# Patient Record
Sex: Male | Born: 1940 | Hispanic: No | Marital: Married | Smoking: Never smoker
Health system: Southern US, Community
[De-identification: ages and names within clinical notes are randomized; demographics above are authoritative.]

## PROBLEM LIST (undated history)

## (undated) DIAGNOSIS — I219 Acute myocardial infarction, unspecified: Secondary | ICD-10-CM

## (undated) DIAGNOSIS — Z79899 Other long term (current) drug therapy: Secondary | ICD-10-CM

## (undated) DIAGNOSIS — N400 Enlarged prostate without lower urinary tract symptoms: Secondary | ICD-10-CM

## (undated) DIAGNOSIS — I2510 Atherosclerotic heart disease of native coronary artery without angina pectoris: Secondary | ICD-10-CM

## (undated) DIAGNOSIS — I429 Cardiomyopathy, unspecified: Secondary | ICD-10-CM

## (undated) DIAGNOSIS — I447 Left bundle-branch block, unspecified: Secondary | ICD-10-CM

## (undated) DIAGNOSIS — I480 Paroxysmal atrial fibrillation: Secondary | ICD-10-CM

## (undated) DIAGNOSIS — I5020 Unspecified systolic (congestive) heart failure: Secondary | ICD-10-CM

## (undated) DIAGNOSIS — M1990 Unspecified osteoarthritis, unspecified site: Secondary | ICD-10-CM

## (undated) DIAGNOSIS — Z8616 Personal history of COVID-19: Secondary | ICD-10-CM

## (undated) DIAGNOSIS — C679 Malignant neoplasm of bladder, unspecified: Secondary | ICD-10-CM

## (undated) DIAGNOSIS — H269 Unspecified cataract: Secondary | ICD-10-CM

## (undated) DIAGNOSIS — M1712 Unilateral primary osteoarthritis, left knee: Secondary | ICD-10-CM

## (undated) DIAGNOSIS — E039 Hypothyroidism, unspecified: Secondary | ICD-10-CM

## (undated) DIAGNOSIS — I509 Heart failure, unspecified: Secondary | ICD-10-CM

## (undated) DIAGNOSIS — I10 Essential (primary) hypertension: Secondary | ICD-10-CM

## (undated) DIAGNOSIS — R600 Localized edema: Secondary | ICD-10-CM

## (undated) DIAGNOSIS — F32A Depression, unspecified: Secondary | ICD-10-CM

## (undated) DIAGNOSIS — R001 Bradycardia, unspecified: Secondary | ICD-10-CM

## (undated) DIAGNOSIS — E785 Hyperlipidemia, unspecified: Secondary | ICD-10-CM

## (undated) DIAGNOSIS — R609 Edema, unspecified: Secondary | ICD-10-CM

## (undated) DIAGNOSIS — I700 Atherosclerosis of aorta: Secondary | ICD-10-CM

## (undated) DIAGNOSIS — K219 Gastro-esophageal reflux disease without esophagitis: Secondary | ICD-10-CM

## (undated) DIAGNOSIS — G479 Sleep disorder, unspecified: Secondary | ICD-10-CM

## (undated) DIAGNOSIS — E079 Disorder of thyroid, unspecified: Secondary | ICD-10-CM

## (undated) HISTORY — PX: TONSILLECTOMY: SUR1361

## (undated) HISTORY — DX: Unilateral primary osteoarthritis, left knee: M17.12

## (undated) HISTORY — DX: Heart failure, unspecified: I50.9

---

## 2005-08-23 ENCOUNTER — Ambulatory Visit: Payer: Self-pay | Admitting: Internal Medicine

## 2006-09-04 ENCOUNTER — Encounter (INDEPENDENT_AMBULATORY_CARE_PROVIDER_SITE_OTHER): Payer: Self-pay | Admitting: *Deleted

## 2006-09-04 ENCOUNTER — Ambulatory Visit (HOSPITAL_BASED_OUTPATIENT_CLINIC_OR_DEPARTMENT_OTHER): Admission: RE | Admit: 2006-09-04 | Discharge: 2006-09-04 | Payer: Self-pay | Admitting: Urology

## 2006-09-07 ENCOUNTER — Emergency Department (HOSPITAL_COMMUNITY): Admission: EM | Admit: 2006-09-07 | Discharge: 2006-09-07 | Payer: Self-pay | Admitting: Emergency Medicine

## 2006-12-19 DIAGNOSIS — C679 Malignant neoplasm of bladder, unspecified: Secondary | ICD-10-CM

## 2006-12-19 HISTORY — DX: Malignant neoplasm of bladder, unspecified: C67.9

## 2007-07-14 ENCOUNTER — Emergency Department: Payer: Self-pay | Admitting: Emergency Medicine

## 2007-07-14 ENCOUNTER — Other Ambulatory Visit: Payer: Self-pay

## 2007-09-07 ENCOUNTER — Ambulatory Visit (HOSPITAL_BASED_OUTPATIENT_CLINIC_OR_DEPARTMENT_OTHER): Admission: RE | Admit: 2007-09-07 | Discharge: 2007-09-07 | Payer: Self-pay | Admitting: Urology

## 2007-09-07 ENCOUNTER — Encounter (INDEPENDENT_AMBULATORY_CARE_PROVIDER_SITE_OTHER): Payer: Self-pay | Admitting: Urology

## 2008-01-03 ENCOUNTER — Ambulatory Visit: Payer: Self-pay | Admitting: Gastroenterology

## 2008-04-14 ENCOUNTER — Ambulatory Visit: Payer: Self-pay

## 2011-05-03 NOTE — Op Note (Signed)
Nathaniel Ibarra, Nathaniel Ibarra             ACCOUNT NO.:  [id number]   MEDICAL RECORD NO.:  [id number]          PATIENT TYPE:  AMB   LOCATION:  [facility]                         FACILITY:  [facility]   PHYSICIAN:  Sigmund I. Patsi Sears, M.D.DATE OF BIRTH:  [birth year]   DATE OF PROCEDURE:  09/07/2007  DATE OF DISCHARGE:                               OPERATIVE REPORT   PREOPERATIVE DIAGNOSIS:  Elevated PSA.   POSTOPERATIVE DIAGNOSIS:  Elevated PSA.   PROCEDURE PERFORMED:  Saturation biopsy with transrectal ultrasound.   SURGEON:  Sigmund I. Patsi Sears, M.D.   ASSISTANT:  Tarri Glenn, M.D.   ANESTHESIA:  General.   INDICATIONS FOR PROCEDURE:  Nathaniel Ibarra is a 70 year old male with an  elevated PSA.  His most recent measurement is 5.5.  He has had negative  biopsies.  He presents for saturation biopsy.   DESCRIPTION OF PROCEDURE:  The patient was brought to the operating  room.  He was identified by his arm band, consent was verified, and  preoperative time out was performed.  After the induction of anesthesia,  he was placed in the dorsal lithotomy position.  Perioperative  antibiotics were administered.  The scrotum was tacked to the inguinal  region exposing the perineum which was prepped.  A Foley catheter was  inserted and the bladder was drained and then it was clamped.  The  perineum was prepped and draped in usual fashion.  The brachytherapy  grid was then mounted to the table and placed flush against the  patient's perineum.  The transrectal ultrasound probe was inserted into  the patient's rectum.  I then used a Tru-Cut biopsy to take systematic  saturation biopsy.  This was done in an anterior and a posterior plane  and from both right and left sides, we took a lateral base, base,  lateral mid, mid, lateral apex, apex.  This resulted in 24 total  biopsies.  These were all sent separately for permanent pathology.  At  this time, the procedure was terminated.  The patient tolerated  the  procedure well and there were no complication.  Dr. Jethro Bolus  was the attending primary and responsible physician and was present and  participated in all aspects.     ______________________________  Tarri Glenn, M.D.      Sigmund I. Patsi Sears, M.D.  Electronically Signed   JR/MEDQ  D:  09/07/2007  T:  09/08/2007  Job:  [id number]

## 2011-05-06 NOTE — Op Note (Signed)
Nathaniel Ibarra, Nathaniel Ibarra             ACCOUNT NO.:  [id number]   MEDICAL RECORD NO.:  [id number]          PATIENT TYPE:  AMB   LOCATION:  [facility]                         FACILITY:  [facility]   PHYSICIAN:  Sigmund I. Patsi Sears, M.D.DATE OF BIRTH:  1941-05-15   DATE OF PROCEDURE:  09/04/2006  DATE OF DISCHARGE:                                 OPERATIVE REPORT   PREOPERATIVE DIAGNOSES:  1. Minimal bladder cancer with microhematuria.  2. PSA elevation.   OPERATIONS:  Cystourethroscopy, transurethral resection of bladder tumor,  and transurethral needle biopsy of the prostate.   SURGEON:  Dr. Patsi Sears.   ANESTHESIA:  General LMA.   PREPARATION:  Appropriate preanesthesia, the patient was brought to the  operating room, placed on the operating room in dorsal supine position where  general LMA anesthesia was introduced.  The patient was placed in left  lateral decubitus position, where the prostate was ultrasounded and  biopsied.  The ultrasound showed the patient's prostate size to be 90 mL.  Rectal wall and seminal vesicles were negative for tumor involvement.   Twelve pieces of tissue were taken and sent to laboratory for evaluation.  No bleeding was noted.   It is noted the patient's PSA has elevated from 4.5 in 2005, to 5.2 in 2006,  to 5.5 in 2007.  He has had negative prostate biopsies in the past.   The patient was then repositioned in the dorsal lithotomy position where the  pubis was prepped with Betadine solution and draped in usual fashion.   REVIEW OF HISTORY:  Shows that this patient was originally evaluated for  elevated PSA, but during this evaluation, cystourethroscopy was  accomplished, and was found to have right lateral bladder wall bladder  cancer.  He is now for TURBT.   PROCEDURE:  Cystourethroscopy was accomplished, the right lateral bladder  wall tumor was identified normal.  Transurethral resection of bladder wall  bladder tumor was accomplished, by pressing  down on the bladder off from  above, and adjusting the inflow and suction on the continuous flow scope to  bring the upper right lateral wall into  position for resection.  No bleeding was noted.  Because of a rather large  area of resection, a #22 Foley with 5 mL balloon was placed for 24 hours.  The catheter should be removed tomorrow morning.  The patient will hold his  aspirin.      Sigmund I. Patsi Sears, M.D.  Electronically Signed     SIT/MEDQ  D:  09/04/2006  T:  09/05/2006  Job:  [id number]

## 2011-09-29 LAB — I-STAT 8, (EC8 V) (CONVERTED LAB)
Acid-Base Excess: 1
Bicarbonate: 25.3 — ABNORMAL HIGH
HCT: 46
Operator id: 114531
Sodium: 139
pCO2, Ven: 38.7 — ABNORMAL LOW

## 2014-12-19 DIAGNOSIS — I214 Non-ST elevation (NSTEMI) myocardial infarction: Secondary | ICD-10-CM

## 2014-12-19 HISTORY — DX: Non-ST elevation (NSTEMI) myocardial infarction: I21.4

## 2015-08-16 ENCOUNTER — Observation Stay
Admission: EM | Admit: 2015-08-16 | Discharge: 2015-08-17 | Disposition: A | Discharge status: Home / Self Care | Payer: PPO | Attending: Internal Medicine | Admitting: Internal Medicine

## 2015-08-16 ENCOUNTER — Encounter: Payer: Self-pay | Admitting: Emergency Medicine

## 2015-08-16 ENCOUNTER — Emergency Department: Payer: PPO

## 2015-08-16 DIAGNOSIS — Z79899 Other long term (current) drug therapy: Secondary | ICD-10-CM | POA: Diagnosis not present

## 2015-08-16 DIAGNOSIS — R61 Generalized hyperhidrosis: Secondary | ICD-10-CM | POA: Insufficient documentation

## 2015-08-16 DIAGNOSIS — I10 Essential (primary) hypertension: Secondary | ICD-10-CM | POA: Insufficient documentation

## 2015-08-16 DIAGNOSIS — E039 Hypothyroidism, unspecified: Secondary | ICD-10-CM | POA: Insufficient documentation

## 2015-08-16 DIAGNOSIS — I071 Rheumatic tricuspid insufficiency: Secondary | ICD-10-CM | POA: Insufficient documentation

## 2015-08-16 DIAGNOSIS — R079 Chest pain, unspecified: Secondary | ICD-10-CM | POA: Diagnosis present

## 2015-08-16 DIAGNOSIS — I371 Nonrheumatic pulmonary valve insufficiency: Secondary | ICD-10-CM | POA: Diagnosis not present

## 2015-08-16 DIAGNOSIS — Z8551 Personal history of malignant neoplasm of bladder: Secondary | ICD-10-CM | POA: Diagnosis not present

## 2015-08-16 DIAGNOSIS — N400 Enlarged prostate without lower urinary tract symptoms: Secondary | ICD-10-CM | POA: Insufficient documentation

## 2015-08-16 DIAGNOSIS — Z7982 Long term (current) use of aspirin: Secondary | ICD-10-CM | POA: Diagnosis not present

## 2015-08-16 DIAGNOSIS — I447 Left bundle-branch block, unspecified: Secondary | ICD-10-CM | POA: Diagnosis present

## 2015-08-16 DIAGNOSIS — Z825 Family history of asthma and other chronic lower respiratory diseases: Secondary | ICD-10-CM | POA: Diagnosis not present

## 2015-08-16 HISTORY — DX: Essential (primary) hypertension: I10

## 2015-08-16 HISTORY — DX: Disorder of thyroid, unspecified: E07.9

## 2015-08-16 HISTORY — DX: Malignant neoplasm of bladder, unspecified: C67.9

## 2015-08-16 HISTORY — DX: Benign prostatic hyperplasia without lower urinary tract symptoms: N40.0

## 2015-08-16 LAB — CBC
HEMATOCRIT: 42.5 % (ref 40.0–52.0)
HEMOGLOBIN: 14.4 g/dL (ref 13.0–18.0)
MCH: 31.6 pg (ref 26.0–34.0)
MCHC: 33.9 g/dL (ref 32.0–36.0)
MCV: 93.3 fL (ref 80.0–100.0)
Platelets: 320 10*3/uL (ref 150–440)
RBC: 4.56 MIL/uL (ref 4.40–5.90)
RDW: 12.7 % (ref 11.5–14.5)
WBC: 7.8 10*3/uL (ref 3.8–10.6)

## 2015-08-16 LAB — BASIC METABOLIC PANEL
ANION GAP: 7 (ref 5–15)
BUN: 20 mg/dL (ref 6–20)
CHLORIDE: 108 mmol/L (ref 101–111)
CO2: 26 mmol/L (ref 22–32)
Calcium: 8.7 mg/dL — ABNORMAL LOW (ref 8.9–10.3)
Creatinine, Ser: 0.83 mg/dL (ref 0.61–1.24)
GFR calc non Af Amer: >60 mL/min (ref >60)
Glucose, Bld: 127 mg/dL — ABNORMAL HIGH (ref 65–99)
POTASSIUM: 3.6 mmol/L (ref 3.5–5.1)
SODIUM: 141 mmol/L (ref 135–145)

## 2015-08-16 LAB — TROPONIN I: Troponin I: <0.03 ng/mL (ref <0.031)

## 2015-08-16 MED ORDER — ONDANSETRON HCL 4 MG PO TABS: 4.0000 mg | ORAL_TABLET | Freq: Four times a day (QID) | ORAL | Status: DC | PRN

## 2015-08-16 MED ORDER — NITROGLYCERIN 0.4 MG SL SUBL: 0.4000 mg | SUBLINGUAL_TABLET | SUBLINGUAL | Status: DC | PRN

## 2015-08-16 MED ORDER — MORPHINE SULFATE (PF) 2 MG/ML IV SOLN: 2.0000 mg | INTRAVENOUS | Status: DC | PRN

## 2015-08-16 MED ORDER — ASPIRIN EC 81 MG PO TBEC
81.0000 mg | DELAYED_RELEASE_TABLET | Freq: Every day | ORAL | Status: DC
  Administered 2015-08-17: 81 mg via ORAL
  Filled 2015-08-16: qty 1

## 2015-08-16 MED ORDER — FINASTERIDE 5 MG PO TABS
5.0000 mg | ORAL_TABLET | Freq: Every day | ORAL | Status: DC
  Administered 2015-08-17: 5 mg via ORAL
  Filled 2015-08-16: qty 1

## 2015-08-16 MED ORDER — ASPIRIN 81 MG PO CHEW
324.0000 mg | CHEWABLE_TABLET | Freq: Once | ORAL | Status: AC
  Administered 2015-08-16: 324 mg via ORAL
  Filled 2015-08-16: qty 4

## 2015-08-16 MED ORDER — ACETAMINOPHEN 325 MG PO TABS
650.0000 mg | ORAL_TABLET | Freq: Four times a day (QID) | ORAL | Status: DC | PRN
  Administered 2015-08-17: 650 mg via ORAL
  Filled 2015-08-16: qty 2

## 2015-08-16 MED ORDER — LEVOTHYROXINE SODIUM 50 MCG PO TABS
50.0000 ug | ORAL_TABLET | ORAL | Status: DC
  Administered 2015-08-17: 50 ug via ORAL
  Filled 2015-08-16: qty 1

## 2015-08-16 MED ORDER — LOSARTAN POTASSIUM 25 MG PO TABS
25.0000 mg | ORAL_TABLET | Freq: Every day | ORAL | Status: DC
  Administered 2015-08-17: 25 mg via ORAL
  Filled 2015-08-16: qty 1

## 2015-08-16 MED ORDER — TAMSULOSIN HCL 0.4 MG PO CAPS
0.4000 mg | ORAL_CAPSULE | Freq: Every day | ORAL | Status: DC
  Administered 2015-08-17: 0.4 mg via ORAL
  Filled 2015-08-16: qty 1

## 2015-08-16 MED ORDER — INFLUENZA VAC SPLIT QUAD 0.5 ML IM SUSY: 0.5000 mL | PREFILLED_SYRINGE | INTRAMUSCULAR | Status: DC

## 2015-08-16 MED ORDER — ENOXAPARIN SODIUM 40 MG/0.4ML ~~LOC~~ SOLN
40.0000 mg | SUBCUTANEOUS | Status: DC
Start: 2015-08-16 — End: 2015-08-17
  Administered 2015-08-16: 40 mg via SUBCUTANEOUS
  Filled 2015-08-16: qty 0.4

## 2015-08-16 MED ORDER — NITROGLYCERIN 2 % TD OINT
1.0000 [in_us] | TOPICAL_OINTMENT | Freq: Once | TRANSDERMAL | Status: AC
  Administered 2015-08-16: 1 [in_us] via TOPICAL
  Filled 2015-08-16: qty 1

## 2015-08-16 MED ORDER — AMLODIPINE BESYLATE 5 MG PO TABS
5.0000 mg | ORAL_TABLET | Freq: Every day | ORAL | Status: DC
  Administered 2015-08-17: 5 mg via ORAL
  Filled 2015-08-16: qty 1

## 2015-08-16 MED ORDER — ACETAMINOPHEN 650 MG RE SUPP: 650.0000 mg | Freq: Four times a day (QID) | RECTAL | Status: DC | PRN

## 2015-08-16 MED ORDER — ONDANSETRON HCL 4 MG/2ML IJ SOLN: 4.0000 mg | Freq: Four times a day (QID) | INTRAMUSCULAR | Status: DC | PRN

## 2015-08-16 MED ORDER — PNEUMOCOCCAL VAC POLYVALENT 25 MCG/0.5ML IJ INJ: 0.5000 mL | INJECTION | INTRAMUSCULAR | Status: DC

## 2015-08-16 MED ORDER — SERTRALINE HCL 50 MG PO TABS
25.0000 mg | ORAL_TABLET | Freq: Every day | ORAL | Status: DC
  Administered 2015-08-17: 25 mg via ORAL
  Filled 2015-08-16: qty 1

## 2015-08-16 MED ORDER — SODIUM CHLORIDE 0.9 % IJ SOLN
3.0000 mL | Freq: Two times a day (BID) | INTRAMUSCULAR | Status: DC
  Administered 2015-08-16 – 2015-08-17 (×2): 3 mL via INTRAVENOUS

## 2015-08-16 NOTE — ED Notes (Signed)
States he developed pain to mid chest about 1 weeks ago   Pain increased with inspiration and palpation

## 2015-08-16 NOTE — ED Provider Notes (Signed)
[facility] Emergency Department Provider Note     Time seen: ----------------------------------------- 1:51 PM on 08/16/2015 -----------------------------------------    I have reviewed the triage vital signs and the nursing notes.   HISTORY  Chief Complaint Chest Pain    HPI Nathaniel Ibarra is a 74 y.o. male who presents ER with mild left-sided chest pain is started about a week ago. Patient thinks it was maybe from lifting a calf. Patient describes pain with inspiration and palpation of the chest. Sometimes the pain is aggravated by pulling things with his left arm. At times he felt like he couldn't breathe very well and that he was flushed. He has not had this happen to him before. He has never had a heart attack   Past Medical History  Diagnosis Date  . Hypertension   . Thyroid disease   . Cancer     There are no active problems to display for this patient.   History reviewed. No pertinent past surgical history.  Allergies Review of patient's allergies indicates no known allergies.  Social History Social History  Substance Use Topics  . Smoking status: Never Smoker   . Smokeless tobacco: None  . Alcohol Use: No    Review of Systems Constitutional: Negative for fever. Eyes: Negative for visual changes. ENT: Negative for sore throat. Cardiovascular: Positive for chest pain Respiratory: Positive for pain when he breathes Gastrointestinal: Negative for abdominal pain, vomiting and diarrhea. Genitourinary: Negative for dysuria. Musculoskeletal: Negative for back pain. Skin: Negative for rash. Neurological: Negative for headaches, focal weakness or numbness.  10-point ROS otherwise negative.  ____________________________________________   PHYSICAL EXAM:  VITAL SIGNS: ED Triage Vitals  Enc Vitals Group     BP 08/16/15 1249 134/64 mmHg     Pulse Rate 08/16/15 1249 65     Resp 08/16/15 1249 20     Temp 08/16/15 1249 98.1 F  (36.7 C)     Temp Source 08/16/15 1249 Oral     SpO2 08/16/15 1249 96 %     Weight 08/16/15 1249 200 lb (90.719 kg)     Height 08/16/15 1249 6' (1.829 m)     Head Cir --      Peak Flow --      Pain Score 08/16/15 1250 3     Pain Loc --      Pain Edu? --      Excl. in [place]? --     Constitutional: Alert and oriented. Well appearing and in no distress. Eyes: Conjunctivae are normal. PERRL. Normal extraocular movements. ENT   Head: Normocephalic and atraumatic.   Nose: No congestion/rhinnorhea.   Mouth/Throat: Mucous membranes are moist.   Neck: No stridor. Cardiovascular: Normal rate, regular rhythm. Normal and symmetric distal pulses are present in all extremities. No murmurs, rubs, or gallops. Respiratory: Normal respiratory effort without tachypnea nor retractions. Breath sounds are clear and equal bilaterally. No wheezes/rales/rhonchi. Gastrointestinal: Soft and nontender. No distention. No abdominal bruits.  Musculoskeletal: Nontender with normal range of motion in all extremities. No joint effusions.  No lower extremity tenderness nor edema. Neurologic:  Normal speech and language. No gross focal neurologic deficits are appreciated. Speech is normal. No gait instability. Skin:  Skin is warm, dry and intact. No rash noted. Psychiatric: Mood and affect are normal. Speech and behavior are normal. Patient exhibits appropriate insight and judgment. ____________________________________________  EKG: Interpreted by me. Normal sinus rhythm, left axis deviation, left bundle branch block. Rate is 65 bpm  ____________________________________________  ED COURSE:  Pertinent labs & imaging results that were available during my care of the patient were reviewed by me and considered in my medical decision making (see chart for details). Patient is in no acute distress, will check cardiac labs and chest x-ray. ____________________________________________    LABS (pertinent  positives/negatives)  Labs Reviewed  BASIC METABOLIC PANEL - Abnormal; Notable for the following:    Glucose, Bld 127 (*)    Calcium 8.7 (*)    All other components within normal limits  TROPONIN I  CBC    RADIOLOGY  Chest x-ray FINDINGS: The heart size and mediastinal contours are within normal limits. Both lungs are clear. The visualized skeletal structures are unremarkable.  IMPRESSION: No active cardiopulmonary disease. ____________________________________________  FINAL ASSESSMENT AND PLAN  Chest pain, new left bundle branch block  Plan: Patient with labs and imaging as dictated above. Patient does describe exertional symptoms. He states "here lately" he has chest pain and difficulty breathing whenever he exerts himself heavily. The symptoms in conjunction with his new left bundle branch block will be worrisome for acute coronary syndrome or unstable angina. He'll need to be admitted and monitored with likely stress testing tomorrow.   Earleen Newport, MD   Earleen Newport, MD 08/16/15 [phone]

## 2015-08-16 NOTE — H&P (Signed)
[place] [facility] [place] NAME: Nathaniel Ibarra    MR#:  [id number]  DATE OF BIRTH:  1940-12-25  DATE OF ADMISSION:  08/16/2015  PRIMARY CARE PHYSICIAN: Albina Billet, MD   REQUESTING/REFERRING PHYSICIAN: Dr. Lenise Arena  CHIEF COMPLAINT:   Chief Complaint  Patient presents with  . Chest Pain    HISTORY OF PRESENT ILLNESS:  Nathaniel Ibarra  is a 74 y.o. male with a known history of hypertension, bladder cancer, hypothyroidism, BPH who presents to the hospital with chest pain ongoing for the past week. He describes his chest pain as being worse with deep inspiration and also with moving his left arm. It is nonradiating and not associated with shortness of breath. Patient did so that a few times she became diaphoretic with the chest pain. He denies any dizziness, palpitations, syncope. He presented to the ER and was noted to have an EKG showing a left bundle branch block. His previous EKG in 2008 did not show that. Patient cannot recall ever being told that he had abnormal EKG. Given his chest pain is new EKG findings hospitalist services were contacted further treatment and evaluation.  PAST MEDICAL HISTORY:   Past Medical History  Diagnosis Date  . Hypertension   . Thyroid disease   . BPH (benign prostatic hyperplasia)   . Bladder cancer     PAST SURGICAL HISTORY:  History reviewed. No pertinent past surgical history.  SOCIAL HISTORY:   Social History  Substance Use Topics  . Smoking status: Never Smoker   . Smokeless tobacco: Not on file  . Alcohol Use: No    FAMILY HISTORY:   Family History  Problem Relation Age of Onset  . Emphysema Mother   . Emphysema Father     DRUG ALLERGIES:  Not on File  REVIEW OF SYSTEMS:   Review of Systems  Constitutional: Negative for fever and weight loss.  HENT: Negative for congestion, nosebleeds and tinnitus.   Eyes: Negative for blurred vision, double vision and redness.   Respiratory: Negative for cough, hemoptysis and shortness of breath.   Cardiovascular: Positive for chest pain. Negative for orthopnea, leg swelling and PND.  Gastrointestinal: Negative for nausea, vomiting, abdominal pain, diarrhea and melena.  Genitourinary: Negative for dysuria, urgency and hematuria.  Musculoskeletal: Negative for joint pain and falls.  Neurological: Negative for dizziness, tingling, sensory change, focal weakness, seizures, weakness and headaches.  Endo/Heme/Allergies: Negative for polydipsia. Does not bruise/bleed easily.  Psychiatric/Behavioral: Negative for depression and memory loss. The patient is not nervous/anxious.     MEDICATIONS AT HOME:   Prior to Admission medications   Medication Sig Start Date End Date Taking? Authorizing Provider  amLODipine (NORVASC) 5 MG tablet Take 5 mg by mouth daily. 05/09/15  Yes Historical Provider, MD  aspirin 81 MG chewable tablet Chew 81 mg by mouth daily.   Yes Historical Provider, MD  finasteride (PROSCAR) 5 MG tablet Take 5 mg by mouth daily. 08/03/15  Yes Historical Provider, MD  levothyroxine (SYNTHROID, LEVOTHROID) 50 MCG tablet Take 50 mcg by mouth every morning. 07/12/15  Yes Historical Provider, MD  losartan (COZAAR) 50 MG tablet Take 25 mg by mouth daily.   Yes Historical Provider, MD  sertraline (ZOLOFT) 25 MG tablet Take 25 mg by mouth daily. 08/01/15  Yes Historical Provider, MD  tamsulosin (FLOMAX) 0.4 MG CAPS capsule Take 0.4 mg by mouth daily. 08/01/15  Yes Historical Provider, MD      VITAL SIGNS:  Blood  pressure 149/69, pulse 58, temperature 98.1 F (36.7 C), temperature source Oral, resp. rate 16, height 6' (1.829 m), weight 90.719 kg (200 lb), SpO2 94 %.  PHYSICAL EXAMINATION:  Physical Exam  GENERAL:  74 y.o.-year-old patient lying in the bed with no acute distress.  EYES: Pupils equal, round, reactive to light and accommodation. No scleral icterus. Extraocular muscles intact.  HEENT: Head atraumatic,  normocephalic. Oropharynx and nasopharynx clear. No oropharyngeal erythema, moist oral mucosa.    NECK:  Supple, no jugular venous distention. No thyroid enlargement, no tenderness.  LUNGS: Normal breath sounds bilaterally, no wheezing, rales, rhonchi. No use of accessory muscles of respiration.  CARDIOVASCULAR: S1, S2 RRR. No murmurs, rubs, gallops, clicks.  ABDOMEN: Soft, nontender, nondistended. Bowel sounds present. No organomegaly or mass.  EXTREMITIES: No pedal edema, cyanosis, or clubbing. + 2 pedal & radial pulses b/l.   NEUROLOGIC: Cranial nerves II through XII are intact. No focal Motor or sensory deficits appreciated b/l PSYCHIATRIC: The patient is alert and oriented x 3. Good affect.  SKIN: No obvious rash, lesion, or ulcer.   LABORATORY PANEL:   CBC  Recent Labs Lab 08/16/15 1304  WBC 7.8  HGB 14.4  HCT 42.5  PLT 320   ------------------------------------------------------------------------------------------------------------------  Chemistries   Recent Labs Lab 08/16/15 1304  NA 141  K 3.6  CL 108  CO2 26  GLUCOSE 127*  BUN 20  CREATININE 0.83  CALCIUM 8.7*   ------------------------------------------------------------------------------------------------------------------  Cardiac Enzymes  Recent Labs Lab 08/16/15 1304  TROPONINI <0.03   ------------------------------------------------------------------------------------------------------------------  RADIOLOGY:  Dg Chest 2 View  08/16/2015   CLINICAL DATA:  Chest pain for 1 week.  EXAM: CHEST  2 VIEW  COMPARISON:  07/14/2007  FINDINGS: The heart size and mediastinal contours are within normal limits. Both lungs are clear. The visualized skeletal structures are unremarkable.  IMPRESSION: No active cardiopulmonary disease.   Electronically Signed   By: Rolm Baptise M.D.   On: 08/16/2015 13:28     IMPRESSION AND PLAN:   74 year old male with past medical history of hypertension, BPH, bladder  cancer, follows him who presents to the hospital due to chest pain and noted to have a abnormal EKG with left bundle branch block.  #1 chest pain with abnormal EKG-patient does have risk factors given his history of hypertension and now that he is an abnormal EKG with a left bundle branch block. Patient denies ever being told that he had abnormal EKG. -Patient's chest pain although has not for atypical for angina as it's reproducible and worse with inspiration and movement. -For now I'll observe the patient on telemetry, follow also cardiac markers, get a two-dimensional echocardiogram, cardiology consult. -Continue aspirin, nitroglycerin as needed, check lipid profile.  #2 hypertension-continue Norvasc, losartan.  #3 BPH-continue Flomax, finasteride  #4 depression-continue Zoloft    All the records are reviewed and case discussed with ED provider. Management plans discussed with the patient, family and they are in agreement.  CODE STATUS: Full  TOTAL TIME TAKING CARE OF THIS PATIENT: 45 minutes.    Henreitta Leber M.D on 08/16/2015 at 3:18 PM  Between 7am to 6pm - Pager - [phone]  After 6pm go to [email or web address] - password [email or web address] [facility]  Office  [phone]  CC: Primary care physician; Albina Billet, MD

## 2015-08-16 NOTE — Progress Notes (Signed)
Patient arrived to 2A Room 237. Patient denies pain and all questions answered. Patient oriented to unit and Fall Safety Plan signed. Skin assessment completed with Elna Breslow RN. Nursing staff will continue to monitor. Earleen Reaper, RN

## 2015-08-17 ENCOUNTER — Observation Stay
Admit: 2015-08-17 | Discharge: 2015-08-17 | Disposition: A | Discharge status: Home / Self Care | Payer: PPO | Attending: Specialist | Admitting: Specialist

## 2015-08-17 LAB — BASIC METABOLIC PANEL
ANION GAP: 6 (ref 5–15)
BUN: 22 mg/dL — ABNORMAL HIGH (ref 6–20)
CO2: 26 mmol/L (ref 22–32)
Calcium: 8.5 mg/dL — ABNORMAL LOW (ref 8.9–10.3)
Chloride: 108 mmol/L (ref 101–111)
Creatinine, Ser: 0.95 mg/dL (ref 0.61–1.24)
GFR calc Af Amer: >60 mL/min (ref >60)
GLUCOSE: 149 mg/dL — AB (ref 65–99)
POTASSIUM: 3.6 mmol/L (ref 3.5–5.1)
Sodium: 140 mmol/L (ref 135–145)

## 2015-08-17 LAB — CBC
HCT: 38.8 % — ABNORMAL LOW (ref 40.0–52.0)
Hemoglobin: 13.1 g/dL (ref 13.0–18.0)
MCH: 31.5 pg (ref 26.0–34.0)
MCHC: 33.8 g/dL (ref 32.0–36.0)
MCV: 93.2 fL (ref 80.0–100.0)
PLATELETS: 281 10*3/uL (ref 150–440)
RBC: 4.16 MIL/uL — AB (ref 4.40–5.90)
RDW: 12.7 % (ref 11.5–14.5)
WBC: 7.9 10*3/uL (ref 3.8–10.6)

## 2015-08-17 LAB — LIPID PANEL
CHOLESTEROL: 158 mg/dL (ref 0–200)
HDL: 28 mg/dL — AB (ref >40)
LDL Cholesterol: 92 mg/dL (ref 0–99)
Total CHOL/HDL Ratio: 5.6 RATIO
Triglycerides: 188 mg/dL — ABNORMAL HIGH (ref <150)
VLDL: 38 mg/dL (ref 0–40)

## 2015-08-17 LAB — TROPONIN I: Troponin I: <0.03 ng/mL (ref <0.031)

## 2015-08-17 MED ORDER — IBUPROFEN 200 MG PO TABS: 200.0000 mg | ORAL_TABLET | Freq: Four times a day (QID) | ORAL | Status: DC | PRN

## 2015-08-17 NOTE — Care Management (Signed)
Patient admitted with chest pain.  Troponins are negative cardiology consult.   Patient presents from home and independent in all adls.   Has health insurance.  Denies issues accessing medical care, obtaining medications, maintaining housing, utilities and food.   No discharge needs identified at present time.

## 2015-08-17 NOTE — Progress Notes (Signed)
*  PRELIMINARY RESULTS* Echocardiogram 2D Echocardiogram has been performed.  Laqueta Jean Hege 08/17/2015, 8:36 AM

## 2015-08-17 NOTE — Consult Note (Signed)
History and Physical  Patient ID: Nathaniel Ibarra MRN: [id number] DOB/AGE: [birth year] 74 y.o. Admit date: 08/16/2015  Primary Care Physician: Albina Billet, MD Primary Cardiologist: none  HPI: This is a 74 YOm with PMHx HTN presented to ER with atypical CP, He states he was helping birth a calf last week and had to lift the animal. He felt sore hours afterward. He c/o pleuritic CP and chest wall is TTP.   Review of systems complete and found to be negative unless listed above  Past Medical History  Diagnosis Date  . Hypertension   . Thyroid disease   . BPH (benign prostatic hyperplasia)   . Bladder cancer     Family History  Problem Relation Age of Onset  . Emphysema Mother   . Emphysema Father     Social History   Social History  . Marital Status: Married    Spouse Name: N/A  . Number of Children: N/A  . Years of Education: N/A   Occupational History  . Not on file.   Social History Main Topics  . Smoking status: Never Smoker   . Smokeless tobacco: Not on file  . Alcohol Use: No  . Drug Use: No  . Sexual Activity: Not Currently   Other Topics Concern  . Not on file   Social History Narrative  . No narrative on file    History reviewed. No pertinent past surgical history.   Prescriptions prior to admission  Medication Sig Dispense Refill Last Dose  . amLODipine (NORVASC) 5 MG tablet Take 5 mg by mouth daily.  5 08/16/2015 at Unknown time  . aspirin 81 MG chewable tablet Chew 81 mg by mouth daily.   08/16/2015 at Unknown time  . finasteride (PROSCAR) 5 MG tablet Take 5 mg by mouth daily.  0 08/16/2015 at Unknown time  . levothyroxine (SYNTHROID, LEVOTHROID) 50 MCG tablet Take 50 mcg by mouth every morning.  3 08/16/2015 at Unknown time  . losartan (COZAAR) 50 MG tablet Take 25 mg by mouth daily.   08/16/2015 at Unknown time  . sertraline (ZOLOFT) 25 MG tablet Take 25 mg by mouth daily.  4 08/16/2015 at Unknown time  . tamsulosin (FLOMAX) 0.4 MG CAPS capsule Take  0.4 mg by mouth daily.  3 08/16/2015 at Unknown time    Physical Exam: Blood pressure 125/53, pulse 53, temperature 98.5 F (36.9 C), temperature source Oral, resp. rate 18, height 6' (1.829 m), weight 91.491 kg (201 lb 11.2 oz), SpO2 93 %.   General appearance: alert Labs:   Lab Results  Component Value Date   WBC 7.9 08/17/2015   HGB 13.1 08/17/2015   HCT 38.8* 08/17/2015   MCV 93.2 08/17/2015   PLT 281 08/17/2015    Recent Labs Lab 08/17/15 0105  NA 140  K 3.6  CL 108  CO2 26  BUN 22*  CREATININE 0.95  CALCIUM 8.5*  GLUCOSE 149*   Lab Results  Component Value Date   TROPONINI <0.03 08/17/2015    Lab Results  Component Value Date   CHOL 158 08/17/2015   Lab Results  Component Value Date   HDL 28* 08/17/2015   Lab Results  Component Value Date   LDLCALC 92 08/17/2015   Lab Results  Component Value Date   TRIG 188* 08/17/2015   Lab Results  Component Value Date   CHOLHDL 5.6 08/17/2015   No results found for: LDLDIRECT    Radiology: Dg Chest 2 View  08/16/2015   CLINICAL  DATA:  Chest pain for 1 week.  EXAM: CHEST  2 VIEW  COMPARISON:  07/14/2007  FINDINGS: The heart size and mediastinal contours are within normal limits. Both lungs are clear. The visualized skeletal structures are unremarkable.  IMPRESSION: No active cardiopulmonary disease.   Electronically Signed   By: Rolm Baptise M.D.   On: 08/16/2015 13:28    EKG: NSR 65 BPM LBBB  ASSESSMENT AND PLAN: pt with atypical CP, LBBB. EKG in 2008 LBBB was not present, concern is whether pt is having MI. However NSTEMI has r/o, LBBB is probably not related to acute MI, probably is due to conduction problem. Echo shows normal LVEF, but has septal hypokinesis which is probably due to LBBB. Thus advise outpatient NST which has been scheduled for 8/30 at 9am. Pt and pts wife are reliable to keep appt and can be discharged from cardiac stand point.   Signed: Dionisio David MD, [facility] 08/17/2015, 11:06 AM

## 2015-08-17 NOTE — Progress Notes (Signed)
Patient given discharge teaching and paperwork regarding medications, diet, follow-up appointments and activity. Patient understanding verbalized. No complaints at this time. IV and telemetry discontinued prior to leaving. Skin assessment as previously charted and vitals are stable; on room air. Patient being discharged to home. Wife present during discharge teaching. No further needs by Care Management. Ibuprofen e-prescribed.

## 2015-08-17 NOTE — Discharge Summary (Addendum)
Nathaniel Ibarra, is a 74 y.o. male  DOB 12-04-1941  MRN [id number].  Admission date:  08/16/2015  Admitting Physician  Henreitta Leber, MD  Discharge Date:  08/17/2015   Primary MD  Albina Billet, MD  Recommendations for primary care physician for things to follow:   Follow-up with Dr. Benita Stabile in 1 week.   Admission Diagnosis  Left bundle branch block [I44.7] Chest pain, unspecified chest pain type [R07.9]   Discharge Diagnosis  Left bundle branch block [I44.7] Chest pain, unspecified chest pain type [R07.9]  Active Problems:   Chest pain      Past Medical History  Diagnosis Date  . Hypertension   . Thyroid disease   . BPH (benign prostatic hyperplasia)   . Bladder cancer     History reviewed. No pertinent past surgical history.     History of present illness and  Hospital Course:     Kindly see H&P for history of present illness and admission details, please review complete Labs, Consult reports and Test reports for all details in brief  HPI  from the history and physical done on the day of admission  74 year old male patient with history of hypertension, bladder cancer, hypothyroidism came in because of for chest pain. Patient has no shortness of breath, but was diaphoretic. Patient EKG showed a left bundle branch block but the previous EKG was in 2008 after that he did not have any follow-ups. So we are not sure if these changes are old or new. Patient troponins are negative. Admitted to hospitalist service on telemetry for evaluation of chest pain.  Hospital Course    #1 chest pain; likely secondary to musculoskeletal pain patient says that he was lifting a calf and noticed pain after that. Troponins are negative, echocardiogram results are pending. Cardiology consult placed because of EKG showing  the left bundle branch block. Patient complains of chest pain only with deep breath, movement and it appears like muscular skeletal pain because of lifting the cow. Cardiology is planning to do a cardiac workup he'll be kept in hospital otherwise if this a patient can go home will discharge the patient home #2 hypertension controlled continue home medication #3 BPH continue Flomax History of hypothyroidism continue Synthyroid. Discharge Condition: Stable   Follow UP  Follow-up Information    Follow up with Albina Billet, MD.   Specialty:  Internal Medicine   Contact information:   [address]   [place] [address] [phone]         Discharge Instructions  and  Discharge Medications        Medication List    TAKE these medications        amLODipine 5 MG tablet  Commonly known as:  NORVASC  Take 5 mg by mouth daily.     aspirin 81 MG chewable tablet  Chew 81 mg by mouth daily.     finasteride 5 MG tablet  Commonly known as:  PROSCAR  Take 5 mg by mouth daily.     ibuprofen 200 MG tablet  Commonly known as:  MOTRIN IB  Take 1 tablet (200 mg total) by mouth every 6 (six) hours as needed.     levothyroxine 50 MCG tablet  Commonly known as:  SYNTHROID, LEVOTHROID  Take 50 mcg by mouth every morning.     losartan 50 MG tablet  Commonly known as:  COZAAR  Take 25 mg by mouth daily.     sertraline 25  MG tablet  Commonly known as:  ZOLOFT  Take 25 mg by mouth daily.     tamsulosin 0.4 MG Caps capsule  Commonly known as:  FLOMAX  Take 0.4 mg by mouth daily.          Diet and Activity recommendation: See Discharge Instructions above   Consults obtained - cardio   Major procedures and Radiology Reports - PLEASE review detailed and final reports for all details, in brief -      Dg Chest 2 View  08/16/2015   CLINICAL DATA:  Chest pain for 1 week.  EXAM: CHEST  2 VIEW  COMPARISON:  07/14/2007  FINDINGS: The heart size and mediastinal contours  are within normal limits. Both lungs are clear. The visualized skeletal structures are unremarkable.  IMPRESSION: No active cardiopulmonary disease.   Electronically Signed   By: Rolm Baptise M.D.   On: 08/16/2015 13:28    Micro Results     No results found for this or any previous visit (from the past 240 hour(s)).     Today   Subjective:   Nathaniel Ibarra today has no headache,no chest abdominal pain,no new weakness tingling or numbness, feels much better wants to go home today.   Objective:   Blood pressure 125/53, pulse 53, temperature 98.5 F (36.9 C), temperature source Oral, resp. rate 18, height 6' (1.829 m), weight 91.491 kg (201 lb 11.2 oz), SpO2 93 %.   Intake/Output Summary (Last 24 hours) at 08/17/15 1102 Last data filed at 08/17/15 0900  Gross per 24 hour  Intake    600 ml  Output     50 ml  Net    550 ml    Exam Awake Alert, Oriented x 3, No new F.N deficits, Normal affect .AT,PERRAL Supple Neck,No JVD, No cervical lymphadenopathy appriciated.  Symmetrical Chest wall movement, Good air movement bilaterally, CTAB RRR,No Gallops,Rubs or new Murmurs, No Parasternal Heave +ve B.Sounds, Abd Soft, Non tender, No organomegaly appriciated, No rebound -guarding or rigidity. No Cyanosis, Clubbing or edema, No new Rash or bruise  Data Review   CBC w Diff: Lab Results  Component Value Date   WBC 7.9 08/17/2015   HGB 13.1 08/17/2015   HCT 38.8* 08/17/2015   PLT 281 08/17/2015    CMP: Lab Results  Component Value Date   NA 140 08/17/2015   K 3.6 08/17/2015   CL 108 08/17/2015   CO2 26 08/17/2015   BUN 22* 08/17/2015   CREATININE 0.95 08/17/2015  .   Total Time in preparing paper work, data evaluation and todays exam - 63 minutes  Nathaniel Ibarra M.D on 08/17/2015 at 11:02 AM    Cardiology Dr. Celesta Gentile he recommended outpatient stress test which is scheduled for tomorrow at 8:30 AM at his office. Echocardiogram showed EF of 55% with  hypokinesia anteroseptal myocardium.

## 2015-09-01 ENCOUNTER — Encounter
Admission: AD | Disposition: A | Discharge status: Home / Self Care | Payer: Self-pay | Source: Ambulatory Visit | Attending: Cardiovascular Disease

## 2015-09-01 ENCOUNTER — Ambulatory Visit
Admission: AD | Admit: 2015-09-01 | Discharge: 2015-09-02 | Disposition: A | Discharge status: Home / Self Care | Payer: PPO | Source: Ambulatory Visit | Attending: Cardiovascular Disease | Admitting: Cardiovascular Disease

## 2015-09-01 DIAGNOSIS — I2510 Atherosclerotic heart disease of native coronary artery without angina pectoris: Secondary | ICD-10-CM | POA: Diagnosis not present

## 2015-09-01 DIAGNOSIS — Z8551 Personal history of malignant neoplasm of bladder: Secondary | ICD-10-CM | POA: Insufficient documentation

## 2015-09-01 DIAGNOSIS — I447 Left bundle-branch block, unspecified: Secondary | ICD-10-CM | POA: Diagnosis not present

## 2015-09-01 DIAGNOSIS — Z79899 Other long term (current) drug therapy: Secondary | ICD-10-CM | POA: Diagnosis not present

## 2015-09-01 DIAGNOSIS — N400 Enlarged prostate without lower urinary tract symptoms: Secondary | ICD-10-CM | POA: Diagnosis not present

## 2015-09-01 DIAGNOSIS — E039 Hypothyroidism, unspecified: Secondary | ICD-10-CM | POA: Diagnosis present

## 2015-09-01 DIAGNOSIS — Z825 Family history of asthma and other chronic lower respiratory diseases: Secondary | ICD-10-CM | POA: Insufficient documentation

## 2015-09-01 DIAGNOSIS — I10 Essential (primary) hypertension: Secondary | ICD-10-CM | POA: Diagnosis present

## 2015-09-01 DIAGNOSIS — Z7982 Long term (current) use of aspirin: Secondary | ICD-10-CM | POA: Diagnosis not present

## 2015-09-01 DIAGNOSIS — I249 Acute ischemic heart disease, unspecified: Secondary | ICD-10-CM | POA: Diagnosis present

## 2015-09-01 DIAGNOSIS — I200 Unstable angina: Secondary | ICD-10-CM | POA: Diagnosis present

## 2015-09-01 HISTORY — PX: CARDIAC CATHETERIZATION: SHX172

## 2015-09-01 SURGERY — LEFT HEART CATH
Anesthesia: Moderate Sedation

## 2015-09-01 MED ORDER — ASPIRIN 81 MG PO CHEW
CHEWABLE_TABLET | ORAL | Status: DC | PRN
  Administered 2015-09-01: 243 mg via ORAL

## 2015-09-01 MED ORDER — ACETAMINOPHEN 325 MG PO TABS: 650.0000 mg | ORAL_TABLET | ORAL | Status: DC | PRN

## 2015-09-01 MED ORDER — SODIUM CHLORIDE 0.9 % IV SOLN: 250.0000 mL | INTRAVENOUS | Status: DC | PRN

## 2015-09-01 MED ORDER — MIDAZOLAM HCL 2 MG/2ML IJ SOLN
INTRAMUSCULAR | Status: AC
  Filled 2015-09-01: qty 2

## 2015-09-01 MED ORDER — ASPIRIN 81 MG PO CHEW: 81.0000 mg | CHEWABLE_TABLET | ORAL | Status: DC

## 2015-09-01 MED ORDER — ASPIRIN EC 325 MG PO TBEC
325.0000 mg | DELAYED_RELEASE_TABLET | Freq: Every day | ORAL | Status: DC
  Administered 2015-09-02: 325 mg via ORAL
  Filled 2015-09-01: qty 1

## 2015-09-01 MED ORDER — CLOPIDOGREL BISULFATE 75 MG PO TABS
ORAL_TABLET | ORAL | Status: DC | PRN
  Administered 2015-09-01: 600 mg via ORAL

## 2015-09-01 MED ORDER — BIVALIRUDIN BOLUS VIA INFUSION - CUPID
INTRAVENOUS | Status: DC | PRN
  Administered 2015-09-01: 68.4 mg via INTRAVENOUS

## 2015-09-01 MED ORDER — SODIUM CHLORIDE 0.9 % WEIGHT BASED INFUSION: 1.0000 mL/kg/h | INTRAVENOUS | Status: DC

## 2015-09-01 MED ORDER — BIVALIRUDIN 250 MG IV SOLR
INTRAVENOUS | Status: AC
  Filled 2015-09-01: qty 250

## 2015-09-01 MED ORDER — HEPARIN (PORCINE) IN NACL 2-0.9 UNIT/ML-% IJ SOLN
INTRAMUSCULAR | Status: AC
  Filled 2015-09-01: qty 1000

## 2015-09-01 MED ORDER — ONDANSETRON HCL 4 MG/2ML IJ SOLN: 4.0000 mg | Freq: Four times a day (QID) | INTRAMUSCULAR | Status: DC | PRN

## 2015-09-01 MED ORDER — NITROGLYCERIN 5 MG/ML IV SOLN
INTRAVENOUS | Status: AC
  Filled 2015-09-01: qty 10

## 2015-09-01 MED ORDER — CLOPIDOGREL BISULFATE 75 MG PO TABS
75.0000 mg | ORAL_TABLET | Freq: Every day | ORAL | Status: DC
  Administered 2015-09-02: 75 mg via ORAL
  Filled 2015-09-01: qty 1

## 2015-09-01 MED ORDER — SODIUM CHLORIDE 0.9 % IJ SOLN: 3.0000 mL | Freq: Two times a day (BID) | INTRAMUSCULAR | Status: DC

## 2015-09-01 MED ORDER — SODIUM CHLORIDE 0.9 % IJ SOLN: 3.0000 mL | INTRAMUSCULAR | Status: DC | PRN

## 2015-09-01 MED ORDER — FENTANYL CITRATE (PF) 100 MCG/2ML IJ SOLN
INTRAMUSCULAR | Status: DC | PRN
  Administered 2015-09-01: 50 ug via INTRAVENOUS
  Administered 2015-09-01: 25 ug via INTRAVENOUS

## 2015-09-01 MED ORDER — FENTANYL CITRATE (PF) 100 MCG/2ML IJ SOLN
INTRAMUSCULAR | Status: AC
  Filled 2015-09-01: qty 2

## 2015-09-01 MED ORDER — CLOPIDOGREL BISULFATE 75 MG PO TABS: 75.0000 mg | ORAL_TABLET | Freq: Every day | ORAL | Status: DC

## 2015-09-01 MED ORDER — ASPIRIN 81 MG PO CHEW
CHEWABLE_TABLET | ORAL | Status: AC
  Filled 2015-09-01: qty 3

## 2015-09-01 MED ORDER — SODIUM CHLORIDE 0.9 % WEIGHT BASED INFUSION: 3.0000 mL/kg/h | INTRAVENOUS | Status: DC

## 2015-09-01 MED ORDER — SODIUM CHLORIDE 0.9 % IV SOLN
250.0000 mg | INTRAVENOUS | Status: DC | PRN
  Administered 2015-09-01: 1.75 mg/kg/h via INTRAVENOUS

## 2015-09-01 MED ORDER — CLOPIDOGREL BISULFATE 75 MG PO TABS
ORAL_TABLET | ORAL | Status: AC
  Filled 2015-09-01: qty 8

## 2015-09-01 MED ORDER — MIDAZOLAM HCL 2 MG/2ML IJ SOLN
INTRAMUSCULAR | Status: DC | PRN
  Administered 2015-09-01: 1 mg via INTRAVENOUS
  Administered 2015-09-01: 0.5 mg via INTRAVENOUS

## 2015-09-01 MED ORDER — ASPIRIN EC 325 MG PO TBEC: 325.0000 mg | DELAYED_RELEASE_TABLET | Freq: Every day | ORAL | Status: DC

## 2015-09-01 MED ORDER — NITROGLYCERIN 1 MG/10 ML FOR IR/CATH LAB
INTRA_ARTERIAL | Status: DC | PRN
  Administered 2015-09-01 (×2): 200 ug via INTRACORONARY

## 2015-09-01 MED ORDER — SODIUM CHLORIDE 0.9 % IV SOLN
INTRAVENOUS | Status: DC
  Administered 2015-09-01: 12:00:00 via INTRAVENOUS

## 2015-09-01 SURGICAL SUPPLY — 17 items
BALLN TREK RX 2.5X15 (BALLOONS) ×3
BALLOON TREK RX 2.5X15 (BALLOONS) IMPLANT
CATH INFINITI 5FR ANG PIGTAIL (CATHETERS) ×3 IMPLANT
CATH INFINITI 5FR JL4 (CATHETERS) ×3 IMPLANT
CATH INFINITI JR4 5F (CATHETERS) ×3 IMPLANT
CATH VISTA GUIDE 6FR XB3.5 (CATHETERS) ×2 IMPLANT
DEVICE CLOSURE MYNXGRIP 6/7F (Vascular Products) ×2 IMPLANT
DEVICE INFLAT 30 PLUS (MISCELLANEOUS) ×2 IMPLANT
KIT MANI 3VAL PERCEP (MISCELLANEOUS) ×3 IMPLANT
NDL PERC 18GX7CM (NEEDLE) ×1 IMPLANT
NEEDLE PERC 18GX7CM (NEEDLE) ×3 IMPLANT
PACK CARDIAC CATH (CUSTOM PROCEDURE TRAY) ×3 IMPLANT
SHEATH AVANTI 6FR X 11CM (SHEATH) ×2 IMPLANT
SHEATH PINNACLE 5F 10CM (SHEATH) ×3 IMPLANT
STENT XIENCE ALPINE RX 2.75X15 (Permanent Stent) ×2 IMPLANT
WIRE EMERALD 3MM-J .035X150CM (WIRE) ×3 IMPLANT
WIRE G HI TQ BMW 190 (WIRE) ×2 IMPLANT

## 2015-09-01 NOTE — Discharge Instructions (Signed)
Coronary Angiogram °A coronary angiogram, also called coronary angiography, is an X-ray procedure used to look at the arteries in the heart. In this procedure, a dye (contrast dye) is injected through a long, hollow tube (catheter). The catheter is about the size of a piece of cooked spaghetti and is inserted through your groin, wrist, or arm. The dye is injected into each artery, and X-rays are then taken to show if there is a blockage in the arteries of your heart. °LET YOUR HEALTH CARE PROVIDER KNOW ABOUT: °· Any allergies you have, including allergies to shellfish or contrast dye.   °· All medicines you are taking, including vitamins, herbs, eye drops, creams, and over-the-counter medicines.   °· Previous problems you or members of your family have had with the use of anesthetics.   °· Any blood disorders you have.   °· Previous surgeries you have had. °· History of kidney problems or failure.   °· Other medical conditions you have. °RISKS AND COMPLICATIONS  °Generally, a coronary angiogram is a safe procedure. However, problems can occur and include: °· Allergic reaction to the dye. °· Bleeding from the access site or other locations. °· Kidney injury, especially in people with impaired kidney function.  °· Stroke (rare). °· Heart attack (rare). °BEFORE THE PROCEDURE  °· Do not eat or drink anything after midnight the night before the procedure or as directed by your health care provider.   °· Ask your health care provider about changing or stopping your regular medicines. This is especially important if you are taking diabetes medicines or blood thinners. °PROCEDURE °· You may be given a medicine to help you relax (sedative) before the procedure. This medicine is given through an intravenous (IV) access tube that is inserted into one of your veins.   °· The area where the catheter will be inserted will be washed and shaved. This is usually done in the groin but may be done in the fold of your arm (near your  elbow) or in the wrist.    °· A medicine will be given to numb the area where the catheter will be inserted (local anesthetic).   °· The health care provider will insert the catheter into an artery. The catheter will be guided by using a special type of X-ray (fluoroscopy) of the blood vessel being examined.   °· A special dye will then be injected into the catheter, and X-rays will be taken. The dye will help to show where any narrowing or blockages are located in the heart arteries.   °AFTER THE PROCEDURE  °· If the procedure is done through the leg, you will be kept in bed lying flat for several hours. You will be instructed to not bend or cross your legs. °· The insertion site will be checked frequently.   °· The pulse in your feet or wrist will be checked frequently.   °· Additional blood tests, X-rays, and an electrocardiogram may be done.   °Document Released: 06/11/2003 Document Revised: 04/21/2014 Document Reviewed: 04/29/2013 °Exit[employer]® Patient Information ©2015 [employer]. This information is not intended to replace advice given to you by your health care provider. Make sure you discuss any questions you have with your health care provider. ° °

## 2015-09-01 NOTE — Progress Notes (Signed)
Pt walked 7 laps around nursing station without complications or complaints/weakness.  Will continue to monitor. Nathaniel Ibarra

## 2015-09-01 NOTE — Progress Notes (Signed)
Patient alert and oriented x4. Oriented to room, unit, and call bell. Admission completed. No complaints at this time. Will cont to assess. Skin assessment verified by Merrilee Jansky, RN. Telemetry box verified. R groin cath site stable, no signs of bleeding or complications. Nathaniel Ibarra

## 2015-09-02 ENCOUNTER — Encounter: Payer: Self-pay | Admitting: Cardiovascular Disease

## 2015-09-02 DIAGNOSIS — I2510 Atherosclerotic heart disease of native coronary artery without angina pectoris: Secondary | ICD-10-CM | POA: Diagnosis not present

## 2015-09-02 MED ORDER — ASPIRIN 325 MG PO TBEC: 325.0000 mg | DELAYED_RELEASE_TABLET | Freq: Every day | ORAL | Status: DC

## 2015-09-02 MED ORDER — CLOPIDOGREL BISULFATE 75 MG PO TABS: 75.0000 mg | ORAL_TABLET | Freq: Once | ORAL | Status: DC

## 2015-09-02 NOTE — Progress Notes (Signed)
Pt discharged, vasc site clean dry, no pain, no hematoma, checked pt's BP and HR right before he left, IV sites DCd, bleeding controlled, tele monitor turned in, spouse verbalized understanding re follow up appt, scrips, and DC instructions, pt left hospital in car with his wife

## 2015-09-02 NOTE — Care Management (Signed)
Patient for discharge.  Will discharge on Plavix which will not be cost prohibitive

## 2015-09-02 NOTE — Discharge Summary (Signed)
  Patient ID: Nathaniel Ibarra MRN: [id number] DOB/AGE: 07/11/1941 74 y.o.  Admit date: 09/01/2015 Discharge date: 09/02/2015   Primary Discharge Diagnosis:  Coronary artery disease   Procedures:  Cardiac catheterization with subsequent PCI to LAD   Discharge Exam: Blood pressure 129/50, pulse 59, temperature 98.6 F (37 C), temperature source Oral, resp. rate 18, height 6' (1.829 m), weight 91.627 kg (202 lb), SpO2 94 %. Weight: 91.627 kg (202 lb)   Labs: CBC:   Lab Results  Component Value Date   WBC 7.9 08/17/2015   HGB 13.1 08/17/2015   HCT 38.8* 08/17/2015   MCV 93.2 08/17/2015   PLT 281 08/17/2015    CMP: No results for input(s): NA, K, CL, CO2, BUN, CREATININE, CALCIUM, PROT, BILITOT, ALKPHOS, ALT, AST, GLUCOSE in the last 168 hours.  Invalid input(s): LABALBU  Lipid Panel     Component Value Date/Time   CHOL 158 08/17/2015 0105   TRIG 188* 08/17/2015 0105   HDL 28* 08/17/2015 0105   CHOLHDL 5.6 08/17/2015 0105   VLDL 38 08/17/2015 0105   LDLCALC 92 08/17/2015 0105    Cardiac Enzymes: No results for input(s): CKTOTAL, CKMB, CKMBINDEX, TROPONINI in the last 72 hours.  BNP (last 3 results) No results for input(s): PROBNP in the last 8760 hours.  Protime: Invalid input(s): PT  Thyroid: No results found for: TSH, T3TOTAL, T4TOTAL, THYROIDAB  Hemoglobin A1C: No results found for: HGBA1C   Discharge Medications:   Medication List    STOP taking these medications        aspirin 81 MG chewable tablet  Replaced by:  aspirin 325 MG EC tablet      TAKE these medications        amLODipine 5 MG tablet  Commonly known as:  NORVASC  Take 5 mg by mouth daily.     aspirin 325 MG EC tablet  Take 1 tablet (325 mg total) by mouth daily.     clopidogrel 75 MG tablet  Commonly known as:  PLAVIX  Take 1 tablet (75 mg total) by mouth once.     finasteride 5 MG tablet  Commonly known as:  PROSCAR  Take 5 mg by mouth daily.     ibuprofen 200 MG  tablet  Commonly known as:  MOTRIN IB  Take 1 tablet (200 mg total) by mouth every 6 (six) hours as needed.     levothyroxine 50 MCG tablet  Commonly known as:  SYNTHROID, LEVOTHROID  Take 50 mcg by mouth every morning.     losartan 50 MG tablet  Commonly known as:  COZAAR  Take 25 mg by mouth daily.     sertraline 25 MG tablet  Commonly known as:  ZOLOFT  Take 25 mg by mouth daily.     tamsulosin 0.4 MG Caps capsule  Commonly known as:  FLOMAX  Take 0.4 mg by mouth daily.         Followup plans and appointments: follow up at [facility], Friday 9/16 at 10:30am.    Time spent with patient to include physician time:  30 minutes   Signed: Jasmine Pang, PA-C Jasmine Pang, PA-C

## 2016-01-29 DIAGNOSIS — J069 Acute upper respiratory infection, unspecified: Secondary | ICD-10-CM | POA: Diagnosis not present

## 2016-02-19 DIAGNOSIS — I10 Essential (primary) hypertension: Secondary | ICD-10-CM | POA: Diagnosis not present

## 2016-02-19 DIAGNOSIS — E785 Hyperlipidemia, unspecified: Secondary | ICD-10-CM | POA: Diagnosis not present

## 2016-02-22 DIAGNOSIS — I259 Chronic ischemic heart disease, unspecified: Secondary | ICD-10-CM | POA: Diagnosis not present

## 2016-02-22 DIAGNOSIS — E039 Hypothyroidism, unspecified: Secondary | ICD-10-CM | POA: Diagnosis not present

## 2016-02-22 DIAGNOSIS — I10 Essential (primary) hypertension: Secondary | ICD-10-CM | POA: Diagnosis not present

## 2016-04-11 DIAGNOSIS — F330 Major depressive disorder, recurrent, mild: Secondary | ICD-10-CM | POA: Diagnosis not present

## 2016-04-11 DIAGNOSIS — R531 Weakness: Secondary | ICD-10-CM | POA: Diagnosis not present

## 2016-04-11 DIAGNOSIS — R5383 Other fatigue: Secondary | ICD-10-CM | POA: Diagnosis not present

## 2016-04-14 DIAGNOSIS — I2510 Atherosclerotic heart disease of native coronary artery without angina pectoris: Secondary | ICD-10-CM | POA: Diagnosis not present

## 2016-04-14 DIAGNOSIS — E782 Mixed hyperlipidemia: Secondary | ICD-10-CM | POA: Diagnosis not present

## 2016-04-14 DIAGNOSIS — Z955 Presence of coronary angioplasty implant and graft: Secondary | ICD-10-CM | POA: Diagnosis not present

## 2016-04-14 DIAGNOSIS — I10 Essential (primary) hypertension: Secondary | ICD-10-CM | POA: Diagnosis not present

## 2016-04-15 DIAGNOSIS — E782 Mixed hyperlipidemia: Secondary | ICD-10-CM | POA: Diagnosis not present

## 2016-04-29 DIAGNOSIS — F418 Other specified anxiety disorders: Secondary | ICD-10-CM | POA: Diagnosis not present

## 2016-05-24 DIAGNOSIS — I2510 Atherosclerotic heart disease of native coronary artery without angina pectoris: Secondary | ICD-10-CM | POA: Diagnosis not present

## 2016-05-24 DIAGNOSIS — R110 Nausea: Secondary | ICD-10-CM | POA: Diagnosis not present

## 2016-06-14 DIAGNOSIS — R634 Abnormal weight loss: Secondary | ICD-10-CM | POA: Diagnosis not present

## 2016-06-14 DIAGNOSIS — I10 Essential (primary) hypertension: Secondary | ICD-10-CM | POA: Diagnosis not present

## 2016-06-14 DIAGNOSIS — I2510 Atherosclerotic heart disease of native coronary artery without angina pectoris: Secondary | ICD-10-CM | POA: Diagnosis not present

## 2016-06-14 DIAGNOSIS — R110 Nausea: Secondary | ICD-10-CM | POA: Diagnosis not present

## 2016-06-14 DIAGNOSIS — Z955 Presence of coronary angioplasty implant and graft: Secondary | ICD-10-CM | POA: Diagnosis not present

## 2016-06-15 DIAGNOSIS — L720 Epidermal cyst: Secondary | ICD-10-CM | POA: Diagnosis not present

## 2016-06-15 DIAGNOSIS — L820 Inflamed seborrheic keratosis: Secondary | ICD-10-CM | POA: Diagnosis not present

## 2016-06-15 DIAGNOSIS — L821 Other seborrheic keratosis: Secondary | ICD-10-CM | POA: Diagnosis not present

## 2016-06-15 DIAGNOSIS — D229 Melanocytic nevi, unspecified: Secondary | ICD-10-CM | POA: Diagnosis not present

## 2016-06-15 DIAGNOSIS — D1800 Hemangioma unspecified site: Secondary | ICD-10-CM | POA: Diagnosis not present

## 2016-06-15 DIAGNOSIS — L578 Other skin changes due to chronic exposure to nonionizing radiation: Secondary | ICD-10-CM | POA: Diagnosis not present

## 2016-06-15 DIAGNOSIS — D692 Other nonthrombocytopenic purpura: Secondary | ICD-10-CM | POA: Diagnosis not present

## 2016-06-15 DIAGNOSIS — L570 Actinic keratosis: Secondary | ICD-10-CM | POA: Diagnosis not present

## 2016-06-15 DIAGNOSIS — L918 Other hypertrophic disorders of the skin: Secondary | ICD-10-CM | POA: Diagnosis not present

## 2016-06-15 DIAGNOSIS — Z1283 Encounter for screening for malignant neoplasm of skin: Secondary | ICD-10-CM | POA: Diagnosis not present

## 2016-07-01 DIAGNOSIS — H43393 Other vitreous opacities, bilateral: Secondary | ICD-10-CM | POA: Diagnosis not present

## 2016-07-04 DIAGNOSIS — R079 Chest pain, unspecified: Secondary | ICD-10-CM | POA: Diagnosis not present

## 2016-07-14 DIAGNOSIS — I2510 Atherosclerotic heart disease of native coronary artery without angina pectoris: Secondary | ICD-10-CM | POA: Diagnosis not present

## 2016-07-14 DIAGNOSIS — R079 Chest pain, unspecified: Secondary | ICD-10-CM | POA: Diagnosis not present

## 2016-07-14 DIAGNOSIS — E782 Mixed hyperlipidemia: Secondary | ICD-10-CM | POA: Diagnosis not present

## 2016-07-15 DIAGNOSIS — R9430 Abnormal result of cardiovascular function study, unspecified: Secondary | ICD-10-CM | POA: Diagnosis not present

## 2016-07-15 DIAGNOSIS — I2510 Atherosclerotic heart disease of native coronary artery without angina pectoris: Secondary | ICD-10-CM | POA: Diagnosis not present

## 2016-07-21 DIAGNOSIS — E782 Mixed hyperlipidemia: Secondary | ICD-10-CM | POA: Diagnosis not present

## 2016-07-21 DIAGNOSIS — K219 Gastro-esophageal reflux disease without esophagitis: Secondary | ICD-10-CM | POA: Diagnosis not present

## 2016-07-21 DIAGNOSIS — I2510 Atherosclerotic heart disease of native coronary artery without angina pectoris: Secondary | ICD-10-CM | POA: Diagnosis not present

## 2016-07-27 DIAGNOSIS — I739 Peripheral vascular disease, unspecified: Secondary | ICD-10-CM | POA: Diagnosis not present

## 2016-07-27 DIAGNOSIS — I2510 Atherosclerotic heart disease of native coronary artery without angina pectoris: Secondary | ICD-10-CM | POA: Diagnosis not present

## 2016-07-29 DIAGNOSIS — I739 Peripheral vascular disease, unspecified: Secondary | ICD-10-CM | POA: Diagnosis not present

## 2016-08-04 DIAGNOSIS — I2510 Atherosclerotic heart disease of native coronary artery without angina pectoris: Secondary | ICD-10-CM | POA: Diagnosis not present

## 2016-08-04 DIAGNOSIS — I739 Peripheral vascular disease, unspecified: Secondary | ICD-10-CM | POA: Diagnosis not present

## 2016-08-04 DIAGNOSIS — R55 Syncope and collapse: Secondary | ICD-10-CM | POA: Diagnosis not present

## 2016-08-19 DIAGNOSIS — E039 Hypothyroidism, unspecified: Secondary | ICD-10-CM | POA: Diagnosis not present

## 2016-08-19 DIAGNOSIS — I10 Essential (primary) hypertension: Secondary | ICD-10-CM | POA: Diagnosis not present

## 2016-08-24 DIAGNOSIS — I259 Chronic ischemic heart disease, unspecified: Secondary | ICD-10-CM | POA: Diagnosis not present

## 2016-08-24 DIAGNOSIS — N400 Enlarged prostate without lower urinary tract symptoms: Secondary | ICD-10-CM | POA: Diagnosis not present

## 2016-08-24 DIAGNOSIS — I10 Essential (primary) hypertension: Secondary | ICD-10-CM | POA: Diagnosis not present

## 2016-08-24 DIAGNOSIS — E038 Other specified hypothyroidism: Secondary | ICD-10-CM | POA: Diagnosis not present

## 2016-10-12 DIAGNOSIS — N3941 Urge incontinence: Secondary | ICD-10-CM | POA: Diagnosis not present

## 2016-10-12 DIAGNOSIS — N4231 Prostatic intraepithelial neoplasia: Secondary | ICD-10-CM | POA: Diagnosis not present

## 2016-10-12 DIAGNOSIS — N401 Enlarged prostate with lower urinary tract symptoms: Secondary | ICD-10-CM | POA: Diagnosis not present

## 2016-10-12 DIAGNOSIS — C672 Malignant neoplasm of lateral wall of bladder: Secondary | ICD-10-CM | POA: Diagnosis not present

## 2016-10-21 DIAGNOSIS — H2513 Age-related nuclear cataract, bilateral: Secondary | ICD-10-CM | POA: Diagnosis not present

## 2016-11-02 DIAGNOSIS — Z23 Encounter for immunization: Secondary | ICD-10-CM | POA: Diagnosis not present

## 2016-11-04 DIAGNOSIS — H2513 Age-related nuclear cataract, bilateral: Secondary | ICD-10-CM | POA: Diagnosis not present

## 2016-11-15 DIAGNOSIS — N3941 Urge incontinence: Secondary | ICD-10-CM | POA: Diagnosis not present

## 2016-11-15 DIAGNOSIS — C672 Malignant neoplasm of lateral wall of bladder: Secondary | ICD-10-CM | POA: Diagnosis not present

## 2016-11-17 NOTE — Discharge Instructions (Signed)
Cataract Surgery, Care After °Refer to this sheet in the next few weeks. These instructions provide you with information about caring for yourself after your procedure. Your health care provider may also give you more specific instructions. Your treatment has been planned according to current medical practices, but problems sometimes occur. Call your health care provider if you have any problems or questions after your procedure. °What can I expect after the procedure? °After the procedure, it is common to have: °· Itching. °· Discomfort. °· Fluid discharge. °· Sensitivity to light and to touch. °· Bruising. °Follow these instructions at home: °Eye Care  °· Check your eye every day for signs of infection. Watch for: °¨ Redness, swelling, or pain. °¨ Fluid, blood, or pus. °¨ Warmth. °¨ Bad smell. °Activity  °· Avoid strenuous activities, such as playing contact sports, for as long as told by your health care provider. °· Do not drive or operate heavy machinery until your health care provider approves. °· Do not bend or lift heavy objects . Bending increases pressure in the eye. You can walk, climb stairs, and do light household chores. °· Ask your health care provider when you can return to work. If you work in a dusty environment, you may be advised to wear protective eyewear for a period of time. °General instructions  °· Take or apply over-the-counter and prescription medicines only as told by your health care provider. This includes eye drops. °· Do not touch or rub your eyes. °· If you were given a protective shield, wear it as told by your health care provider. If you were not given a protective shield, wear sunglasses as told by your health care provider to protect your eyes. °· Keep the area around your eye clean and dry. Avoid swimming or allowing water to hit you directly in the face while showering until told by your health care provider. Keep soap and shampoo out of your eyes. °· Do not put a contact lens  into the affected eye or eyes until your health care provider approves. °· Keep all follow-up visits as told by your health care provider. This is important. °Contact a health care provider if: ° °· You have increased bruising around your eye. °· You have pain that is not helped with medicine. °· You have a fever. °· You have redness, swelling, or pain in your eye. °· You have fluid, blood, or pus coming from your incision. °· Your vision gets worse. °Get help right away if: °· You have sudden vision loss. °This information is not intended to replace advice given to you by your health care provider. Make sure you discuss any questions you have with your health care provider. °Document Released: 06/24/2005 Document Revised: 04/14/2016 Document Reviewed: 10/15/2015 °Elsevier Interactive Patient Education © 2017 [employer] ° ° ° ° °General Anesthesia, Adult, Care After °These instructions provide you with information about caring for yourself after your procedure. Your health care provider may also give you more specific instructions. Your treatment has been planned according to current medical practices, but problems sometimes occur. Call your health care provider if you have any problems or questions after your procedure. °What can I expect after the procedure? °After the procedure, it is common to have: °· Vomiting. °· A sore throat. °· Mental slowness. °It is common to feel: °· Nauseous. °· Cold or shivery. °· Sleepy. °· Tired. °· Sore or achy, even in parts of your body where you did not have surgery. °Follow these instructions at   home: °For at least 24 hours after the procedure:  °· Do not: °¨ Participate in activities where you could fall or become injured. °¨ Drive. °¨ Use heavy machinery. °¨ Drink alcohol. °¨ Take sleeping pills or medicines that cause drowsiness. °¨ Make important decisions or sign legal documents. °¨ Take care of children on your own. °· Rest. °Eating and drinking  °· If you vomit, drink  water, juice, or soup when you can drink without vomiting. °· Drink enough fluid to keep your urine clear or pale yellow. °· Make sure you have little or no nausea before eating solid foods. °· Follow the diet recommended by your health care provider. °General instructions  °· Have a responsible adult stay with you until you are awake and alert. °· Return to your normal activities as told by your health care provider. Ask your health care provider what activities are safe for you. °· Take over-the-counter and prescription medicines only as told by your health care provider. °· If you smoke, do not smoke without supervision. °· Keep all follow-up visits as told by your health care provider. This is important. °Contact a health care provider if: °· You continue to have nausea or vomiting at home, and medicines are not helpful. °· You cannot drink fluids or start eating again. °· You cannot urinate after 8-12 hours. °· You develop a skin rash. °· You have fever. °· You have increasing redness at the site of your procedure. °Get help right away if: °· You have difficulty breathing. °· You have chest pain. °· You have unexpected bleeding. °· You feel that you are having a life-threatening or urgent problem. °This information is not intended to replace advice given to you by your health care provider. Make sure you discuss any questions you have with your health care provider. °Document Released: 03/13/2001 Document Revised: 05/09/2016 Document Reviewed: 11/19/2015 °Elsevier Interactive Patient Education © 2017 [employer] ° °

## 2016-11-23 ENCOUNTER — Ambulatory Visit: Payer: PPO | Admitting: Anesthesiology

## 2016-11-23 ENCOUNTER — Ambulatory Visit
Admission: RE | Admit: 2016-11-23 | Discharge: 2016-11-23 | Disposition: A | Discharge status: Home / Self Care | Payer: PPO | Source: Ambulatory Visit | Attending: Ophthalmology | Admitting: Ophthalmology

## 2016-11-23 ENCOUNTER — Encounter
Admission: RE | Disposition: A | Discharge status: Home / Self Care | Payer: Self-pay | Source: Ambulatory Visit | Attending: Ophthalmology

## 2016-11-23 DIAGNOSIS — I10 Essential (primary) hypertension: Secondary | ICD-10-CM | POA: Diagnosis not present

## 2016-11-23 DIAGNOSIS — H5703 Miosis: Secondary | ICD-10-CM | POA: Insufficient documentation

## 2016-11-23 DIAGNOSIS — H2513 Age-related nuclear cataract, bilateral: Secondary | ICD-10-CM | POA: Diagnosis not present

## 2016-11-23 DIAGNOSIS — I2510 Atherosclerotic heart disease of native coronary artery without angina pectoris: Secondary | ICD-10-CM | POA: Diagnosis not present

## 2016-11-23 DIAGNOSIS — H2512 Age-related nuclear cataract, left eye: Secondary | ICD-10-CM | POA: Diagnosis not present

## 2016-11-23 DIAGNOSIS — K219 Gastro-esophageal reflux disease without esophagitis: Secondary | ICD-10-CM | POA: Insufficient documentation

## 2016-11-23 HISTORY — DX: Gastro-esophageal reflux disease without esophagitis: K21.9

## 2016-11-23 HISTORY — PX: CATARACT EXTRACTION W/PHACO: SHX586

## 2016-11-23 HISTORY — DX: Acute myocardial infarction, unspecified: I21.9

## 2016-11-23 SURGERY — PHACOEMULSIFICATION, CATARACT, WITH IOL INSERTION
Anesthesia: Monitor Anesthesia Care | Laterality: Left | Wound class: Clean

## 2016-11-23 MED ORDER — TIMOLOL MALEATE 0.5 % OP SOLN
OPHTHALMIC | Status: DC | PRN
  Administered 2016-11-23: 1 [drp] via OPHTHALMIC

## 2016-11-23 MED ORDER — NA HYALUR & NA CHOND-NA HYALUR 0.4-0.35 ML IO KIT
PACK | INTRAOCULAR | Status: DC | PRN
  Administered 2016-11-23: 1 mL via INTRAOCULAR

## 2016-11-23 MED ORDER — ACETAMINOPHEN 160 MG/5ML PO SOLN: 325.0000 mg | ORAL | Status: DC | PRN

## 2016-11-23 MED ORDER — LACTATED RINGERS IV SOLN: INTRAVENOUS | Status: DC

## 2016-11-23 MED ORDER — MOXIFLOXACIN HCL 0.5 % OP SOLN
1.0000 [drp] | OPHTHALMIC | Status: DC | PRN
Start: 2016-11-23 — End: 2016-11-23
  Administered 2016-11-23 (×3): 1 [drp] via OPHTHALMIC

## 2016-11-23 MED ORDER — MIDAZOLAM HCL 2 MG/2ML IJ SOLN
INTRAMUSCULAR | Status: DC | PRN
  Administered 2016-11-23: 2 mg via INTRAVENOUS

## 2016-11-23 MED ORDER — FENTANYL CITRATE (PF) 100 MCG/2ML IJ SOLN
INTRAMUSCULAR | Status: DC | PRN
  Administered 2016-11-23: 50 ug via INTRAVENOUS

## 2016-11-23 MED ORDER — EPINEPHRINE PF 1 MG/ML IJ SOLN
INTRAMUSCULAR | Status: DC | PRN
  Administered 2016-11-23: 52 mL via OPHTHALMIC

## 2016-11-23 MED ORDER — ACETAMINOPHEN 325 MG PO TABS: 325.0000 mg | ORAL_TABLET | ORAL | Status: DC | PRN

## 2016-11-23 MED ORDER — CEFUROXIME OPHTHALMIC INJECTION 1 MG/0.1 ML
INJECTION | OPHTHALMIC | Status: DC | PRN
  Administered 2016-11-23: 0.1 mL via OPHTHALMIC

## 2016-11-23 MED ORDER — BRIMONIDINE TARTRATE 0.2 % OP SOLN
OPHTHALMIC | Status: DC | PRN
  Administered 2016-11-23: 1 [drp] via OPHTHALMIC

## 2016-11-23 MED ORDER — LIDOCAINE HCL (PF) 4 % IJ SOLN
INTRAOCULAR | Status: DC | PRN
  Administered 2016-11-23: 1 mL via OPHTHALMIC

## 2016-11-23 MED ORDER — ARMC OPHTHALMIC DILATING DROPS
1.0000 "application " | OPHTHALMIC | Status: DC | PRN
  Administered 2016-11-23 (×3): 1 via OPHTHALMIC

## 2016-11-23 SURGICAL SUPPLY — 27 items
CANNULA ANT/CHMB 27G (MISCELLANEOUS) ×1 IMPLANT
CANNULA ANT/CHMB 27GA (MISCELLANEOUS) ×3 IMPLANT
CARTRIDGE ABBOTT (MISCELLANEOUS) IMPLANT
GLOVE SURG LX 7.5 STRW (GLOVE) ×2
GLOVE SURG LX STRL 7.5 STRW (GLOVE) ×1 IMPLANT
GLOVE SURG TRIUMPH 8.0 PF LTX (GLOVE) ×3 IMPLANT
GOWN STRL REUS W/ TWL LRG LVL3 (GOWN DISPOSABLE) ×2 IMPLANT
GOWN STRL REUS W/TWL LRG LVL3 (GOWN DISPOSABLE) ×6
LENS IOL ACRYSOF IQ 21.0 (Intraocular Lens) ×2 IMPLANT
MARKER SKIN DUAL TIP RULER LAB (MISCELLANEOUS) ×3 IMPLANT
NDL FILTER BLUNT 18X1 1/2 (NEEDLE) ×1 IMPLANT
NDL RETROBULBAR .5 NSTRL (NEEDLE) IMPLANT
NEEDLE FILTER BLUNT 18X 1/2SAF (NEEDLE) ×2
NEEDLE FILTER BLUNT 18X1 1/2 (NEEDLE) ×1 IMPLANT
PACK CATARACT BRASINGTON (MISCELLANEOUS) ×3 IMPLANT
PACK EYE AFTER SURG (MISCELLANEOUS) ×3 IMPLANT
PACK OPTHALMIC (MISCELLANEOUS) ×3 IMPLANT
RING MALYGIN 7.0 (MISCELLANEOUS) ×2 IMPLANT
SUT ETHILON 10-0 CS-B-6CS-B-6 (SUTURE)
SUT VICRYL  9 0 (SUTURE)
SUT VICRYL 9 0 (SUTURE) IMPLANT
SUTURE EHLN 10-0 CS-B-6CS-B-6 (SUTURE) IMPLANT
SYR 3ML LL SCALE MARK (SYRINGE) ×3 IMPLANT
SYR 5ML LL (SYRINGE) ×3 IMPLANT
SYR TB 1ML LUER SLIP (SYRINGE) ×3 IMPLANT
WATER STERILE IRR 250ML POUR (IV SOLUTION) ×3 IMPLANT
WIPE NON LINTING 3.25X3.25 (MISCELLANEOUS) ×3 IMPLANT

## 2016-11-23 NOTE — Transfer of Care (Signed)
Immediate Anesthesia Transfer of Care Note  Patient: Nathaniel Ibarra  Procedure(s) Performed: Procedure(s): CATARACT EXTRACTION PHACO AND INTRAOCULAR LENS PLACEMENT (IOC) (Left)  Patient Location: PACU  Anesthesia Type: MAC  Level of Consciousness: awake, alert  and patient cooperative  Airway and Oxygen Therapy: Patient Spontanous Breathing and Patient connected to supplemental oxygen  Post-op Assessment: Post-op Vital signs reviewed, Patient's Cardiovascular Status Stable, Respiratory Function Stable, Patent Airway and No signs of Nausea or vomiting  Post-op Vital Signs: Reviewed and stable  Complications: No apparent anesthesia complications

## 2016-11-23 NOTE — Anesthesia Procedure Notes (Signed)
Procedure Name: MAC Performed by: Sanita Estrada Pre-anesthesia Checklist: Patient identified, Emergency Drugs available, Suction available, Timeout performed and Patient being monitored Patient Re-evaluated:Patient Re-evaluated prior to inductionOxygen Delivery Method: Nasal cannula Placement Confirmation: positive ETCO2     

## 2016-11-23 NOTE — Anesthesia Preprocedure Evaluation (Signed)
Anesthesia Evaluation  Patient identified by MRN, date of birth, ID band Patient awake    Reviewed: Allergy & Precautions, H&P , NPO status , Patient's Chart, lab work & pertinent test results  Airway Mallampati: II  TM Distance: >3 FB Neck ROM: full    Dental no notable dental hx.    Pulmonary    Pulmonary exam normal        Cardiovascular hypertension, + CAD  Normal cardiovascular exam     Neuro/Psych    GI/Hepatic GERD  ,  Endo/Other    Renal/GU      Musculoskeletal   Abdominal   Peds  Hematology   Anesthesia Other Findings   Reproductive/Obstetrics                             Anesthesia Physical Anesthesia Plan  ASA: II  Anesthesia Plan: MAC   Post-op Pain Management:    Induction:   Airway Management Planned:   Additional Equipment:   Intra-op Plan:   Post-operative Plan:   Informed Consent: I have reviewed the patients History and Physical, chart, labs and discussed the procedure including the risks, benefits and alternatives for the proposed anesthesia with the patient or authorized representative who has indicated his/her understanding and acceptance.     Plan Discussed with:   Anesthesia Plan Comments:         Anesthesia Quick Evaluation

## 2016-11-23 NOTE — Anesthesia Postprocedure Evaluation (Signed)
Anesthesia Post Note  Patient: Nathaniel Ibarra  Procedure(s) Performed: Procedure(s) (LRB): CATARACT EXTRACTION PHACO AND INTRAOCULAR LENS PLACEMENT (IOC) (Left)  Patient location during evaluation: PACU Anesthesia Type: MAC Level of consciousness: awake and alert and oriented Pain management: satisfactory to patient Vital Signs Assessment: post-procedure vital signs reviewed and stable Respiratory status: spontaneous breathing, nonlabored ventilation and respiratory function stable Cardiovascular status: blood pressure returned to baseline and stable Postop Assessment: Adequate PO intake and No signs of nausea or vomiting Anesthetic complications: no    Raliegh Ip

## 2016-11-23 NOTE — H&P (Signed)
The History and Physical notes are on paper, have been signed, and are to be scanned. The patient remains stable and unchanged from the H&P.   Previous H&P reviewed, patient examined, and there are no changes.  Nathaniel Ibarra 11/23/2016 10:10 AM

## 2016-11-23 NOTE — Op Note (Signed)
OPERATIVE NOTE  ZAHKAI MERSHON RN:[id number] 11/23/2016  PREOPERATIVE DIAGNOSIS:   Nuclear sclerotic cataract left eye with miotic pupil      H25.12   POSTOPERATIVE DIAGNOSIS:   Nuclear sclerotic cataract left eye with miotic pupil.     PROCEDURE:  Phacoemulsification with posterior chamber intraocular lens implantation of the left eye which required pupil stretching with the Malyugin pupil expansion device   LENS:   Implant Name Type Inv. Item Serial No. Manufacturer Lot No. LRB No. Used  LENS IOL ACRYSOF IQ 21.0 - [id number] Intraocular Lens LENS IOL ACRYSOF IQ 21.0 [id number] ALCON   Left 1        ULTRASOUND TIME: 18 % of 0 minutes, 46 seconds.  CDE 8.2   SURGEON:  Wyonia Hough, MD   ANESTHESIA: Topical with tetracaine drops and 2% Xylocaine jelly, augmented with 1% preservative-free intracameral lidocaine.   COMPLICATIONS:  None.   DESCRIPTION OF PROCEDURE:  The patient was identified in the holding room and transported to the operating room and placed in the supine position under the operating microscope.  The left eye was identified as the operative eye and it was prepped and draped in the usual sterile ophthalmic fashion.   A 1 millimeter clear-corneal paracentesis was made at the 1:30 position.  The anterior chamber was filled with Viscoat viscoelastic.  0.5 ml of preservative-free 1% lidocaine was injected into the anterior chamber.  A 2.4 millimeter keratome was used to make a near-clear corneal incision at the 10:30 position.  A Malyugin pupil expander was then placed through the main incision and into the anterior chamber of the eye.  The edge of the iris was secured on the lip of the pupil expander and it was released, thereby expanding the pupil to approximately 6 millimeters for completion of the cataract surgery.  Additional Viscoat was placed in the anterior chamber.  A cystotome and capsulorrhexis forceps were used to make a curvilinear capsulorrhexis.    Balanced salt solution was used to hydrodissect and hydrodelineate the lens nucleus.   Phacoemulsification was used in stop and chop fashion to remove the lens, nucleus and epinucleus.  The remaining cortex was aspirated using the irrigation aspiration handpiece.  Additional Provisc was placed into the eye to distend the capsular bag for lens placement.  A lens was then injected into the capsular bag.  The pupil expanding ring was removed using a Kuglen hook and insertion device. The remaining viscoelastic was aspirated from the capsular bag and the anterior chamber.  The anterior chamber was filled with balanced salt solution to inflate to a physiologic pressure.   Wounds were hydrated with balanced salt solution.  The anterior chamber was inflated to a physiologic pressure with balanced salt solution.  No wound leaks were noted. Cefuroxime 0.1 ml of a 10mg /ml solution was injected into the anterior chamber for a dose of 1 mg of intracameral antibiotic at the completion of the case.   Timolol and Brimonidine drops were applied to the eye.  The patient was taken to the recovery room in stable condition without complications of anesthesia or surgery.  Eris Breck 11/23/2016, 10:57 AM

## 2016-11-24 ENCOUNTER — Encounter: Payer: Self-pay | Admitting: Ophthalmology

## 2016-12-14 DIAGNOSIS — H2511 Age-related nuclear cataract, right eye: Secondary | ICD-10-CM | POA: Diagnosis not present

## 2017-01-04 ENCOUNTER — Ambulatory Visit: Admit: 2017-01-04 | Payer: PPO | Admitting: Ophthalmology

## 2017-01-04 SURGERY — PHACOEMULSIFICATION, CATARACT, WITH IOL INSERTION
Anesthesia: Topical | Laterality: Right

## 2017-01-27 ENCOUNTER — Encounter: Payer: Self-pay | Admitting: *Deleted

## 2017-01-27 NOTE — Discharge Instructions (Signed)
Cataract Surgery, Care After °Refer to this sheet in the next few weeks. These instructions provide you with information about caring for yourself after your procedure. Your health care provider may also give you more specific instructions. Your treatment has been planned according to current medical practices, but problems sometimes occur. Call your health care provider if you have any problems or questions after your procedure. °What can I expect after the procedure? °After the procedure, it is common to have: °· Itching. °· Discomfort. °· Fluid discharge. °· Sensitivity to light and to touch. °· Bruising. °Follow these instructions at home: °Eye Care  °· Check your eye every day for signs of infection. Watch for: °¨ Redness, swelling, or pain. °¨ Fluid, blood, or pus. °¨ Warmth. °¨ Bad smell. °Activity  °· Avoid strenuous activities, such as playing contact sports, for as long as told by your health care provider. °· Do not drive or operate heavy machinery until your health care provider approves. °· Do not bend or lift heavy objects . Bending increases pressure in the eye. You can walk, climb stairs, and do light household chores. °· Ask your health care provider when you can return to work. If you work in a dusty environment, you may be advised to wear protective eyewear for a period of time. °General instructions  °· Take or apply over-the-counter and prescription medicines only as told by your health care provider. This includes eye drops. °· Do not touch or rub your eyes. °· If you were given a protective shield, wear it as told by your health care provider. If you were not given a protective shield, wear sunglasses as told by your health care provider to protect your eyes. °· Keep the area around your eye clean and dry. Avoid swimming or allowing water to hit you directly in the face while showering until told by your health care provider. Keep soap and shampoo out of your eyes. °· Do not put a contact lens  into the affected eye or eyes until your health care provider approves. °· Keep all follow-up visits as told by your health care provider. This is important. °Contact a health care provider if: ° °· You have increased bruising around your eye. °· You have pain that is not helped with medicine. °· You have a fever. °· You have redness, swelling, or pain in your eye. °· You have fluid, blood, or pus coming from your incision. °· Your vision gets worse. °Get help right away if: °· You have sudden vision loss. °This information is not intended to replace advice given to you by your health care provider. Make sure you discuss any questions you have with your health care provider. °Document Released: 06/24/2005 Document Revised: 04/14/2016 Document Reviewed: 10/15/2015 °Elsevier Interactive Patient Education © 2017 [employer] ° ° ° ° °General Anesthesia, Adult, Care After °These instructions provide you with information about caring for yourself after your procedure. Your health care provider may also give you more specific instructions. Your treatment has been planned according to current medical practices, but problems sometimes occur. Call your health care provider if you have any problems or questions after your procedure. °What can I expect after the procedure? °After the procedure, it is common to have: °· Vomiting. °· A sore throat. °· Mental slowness. °It is common to feel: °· Nauseous. °· Cold or shivery. °· Sleepy. °· Tired. °· Sore or achy, even in parts of your body where you did not have surgery. °Follow these instructions at   home: °For at least 24 hours after the procedure:  °· Do not: °¨ Participate in activities where you could fall or become injured. °¨ Drive. °¨ Use heavy machinery. °¨ Drink alcohol. °¨ Take sleeping pills or medicines that cause drowsiness. °¨ Make important decisions or sign legal documents. °¨ Take care of children on your own. °· Rest. °Eating and drinking  °· If you vomit, drink  water, juice, or soup when you can drink without vomiting. °· Drink enough fluid to keep your urine clear or pale yellow. °· Make sure you have little or no nausea before eating solid foods. °· Follow the diet recommended by your health care provider. °General instructions  °· Have a responsible adult stay with you until you are awake and alert. °· Return to your normal activities as told by your health care provider. Ask your health care provider what activities are safe for you. °· Take over-the-counter and prescription medicines only as told by your health care provider. °· If you smoke, do not smoke without supervision. °· Keep all follow-up visits as told by your health care provider. This is important. °Contact a health care provider if: °· You continue to have nausea or vomiting at home, and medicines are not helpful. °· You cannot drink fluids or start eating again. °· You cannot urinate after 8-12 hours. °· You develop a skin rash. °· You have fever. °· You have increasing redness at the site of your procedure. °Get help right away if: °· You have difficulty breathing. °· You have chest pain. °· You have unexpected bleeding. °· You feel that you are having a life-threatening or urgent problem. °This information is not intended to replace advice given to you by your health care provider. Make sure you discuss any questions you have with your health care provider. °Document Released: 03/13/2001 Document Revised: 05/09/2016 Document Reviewed: 11/19/2015 °Elsevier Interactive Patient Education © 2017 [employer] ° °

## 2017-02-01 ENCOUNTER — Ambulatory Visit: Payer: PPO | Admitting: Anesthesiology

## 2017-02-01 ENCOUNTER — Ambulatory Visit
Admission: RE | Admit: 2017-02-01 | Discharge: 2017-02-01 | Disposition: A | Discharge status: Home / Self Care | Payer: PPO | Source: Ambulatory Visit | Attending: Ophthalmology | Admitting: Ophthalmology

## 2017-02-01 ENCOUNTER — Encounter
Admission: RE | Disposition: A | Discharge status: Home / Self Care | Payer: Self-pay | Source: Ambulatory Visit | Attending: Ophthalmology

## 2017-02-01 DIAGNOSIS — Z79899 Other long term (current) drug therapy: Secondary | ICD-10-CM | POA: Diagnosis not present

## 2017-02-01 DIAGNOSIS — H2511 Age-related nuclear cataract, right eye: Secondary | ICD-10-CM | POA: Diagnosis not present

## 2017-02-01 DIAGNOSIS — K219 Gastro-esophageal reflux disease without esophagitis: Secondary | ICD-10-CM | POA: Insufficient documentation

## 2017-02-01 DIAGNOSIS — H5703 Miosis: Secondary | ICD-10-CM | POA: Diagnosis not present

## 2017-02-01 DIAGNOSIS — I10 Essential (primary) hypertension: Secondary | ICD-10-CM | POA: Diagnosis not present

## 2017-02-01 HISTORY — PX: CATARACT EXTRACTION W/PHACO: SHX586

## 2017-02-01 SURGERY — PHACOEMULSIFICATION, CATARACT, WITH IOL INSERTION
Anesthesia: Monitor Anesthesia Care | Site: Eye | Laterality: Right

## 2017-02-01 MED ORDER — ACETAMINOPHEN 160 MG/5ML PO SOLN: 325.0000 mg | ORAL | Status: DC | PRN

## 2017-02-01 MED ORDER — BRIMONIDINE TARTRATE-TIMOLOL 0.2-0.5 % OP SOLN
OPHTHALMIC | Status: DC | PRN
  Administered 2017-02-01: 1 [drp] via OPHTHALMIC

## 2017-02-01 MED ORDER — LACTATED RINGERS IV SOLN
INTRAVENOUS | Status: DC
Start: 2017-02-01 — End: 2017-02-01

## 2017-02-01 MED ORDER — EPINEPHRINE PF 1 MG/ML IJ SOLN
INTRAOCULAR | Status: DC | PRN
  Administered 2017-02-01: 53 mL via OPHTHALMIC

## 2017-02-01 MED ORDER — NA HYALUR & NA CHOND-NA HYALUR 0.4-0.35 ML IO KIT
PACK | INTRAOCULAR | Status: DC | PRN
  Administered 2017-02-01: 1 mL via INTRAOCULAR

## 2017-02-01 MED ORDER — MIDAZOLAM HCL 2 MG/2ML IJ SOLN
INTRAMUSCULAR | Status: DC | PRN
  Administered 2017-02-01: 2 mg via INTRAVENOUS

## 2017-02-01 MED ORDER — FENTANYL CITRATE (PF) 100 MCG/2ML IJ SOLN
INTRAMUSCULAR | Status: DC | PRN
  Administered 2017-02-01: 50 ug via INTRAVENOUS

## 2017-02-01 MED ORDER — LACTATED RINGERS IV SOLN: 500.0000 mL | INTRAVENOUS | Status: DC

## 2017-02-01 MED ORDER — ARMC OPHTHALMIC DILATING DROPS
1.0000 "application " | OPHTHALMIC | Status: DC | PRN
  Administered 2017-02-01 (×3): 1 via OPHTHALMIC

## 2017-02-01 MED ORDER — CEFUROXIME OPHTHALMIC INJECTION 1 MG/0.1 ML
INJECTION | OPHTHALMIC | Status: DC | PRN
  Administered 2017-02-01: .3 mL via OPHTHALMIC

## 2017-02-01 MED ORDER — LIDOCAINE HCL (PF) 2 % IJ SOLN
INTRAOCULAR | Status: DC | PRN
  Administered 2017-02-01: 2 mL via INTRAOCULAR

## 2017-02-01 MED ORDER — ACETAMINOPHEN 325 MG PO TABS: 325.0000 mg | ORAL_TABLET | ORAL | Status: DC | PRN

## 2017-02-01 MED ORDER — MOXIFLOXACIN HCL 0.5 % OP SOLN
1.0000 [drp] | OPHTHALMIC | Status: DC | PRN
  Administered 2017-02-01 (×3): 1 [drp] via OPHTHALMIC

## 2017-02-01 SURGICAL SUPPLY — 27 items
Acrysof IQ Astigmatism IOL (Intraocular Lens) ×2 IMPLANT
CANNULA ANT/CHMB 27G (MISCELLANEOUS) ×1 IMPLANT
CANNULA ANT/CHMB 27GA (MISCELLANEOUS) ×3 IMPLANT
CARTRIDGE ABBOTT (MISCELLANEOUS) IMPLANT
GLOVE SURG LX 7.5 STRW (GLOVE) ×2
GLOVE SURG LX STRL 7.5 STRW (GLOVE) ×1 IMPLANT
GLOVE SURG TRIUMPH 8.0 PF LTX (GLOVE) ×3 IMPLANT
GOWN STRL REUS W/ TWL LRG LVL3 (GOWN DISPOSABLE) ×2 IMPLANT
GOWN STRL REUS W/TWL LRG LVL3 (GOWN DISPOSABLE) ×6
MARKER SKIN DUAL TIP RULER LAB (MISCELLANEOUS) ×3 IMPLANT
NDL FILTER BLUNT 18X1 1/2 (NEEDLE) ×1 IMPLANT
NDL RETROBULBAR .5 NSTRL (NEEDLE) IMPLANT
NEEDLE FILTER BLUNT 18X 1/2SAF (NEEDLE) ×2
NEEDLE FILTER BLUNT 18X1 1/2 (NEEDLE) ×1 IMPLANT
PACK CATARACT BRASINGTON (MISCELLANEOUS) ×3 IMPLANT
PACK EYE AFTER SURG (MISCELLANEOUS) ×3 IMPLANT
PACK OPTHALMIC (MISCELLANEOUS) ×3 IMPLANT
RING MALYGIN 7.0 (MISCELLANEOUS) IMPLANT
SUT ETHILON 10-0 CS-B-6CS-B-6 (SUTURE)
SUT VICRYL  9 0 (SUTURE)
SUT VICRYL 9 0 (SUTURE) IMPLANT
SUTURE EHLN 10-0 CS-B-6CS-B-6 (SUTURE) IMPLANT
SYR 3ML LL SCALE MARK (SYRINGE) ×3 IMPLANT
SYR 5ML LL (SYRINGE) ×3 IMPLANT
SYR TB 1ML LUER SLIP (SYRINGE) ×3 IMPLANT
WATER STERILE IRR 250ML POUR (IV SOLUTION) ×3 IMPLANT
WIPE NON LINTING 3.25X3.25 (MISCELLANEOUS) ×3 IMPLANT

## 2017-02-01 NOTE — Op Note (Signed)
LOCATION:  [place]   PREOPERATIVE DIAGNOSIS:  Nuclear sclerotic cataract of the right eye with miotic pupil  H25.11   POSTOPERATIVE DIAGNOSIS:  Nuclear sclerotic cataract of the right eye witht miotic pupil.   PROCEDURE:  Phacoemulsification with Toric posterior chamber intraocular lens placement of the right eye which required pupil expansion using the Malyugin device.   LENS:   Implant Name Type Inv. Item Serial No. Manufacturer Lot No. LRB No. Used  Acrysof IQ Astigmatism IOL Intraocular Lens   [id number] ALCON   Right 1     [id number] 22.0 D Toric intraocular lens with 1.50 diopters of cylindrical power with axis orientation at 1 degrees.   ULTRASOUND TIME: 18 % of 0 minutes, 54 seconds.  CDE 9.4   SURGEON:  Wyonia Hough, MD   ANESTHESIA: Topical with tetracaine drops and 2% Xylocaine jelly, augmented with 1% preservative-free intracameral lidocaine. .   COMPLICATIONS:  None.   DESCRIPTION OF PROCEDURE:  The patient was identified in the holding room and transported to the operating suite and placed in the supine position under the operating microscope.  The right eye was identified as the operative eye, and it was prepped and draped in the usual sterile ophthalmic fashion.    A clear-corneal paracentesis incision was made at the 12:00 position.  0.5 ml of preservative-free 1% lidocaine was injected into the anterior chamber. The anterior chamber was filled with Viscoat.  A Malyugin ring was placed to expand the pupil from 59mm to 7 mm.  A 2.4 millimeter near clear corneal incision was then made at the 9:00 position.  A cystotome and capsulorrhexis forceps were then used to make a curvilinear capsulorrhexis.  Hydrodissection and hydrodelineation were then performed using balanced salt solution.   Phacoemulsification was then used in stop and chop fashion to remove the lens, nucleus and epinucleus.  The remaining cortex was aspirated using the irrigation and  aspiration handpiece.  Provisc viscoelastic was then placed into the capsular bag to distend it for lens placement.  The Verion digital marker was used to align the implant at the intended axis.   A Toric lens was then injected into the capsular bag.  It was rotated clockwise until the axis marks on the lens were approximately 15 degrees in the counterclockwise direction to the intended alignment.  The Malyugin ring was removed.  The viscoelastic was aspirated from the eye using the irrigation aspiration handpiece.  Then, a Koch spatula through the sideport incision was used to rotate the lens in a clockwise direction until the axis markings of the intraocular lens were lined up with the Verion alignment.  Balanced salt solution was then used to hydrate the wounds. Cefuroxime 0.1 ml of a 10mg /ml solution was injected into the anterior chamber for a dose of 1 mg of intracameral antibiotic at the completion of the case.    The eye was noted to have a physiologic pressure and there was no wound leak noted.   Timolol and Brimonidine drops were applied to the eye.  The patient was taken to the recovery room in stable condition having had no complications of anesthesia or surgery.  Nathaniel Ibarra 02/01/2017, 9:48 AM

## 2017-02-01 NOTE — Anesthesia Procedure Notes (Signed)
Procedure Name: MAC Performed by: Mayme Genta Pre-anesthesia Checklist: Patient identified, Emergency Drugs available, Suction available, Timeout performed and Patient being monitored Patient Re-evaluated:Patient Re-evaluated prior to inductionOxygen Delivery Method: Nasal cannula Placement Confirmation: positive ETCO2

## 2017-02-01 NOTE — Anesthesia Postprocedure Evaluation (Signed)
Anesthesia Post Note  Patient: Nathaniel Ibarra  Procedure(s) Performed: Procedure(s) (LRB): CATARACT EXTRACTION PHACO AND INTRAOCULAR LENS PLACEMENT ([place]) Complicated  right toric lens (Right)  Patient location during evaluation: PACU Anesthesia Type: MAC Level of consciousness: awake and alert Pain management: pain level controlled Vital Signs Assessment: post-procedure vital signs reviewed and stable Respiratory status: spontaneous breathing, nonlabored ventilation and respiratory function stable Cardiovascular status: stable and blood pressure returned to baseline Anesthetic complications: no    Mamta Rimmer D Nazier Neyhart

## 2017-02-01 NOTE — Anesthesia Preprocedure Evaluation (Signed)
Anesthesia Evaluation  Patient identified by MRN, date of birth, ID band Patient awake    Reviewed: Allergy & Precautions, H&P , NPO status , Patient's Chart, lab work & pertinent test results, reviewed documented beta blocker date and time   Airway Mallampati: II  TM Distance: >3 FB Neck ROM: full    Dental no notable dental hx.    Pulmonary neg pulmonary ROS,    Pulmonary exam normal breath sounds clear to auscultation       Cardiovascular Exercise Tolerance: Good hypertension, negative cardio ROS   Rhythm:regular Rate:Normal     Neuro/Psych negative neurological ROS  negative psych ROS   GI/Hepatic Neg liver ROS, GERD  Medicated,  Endo/Other  negative endocrine ROS  Renal/GU negative Renal ROS  negative genitourinary   Musculoskeletal   Abdominal   Peds  Hematology negative hematology ROS (+)   Anesthesia Other Findings   Reproductive/Obstetrics negative OB ROS                             Anesthesia Physical Anesthesia Plan  ASA: II  Anesthesia Plan: MAC   Post-op Pain Management:    Induction:   Airway Management Planned:   Additional Equipment:   Intra-op Plan:   Post-operative Plan:   Informed Consent:   Plan Discussed with: CRNA  Anesthesia Plan Comments:         Anesthesia Quick Evaluation

## 2017-02-01 NOTE — Transfer of Care (Signed)
Immediate Anesthesia Transfer of Care Note  Patient: Nathaniel Ibarra  Procedure(s) Performed: Procedure(s) with comments: CATARACT EXTRACTION PHACO AND INTRAOCULAR LENS PLACEMENT ([place]) Complicated  right toric lens (Right) - Malyugin toric Lens  Patient Location: PACU  Anesthesia Type: MAC  Level of Consciousness: awake, alert  and patient cooperative  Airway and Oxygen Therapy: Patient Spontanous Breathing and Patient connected to supplemental oxygen  Post-op Assessment: Post-op Vital signs reviewed, Patient's Cardiovascular Status Stable, Respiratory Function Stable, Patent Airway and No signs of Nausea or vomiting  Post-op Vital Signs: Reviewed and stable  Complications: No apparent anesthesia complications

## 2017-02-01 NOTE — H&P (Signed)
The History and Physical notes are on paper, have been signed, and are to be scanned. The patient remains stable and unchanged from the H&P.   Previous H&P reviewed, patient examined, and there are no changes.  Nathaniel Ibarra 02/01/2017 9:12 AM

## 2017-02-02 ENCOUNTER — Encounter: Payer: Self-pay | Admitting: Ophthalmology

## 2017-02-07 ENCOUNTER — Encounter: Payer: Self-pay | Admitting: Ophthalmology

## 2017-02-16 DIAGNOSIS — F330 Major depressive disorder, recurrent, mild: Secondary | ICD-10-CM | POA: Diagnosis not present

## 2017-02-16 DIAGNOSIS — J069 Acute upper respiratory infection, unspecified: Secondary | ICD-10-CM | POA: Diagnosis not present

## 2017-02-22 DIAGNOSIS — E039 Hypothyroidism, unspecified: Secondary | ICD-10-CM | POA: Diagnosis not present

## 2017-02-22 DIAGNOSIS — E119 Type 2 diabetes mellitus without complications: Secondary | ICD-10-CM | POA: Diagnosis not present

## 2017-03-01 DIAGNOSIS — F330 Major depressive disorder, recurrent, mild: Secondary | ICD-10-CM | POA: Diagnosis not present

## 2017-03-01 DIAGNOSIS — E038 Other specified hypothyroidism: Secondary | ICD-10-CM | POA: Diagnosis not present

## 2017-03-01 DIAGNOSIS — I10 Essential (primary) hypertension: Secondary | ICD-10-CM | POA: Diagnosis not present

## 2017-04-03 DIAGNOSIS — F330 Major depressive disorder, recurrent, mild: Secondary | ICD-10-CM | POA: Diagnosis not present

## 2017-04-12 DIAGNOSIS — R351 Nocturia: Secondary | ICD-10-CM | POA: Diagnosis not present

## 2017-04-12 DIAGNOSIS — N401 Enlarged prostate with lower urinary tract symptoms: Secondary | ICD-10-CM | POA: Diagnosis not present

## 2017-04-18 DIAGNOSIS — F330 Major depressive disorder, recurrent, mild: Secondary | ICD-10-CM | POA: Diagnosis not present

## 2017-04-18 DIAGNOSIS — I10 Essential (primary) hypertension: Secondary | ICD-10-CM | POA: Diagnosis not present

## 2017-05-16 DIAGNOSIS — R110 Nausea: Secondary | ICD-10-CM | POA: Diagnosis not present

## 2017-05-16 DIAGNOSIS — F330 Major depressive disorder, recurrent, mild: Secondary | ICD-10-CM | POA: Diagnosis not present

## 2017-05-22 DIAGNOSIS — L578 Other skin changes due to chronic exposure to nonionizing radiation: Secondary | ICD-10-CM | POA: Diagnosis not present

## 2017-05-22 DIAGNOSIS — D1800 Hemangioma unspecified site: Secondary | ICD-10-CM | POA: Diagnosis not present

## 2017-05-22 DIAGNOSIS — L570 Actinic keratosis: Secondary | ICD-10-CM | POA: Diagnosis not present

## 2017-05-22 DIAGNOSIS — D692 Other nonthrombocytopenic purpura: Secondary | ICD-10-CM | POA: Diagnosis not present

## 2017-05-22 DIAGNOSIS — L821 Other seborrheic keratosis: Secondary | ICD-10-CM | POA: Diagnosis not present

## 2017-05-22 DIAGNOSIS — Z1283 Encounter for screening for malignant neoplasm of skin: Secondary | ICD-10-CM | POA: Diagnosis not present

## 2017-05-22 DIAGNOSIS — L812 Freckles: Secondary | ICD-10-CM | POA: Diagnosis not present

## 2017-06-07 ENCOUNTER — Emergency Department
Admission: EM | Admit: 2017-06-07 | Discharge: 2017-06-07 | Disposition: A | Discharge status: Home / Self Care | Payer: PPO | Attending: Emergency Medicine | Admitting: Emergency Medicine

## 2017-06-07 ENCOUNTER — Encounter: Payer: Self-pay | Admitting: Emergency Medicine

## 2017-06-07 DIAGNOSIS — Z79899 Other long term (current) drug therapy: Secondary | ICD-10-CM | POA: Insufficient documentation

## 2017-06-07 DIAGNOSIS — F329 Major depressive disorder, single episode, unspecified: Secondary | ICD-10-CM | POA: Diagnosis not present

## 2017-06-07 DIAGNOSIS — Z7982 Long term (current) use of aspirin: Secondary | ICD-10-CM | POA: Insufficient documentation

## 2017-06-07 DIAGNOSIS — I10 Essential (primary) hypertension: Secondary | ICD-10-CM | POA: Insufficient documentation

## 2017-06-07 DIAGNOSIS — Z8551 Personal history of malignant neoplasm of bladder: Secondary | ICD-10-CM | POA: Insufficient documentation

## 2017-06-07 DIAGNOSIS — F32A Depression, unspecified: Secondary | ICD-10-CM

## 2017-06-07 DIAGNOSIS — I25110 Atherosclerotic heart disease of native coronary artery with unstable angina pectoris: Secondary | ICD-10-CM | POA: Insufficient documentation

## 2017-06-07 DIAGNOSIS — Z7902 Long term (current) use of antithrombotics/antiplatelets: Secondary | ICD-10-CM | POA: Insufficient documentation

## 2017-06-07 DIAGNOSIS — R5383 Other fatigue: Secondary | ICD-10-CM | POA: Diagnosis not present

## 2017-06-07 LAB — URINALYSIS, COMPLETE (UACMP) WITH MICROSCOPIC
BACTERIA UA: NONE SEEN
BILIRUBIN URINE: NEGATIVE
Glucose, UA: NEGATIVE mg/dL
Hgb urine dipstick: NEGATIVE
LEUKOCYTES UA: NEGATIVE
Nitrite: NEGATIVE
PH: 5.5 (ref 5.0–8.0)
PROTEIN: NEGATIVE mg/dL
SQUAMOUS EPITHELIAL / LPF: NONE SEEN
Specific Gravity, Urine: 1.025 (ref 1.005–1.030)

## 2017-06-07 LAB — CBC
HCT: 44.7 % (ref 40.0–52.0)
HEMOGLOBIN: 15.5 g/dL (ref 13.0–18.0)
MCH: 32.1 pg (ref 26.0–34.0)
MCHC: 34.8 g/dL (ref 32.0–36.0)
MCV: 92.4 fL (ref 80.0–100.0)
Platelets: 283 10*3/uL (ref 150–440)
RBC: 4.83 MIL/uL (ref 4.40–5.90)
RDW: 13.1 % (ref 11.5–14.5)
WBC: 9.9 10*3/uL (ref 3.8–10.6)

## 2017-06-07 LAB — BASIC METABOLIC PANEL
Anion gap: 3 — ABNORMAL LOW (ref 5–15)
BUN: 17 mg/dL (ref 6–20)
CHLORIDE: 106 mmol/L (ref 101–111)
CO2: 27 mmol/L (ref 22–32)
Calcium: 8.8 mg/dL — ABNORMAL LOW (ref 8.9–10.3)
Creatinine, Ser: 0.74 mg/dL (ref 0.61–1.24)
GFR calc non Af Amer: >60 mL/min (ref >60)
Glucose, Bld: 109 mg/dL — ABNORMAL HIGH (ref 65–99)
POTASSIUM: 4.2 mmol/L (ref 3.5–5.1)
SODIUM: 136 mmol/L (ref 135–145)

## 2017-06-07 LAB — PROTIME-INR
INR: 0.95
PROTHROMBIN TIME: 12.7 s (ref 11.4–15.2)

## 2017-06-07 LAB — TSH: TSH: 3.329 u[IU]/mL (ref 0.350–4.500)

## 2017-06-07 LAB — T4, FREE: Free T4: 0.96 ng/dL (ref 0.61–1.12)

## 2017-06-07 MED ORDER — SODIUM CHLORIDE 0.9 % IV BOLUS (SEPSIS)
1000.0000 mL | Freq: Once | INTRAVENOUS | Status: AC
  Administered 2017-06-07: 1000 mL via INTRAVENOUS

## 2017-06-07 NOTE — ED Provider Notes (Signed)
[facility] Emergency Department Provider Note  ____________________________________________  Time seen: Approximately 1:09 PM  I have reviewed the triage vital signs and the nursing notes.   HISTORY  Chief Complaint Fatigue   HPI Nathaniel Ibarra is a 76 y.o. male who presents for evaluation of fatigue, lack of energy, sadness, lack of appetite, and trouble sleeping. Patient's symptoms have been going on for over a year now. He has been tried on different antidepressants by his primary care doctor with no success. Patient feels very down. He lost his son who committed suicide 8 years ago. 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 He also raises his granddaughter after his son committed suicide who is now 57 and according to the grandparents brings a lot of stress to the household. Patient reports that he has no appetite. That he only finds energy to do the essential things that he needs to do to run his house and family. He has lost 25 pounds over the course of the last year due to lack of appetite. He also has trouble sleeping. He denies suicidal or homicidal ideation. He denies prior history of depression before his son and staff but since then has had a lot of grief and depression. He was referred by his primary care doctor to see a psychiatrist as an outpatient but hasn't gotten around to do that yet. His wife brought him here today because the PCP does not know what else to do and they're looking for help somewhere else. No recent illness, no URI symptoms, no vomiting or diarrhea, no chest pain or shortness of breath, no abdominal pain.  Past Medical History:  Diagnosis Date  . Bladder cancer ([place])   . BPH (benign prostatic hyperplasia)   . GERD (gastroesophageal reflux disease)   . Hypertension   . Myocardial infarction [facility])    cath with stent in 2016  . Thyroid disease     Patient Active Problem List   Diagnosis Date Noted  .  Coronary artery disease 09/02/2015  . Acute coronary syndrome ([place]) 09/01/2015  . Unstable angina ([place]) 09/01/2015  . Chest pain 08/16/2015    Past Surgical History:  Procedure Laterality Date  . CARDIAC CATHETERIZATION N/A 09/01/2015   Procedure: Left Heart Cath and Coronary Angiography;  Surgeon: Dionisio David, MD;  Location: [place] CV LAB;  Service: Cardiovascular;  Laterality: N/A;  . CARDIAC CATHETERIZATION N/A 09/01/2015   Procedure: Coronary Stent Intervention;  Surgeon: Yolonda Kida, MD;  Location: [place] CV LAB;  Service: Cardiovascular;  Laterality: N/A;  . CATARACT EXTRACTION W/PHACO Left 11/23/2016   Procedure: CATARACT EXTRACTION PHACO AND INTRAOCULAR LENS PLACEMENT ([facility]);  Surgeon: Leandrew Koyanagi, MD;  Location: [place];  Service: Ophthalmology;  Laterality: Left;  . CATARACT EXTRACTION W/PHACO Right 02/01/2017   Procedure: CATARACT EXTRACTION PHACO AND INTRAOCULAR LENS PLACEMENT ([place]) Complicated  right toric lens;  Surgeon: Leandrew Koyanagi, MD;  Location: [place];  Service: Ophthalmology;  Laterality: Right;  Malyugin toric Lens    Prior to Admission medications   Medication Sig Start Date End Date Taking? Authorizing Provider  amLODipine (NORVASC) 5 MG tablet Take 5 mg by mouth daily. 05/09/15   [provider]  aspirin EC 325 MG EC tablet Take 1 tablet (325 mg total) by mouth daily. 09/02/15   Barnabas Harries, PA-C  clopidogrel (PLAVIX) 75 MG tablet Take 1 tablet (75 mg total) by mouth once. 09/02/15  Jasmine Pang A, PA-C  finasteride (PROSCAR) 5 MG tablet Take 5 mg by mouth daily. 08/03/15   [provider]  ibuprofen (MOTRIN IB) 200 MG tablet Take 1 tablet (200 mg total) by mouth every 6 (six) hours as needed. 08/17/15   Epifanio Lesches, MD  levothyroxine (SYNTHROID, LEVOTHROID) 50 MCG tablet Take 50 mcg by mouth every morning. 07/12/15   [provider]  pantoprazole (PROTONIX) 40 MG tablet  Take 40 mg by mouth daily.    [provider]  rosuvastatin (CRESTOR) 5 MG tablet Take 5 mg by mouth daily.    [provider]  sertraline (ZOLOFT) 25 MG tablet Take 25 mg by mouth daily.    [provider]  tamsulosin (FLOMAX) 0.4 MG CAPS capsule Take 0.4 mg by mouth daily. 08/01/15   [provider]    Allergies Patient has no known allergies.  Family History  Problem Relation Age of Onset  . Emphysema Mother   . Emphysema Father     Social History Social History  Substance Use Topics  . Smoking status: Never Smoker  . Smokeless tobacco: Never Used  . Alcohol use No    Review of Systems  Constitutional: Negative for fever. Eyes: Negative for visual changes. ENT: Negative for sore throat. Neck: No neck pain  Cardiovascular: Negative for chest pain. Respiratory: Negative for shortness of breath. Gastrointestinal: Negative for abdominal pain, vomiting or diarrhea. Genitourinary: Negative for dysuria. Musculoskeletal: Negative for back pain. Skin: Negative for rash. Neurological: Negative for headaches, weakness or numbness. Psych: No SI or HI. + depression, lack of appetite, trouble sleeping  ____________________________________________   PHYSICAL EXAM:  VITAL SIGNS: ED Triage Vitals  Enc Vitals Group     BP 06/07/17 1100 (!) 144/68     Pulse Rate 06/07/17 1100 65     Resp 06/07/17 1100 15     Temp 06/07/17 1100 98.8 F (37.1 C)     Temp Source 06/07/17 1100 Oral     SpO2 06/07/17 1100 96 %     Weight 06/07/17 1101 180 lb (81.6 kg)     Height 06/07/17 1101 6' (1.829 m)     Head Circumference --      Peak Flow --      Pain Score --      Pain Loc --      Pain Edu? --      Excl. in [place]? --     Constitutional: Alert and oriented. Well appearing and in no apparent distress. HEENT:      Head: Normocephalic and atraumatic.         Eyes: Conjunctivae are normal. Sclera is non-icteric.       Mouth/Throat: Mucous membranes are  moist.       Neck: Supple with no signs of meningismus. Cardiovascular: Regular rate and rhythm. No murmurs, gallops, or rubs. 2+ symmetrical distal pulses are present in all extremities. No JVD. Respiratory: Normal respiratory effort. Lungs are clear to auscultation bilaterally. No wheezes, crackles, or rhonchi.  Gastrointestinal: Soft, non tender, and non distended with positive bowel sounds. No rebound or guarding. Genitourinary: No CVA tenderness. Musculoskeletal: Nontender with normal range of motion in all extremities. No edema, cyanosis, or erythema of extremities. Neurologic: Normal speech and language. Face is symmetric. Moving all extremities. No gross focal neurologic deficits are appreciated. Skin: Skin is warm, dry and intact. No rash noted. Psychiatric: Mood and affect are depressed. Speech and behavior are normal.  ____________________________________________   LABS (all labs  ordered are listed, but only abnormal results are displayed)  Labs Reviewed  BASIC METABOLIC PANEL - Abnormal; Notable for the following:       Result Value   Glucose, Bld 109 (*)    Calcium 8.8 (*)    Anion gap 3 (*)    All other components within normal limits  URINALYSIS, COMPLETE (UACMP) WITH MICROSCOPIC - Abnormal; Notable for the following:    Ketones, ur TRACE (*)    All other components within normal limits  CBC  PROTIME-INR  TSH  T4, FREE  CBG MONITORING, ED   ____________________________________________  EKG  ED ECG REPORT I, Rudene Re, the attending physician, personally viewed and interpreted this ECG.  Normal sinus rhythm, rate of 61, normal intervals, left axis deviation, no ST elevations or depressions, LVH. No significant changes when compared to prior from 2016 ____________________________________________  RADIOLOGY  none ____________________________________________   PROCEDURES  Procedure(s) performed: None Procedures Critical Care performed:   None ____________________________________________   INITIAL IMPRESSION / ASSESSMENT AND PLAN / ED COURSE  76 y.o. male who presents for evaluation of fatigue, lack of energy, sadness, lack of appetite, and trouble sleeping. Patient with no emergent medical needs at this time. No suicidal or homicidal ideation. We'll provide resources for outpatient psychiatric help in the community. Blood work with no acute findings other than maybe some mild dehydration with trace ketones in his urine. Patient was given IV fluids for that. Recommended that patient or wife call the crisis lin, 911 or return to the ER if patient ever expresses suicidal/homicidal ideation.     Pertinent labs & imaging results that were available during my care of the patient were reviewed by me and considered in my medical decision making (see chart for details).    ____________________________________________   FINAL CLINICAL IMPRESSION(S) / ED DIAGNOSES  Final diagnoses:  Depression, unspecified depression type      NEW MEDICATIONS STARTED DURING THIS VISIT:  New Prescriptions   No medications on file     Note:  This document was prepared using Dragon voice recognition software and may include unintentional dictation errors.    Alfred Levins, [place], MD 06/07/17 1359

## 2017-06-07 NOTE — ED Triage Notes (Signed)
Pt to ed with c/o increasing fatigue on and off since October of last year.

## 2017-06-07 NOTE — ED Notes (Signed)
EDP at bedside  

## 2017-06-12 DIAGNOSIS — F330 Major depressive disorder, recurrent, mild: Secondary | ICD-10-CM | POA: Diagnosis not present

## 2017-07-24 DIAGNOSIS — F330 Major depressive disorder, recurrent, mild: Secondary | ICD-10-CM | POA: Diagnosis not present

## 2017-08-02 DIAGNOSIS — D485 Neoplasm of uncertain behavior of skin: Secondary | ICD-10-CM | POA: Diagnosis not present

## 2017-08-02 DIAGNOSIS — B079 Viral wart, unspecified: Secondary | ICD-10-CM | POA: Diagnosis not present

## 2017-08-02 DIAGNOSIS — L570 Actinic keratosis: Secondary | ICD-10-CM | POA: Diagnosis not present

## 2017-08-02 DIAGNOSIS — L578 Other skin changes due to chronic exposure to nonionizing radiation: Secondary | ICD-10-CM | POA: Diagnosis not present

## 2017-08-02 DIAGNOSIS — D692 Other nonthrombocytopenic purpura: Secondary | ICD-10-CM | POA: Diagnosis not present

## 2017-08-28 DIAGNOSIS — F330 Major depressive disorder, recurrent, mild: Secondary | ICD-10-CM | POA: Diagnosis not present

## 2017-09-12 DIAGNOSIS — Z961 Presence of intraocular lens: Secondary | ICD-10-CM | POA: Diagnosis not present

## 2017-09-21 DIAGNOSIS — Z23 Encounter for immunization: Secondary | ICD-10-CM | POA: Diagnosis not present

## 2017-09-21 DIAGNOSIS — S80812A Abrasion, left lower leg, initial encounter: Secondary | ICD-10-CM | POA: Diagnosis not present

## 2017-09-22 DIAGNOSIS — S80812A Abrasion, left lower leg, initial encounter: Secondary | ICD-10-CM | POA: Diagnosis not present

## 2017-10-23 DIAGNOSIS — F330 Major depressive disorder, recurrent, mild: Secondary | ICD-10-CM | POA: Diagnosis not present

## 2017-10-23 DIAGNOSIS — F329 Major depressive disorder, single episode, unspecified: Secondary | ICD-10-CM | POA: Diagnosis not present

## 2017-11-11 ENCOUNTER — Encounter: Payer: Self-pay | Admitting: Emergency Medicine

## 2017-11-11 ENCOUNTER — Emergency Department
Admission: EM | Admit: 2017-11-11 | Discharge: 2017-11-11 | Disposition: A | Discharge status: Home / Self Care | Payer: PPO | Source: Home / Self Care | Attending: Emergency Medicine | Admitting: Emergency Medicine

## 2017-11-11 ENCOUNTER — Emergency Department: Payer: PPO

## 2017-11-11 ENCOUNTER — Other Ambulatory Visit: Payer: Self-pay

## 2017-11-11 DIAGNOSIS — I249 Acute ischemic heart disease, unspecified: Secondary | ICD-10-CM | POA: Diagnosis not present

## 2017-11-11 DIAGNOSIS — Z7902 Long term (current) use of antithrombotics/antiplatelets: Secondary | ICD-10-CM | POA: Diagnosis not present

## 2017-11-11 DIAGNOSIS — I110 Hypertensive heart disease with heart failure: Secondary | ICD-10-CM | POA: Diagnosis present

## 2017-11-11 DIAGNOSIS — J811 Chronic pulmonary edema: Secondary | ICD-10-CM | POA: Diagnosis not present

## 2017-11-11 DIAGNOSIS — I509 Heart failure, unspecified: Secondary | ICD-10-CM | POA: Diagnosis not present

## 2017-11-11 DIAGNOSIS — I248 Other forms of acute ischemic heart disease: Secondary | ICD-10-CM | POA: Diagnosis present

## 2017-11-11 DIAGNOSIS — I10 Essential (primary) hypertension: Secondary | ICD-10-CM | POA: Insufficient documentation

## 2017-11-11 DIAGNOSIS — R0602 Shortness of breath: Secondary | ICD-10-CM

## 2017-11-11 DIAGNOSIS — E079 Disorder of thyroid, unspecified: Secondary | ICD-10-CM | POA: Diagnosis present

## 2017-11-11 DIAGNOSIS — Z955 Presence of coronary angioplasty implant and graft: Secondary | ICD-10-CM | POA: Diagnosis not present

## 2017-11-11 DIAGNOSIS — I447 Left bundle-branch block, unspecified: Secondary | ICD-10-CM | POA: Insufficient documentation

## 2017-11-11 DIAGNOSIS — I252 Old myocardial infarction: Secondary | ICD-10-CM | POA: Diagnosis not present

## 2017-11-11 DIAGNOSIS — I5032 Chronic diastolic (congestive) heart failure: Secondary | ICD-10-CM | POA: Diagnosis not present

## 2017-11-11 DIAGNOSIS — I5021 Acute systolic (congestive) heart failure: Secondary | ICD-10-CM | POA: Diagnosis not present

## 2017-11-11 DIAGNOSIS — R000 Tachycardia, unspecified: Secondary | ICD-10-CM | POA: Diagnosis present

## 2017-11-11 DIAGNOSIS — Z9841 Cataract extraction status, right eye: Secondary | ICD-10-CM | POA: Diagnosis not present

## 2017-11-11 DIAGNOSIS — Z79899 Other long term (current) drug therapy: Secondary | ICD-10-CM

## 2017-11-11 DIAGNOSIS — G4733 Obstructive sleep apnea (adult) (pediatric): Secondary | ICD-10-CM | POA: Diagnosis present

## 2017-11-11 DIAGNOSIS — I2510 Atherosclerotic heart disease of native coronary artery without angina pectoris: Secondary | ICD-10-CM

## 2017-11-11 DIAGNOSIS — Z8551 Personal history of malignant neoplasm of bladder: Secondary | ICD-10-CM | POA: Diagnosis not present

## 2017-11-11 DIAGNOSIS — Z961 Presence of intraocular lens: Secondary | ICD-10-CM | POA: Diagnosis present

## 2017-11-11 DIAGNOSIS — I471 Supraventricular tachycardia: Secondary | ICD-10-CM | POA: Diagnosis not present

## 2017-11-11 DIAGNOSIS — Z7982 Long term (current) use of aspirin: Secondary | ICD-10-CM

## 2017-11-11 DIAGNOSIS — I472 Ventricular tachycardia: Secondary | ICD-10-CM | POA: Diagnosis present

## 2017-11-11 DIAGNOSIS — K219 Gastro-esophageal reflux disease without esophagitis: Secondary | ICD-10-CM | POA: Diagnosis present

## 2017-11-11 DIAGNOSIS — I4892 Unspecified atrial flutter: Secondary | ICD-10-CM | POA: Diagnosis present

## 2017-11-11 DIAGNOSIS — Z79890 Hormone replacement therapy (postmenopausal): Secondary | ICD-10-CM | POA: Diagnosis not present

## 2017-11-11 DIAGNOSIS — R079 Chest pain, unspecified: Secondary | ICD-10-CM | POA: Diagnosis not present

## 2017-11-11 DIAGNOSIS — I5033 Acute on chronic diastolic (congestive) heart failure: Secondary | ICD-10-CM | POA: Diagnosis present

## 2017-11-11 DIAGNOSIS — J9621 Acute and chronic respiratory failure with hypoxia: Secondary | ICD-10-CM | POA: Diagnosis not present

## 2017-11-11 DIAGNOSIS — Z9842 Cataract extraction status, left eye: Secondary | ICD-10-CM | POA: Diagnosis not present

## 2017-11-11 DIAGNOSIS — E039 Hypothyroidism, unspecified: Secondary | ICD-10-CM | POA: Diagnosis present

## 2017-11-11 DIAGNOSIS — J9811 Atelectasis: Secondary | ICD-10-CM | POA: Diagnosis not present

## 2017-11-11 DIAGNOSIS — I4891 Unspecified atrial fibrillation: Secondary | ICD-10-CM | POA: Diagnosis present

## 2017-11-11 DIAGNOSIS — I501 Left ventricular failure: Secondary | ICD-10-CM | POA: Diagnosis not present

## 2017-11-11 DIAGNOSIS — R748 Abnormal levels of other serum enzymes: Secondary | ICD-10-CM | POA: Diagnosis not present

## 2017-11-11 LAB — HEPATIC FUNCTION PANEL
ALBUMIN: 3.6 g/dL (ref 3.5–5.0)
ALT: 18 U/L (ref 17–63)
AST: 17 U/L (ref 15–41)
Alkaline Phosphatase: 80 U/L (ref 38–126)
Bilirubin, Direct: 0.2 mg/dL (ref 0.1–0.5)
Indirect Bilirubin: 0.6 mg/dL (ref 0.3–0.9)
Total Bilirubin: 0.8 mg/dL (ref 0.3–1.2)
Total Protein: 6.4 g/dL — ABNORMAL LOW (ref 6.5–8.1)

## 2017-11-11 LAB — BASIC METABOLIC PANEL
Anion gap: 9 (ref 5–15)
BUN: 20 mg/dL (ref 6–20)
CHLORIDE: 107 mmol/L (ref 101–111)
CO2: 23 mmol/L (ref 22–32)
Calcium: 8.5 mg/dL — ABNORMAL LOW (ref 8.9–10.3)
Creatinine, Ser: 0.85 mg/dL (ref 0.61–1.24)
GFR calc non Af Amer: >60 mL/min (ref >60)
Glucose, Bld: 100 mg/dL — ABNORMAL HIGH (ref 65–99)
POTASSIUM: 3.7 mmol/L (ref 3.5–5.1)
SODIUM: 139 mmol/L (ref 135–145)

## 2017-11-11 LAB — CBC
HEMATOCRIT: 39.9 % — AB (ref 40.0–52.0)
HEMOGLOBIN: 13.9 g/dL (ref 13.0–18.0)
MCH: 32.5 pg (ref 26.0–34.0)
MCHC: 35 g/dL (ref 32.0–36.0)
MCV: 92.8 fL (ref 80.0–100.0)
Platelets: 301 10*3/uL (ref 150–440)
RBC: 4.3 MIL/uL — AB (ref 4.40–5.90)
RDW: 12.6 % (ref 11.5–14.5)
WBC: 11.3 10*3/uL — AB (ref 3.8–10.6)

## 2017-11-11 LAB — BRAIN NATRIURETIC PEPTIDE: B NATRIURETIC PEPTIDE 5: 442 pg/mL — AB (ref 0.0–100.0)

## 2017-11-11 LAB — TROPONIN I: Troponin I: 0.04 ng/mL (ref <0.03)

## 2017-11-11 MED ORDER — AMIODARONE HCL 200 MG PO TABS
200.0000 mg | ORAL_TABLET | Freq: Once | ORAL | Status: DC
  Filled 2017-11-11: qty 1

## 2017-11-11 MED ORDER — ADENOSINE 12 MG/4ML IV SOLN
INTRAVENOUS | Status: AC
  Filled 2017-11-11: qty 4

## 2017-11-11 MED ORDER — ADENOSINE 6 MG/2ML IV SOLN
6.0000 mg | Freq: Once | INTRAVENOUS | Status: AC
  Administered 2017-11-11: 6 mg via INTRAVENOUS
  Filled 2017-11-11: qty 2

## 2017-11-11 MED ORDER — ADENOSINE 12 MG/4ML IV SOLN
12.0000 mg | Freq: Once | INTRAVENOUS | Status: DC
  Filled 2017-11-11: qty 4

## 2017-11-11 MED ORDER — SODIUM CHLORIDE 0.9 % IV BOLUS (SEPSIS)
1000.0000 mL | Freq: Once | INTRAVENOUS | Status: AC
  Administered 2017-11-11: 1000 mL via INTRAVENOUS

## 2017-11-11 MED ORDER — AMIODARONE HCL 200 MG PO TABS
400.0000 mg | ORAL_TABLET | Freq: Once | ORAL | Status: AC
  Administered 2017-11-11: 400 mg via ORAL
  Filled 2017-11-11: qty 2

## 2017-11-11 MED ORDER — ADENOSINE 6 MG/2ML IV SOLN
INTRAVENOUS | Status: AC
  Filled 2017-11-11: qty 2

## 2017-11-11 MED ORDER — ETOMIDATE 2 MG/ML IV SOLN
10.0000 mg | Freq: Once | INTRAVENOUS | Status: DC
  Filled 2017-11-11: qty 10

## 2017-11-11 MED ORDER — SOTALOL HCL 80 MG PO TABS
80.0000 mg | ORAL_TABLET | Freq: Once | ORAL | Status: DC
  Filled 2017-11-11: qty 1

## 2017-11-11 MED ORDER — AMIODARONE HCL 200 MG PO TABS: 400.0000 mg | ORAL_TABLET | Freq: Every day | ORAL | 0 refills | Status: DC

## 2017-11-11 NOTE — Discharge Instructions (Signed)
Please begin taking her amiodarone 400 mg by mouth every day and Dr. Humphrey Rolls is expecting you at 9 AM this coming Monday for a recheck.  Please return to the emergency department sooner for any concerns whatsoever.  It was a pleasure to take care of you today, and thank you for coming to our emergency department.  If you have any questions or concerns before leaving please ask the nurse to grab me and I'm more than happy to go through your aftercare instructions again.  If you were prescribed any opioid pain medication today such as Norco, Vicodin, Percocet, morphine, hydrocodone, or oxycodone please make sure you do not drive when you are taking this medication as it can alter your ability to drive safely.  If you have any concerns once you are home that you are not improving or are in fact getting worse before you can make it to your follow-up appointment, please do not hesitate to call 911 and come back for further evaluation.  Darel Hong, MD  Results for orders placed or performed during the hospital encounter of 78/58/85  Basic metabolic panel  Result Value Ref Range   Sodium 139 135 - 145 mmol/L   Potassium 3.7 3.5 - 5.1 mmol/L   Chloride 107 101 - 111 mmol/L   CO2 23 22 - 32 mmol/L   Glucose, Bld 100 (H) 65 - 99 mg/dL   BUN 20 6 - 20 mg/dL   Creatinine, Ser 0.85 0.61 - 1.24 mg/dL   Calcium 8.5 (L) 8.9 - 10.3 mg/dL   GFR calc non Af Amer >60 >60 mL/min   GFR calc Af Amer >60 >60 mL/min   Anion gap 9 5 - 15  CBC  Result Value Ref Range   WBC 11.3 (H) 3.8 - 10.6 K/uL   RBC 4.30 (L) 4.40 - 5.90 MIL/uL   Hemoglobin 13.9 13.0 - 18.0 g/dL   HCT 39.9 (L) 40.0 - 52.0 %   MCV 92.8 80.0 - 100.0 fL   MCH 32.5 26.0 - 34.0 pg   MCHC 35.0 32.0 - 36.0 g/dL   RDW 12.6 11.5 - 14.5 %   Platelets 301 150 - 440 K/uL  Troponin I  Result Value Ref Range   Troponin I 0.04 (HH) <0.03 ng/mL  Brain natriuretic peptide  Result Value Ref Range   B Natriuretic Peptide 442.0 (H) 0.0 - 100.0 pg/mL    Hepatic function panel  Result Value Ref Range   Total Protein 6.4 (L) 6.5 - 8.1 g/dL   Albumin 3.6 3.5 - 5.0 g/dL   AST 17 15 - 41 U/L   ALT 18 17 - 63 U/L   Alkaline Phosphatase 80 38 - 126 U/L   Total Bilirubin 0.8 0.3 - 1.2 mg/dL   Bilirubin, Direct 0.2 0.1 - 0.5 mg/dL   Indirect Bilirubin 0.6 0.3 - 0.9 mg/dL   Dg Chest Port 1 View  Result Date: 11/11/2017 CLINICAL DATA:  Sinus and chest discomfort for scar. Central chest pain. EXAM: PORTABLE CHEST 1 VIEW COMPARISON:  08/16/2015 FINDINGS: The heart size and mediastinal contours are within normal limits. Minimal aortic atherosclerosis at the arch without aneurysm. Mild interstitial edema. No significant pleural effusion. The visualized skeletal structures are unremarkable. IMPRESSION: 1. Mild interstitial edema. 2. Aortic atherosclerosis. Electronically Signed   By: Ashley Royalty M.D.   On: 11/11/2017 17:54

## 2017-11-11 NOTE — ED Triage Notes (Signed)
Shortness of breath and chest discomfort for a few weeks pt states the pain is in the central chest and states it feels like something is stuck.  Pt states he has similar episodes.

## 2017-11-11 NOTE — ED Provider Notes (Signed)
[facility] Emergency Department Provider Note  ____________________________________________   First MD Initiated Contact with Patient 11/11/17 1705     (approximate)  I have reviewed the triage vital signs and the nursing notes.   HISTORY  Chief Complaint Shortness of Breath and Chest Pain    HPI Nathaniel Ibarra is a 76 y.o. male who comes to the emergency department with several weeks of generalized malaise although acutely worse for the past 12 hours or so.  He feels like his heart is racing and he is somewhat short of breath.  He has no pain.  His symptoms are worse with exertion and somewhat improved with rest.  His malaise is moderate severity.  It is uncomfortable.  He has a past medical history of previous myocardial infarction and is status post stenting.  His cardiologist is Dr. Humphrey Rolls.  He has no known history of CHF and has never had an arrhythmia.  Past Medical History:  Diagnosis Date  . Bladder cancer ([name])   . BPH (benign prostatic hyperplasia)   . GERD (gastroesophageal reflux disease)   . Hypertension   . Myocardial infarction [facility])    cath with stent in 2016  . Thyroid disease     Patient Active Problem List   Diagnosis Date Noted  . Coronary artery disease 09/02/2015  . Acute coronary syndrome ([name]) 09/01/2015  . Unstable angina ([name]) 09/01/2015  . Chest pain 08/16/2015    Past Surgical History:  Procedure Laterality Date  . CARDIAC CATHETERIZATION N/A 09/01/2015   Procedure: Left Heart Cath and Coronary Angiography;  Surgeon: Dionisio David, MD;  Location: [place] CV LAB;  Service: Cardiovascular;  Laterality: N/A;  . CARDIAC CATHETERIZATION N/A 09/01/2015   Procedure: Coronary Stent Intervention;  Surgeon: Yolonda Kida, MD;  Location: [place] CV [facility];  Service: Cardiovascular;  Laterality: N/A;  . CATARACT EXTRACTION W/PHACO Left 11/23/2016   Procedure: CATARACT EXTRACTION PHACO AND INTRAOCULAR LENS PLACEMENT  (IOC);  Surgeon: Leandrew Koyanagi, MD;  Location: [place];  Service: Ophthalmology;  Laterality: Left;  . CATARACT EXTRACTION W/PHACO Right 02/01/2017   Procedure: CATARACT EXTRACTION PHACO AND INTRAOCULAR LENS PLACEMENT ([place]) Complicated  right toric lens;  Surgeon: Leandrew Koyanagi, MD;  Location: [place];  Service: Ophthalmology;  Laterality: Right;  Malyugin toric Lens    Prior to Admission medications   Medication Sig Start Date End Date Taking? Authorizing Provider  amLODipine (NORVASC) 5 MG tablet Take 5 mg by mouth every Monday, Wednesday, and Friday.  05/09/15  Yes [provider]  aspirin EC 325 MG EC tablet Take 1 tablet (325 mg total) by mouth daily. Patient taking differently: Take 81 mg by mouth daily.  09/02/15  Yes Barnabas Harries, PA-C  buPROPion (WELLBUTRIN XL) 150 MG 24 hr tablet Take 150 mg by mouth daily.   Yes [provider]  finasteride (PROSCAR) 5 MG tablet Take 5 mg by mouth daily. 08/03/15  Yes [provider]  levothyroxine (SYNTHROID, LEVOTHROID) 50 MCG tablet Take 50 mcg by mouth every morning. 07/12/15  Yes [provider]  pantoprazole (PROTONIX) 40 MG tablet Take 40 mg by mouth daily as needed.    Yes [provider]  tamsulosin (FLOMAX) 0.4 MG CAPS capsule Take 0.4 mg by mouth 2 (two) times daily.  08/01/15  Yes [provider]  amiodarone (PACERONE) 200 MG tablet Take 2 tablets (400 mg total) by mouth daily. 11/11/17   Darel Hong, MD  clopidogrel (PLAVIX) 75  MG tablet Take 1 tablet (75 mg total) by mouth once. Patient not taking: Reported on 11/11/2017 09/02/15   Jasmine Pang A, PA-C  ibuprofen (MOTRIN IB) 200 MG tablet Take 1 tablet (200 mg total) by mouth every 6 (six) hours as needed. Patient not taking: Reported on 11/11/2017 08/17/15   Epifanio Lesches, MD    Allergies Patient has no known allergies.  Family History  Problem Relation Age of Onset  . Emphysema  Mother   . Emphysema Father     Social History Social History   Tobacco Use  . Smoking status: Never Smoker  . Smokeless tobacco: Never Used  Substance Use Topics  . Alcohol use: No  . Drug use: No    Review of Systems Constitutional: No fever/chills Eyes: No visual changes. ENT: No sore throat. Cardiovascular: Denies chest pain. Respiratory: Positive for shortness of breath. Gastrointestinal: No abdominal pain.  No nausea, no vomiting.  No diarrhea.  No constipation. Genitourinary: Negative for dysuria. Musculoskeletal: Negative for back pain. Skin: Negative for rash. Neurological: Negative for headaches, focal weakness or numbness.   ____________________________________________   PHYSICAL EXAM:  VITAL SIGNS: ED Triage Vitals  Enc Vitals Group     BP 11/11/17 1700 133/80     Pulse Rate 11/11/17 1700 (!) 155     Resp 11/11/17 1700 (!) 30     Temp 11/11/17 1700 99.5 F (37.5 C)     Temp Source 11/11/17 1700 Oral     SpO2 11/11/17 1700 91 %     Weight 11/11/17 1647 200 lb (90.7 kg)     Height 11/11/17 1647 6' (1.829 m)     Head Circumference --      Peak Flow --      Pain Score 11/11/17 1647 7     Pain Loc --      Pain Edu? --      Excl. in [place]? --     Constitutional: Alert and oriented x4 joking laughing pleasant cooperative speaks in full clear sentences Eyes: PERRL EOMI. Head: Atraumatic. Nose: No congestion/rhinnorhea. Mouth/Throat: No trismus Neck: No stridor.   Cardiovascular: Tachycardic but regularly regular grossly normal heart sounds.  Good peripheral circulation. Respiratory: Normal respiratory effort.  No retractions. Lungs CTAB and moving good air Gastrointestinal: Soft nontender Musculoskeletal: No lower extremity edema legs are equal in size Neurologic:  Normal speech and language. No gross focal neurologic deficits are appreciated. Skin:  Skin is warm, dry and intact. No rash noted. Psychiatric: Mood and affect are normal. Speech and  behavior are normal.    ____________________________________________   DIFFERENTIAL includes but not limited to  Ventricular tachycardia, SVT with aberrancy, metabolic derangement, acute coronary syndrome ____________________________________________   LABS (all labs ordered are listed, but only abnormal results are displayed)  Labs Reviewed  BASIC METABOLIC PANEL - Abnormal; Notable for the following components:      Result Value   Glucose, Bld 100 (*)    Calcium 8.5 (*)    All other components within normal limits  CBC - Abnormal; Notable for the following components:   WBC 11.3 (*)    RBC 4.30 (*)    HCT 39.9 (*)    All other components within normal limits  TROPONIN I - Abnormal; Notable for the following components:   Troponin I 0.04 (*)    All other components within normal limits  BRAIN NATRIURETIC PEPTIDE - Abnormal; Notable for the following components:   B Natriuretic Peptide 442.0 (*)    All  other components within normal limits  HEPATIC FUNCTION PANEL - Abnormal; Notable for the following components:   Total Protein 6.4 (*)    All other components within normal limits    Lab work reviewed by me shows slightly elevated troponin which is nonspecific but could be secondary to demand.  Elevated BNP is consistent with failure __________________________________________  EKG  ED ECG REPORT I, Darel Hong, the attending physician, personally viewed and interpreted this ECG.  Date: 11/11/2017 EKG Time:  Rate: 155 Rhythm: Regular wide-complex tachycardia QRS Axis: Leftward Intervals: normal ST/T Wave abnormalities: normal Narrative Interpretation: EKG could be consistent with SVT with aberrancy versus ventricular tachycardia  ____________________________________________  RADIOLOGY  Chest x-ray reviewed by me shows mild interstitial edema ____________________________________________   PROCEDURES  Procedure(s) performed:  yes  .Cardioversion Date/Time: 11/11/2017 5:20 PM Performed by: Darel Hong, MD Authorized by: Darel Hong, MD   Consent:    Consent obtained:  Verbal   Consent given by:  Patient   Risks discussed:  Induced arrhythmia and pain   Alternatives discussed:  Rate-control medication, delayed treatment and no treatment Pre-procedure details:    Cardioversion basis:  Emergent   Rhythm:  Ventricular tachycardia   Electrode placement:  Anterior-posterior Patient sedated: No Comments:     6mg  adenosine rapid IV push terminated the arrhythmia       Critical Care performed: yes  CRITICAL CARE Performed by: Darel Hong   Total critical care time: 45 minutes  Critical care time was exclusive of separately billable procedures and treating other patients.  Critical care was necessary to treat or prevent imminent or life-threatening deterioration.  Critical care was time spent personally by me on the following activities: development of treatment plan with patient and/or surrogate as well as nursing, discussions with consultants, evaluation of patient's response to treatment, examination of patient, obtaining history from patient or surrogate, ordering and performing treatments and interventions, ordering and review of laboratory studies, ordering and review of radiographic studies, pulse oximetry and re-evaluation of patient's condition.   Observation: no ____________________________________________   INITIAL IMPRESSION / ASSESSMENT AND PLAN / ED COURSE  Pertinent labs & imaging results that were available during my care of the patient were reviewed by me and considered in my medical decision making (see chart for details).  On arrival the patient has a wide-complex tachycardia to 155 and is exquisitely regular.  On chart review he does have a known left bundle branch block.  Unclear if this is SVT with aberrancy versus ventricular tachycardia.  As he has a normal blood  pressure of put pads on him and we will give a trial of adenosine first.     ----------------------------------------- 5:19 PM on 11/11/2017 -----------------------------------------  On arrival the patient had an exquisitely regular wide-complex tachycardia to 155.  Her quick chart review the patient has an old left bundle branch block with a history of myocardial infarction.  I discussed with the patient V. tach versus SVT with aberrancy and initially gave him 6 mg of adenosine IV push which converted him back to what appears to be atrial flutter with a 4-1 block.  The rhythm strip during adenosine seems like it is uncovered flutter waves.  His blood pressure was normal before and is normal after.  I have a call out to his cardiologist Dr. Humphrey Rolls to discuss. ____________________________________________   ----------------------------------------- 6:05 PM on 11/11/2017 -----------------------------------------  We have been unable to get in touch with Dr. Humphrey Rolls so I reached out to Dr. Ubaldo Glassing of  [facility] as they sometimes cover for him and Dr. Ubaldo Glassing indicated they are not taking call for Dr. Humphrey Rolls.   I discussed the case with the patient's cardiologist Dr. Humphrey Rolls who reviewed the EKG and believes this to be SVT with aberrancy and not ventricular tachycardia.  He recommends amiodarone 400 mg by mouth a day and to follow-up in clinic this coming Monday at 9 AM.  The patient is asymptomatic at this point and agrees with the plan.  Slightly elevated troponin is not consistent with primary myocardial ischemia and is more suggestive of prolonged tachycardia.  At this point the patient is medically stable for outpatient management.  FINAL CLINICAL IMPRESSION(S) / ED DIAGNOSES  Final diagnoses:  SVT (supraventricular tachycardia) (HCC)  Left bundle branch block      NEW MEDICATIONS STARTED DURING THIS VISIT:  This SmartLink is deprecated. Use AVSMEDLIST instead to display the medication list  for a patient.   Note:  This document was prepared using Dragon voice recognition software and may include unintentional dictation errors.     Darel Hong, MD 11/12/17 1521

## 2017-11-12 ENCOUNTER — Inpatient Hospital Stay
Admission: EM | Admit: 2017-11-12 | Discharge: 2017-11-17 | DRG: 286 | Disposition: A | Discharge status: Home / Self Care | Payer: PPO | Attending: Internal Medicine | Admitting: Internal Medicine

## 2017-11-12 DIAGNOSIS — I472 Ventricular tachycardia: Secondary | ICD-10-CM

## 2017-11-12 DIAGNOSIS — E039 Hypothyroidism, unspecified: Secondary | ICD-10-CM | POA: Diagnosis present

## 2017-11-12 DIAGNOSIS — I501 Left ventricular failure: Secondary | ICD-10-CM

## 2017-11-12 DIAGNOSIS — E079 Disorder of thyroid, unspecified: Secondary | ICD-10-CM | POA: Diagnosis present

## 2017-11-12 DIAGNOSIS — J9690 Respiratory failure, unspecified, unspecified whether with hypoxia or hypercapnia: Secondary | ICD-10-CM

## 2017-11-12 DIAGNOSIS — I252 Old myocardial infarction: Secondary | ICD-10-CM

## 2017-11-12 DIAGNOSIS — G4733 Obstructive sleep apnea (adult) (pediatric): Secondary | ICD-10-CM | POA: Diagnosis present

## 2017-11-12 DIAGNOSIS — I2510 Atherosclerotic heart disease of native coronary artery without angina pectoris: Secondary | ICD-10-CM | POA: Diagnosis present

## 2017-11-12 DIAGNOSIS — K219 Gastro-esophageal reflux disease without esophagitis: Secondary | ICD-10-CM | POA: Diagnosis present

## 2017-11-12 DIAGNOSIS — Z9841 Cataract extraction status, right eye: Secondary | ICD-10-CM

## 2017-11-12 DIAGNOSIS — R778 Other specified abnormalities of plasma proteins: Secondary | ICD-10-CM

## 2017-11-12 DIAGNOSIS — Z7982 Long term (current) use of aspirin: Secondary | ICD-10-CM

## 2017-11-12 DIAGNOSIS — R7989 Other specified abnormal findings of blood chemistry: Secondary | ICD-10-CM

## 2017-11-12 DIAGNOSIS — R000 Tachycardia, unspecified: Secondary | ICD-10-CM

## 2017-11-12 DIAGNOSIS — I248 Other forms of acute ischemic heart disease: Secondary | ICD-10-CM | POA: Diagnosis present

## 2017-11-12 DIAGNOSIS — Z955 Presence of coronary angioplasty implant and graft: Secondary | ICD-10-CM

## 2017-11-12 DIAGNOSIS — Z7902 Long term (current) use of antithrombotics/antiplatelets: Secondary | ICD-10-CM

## 2017-11-12 DIAGNOSIS — I5033 Acute on chronic diastolic (congestive) heart failure: Secondary | ICD-10-CM | POA: Diagnosis present

## 2017-11-12 DIAGNOSIS — I509 Heart failure, unspecified: Secondary | ICD-10-CM

## 2017-11-12 DIAGNOSIS — J80 Acute respiratory distress syndrome: Secondary | ICD-10-CM

## 2017-11-12 DIAGNOSIS — I4892 Unspecified atrial flutter: Secondary | ICD-10-CM | POA: Diagnosis present

## 2017-11-12 DIAGNOSIS — I4891 Unspecified atrial fibrillation: Principal | ICD-10-CM

## 2017-11-12 DIAGNOSIS — Z79890 Hormone replacement therapy (postmenopausal): Secondary | ICD-10-CM

## 2017-11-12 DIAGNOSIS — R079 Chest pain, unspecified: Secondary | ICD-10-CM | POA: Diagnosis not present

## 2017-11-12 DIAGNOSIS — Z9842 Cataract extraction status, left eye: Secondary | ICD-10-CM

## 2017-11-12 DIAGNOSIS — Z961 Presence of intraocular lens: Secondary | ICD-10-CM | POA: Diagnosis present

## 2017-11-12 DIAGNOSIS — J811 Chronic pulmonary edema: Secondary | ICD-10-CM | POA: Diagnosis not present

## 2017-11-12 DIAGNOSIS — Z79899 Other long term (current) drug therapy: Secondary | ICD-10-CM

## 2017-11-12 DIAGNOSIS — Z8551 Personal history of malignant neoplasm of bladder: Secondary | ICD-10-CM

## 2017-11-12 DIAGNOSIS — I110 Hypertensive heart disease with heart failure: Secondary | ICD-10-CM | POA: Diagnosis present

## 2017-11-12 DIAGNOSIS — J9621 Acute and chronic respiratory failure with hypoxia: Secondary | ICD-10-CM | POA: Diagnosis not present

## 2017-11-12 DIAGNOSIS — R748 Abnormal levels of other serum enzymes: Secondary | ICD-10-CM | POA: Diagnosis not present

## 2017-11-12 LAB — BASIC METABOLIC PANEL
ANION GAP: 8 (ref 5–15)
BUN: 22 mg/dL — AB (ref 6–20)
CO2: 24 mmol/L (ref 22–32)
Calcium: 8.2 mg/dL — ABNORMAL LOW (ref 8.9–10.3)
Chloride: 106 mmol/L (ref 101–111)
Creatinine, Ser: 0.89 mg/dL (ref 0.61–1.24)
GFR calc Af Amer: >60 mL/min (ref >60)
GFR calc non Af Amer: >60 mL/min (ref >60)
GLUCOSE: 130 mg/dL — AB (ref 65–99)
Potassium: 3.9 mmol/L (ref 3.5–5.1)
Sodium: 138 mmol/L (ref 135–145)

## 2017-11-12 LAB — CBC
HCT: 39.8 % — ABNORMAL LOW (ref 40.0–52.0)
HEMOGLOBIN: 13.7 g/dL (ref 13.0–18.0)
MCH: 32.1 pg (ref 26.0–34.0)
MCHC: 34.4 g/dL (ref 32.0–36.0)
MCV: 93.3 fL (ref 80.0–100.0)
Platelets: 293 10*3/uL (ref 150–440)
RBC: 4.26 MIL/uL — ABNORMAL LOW (ref 4.40–5.90)
RDW: 12.8 % (ref 11.5–14.5)
WBC: 10 10*3/uL (ref 3.8–10.6)

## 2017-11-12 LAB — TSH: TSH: 5.604 u[IU]/mL — ABNORMAL HIGH (ref 0.350–4.500)

## 2017-11-12 LAB — TROPONIN I
TROPONIN I: 0.04 ng/mL — AB (ref <0.03)
Troponin I: 0.03 ng/mL (ref <0.03)

## 2017-11-12 MED ORDER — AMIODARONE HCL IN DEXTROSE 360-4.14 MG/200ML-% IV SOLN
60.0000 mg/h | INTRAVENOUS | Status: AC
  Administered 2017-11-12: 60 mg/h via INTRAVENOUS
  Filled 2017-11-12: qty 200

## 2017-11-12 MED ORDER — AMIODARONE HCL IN DEXTROSE 360-4.14 MG/200ML-% IV SOLN
30.0000 mg/h | INTRAVENOUS | Status: DC
  Administered 2017-11-12 – 2017-11-14 (×5): 30 mg/h via INTRAVENOUS
  Filled 2017-11-12 (×5): qty 200

## 2017-11-12 MED ORDER — DOCUSATE SODIUM 100 MG PO CAPS
100.0000 mg | ORAL_CAPSULE | Freq: Two times a day (BID) | ORAL | Status: DC
  Administered 2017-11-12 – 2017-11-15 (×5): 100 mg via ORAL
  Filled 2017-11-12 (×5): qty 1

## 2017-11-12 MED ORDER — NITROGLYCERIN 0.4 MG SL SUBL: 0.4000 mg | SUBLINGUAL_TABLET | SUBLINGUAL | Status: DC | PRN

## 2017-11-12 MED ORDER — POTASSIUM CHLORIDE CRYS ER 20 MEQ PO TBCR
20.0000 meq | EXTENDED_RELEASE_TABLET | Freq: Two times a day (BID) | ORAL | Status: DC
  Administered 2017-11-12 – 2017-11-15 (×4): 20 meq via ORAL
  Filled 2017-11-12 (×4): qty 1

## 2017-11-12 MED ORDER — ASPIRIN EC 81 MG PO TBEC
81.0000 mg | DELAYED_RELEASE_TABLET | Freq: Every day | ORAL | Status: DC
  Administered 2017-11-13 – 2017-11-17 (×4): 81 mg via ORAL
  Filled 2017-11-12 (×4): qty 1

## 2017-11-12 MED ORDER — SODIUM CHLORIDE 0.9 % IV SOLN
INTRAVENOUS | Status: DC
  Administered 2017-11-12 – 2017-11-15 (×5): via INTRAVENOUS

## 2017-11-12 MED ORDER — AMIODARONE IV BOLUS ONLY 150 MG/100ML
150.0000 mg | Freq: Once | INTRAVENOUS | Status: AC
  Administered 2017-11-12: 150 mg via INTRAVENOUS
  Filled 2017-11-12: qty 100

## 2017-11-12 MED ORDER — BUPROPION HCL ER (XL) 150 MG PO TB24
150.0000 mg | ORAL_TABLET | Freq: Every day | ORAL | Status: DC
  Administered 2017-11-15 – 2017-11-16 (×2): 150 mg via ORAL
  Filled 2017-11-12 (×4): qty 1

## 2017-11-12 MED ORDER — TAMSULOSIN HCL 0.4 MG PO CAPS
0.4000 mg | ORAL_CAPSULE | Freq: Two times a day (BID) | ORAL | Status: DC
  Administered 2017-11-12 – 2017-11-17 (×8): 0.4 mg via ORAL
  Filled 2017-11-12 (×8): qty 1

## 2017-11-12 MED ORDER — ONDANSETRON HCL 4 MG/2ML IJ SOLN
4.0000 mg | Freq: Four times a day (QID) | INTRAMUSCULAR | Status: DC | PRN
  Administered 2017-11-14: 4 mg via INTRAVENOUS
  Filled 2017-11-12: qty 2

## 2017-11-12 MED ORDER — LEVOTHYROXINE SODIUM 50 MCG PO TABS: 50.0000 ug | ORAL_TABLET | ORAL | Status: DC

## 2017-11-12 MED ORDER — DEXTROSE 5 % IV SOLN: 60.0000 mg/h | Freq: Once | INTRAVENOUS | Status: DC

## 2017-11-12 MED ORDER — NITROGLYCERIN 2 % TD OINT
0.5000 [in_us] | TOPICAL_OINTMENT | Freq: Four times a day (QID) | TRANSDERMAL | Status: DC
  Administered 2017-11-12 – 2017-11-17 (×11): 0.5 [in_us] via TOPICAL
  Filled 2017-11-12 (×12): qty 1

## 2017-11-12 MED ORDER — ONDANSETRON HCL 4 MG PO TABS: 4.0000 mg | ORAL_TABLET | Freq: Four times a day (QID) | ORAL | Status: DC | PRN

## 2017-11-12 MED ORDER — ENOXAPARIN SODIUM 40 MG/0.4ML ~~LOC~~ SOLN
40.0000 mg | SUBCUTANEOUS | Status: DC
  Administered 2017-11-12 – 2017-11-13 (×2): 40 mg via SUBCUTANEOUS
  Filled 2017-11-12 (×2): qty 0.4

## 2017-11-12 MED ORDER — HEPARIN (PORCINE) IN NACL 100-0.45 UNIT/ML-% IJ SOLN: 12.0000 [IU]/kg/h | Freq: Once | INTRAMUSCULAR | Status: DC

## 2017-11-12 MED ORDER — ACETAMINOPHEN 650 MG RE SUPP: 650.0000 mg | Freq: Four times a day (QID) | RECTAL | Status: DC | PRN

## 2017-11-12 MED ORDER — MORPHINE SULFATE (PF) 2 MG/ML IV SOLN
2.0000 mg | INTRAVENOUS | Status: DC | PRN
  Administered 2017-11-13: 2 mg via INTRAVENOUS
  Filled 2017-11-12: qty 1

## 2017-11-12 MED ORDER — BISACODYL 10 MG RE SUPP: 10.0000 mg | Freq: Every day | RECTAL | Status: DC | PRN

## 2017-11-12 MED ORDER — FINASTERIDE 5 MG PO TABS
5.0000 mg | ORAL_TABLET | Freq: Every day | ORAL | Status: DC
  Administered 2017-11-13 – 2017-11-17 (×4): 5 mg via ORAL
  Filled 2017-11-12 (×4): qty 1

## 2017-11-12 MED ORDER — FUROSEMIDE 10 MG/ML IJ SOLN
40.0000 mg | Freq: Two times a day (BID) | INTRAMUSCULAR | Status: DC
  Administered 2017-11-12 – 2017-11-17 (×10): 40 mg via INTRAVENOUS
  Filled 2017-11-12 (×9): qty 4

## 2017-11-12 MED ORDER — ACETAMINOPHEN 325 MG PO TABS: 650.0000 mg | ORAL_TABLET | Freq: Four times a day (QID) | ORAL | Status: DC | PRN

## 2017-11-12 MED ORDER — PANTOPRAZOLE SODIUM 40 MG PO TBEC
40.0000 mg | DELAYED_RELEASE_TABLET | Freq: Two times a day (BID) | ORAL | Status: DC
  Administered 2017-11-12 – 2017-11-15 (×4): 40 mg via ORAL
  Filled 2017-11-12 (×4): qty 1

## 2017-11-12 NOTE — ED Provider Notes (Signed)
[facility] Emergency Department Provider Note       Time seen: ----------------------------------------- 3:35 PM on 11/12/2017 -----------------------------------------   I have reviewed the triage vital signs and the nursing notes.  HISTORY   Chief Complaint Irregular Heart Beat    HPI Nathaniel Ibarra is a 76 y.o. male with a history of coronary artery disease who presents to the ED for irregular heartbeat.  Patient was seen here yesterday for same.  He cannot tell that his heart is racing and just complains of generalized malaise.  He was given adenosine and then subsequently amiodarone.  He denies any pain right now, he reports his heart rates been up since about 8 AM he was hoping it would convert back.  Currently the heartbeat is 148.  He was supposed to follow-up with cardiology tomorrow.  Past Medical History:  Diagnosis Date  . Bladder cancer ([place])   . BPH (benign prostatic hyperplasia)   . GERD (gastroesophageal reflux disease)   . Hypertension   . Myocardial infarction [facility])    cath with stent in 2016  . Thyroid disease     Patient Active Problem List   Diagnosis Date Noted  . Coronary artery disease 09/02/2015  . Acute coronary syndrome ([place]) 09/01/2015  . Unstable angina ([place]) 09/01/2015  . Chest pain 08/16/2015    Past Surgical History:  Procedure Laterality Date  . CARDIAC CATHETERIZATION N/A 09/01/2015   Procedure: Left Heart Cath and Coronary Angiography;  Surgeon: Dionisio David, MD;  Location: [place] CV LAB;  Service: Cardiovascular;  Laterality: N/A;  . CARDIAC CATHETERIZATION N/A 09/01/2015   Procedure: Coronary Stent Intervention;  Surgeon: Yolonda Kida, MD;  Location: [place] CV LAB;  Service: Cardiovascular;  Laterality: N/A;  . CATARACT EXTRACTION W/PHACO Left 11/23/2016   Procedure: CATARACT EXTRACTION PHACO AND INTRAOCULAR LENS PLACEMENT (IOC);  Surgeon: Leandrew Koyanagi, MD;  Location: [place];  Service: Ophthalmology;  Laterality: Left;  . CATARACT EXTRACTION W/PHACO Right 02/01/2017   Procedure: CATARACT EXTRACTION PHACO AND INTRAOCULAR LENS PLACEMENT ([place]) Complicated  right toric lens;  Surgeon: Leandrew Koyanagi, MD;  Location: [place];  Service: Ophthalmology;  Laterality: Right;  Malyugin toric Lens    Allergies Patient has no known allergies.  Social History Social History   Tobacco Use  . Smoking status: Never Smoker  . Smokeless tobacco: Never Used  Substance Use Topics  . Alcohol use: No  . Drug use: No    Review of Systems Constitutional: Negative for fever. Eyes: Negative for vision changes ENT:  Negative for congestion, sore throat Cardiovascular: Negative for chest pain.  Positive for tachycardia Respiratory: Negative for shortness of breath. Gastrointestinal: Negative for abdominal pain, vomiting and diarrhea. Musculoskeletal: Negative for back pain. Skin: Negative for rash. Neurological: Negative for headaches, positive for generalized weakness  All systems negative/normal/unremarkable except as stated in the HPI  ____________________________________________   PHYSICAL EXAM:  VITAL SIGNS: ED Triage Vitals  Enc Vitals Group     BP 11/12/17 1520 118/81     Pulse Rate 11/12/17 1520 (!) 147     Resp --      Temp 11/12/17 1520 99.5 F (37.5 C)     Temp src --      SpO2 11/12/17 1520 94 %     Weight 11/12/17 1526 200 lb (90.7 kg)     Height 11/12/17 1526 6' (1.829 m)     Head Circumference --      Peak Flow --  Pain Score --      Pain Loc --      Pain Edu? --      Excl. in [place]? --     Constitutional: Alert and oriented. Well appearing and in no distress. Eyes: Conjunctivae are normal. Normal extraocular movements. ENT   Head: Normocephalic and atraumatic.   Nose: No congestion/rhinnorhea.   Mouth/Throat: Mucous membranes are moist.   Neck: No stridor. Cardiovascular: Rapid rate, regular  rhythm. No murmurs, rubs, or gallops. Respiratory: Normal respiratory effort without tachypnea nor retractions. Breath sounds are clear and equal bilaterally. No wheezes/rales/rhonchi. Gastrointestinal: Soft and nontender. Normal bowel sounds Musculoskeletal: Nontender with normal range of motion in extremities. No lower extremity tenderness nor edema. Neurologic:  Normal speech and language. No gross focal neurologic deficits are appreciated.  Skin:  Skin is warm, dry and intact. No rash noted. Psychiatric: Mood and affect are normal. Speech and behavior are normal.  ____________________________________________  EKG: Interpreted by me.  Wide-complex tachycardia with a rate of 147 bpm, normal axis, normal QT.  ____________________________________________  ED COURSE:  Pertinent labs & imaging results that were available during my care of the patient were reviewed by me and considered in my medical decision making (see chart for details). Patient presents for weakness and fast heartbeat, we will assess with labs and imaging as indicated.   Procedures ____________________________________________   LABS (pertinent positives/negatives)  Labs Reviewed  BASIC METABOLIC PANEL - Abnormal; Notable for the following components:      Result Value   Glucose, Bld 130 (*)    BUN 22 (*)    Calcium 8.2 (*)    All other components within normal limits  CBC - Abnormal; Notable for the following components:   RBC 4.26 (*)    HCT 39.8 (*)    All other components within normal limits  TROPONIN I - Abnormal; Notable for the following components:   Troponin I 0.04 (*)    All other components within normal limits   CRITICAL CARE Performed by: Earleen Newport   Total critical care time: 30 minutes  Critical care time was exclusive of separately billable procedures and treating other patients.  Critical care was necessary to treat or prevent imminent or life-threatening  deterioration.  Critical care was time spent personally by me on the following activities: development of treatment plan with patient and/or surrogate as well as nursing, discussions with consultants, evaluation of patient's response to treatment, examination of patient, obtaining history from patient or surrogate, ordering and performing treatments and interventions, ordering and review of laboratory studies, ordering and review of radiographic studies, pulse oximetry and re-evaluation of patient's condition.  ___________________________________________  DIFFERENTIAL DIAGNOSIS   V. tach, SVT with aberrancy, rapid A. Fib/flutter, electrolyte abnormality, dehydration  FINAL ASSESSMENT AND PLAN  Wide-complex tachycardia, atrial fibrillation/flutter   Plan: Patient had presented for tachydysrhythmia. Patient's labs were reassuring.  We gave an initial 150 mg bolus of amiodarone and subsequently put him on a drip of amiodarone.  He had transient rate control but then subsequently his heart rate returned to the 140s.  We gave an additional drip and thus far he has remained in a stable fib/flutter rhythm.  I will place him on a heparin drip and we will plan on admission.   Earleen Newport, MD   Note: This note was generated in part or whole with voice recognition software. Voice recognition is usually quite accurate but there are transcription errors that can and very often do occur.  I apologize for any typographical errors that were not detected and corrected.     Earleen Newport, MD 11/12/17 [phone]

## 2017-11-12 NOTE — ED Notes (Signed)
Pt NAD, Afib with controlled rate on Amnio drip. VSS. resp easy and equal. RN transporting to floor

## 2017-11-12 NOTE — ED Notes (Signed)
Called pharmacy and spoke with Willette Cluster and requested that drip sent to ED ASAP

## 2017-11-12 NOTE — ED Triage Notes (Addendum)
Pt came to ED via pov for irregular heart rate, seen here yesterday for same, was given adenosine, converted back and sent home with amiodarone prescription. Pt denies pain right now. Reports heart rate has been up since 8am, was hoping he would convert. HR 148 in triage.

## 2017-11-12 NOTE — H&P (Signed)
History and Physical    Nathaniel Ibarra OFB:[id number] DOB: [birth year] DOA: 11/12/2017  Referring physician: Dr. Jimmye Norman PCP: Albina Billet, MD  Specialists: Dr. Humphrey Rolls  Chief Complaint: CP and SOB with weakness  HPI: Nathaniel Ibarra is a 75 y.o. male has a past medical history significant for CAD, HTN, thyroid disease now with weakness and SOB with some chest discomfort. Found to be in rapid A-fib in ER now stable on Amiodarone drip. Sx's have improved. Troponin mildly elevated. He is now admitted. No fever. Denies N/V/D. Still feels weak and SOB but is pain-free.  Review of Systems: The patient denies anorexia, fever, weight loss,, vision loss, decreased hearing, hoarseness,  syncope,  peripheral edema, balance deficits, hemoptysis, abdominal pain, melena, hematochezia, severe indigestion/heartburn, hematuria, incontinence, genital sores, muscle weakness, suspicious skin lesions, transient blindness, difficulty walking, depression, unusual weight change, abnormal bleeding, enlarged lymph nodes, angioedema, and breast masses.   Past Medical History:  Diagnosis Date  . Bladder cancer ([place])   . BPH (benign prostatic hyperplasia)   . GERD (gastroesophageal reflux disease)   . Hypertension   . Myocardial infarction [facility])    cath with stent in 2016  . Thyroid disease    Past Surgical History:  Procedure Laterality Date  . CARDIAC CATHETERIZATION N/A 09/01/2015   Procedure: Left Heart Cath and Coronary Angiography;  Surgeon: Dionisio David, MD;  Location: [place] CV LAB;  Service: Cardiovascular;  Laterality: N/A;  . CARDIAC CATHETERIZATION N/A 09/01/2015   Procedure: Coronary Stent Intervention;  Surgeon: Yolonda Kida, MD;  Location: [place] CV LAB;  Service: Cardiovascular;  Laterality: N/A;  . CATARACT EXTRACTION W/PHACO Left 11/23/2016   Procedure: CATARACT EXTRACTION PHACO AND INTRAOCULAR LENS PLACEMENT (IOC);  Surgeon: Leandrew Koyanagi, MD;  Location: [place];  Service: Ophthalmology;  Laterality: Left;  . CATARACT EXTRACTION W/PHACO Right 02/01/2017   Procedure: CATARACT EXTRACTION PHACO AND INTRAOCULAR LENS PLACEMENT ([place]) Complicated  right toric lens;  Surgeon: Leandrew Koyanagi, MD;  Location: [place];  Service: Ophthalmology;  Laterality: Right;  Malyugin toric Lens   Social History:  reports that  has never smoked. he has never used smokeless tobacco. He reports that he does not drink alcohol or use drugs.  No Known Allergies  Family History  Problem Relation Age of Onset  . Emphysema Mother   . Emphysema Father     Prior to Admission medications   Medication Sig Start Date End Date Taking? Authorizing Provider  amiodarone (PACERONE) 200 MG tablet Take 2 tablets (400 mg total) by mouth daily. 11/11/17  Yes Darel Hong, MD  amLODipine (NORVASC) 5 MG tablet Take 5 mg by mouth every Monday, Wednesday, and Friday.  05/09/15  Yes [provider]  aspirin EC 325 MG EC tablet Take 1 tablet (325 mg total) by mouth daily. Patient taking differently: Take 81 mg by mouth daily.  09/02/15  Yes Barnabas Harries, PA-C  buPROPion (WELLBUTRIN XL) 150 MG 24 hr tablet Take 150 mg by mouth daily.   Yes [provider]  finasteride (PROSCAR) 5 MG tablet Take 5 mg by mouth daily. 08/03/15  Yes [provider]  levothyroxine (SYNTHROID, LEVOTHROID) 50 MCG tablet Take 50 mcg by mouth every morning. 07/12/15  Yes [provider]  pantoprazole (PROTONIX) 40 MG tablet Take 40 mg by mouth daily as needed.    Yes [provider]  tamsulosin (FLOMAX) 0.4 MG CAPS capsule Take 0.4 mg by mouth 2 (two) times daily.  08/01/15  Yes [provider]  clopidogrel (PLAVIX) 75 MG tablet Take 1 tablet (75 mg total) by mouth once. Patient not taking: Reported on 11/11/2017 09/02/15   Jasmine Pang A, PA-C  ibuprofen (MOTRIN IB) 200 MG tablet Take 1 tablet (200 mg total) by mouth every 6 (six) hours  as needed. Patient not taking: Reported on 11/11/2017 08/17/15   Epifanio Lesches, MD   Physical Exam: Vitals:   11/12/17 1520 11/12/17 1526 11/12/17 1630  BP: 118/81  (!) 131/93  Pulse: (!) 147  (!) 143  Resp:   20  Temp: 99.5 F (37.5 C)    SpO2: 94%  93%  Weight:  90.7 kg (200 lb)   Height:  6' (1.829 m)      General:  No apparent distress , WDWN, [place]/AT  Eyes: PERRL, EOMI, no scleral icterus, conjunctiva clear  ENT: moist oropharynx without exudate, TM's benign, dentition fair  Neck: supple, no lymphadenopathy. No bruits or thyromegaly  Cardiovascular: irregularly irregular without MRG; 2+ peripheral pulses, no JVD, no peripheral edema  Respiratory: basilar rales without wheezes or rhonchi. No dullness. Respiratory effort mildly increased  Abdomen: soft, non tender to palpation, positive bowel sounds, no guarding, no rebound  Skin: no rashes or lesions  Musculoskeletal: normal bulk and tone, no joint swelling  Psychiatric: normal mood and affect, A&OX3  Neurologic: CN 2-12 grossly intact, Motor strength 5/5 in all 4 groups with symmetric DTR's and non-focal sensory exam  Labs on Admission:  Basic Metabolic Panel: Recent Labs  Lab 11/11/17 1657 11/12/17 1524  NA 139 138  K 3.7 3.9  CL 107 106  CO2 23 24  GLUCOSE 100* 130*  BUN 20 22*  CREATININE 0.85 0.89  CALCIUM 8.5* 8.2*   Liver Function Tests: Recent Labs  Lab 11/11/17 1657  AST 17  ALT 18  ALKPHOS 80  BILITOT 0.8  PROT 6.4*  ALBUMIN 3.6   No results for input(s): LIPASE, AMYLASE in the last 168 hours. No results for input(s): AMMONIA in the last 168 hours. CBC: Recent Labs  Lab 11/11/17 1657 11/12/17 1524  WBC 11.3* 10.0  HGB 13.9 13.7  HCT 39.9* 39.8*  MCV 92.8 93.3  PLT 301 293   Cardiac Enzymes: Recent Labs  Lab 11/11/17 1657 11/12/17 1524  TROPONINI 0.04* 0.04*    BNP (last 3 results) Recent Labs    11/11/17 1657  BNP 442.0*    ProBNP (last 3 results) No  results for input(s): PROBNP in the last 8760 hours.  CBG: No results for input(s): GLUCAP in the last 168 hours.  Radiological Exams on Admission: Dg Chest Port 1 View  Result Date: 11/11/2017 CLINICAL DATA:  Sinus and chest discomfort for scar. Central chest pain. EXAM: PORTABLE CHEST 1 VIEW COMPARISON:  08/16/2015 FINDINGS: The heart size and mediastinal contours are within normal limits. Minimal aortic atherosclerosis at the arch without aneurysm. Mild interstitial edema. No significant pleural effusion. The visualized skeletal structures are unremarkable. IMPRESSION: 1. Mild interstitial edema. 2. Aortic atherosclerosis. Electronically Signed   By: Ashley Royalty M.D.   On: 11/11/2017 17:54    EKG: Independently reviewed.  Assessment/Plan Principal Problem:   Atrial fibrillation with RVR (HCC) Active Problems:   Chest pain   Pulmonary edema cardiac cause (HCC)   Elevated troponin   Will observe on telemetry with Amiodarone drip and follow enzymes. Check TSH. Order echo and Cardiology consult. Repeat labs in AM. Begin IV LAsix. Wean O2. CXR in AM  Diet: low salt  Fluids: NS@KVO  DVT Prophylaxis: Lovenox  Code Status: FULL  Family Communication: yes  Disposition Plan: home  Time spent: 50 min

## 2017-11-13 ENCOUNTER — Observation Stay: Payer: PPO

## 2017-11-13 DIAGNOSIS — Z9842 Cataract extraction status, left eye: Secondary | ICD-10-CM | POA: Diagnosis not present

## 2017-11-13 DIAGNOSIS — I248 Other forms of acute ischemic heart disease: Secondary | ICD-10-CM | POA: Diagnosis present

## 2017-11-13 DIAGNOSIS — I5021 Acute systolic (congestive) heart failure: Secondary | ICD-10-CM | POA: Diagnosis not present

## 2017-11-13 DIAGNOSIS — G4733 Obstructive sleep apnea (adult) (pediatric): Secondary | ICD-10-CM | POA: Diagnosis present

## 2017-11-13 DIAGNOSIS — I509 Heart failure, unspecified: Secondary | ICD-10-CM | POA: Diagnosis not present

## 2017-11-13 DIAGNOSIS — I5033 Acute on chronic diastolic (congestive) heart failure: Secondary | ICD-10-CM | POA: Diagnosis present

## 2017-11-13 DIAGNOSIS — Z7902 Long term (current) use of antithrombotics/antiplatelets: Secondary | ICD-10-CM | POA: Diagnosis not present

## 2017-11-13 DIAGNOSIS — Z79890 Hormone replacement therapy (postmenopausal): Secondary | ICD-10-CM | POA: Diagnosis not present

## 2017-11-13 DIAGNOSIS — I110 Hypertensive heart disease with heart failure: Secondary | ICD-10-CM | POA: Diagnosis present

## 2017-11-13 DIAGNOSIS — I252 Old myocardial infarction: Secondary | ICD-10-CM | POA: Diagnosis not present

## 2017-11-13 DIAGNOSIS — K219 Gastro-esophageal reflux disease without esophagitis: Secondary | ICD-10-CM | POA: Diagnosis present

## 2017-11-13 DIAGNOSIS — I472 Ventricular tachycardia: Secondary | ICD-10-CM | POA: Diagnosis present

## 2017-11-13 DIAGNOSIS — Z8551 Personal history of malignant neoplasm of bladder: Secondary | ICD-10-CM | POA: Diagnosis not present

## 2017-11-13 DIAGNOSIS — Z79899 Other long term (current) drug therapy: Secondary | ICD-10-CM | POA: Diagnosis not present

## 2017-11-13 DIAGNOSIS — J9621 Acute and chronic respiratory failure with hypoxia: Secondary | ICD-10-CM | POA: Diagnosis not present

## 2017-11-13 DIAGNOSIS — Z9841 Cataract extraction status, right eye: Secondary | ICD-10-CM | POA: Diagnosis not present

## 2017-11-13 DIAGNOSIS — E039 Hypothyroidism, unspecified: Secondary | ICD-10-CM | POA: Diagnosis present

## 2017-11-13 DIAGNOSIS — E079 Disorder of thyroid, unspecified: Secondary | ICD-10-CM | POA: Diagnosis present

## 2017-11-13 DIAGNOSIS — I501 Left ventricular failure: Secondary | ICD-10-CM | POA: Diagnosis not present

## 2017-11-13 DIAGNOSIS — Z961 Presence of intraocular lens: Secondary | ICD-10-CM | POA: Diagnosis present

## 2017-11-13 DIAGNOSIS — I4891 Unspecified atrial fibrillation: Secondary | ICD-10-CM | POA: Diagnosis present

## 2017-11-13 DIAGNOSIS — Z7982 Long term (current) use of aspirin: Secondary | ICD-10-CM | POA: Diagnosis not present

## 2017-11-13 DIAGNOSIS — Z955 Presence of coronary angioplasty implant and graft: Secondary | ICD-10-CM | POA: Diagnosis not present

## 2017-11-13 DIAGNOSIS — I4892 Unspecified atrial flutter: Secondary | ICD-10-CM | POA: Diagnosis present

## 2017-11-13 DIAGNOSIS — I2510 Atherosclerotic heart disease of native coronary artery without angina pectoris: Secondary | ICD-10-CM | POA: Diagnosis present

## 2017-11-13 DIAGNOSIS — R000 Tachycardia, unspecified: Secondary | ICD-10-CM | POA: Diagnosis present

## 2017-11-13 LAB — CBC
HCT: 41 % (ref 40.0–52.0)
HEMATOCRIT: 38.4 % — AB (ref 40.0–52.0)
Hemoglobin: 13 g/dL (ref 13.0–18.0)
Hemoglobin: 13.8 g/dL (ref 13.0–18.0)
MCH: 31.3 pg (ref 26.0–34.0)
MCH: 31.4 pg (ref 26.0–34.0)
MCHC: 33.8 g/dL (ref 32.0–36.0)
MCHC: 33.6 g/dL (ref 32.0–36.0)
MCV: 92.7 fL (ref 80.0–100.0)
MCV: 93.4 fL (ref 80.0–100.0)
PLATELETS: 285 10*3/uL (ref 150–440)
PLATELETS: 299 10*3/uL (ref 150–440)
RBC: 4.14 MIL/uL — ABNORMAL LOW (ref 4.40–5.90)
RBC: 4.39 MIL/uL — AB (ref 4.40–5.90)
RDW: 12.6 % (ref 11.5–14.5)
RDW: 12.6 % (ref 11.5–14.5)
WBC: 10 10*3/uL (ref 3.8–10.6)
WBC: 11.3 10*3/uL — ABNORMAL HIGH (ref 3.8–10.6)

## 2017-11-13 LAB — COMPREHENSIVE METABOLIC PANEL
ALT: 18 U/L (ref 17–63)
ANION GAP: 7 (ref 5–15)
AST: 16 U/L (ref 15–41)
Albumin: 3.2 g/dL — ABNORMAL LOW (ref 3.5–5.0)
Alkaline Phosphatase: 65 U/L (ref 38–126)
BILIRUBIN TOTAL: 0.7 mg/dL (ref 0.3–1.2)
BUN: 22 mg/dL — AB (ref 6–20)
CHLORIDE: 106 mmol/L (ref 101–111)
CO2: 25 mmol/L (ref 22–32)
Calcium: 8.1 mg/dL — ABNORMAL LOW (ref 8.9–10.3)
Creatinine, Ser: 0.94 mg/dL (ref 0.61–1.24)
GFR calc Af Amer: >60 mL/min (ref >60)
GFR calc non Af Amer: >60 mL/min (ref >60)
GLUCOSE: 111 mg/dL — AB (ref 65–99)
POTASSIUM: 3.9 mmol/L (ref 3.5–5.1)
SODIUM: 138 mmol/L (ref 135–145)
TOTAL PROTEIN: 6 g/dL — AB (ref 6.5–8.1)

## 2017-11-13 LAB — PROTIME-INR
INR: 1.01
PROTHROMBIN TIME: 13.2 s (ref 11.4–15.2)

## 2017-11-13 LAB — BASIC METABOLIC PANEL
Anion gap: 6 (ref 5–15)
BUN: 21 mg/dL — AB (ref 6–20)
CALCIUM: 8.3 mg/dL — AB (ref 8.9–10.3)
CO2: 26 mmol/L (ref 22–32)
CREATININE: 0.97 mg/dL (ref 0.61–1.24)
Chloride: 106 mmol/L (ref 101–111)
GFR calc Af Amer: >60 mL/min (ref >60)
Glucose, Bld: 135 mg/dL — ABNORMAL HIGH (ref 65–99)
POTASSIUM: 4 mmol/L (ref 3.5–5.1)
SODIUM: 138 mmol/L (ref 135–145)

## 2017-11-13 LAB — TROPONIN I: TROPONIN I: 0.03 ng/mL — AB (ref <0.03)

## 2017-11-13 LAB — GLUCOSE, CAPILLARY: Glucose-Capillary: 122 mg/dL — ABNORMAL HIGH (ref 65–99)

## 2017-11-13 MED ORDER — SODIUM CHLORIDE 0.9% FLUSH: 3.0000 mL | INTRAVENOUS | Status: DC | PRN

## 2017-11-13 MED ORDER — SODIUM CHLORIDE 0.9 % WEIGHT BASED INFUSION: 3.0000 mL/kg/h | INTRAVENOUS | Status: AC

## 2017-11-13 MED ORDER — SODIUM CHLORIDE 0.9 % WEIGHT BASED INFUSION
3.0000 mL/kg/h | INTRAVENOUS | Status: AC
  Administered 2017-11-14: 3 mL/kg/h via INTRAVENOUS

## 2017-11-13 MED ORDER — SODIUM CHLORIDE 0.9 % WEIGHT BASED INFUSION: 1.0000 mL/kg/h | INTRAVENOUS | Status: DC

## 2017-11-13 MED ORDER — SODIUM CHLORIDE 0.9 % IV SOLN: 250.0000 mL | INTRAVENOUS | Status: DC | PRN

## 2017-11-13 MED ORDER — SODIUM CHLORIDE 0.9% FLUSH
3.0000 mL | Freq: Two times a day (BID) | INTRAVENOUS | Status: DC
  Administered 2017-11-13 (×2): 3 mL via INTRAVENOUS

## 2017-11-13 MED ORDER — ACETAMINOPHEN 650 MG RE SUPP: 650.0000 mg | Freq: Four times a day (QID) | RECTAL | Status: DC | PRN

## 2017-11-13 MED ORDER — TEMAZEPAM 7.5 MG PO CAPS
7.5000 mg | ORAL_CAPSULE | Freq: Every evening | ORAL | Status: DC | PRN
  Administered 2017-11-13: 7.5 mg via ORAL
  Filled 2017-11-13: qty 1

## 2017-11-13 MED ORDER — ACETAMINOPHEN 325 MG PO TABS: 650.0000 mg | ORAL_TABLET | Freq: Four times a day (QID) | ORAL | Status: DC | PRN

## 2017-11-13 MED ORDER — ASPIRIN 81 MG PO CHEW
81.0000 mg | CHEWABLE_TABLET | ORAL | Status: AC
  Filled 2017-11-13: qty 1

## 2017-11-13 MED ORDER — LEVOTHYROXINE SODIUM 50 MCG PO TABS
50.0000 ug | ORAL_TABLET | ORAL | Status: DC
  Administered 2017-11-14: 50 ug via ORAL
  Filled 2017-11-13: qty 1

## 2017-11-13 MED ORDER — SODIUM CHLORIDE 0.9% FLUSH
3.0000 mL | Freq: Two times a day (BID) | INTRAVENOUS | Status: DC
  Administered 2017-11-13: 3 mL via INTRAVENOUS

## 2017-11-13 MED ORDER — ASPIRIN 81 MG PO CHEW
81.0000 mg | CHEWABLE_TABLET | ORAL | Status: AC
  Administered 2017-11-14: 81 mg via ORAL

## 2017-11-13 NOTE — Plan of Care (Signed)
  Clinical Measurements: Ability to maintain clinical measurements within normal limits will improve 11/13/2017 1804 - Not Progressing by Daron Offer, RN Note BUN today is elevated at 22. Will continue to monitor. Wenda Low [facility]

## 2017-11-13 NOTE — Consult Note (Signed)
Nathaniel Ibarra is a 76 y.o. male  [id number]  Primary Cardiologist: Neoma Laming Reason for Consultation: Atrial fibrillation  HPI: 76 year old white male with a past medical history of PCI and stenting of the mid LAD after non-STEMI in 2016 presented with atrial fibrillation weakness tiredness and fatigue and occasional chest pain.   Review of Systems: No orthopnea PND or leg swelling   Past Medical History:  Diagnosis Date  . Bladder cancer ([place])   . BPH (benign prostatic hyperplasia)   . GERD (gastroesophageal reflux disease)   . Hypertension   . Myocardial infarction [facility])    cath with stent in 2016  . Thyroid disease     Medications Prior to Admission  Medication Sig Dispense Refill  . amiodarone (PACERONE) 200 MG tablet Take 2 tablets (400 mg total) by mouth daily. 60 tablet 0  . amLODipine (NORVASC) 5 MG tablet Take 5 mg by mouth every Monday, Wednesday, and Friday.   5  . aspirin EC 325 MG EC tablet Take 1 tablet (325 mg total) by mouth daily. (Patient taking differently: Take 81 mg by mouth daily. ) 30 tablet 0  . buPROPion (WELLBUTRIN XL) 150 MG 24 hr tablet Take 150 mg by mouth daily.    . finasteride (PROSCAR) 5 MG tablet Take 5 mg by mouth daily.  0  . levothyroxine (SYNTHROID, LEVOTHROID) 50 MCG tablet Take 50 mcg by mouth every morning.  3  . pantoprazole (PROTONIX) 40 MG tablet Take 40 mg by mouth daily as needed.     . tamsulosin (FLOMAX) 0.4 MG CAPS capsule Take 0.4 mg by mouth 2 (two) times daily.   3  . clopidogrel (PLAVIX) 75 MG tablet Take 1 tablet (75 mg total) by mouth once. (Patient not taking: Reported on 11/11/2017) 30 tablet 5  . ibuprofen (MOTRIN IB) 200 MG tablet Take 1 tablet (200 mg total) by mouth every 6 (six) hours as needed. (Patient not taking: Reported on 11/11/2017) 30 tablet 0     . aspirin  81 mg Oral Daily  . buPROPion  150 mg Oral Daily  . docusate sodium  100 mg Oral BID  . enoxaparin (LOVENOX) injection  40 mg Subcutaneous  Q24H  . finasteride  5 mg Oral Daily  . furosemide  40 mg Intravenous Q12H  . levothyroxine  50 mcg Oral BH-q7a  . nitroGLYCERIN  0.5 inch Topical Q6H  . pantoprazole  40 mg Oral BID  . potassium chloride  20 mEq Oral BID  . tamsulosin  0.4 mg Oral BID    Infusions: . sodium chloride 75 mL/hr at 11/12/17 1925  . amiodarone 30 mg/hr (11/12/17 2336)    No Known Allergies  Social History   Socioeconomic History  . Marital status: Married    Spouse name: Not on file  . Number of children: Not on file  . Years of education: Not on file  . Highest education level: Not on file  Social Needs  . Financial resource strain: Not on file  . Food insecurity - worry: Not on file  . Food insecurity - inability: Not on file  . Transportation needs - medical: Not on file  . Transportation needs - non-medical: Not on file  Occupational History  . Not on file  Tobacco Use  . Smoking status: Never Smoker  . Smokeless tobacco: Never Used  Substance and Sexual Activity  . Alcohol use: No  . Drug use: No  . Sexual activity: Not Currently  Other  Topics Concern  . Not on file  Social History Narrative  . Not on file    Family History  Problem Relation Age of Onset  . Emphysema Mother   . Emphysema Father     PHYSICAL EXAM: Vitals:   11/13/17 0356 11/13/17 0809  BP:  (!) 153/74  Pulse: 63 (!) 44  Resp:  19  Temp:  97.8 F (36.6 C)  SpO2: 91% 93%     Intake/Output Summary (Last 24 hours) at 11/13/2017 0842 Last data filed at 11/13/2017 7616 Gross per 24 hour  Intake 2168.66 ml  Output 1475 ml  Net 693.66 ml    General:  Well appearing. No respiratory difficulty HEENT: normal Neck: supple. no JVD. Carotids 2+ bilat; no bruits. No lymphadenopathy or thryomegaly appreciated. Cor: PMI nondisplaced. Regular rate & rhythm. No rubs, gallops or murmurs. Lungs: clear Abdomen: soft, nontender, nondistended. No hepatosplenomegaly. No bruits or masses. Good bowel  sounds. Extremities: no cyanosis, clubbing, rash, edema Neuro: alert & oriented x 3, cranial nerves grossly intact. moves all 4 extremities w/o difficulty. Affect pleasant.  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  Results for orders placed or performed during the hospital encounter of 11/12/17 (from the past 24 hour(s))  Basic metabolic panel     Status: Abnormal   Collection Time: 11/12/17  3:24 PM  Result Value Ref Range   Sodium 138 135 - 145 mmol/L   Potassium 3.9 3.5 - 5.1 mmol/L   Chloride 106 101 - 111 mmol/L   CO2 24 22 - 32 mmol/L   Glucose, Bld 130 (H) 65 - 99 mg/dL   BUN 22 (H) 6 - 20 mg/dL   Creatinine, Ser 0.89 0.61 - 1.24 mg/dL   Calcium 8.2 (L) 8.9 - 10.3 mg/dL   GFR calc non Af Amer >60 >60 mL/min   GFR calc Af Amer >60 >60 mL/min   Anion gap 8 5 - 15  CBC     Status: Abnormal   Collection Time: 11/12/17  3:24 PM  Result Value Ref Range   WBC 10.0 3.8 - 10.6 K/uL   RBC 4.26 (L) 4.40 - 5.90 MIL/uL   Hemoglobin 13.7 13.0 - 18.0 g/dL   HCT 39.8 (L) 40.0 - 52.0 %   MCV 93.3 80.0 - 100.0 fL   MCH 32.1 26.0 - 34.0 pg   MCHC 34.4 32.0 - 36.0 g/dL   RDW 12.8 11.5 - 14.5 %   Platelets 293 150 - 440 K/uL  Troponin I     Status: Abnormal   Collection Time: 11/12/17  3:24 PM  Result Value Ref Range   Troponin I 0.04 (HH) <0.03 ng/mL  TSH     Status: Abnormal   Collection Time: 11/12/17  7:10 PM  Result Value Ref Range   TSH 5.604 (H) 0.350 - 4.500 uIU/mL  Troponin I     Status: Abnormal   Collection Time: 11/12/17  7:10 PM  Result Value Ref Range   Troponin I 0.03 (HH) <0.03 ng/mL  Troponin I     Status: Abnormal   Collection Time: 11/13/17 12:39 AM  Result Value Ref Range   Troponin I 0.03 (HH) <0.03 ng/mL  Troponin I     Status: None   Collection Time: 11/13/17  6:45 AM  Result Value Ref Range   Troponin I <0.03 <0.03 ng/mL  CBC     Status: Abnormal   Collection Time: 11/13/17  6:45 AM  Result Value Ref Range   WBC 10.0  3.8 - 10.6 K/uL    RBC 4.14 (L) 4.40 - 5.90 MIL/uL   Hemoglobin 13.0 13.0 - 18.0 g/dL   HCT 38.4 (L) 40.0 - 52.0 %   MCV 92.7 80.0 - 100.0 fL   MCH 31.3 26.0 - 34.0 pg   MCHC 33.8 32.0 - 36.0 g/dL   RDW 12.6 11.5 - 14.5 %   Platelets 285 150 - 440 K/uL  Comprehensive metabolic panel     Status: Abnormal   Collection Time: 11/13/17  6:45 AM  Result Value Ref Range   Sodium 138 135 - 145 mmol/L   Potassium 3.9 3.5 - 5.1 mmol/L   Chloride 106 101 - 111 mmol/L   CO2 25 22 - 32 mmol/L   Glucose, Bld 111 (H) 65 - 99 mg/dL   BUN 22 (H) 6 - 20 mg/dL   Creatinine, Ser 0.94 0.61 - 1.24 mg/dL   Calcium 8.1 (L) 8.9 - 10.3 mg/dL   Total Protein 6.0 (L) 6.5 - 8.1 g/dL   Albumin 3.2 (L) 3.5 - 5.0 g/dL   AST 16 15 - 41 U/L   ALT 18 17 - 63 U/L   Alkaline Phosphatase 65 38 - 126 U/L   Total Bilirubin 0.7 0.3 - 1.2 mg/dL   GFR calc non Af Amer >60 >60 mL/min   GFR calc Af Amer >60 >60 mL/min   Anion gap 7 5 - 15  Glucose, capillary     Status: Abnormal   Collection Time: 11/13/17  8:10 AM  Result Value Ref Range   Glucose-Capillary 122 (H) 65 - 99 mg/dL   X-ray Chest Pa And Lateral  Result Date: 11/13/2017 CLINICAL DATA:  76 year old male with congestive heart failure EXAM: CHEST  2 VIEW COMPARISON:  Prior chest x-ray 11/11/2017 FINDINGS: Stable cardiomegaly. Pulmonary vascular congestion with diffuse interstitial prominence and a small amount of fluid tracking along the minor fissure. Kerley B-lines are present in the periphery of the lungs. Findings are most consistent with mild interstitial pulmonary edema. Small bilateral pleural effusions are present with associated atelectasis. No pneumothorax. No acute osseous abnormality. IMPRESSION: Mild to moderate congestive heart failure. Small bilateral pleural effusions. Electronically Signed   By: Jacqulynn Cadet M.D.   On: 11/13/2017 07:46   Dg Chest Port 1 View  Result Date: 11/11/2017 CLINICAL DATA:  Sinus and chest discomfort for scar. Central chest pain.  EXAM: PORTABLE CHEST 1 VIEW COMPARISON:  08/16/2015 FINDINGS: The heart size and mediastinal contours are within normal limits. Minimal aortic atherosclerosis at the arch without aneurysm. Mild interstitial edema. No significant pleural effusion. The visualized skeletal structures are unremarkable. IMPRESSION: 1. Mild interstitial edema. 2. Aortic atherosclerosis. Electronically Signed   By: Ashley Royalty M.D.   On: 11/11/2017 17:54     ASSESSMENT AND PLAN: Atrial fibrillation with bundle branch block and history of coronary artery disease status post PCI and stenting of the mid LAD with onset of the atrial fibrillation being new. Patient also has symptoms suggestive of acute coronary syndrome and was proceed with catheterization.  KHAN,SHAUKAT A

## 2017-11-13 NOTE — Care Management Obs Status (Signed)
[place] NOTIFICATION   Patient Details  Name: Nathaniel Ibarra MRN: [id number] Date of Birth: [birth year]   Medicare Observation Status Notification Given:  Yes Notice signed, one given to patient and the other to HIM for scanning    Katrina Stack, RN 11/13/2017, 9:54 AM

## 2017-11-13 NOTE — Care Management (Signed)
Patient seen in ED 11/24 and found to be in atrial fib requiring IVP adenosine.  Patient returned to the ED 11/26 and again found to  be in rapid atrial fib and placed on amiodarone drip.  Patient's wife asked CM to check with cardiology office (Dr Humphrey Rolls) to see if they now accept [employer] and informed office is now in network.  Wife addresses concern that patient failed outpatient attempt  to resolve cardiac sx and now in observation rather than admitted.  Reached out to UR for close follow up of this observation case.

## 2017-11-13 NOTE — Progress Notes (Signed)
[place] at [place] NAME: Nathaniel Ibarra    MR#:  [id number]  DATE OF BIRTH:  1941-07-04  SUBJECTIVE:  CHIEF COMPLAINT:   Chief Complaint  Patient presents with  . Irregular Heart Beat  seems frustrated (mainly family - wife & son) as was expecting cath today REVIEW OF SYSTEMS:  Review of Systems  Constitutional: Positive for malaise/fatigue. Negative for diaphoresis, fever and weight loss.  HENT: Negative for ear discharge, ear pain, hearing loss, nosebleeds, sore throat and tinnitus.   Eyes: Negative for blurred vision and pain.  Respiratory: Positive for shortness of breath. Negative for cough, hemoptysis and wheezing.   Cardiovascular: Positive for chest pain and palpitations. Negative for orthopnea and leg swelling.  Gastrointestinal: Negative for abdominal pain, blood in stool, constipation, diarrhea, heartburn, nausea and vomiting.  Genitourinary: Negative for dysuria, frequency and urgency.  Musculoskeletal: Negative for back pain and myalgias.  Skin: Negative for itching and rash.  Neurological: Positive for weakness. Negative for dizziness, tingling, tremors, focal weakness, seizures and headaches.  Psychiatric/Behavioral: Negative for depression. The patient is not nervous/anxious.     DRUG ALLERGIES:  No Known Allergies VITALS:  Blood pressure (!) 153/74, pulse (!) 44, temperature 97.8 F (36.6 C), temperature source Oral, resp. rate 19, height 6' (1.829 m), weight 92.4 kg (203 lb 9.6 oz), SpO2 93 %. PHYSICAL EXAMINATION:  Physical Exam  Constitutional: He is oriented to person, place, and time and well-developed, well-nourished, and in no distress.  HENT:  Head: Normocephalic and atraumatic.  Eyes: Conjunctivae and EOM are normal. Pupils are equal, round, and reactive to light.  Neck: Normal range of motion. Neck supple. No tracheal deviation present. No thyromegaly present.  Cardiovascular: Normal rate, regular  rhythm and normal heart sounds.  Pulmonary/Chest: Effort normal and breath sounds normal. No respiratory distress. He has no wheezes. He exhibits no tenderness.  Abdominal: Soft. Bowel sounds are normal. He exhibits no distension. There is no tenderness.  Musculoskeletal: Normal range of motion.  Neurological: He is alert and oriented to person, place, and time. No cranial nerve deficit.  Skin: Skin is warm and dry. No rash noted.  Psychiatric: Mood and affect normal.   LABORATORY PANEL:  Male CBC Recent Labs  Lab 11/13/17 0850  WBC 11.3*  HGB 13.8  HCT 41.0  PLT 299   ------------------------------------------------------------------------------------------------------------------ Chemistries  Recent Labs  Lab 11/13/17 0645 11/13/17 0850  NA 138 138  K 3.9 4.0  CL 106 106  CO2 25 26  GLUCOSE 111* 135*  BUN 22* 21*  CREATININE 0.94 0.97  CALCIUM 8.1* 8.3*  AST 16  --   ALT 18  --   ALKPHOS 65  --   BILITOT 0.7  --    RADIOLOGY:  X-ray Chest Pa And Lateral  Result Date: 11/13/2017 CLINICAL DATA:  76 year old male with congestive heart failure EXAM: CHEST  2 VIEW COMPARISON:  Prior chest x-ray 11/11/2017 FINDINGS: Stable cardiomegaly. Pulmonary vascular congestion with diffuse interstitial prominence and a small amount of fluid tracking along the minor fissure. Kerley B-lines are present in the periphery of the lungs. Findings are most consistent with mild interstitial pulmonary edema. Small bilateral pleural effusions are present with associated atelectasis. No pneumothorax. No acute osseous abnormality. IMPRESSION: Mild to moderate congestive heart failure. Small bilateral pleural effusions. Electronically Signed   By: Jacqulynn Cadet M.D.   On: 11/13/2017 07:46   ASSESSMENT AND PLAN:  76 y.o. male has a past medical history  significant for CAD, HTN, thyroid disease now with weakness and SOB with some chest discomfort. Found to be in rapid A-fib in ER now stable on  Amiodarone drip  * Atrial fibrillation with RVR:  - Continue Amio drip - Cardio c/s - await cardio input for anticoagulation  * Chest pain/ACS - Cardio Dr Humphrey Rolls planning Cath tomorrow at 7:30 am - history of PCI and stenting of the mid LAD after non-STEMI in 2016   * CHF: Acute on chronic Diastolic - last echo showed EF 55%  * Hypothyroidism - TSH 5.6, recheck in am - if still high, will increase synthroid  * Elevated troponin - due to supply demand ischemia      All the records are reviewed and case discussed with Care Management/Social Worker. Management plans discussed with the patient, family and they are in agreement.  CODE STATUS: Full Code  TOTAL TIME TAKING CARE OF THIS PATIENT: 25 minutes.   More than 50% of the time was spent in counseling/coordination of care: YES  POSSIBLE D/C IN 2-3 DAYS, DEPENDING ON CLINICAL CONDITION.   Max Sane M.D on 11/13/2017 at 3:11 PM  Between 7am to 6pm - Pager - [phone]  After 6pm go to [email or web address] - Proofreader  [facility] [place] [facility]  Office  [phone]  CC: Primary care physician; Albina Billet, MD  Note: This dictation was prepared with Dragon dictation along with smaller phrase technology. Any transcriptional errors that result from this process are unintentional.

## 2017-11-14 ENCOUNTER — Encounter
Admission: EM | Disposition: A | Discharge status: Home / Self Care | Payer: Self-pay | Source: Home / Self Care | Attending: Internal Medicine

## 2017-11-14 DIAGNOSIS — I4891 Unspecified atrial fibrillation: Principal | ICD-10-CM

## 2017-11-14 DIAGNOSIS — I5021 Acute systolic (congestive) heart failure: Secondary | ICD-10-CM

## 2017-11-14 DIAGNOSIS — I501 Left ventricular failure: Secondary | ICD-10-CM

## 2017-11-14 HISTORY — PX: LEFT HEART CATH AND CORONARY ANGIOGRAPHY: CATH118249

## 2017-11-14 LAB — CBC
HCT: 39.5 % — ABNORMAL LOW (ref 40.0–52.0)
Hemoglobin: 13.7 g/dL (ref 13.0–18.0)
MCH: 32.4 pg (ref 26.0–34.0)
MCHC: 34.7 g/dL (ref 32.0–36.0)
MCV: 93.5 fL (ref 80.0–100.0)
Platelets: 297 10*3/uL (ref 150–440)
RBC: 4.23 MIL/uL — ABNORMAL LOW (ref 4.40–5.90)
RDW: 12.6 % (ref 11.5–14.5)
WBC: 9.6 10*3/uL (ref 3.8–10.6)

## 2017-11-14 LAB — TSH: TSH: 5.874 u[IU]/mL — ABNORMAL HIGH (ref 0.350–4.500)

## 2017-11-14 LAB — BASIC METABOLIC PANEL
ANION GAP: 8 (ref 5–15)
BUN: 21 mg/dL — ABNORMAL HIGH (ref 6–20)
CALCIUM: 8.3 mg/dL — AB (ref 8.9–10.3)
CHLORIDE: 104 mmol/L (ref 101–111)
CO2: 27 mmol/L (ref 22–32)
Creatinine, Ser: 0.89 mg/dL (ref 0.61–1.24)
GFR calc non Af Amer: >60 mL/min (ref >60)
GLUCOSE: 117 mg/dL — AB (ref 65–99)
POTASSIUM: 4 mmol/L (ref 3.5–5.1)
Sodium: 139 mmol/L (ref 135–145)

## 2017-11-14 LAB — MRSA PCR SCREENING: MRSA by PCR: NEGATIVE

## 2017-11-14 LAB — GLUCOSE, CAPILLARY: GLUCOSE-CAPILLARY: 92 mg/dL (ref 65–99)

## 2017-11-14 SURGERY — LEFT HEART CATH AND CORONARY ANGIOGRAPHY
Anesthesia: Moderate Sedation | Laterality: Right

## 2017-11-14 MED ORDER — APIXABAN 5 MG PO TABS
5.0000 mg | ORAL_TABLET | Freq: Two times a day (BID) | ORAL | Status: DC
  Administered 2017-11-15 – 2017-11-17 (×5): 5 mg via ORAL
  Filled 2017-11-14 (×5): qty 1

## 2017-11-14 MED ORDER — SODIUM CHLORIDE 0.9% FLUSH: 3.0000 mL | INTRAVENOUS | Status: DC | PRN

## 2017-11-14 MED ORDER — LEVOTHYROXINE SODIUM 50 MCG PO TABS
75.0000 ug | ORAL_TABLET | ORAL | Status: DC
  Administered 2017-11-15 – 2017-11-17 (×3): 75 ug via ORAL
  Filled 2017-11-14: qty 1
  Filled 2017-11-14: qty 2
  Filled 2017-11-14 (×2): qty 1

## 2017-11-14 MED ORDER — DIGOXIN 0.25 MG/ML IJ SOLN
0.2500 mg | Freq: Once | INTRAMUSCULAR | Status: AC
  Administered 2017-11-14: 0.25 mg via INTRAVENOUS
  Filled 2017-11-14: qty 2

## 2017-11-14 MED ORDER — FENTANYL CITRATE (PF) 100 MCG/2ML IJ SOLN
INTRAMUSCULAR | Status: AC
  Filled 2017-11-14: qty 2

## 2017-11-14 MED ORDER — MIDAZOLAM HCL 2 MG/2ML IJ SOLN
INTRAMUSCULAR | Status: DC | PRN
  Administered 2017-11-14: 0.5 mg via INTRAVENOUS

## 2017-11-14 MED ORDER — ONDANSETRON HCL 4 MG/2ML IJ SOLN: 4.0000 mg | Freq: Four times a day (QID) | INTRAMUSCULAR | Status: DC | PRN

## 2017-11-14 MED ORDER — FUROSEMIDE 10 MG/ML IJ SOLN
INTRAMUSCULAR | Status: AC
  Filled 2017-11-14: qty 4

## 2017-11-14 MED ORDER — SODIUM CHLORIDE 0.9 % IV SOLN: 250.0000 mL | INTRAVENOUS | Status: DC | PRN

## 2017-11-14 MED ORDER — SODIUM CHLORIDE 0.9 % WEIGHT BASED INFUSION: 1.0000 mL/kg/h | INTRAVENOUS | Status: AC

## 2017-11-14 MED ORDER — FENTANYL CITRATE (PF) 100 MCG/2ML IJ SOLN
INTRAMUSCULAR | Status: DC | PRN
  Administered 2017-11-14: 25 ug via INTRAVENOUS

## 2017-11-14 MED ORDER — DIGOXIN 0.25 MG/ML IJ SOLN
INTRAMUSCULAR | Status: AC
  Filled 2017-11-14: qty 2

## 2017-11-14 MED ORDER — IOPAMIDOL (ISOVUE-300) INJECTION 61%
INTRAVENOUS | Status: DC | PRN
  Administered 2017-11-14: 95 mL via INTRA_ARTERIAL

## 2017-11-14 MED ORDER — MIDAZOLAM HCL 2 MG/2ML IJ SOLN
INTRAMUSCULAR | Status: AC
  Filled 2017-11-14: qty 2

## 2017-11-14 MED ORDER — LIDOCAINE HCL (PF) 1 % IJ SOLN
INTRAMUSCULAR | Status: AC
  Filled 2017-11-14: qty 30

## 2017-11-14 MED ORDER — SODIUM CHLORIDE 0.9% FLUSH
3.0000 mL | Freq: Two times a day (BID) | INTRAVENOUS | Status: DC
  Administered 2017-11-14 – 2017-11-17 (×6): 3 mL via INTRAVENOUS

## 2017-11-14 MED ORDER — ACETAMINOPHEN 325 MG PO TABS: 650.0000 mg | ORAL_TABLET | ORAL | Status: DC | PRN

## 2017-11-14 SURGICAL SUPPLY — 10 items
CATH 5FR JL4 DIAGNOSTIC (CATHETERS) ×2 IMPLANT
CATH INFINITI 5FR ANG PIGTAIL (CATHETERS) ×2 IMPLANT
CATH INFINITI JR4 5F (CATHETERS) ×2 IMPLANT
DEVICE CLOSURE MYNXGRIP 5F (Vascular Products) ×2 IMPLANT
KIT MANI 3VAL PERCEP (MISCELLANEOUS) ×3 IMPLANT
NDL PERC 18GX7CM (NEEDLE) IMPLANT
NEEDLE PERC 18GX7CM (NEEDLE) ×3 IMPLANT
PACK CARDIAC CATH (CUSTOM PROCEDURE TRAY) ×3 IMPLANT
SHEATH PINNACLE 5F 10CM (SHEATH) ×2 IMPLANT
WIRE EMERALD 3MM-J .035X150CM (WIRE) ×2 IMPLANT

## 2017-11-14 NOTE — Progress Notes (Addendum)
[facility] [place] NAME: Nathaniel Ibarra    MR#:  [id number]  DATE OF BIRTH:  1941-10-07  SUBJECTIVE:  CHIEF COMPLAINT:   Chief Complaint  Patient presents with  . Irregular Heart Beat  Status post cath went into rapid A. fib and hypoxia, requiring transfer to ICU REVIEW OF SYSTEMS:  Review of Systems  Constitutional: Positive for malaise/fatigue. Negative for diaphoresis, fever and weight loss.  HENT: Negative for ear discharge, ear pain, hearing loss, nosebleeds, sore throat and tinnitus.   Eyes: Negative for blurred vision and pain.  Respiratory: Positive for shortness of breath. Negative for cough, hemoptysis and wheezing.   Cardiovascular: Positive for chest pain and palpitations. Negative for orthopnea and leg swelling.  Gastrointestinal: Negative for abdominal pain, blood in stool, constipation, diarrhea, heartburn, nausea and vomiting.  Genitourinary: Negative for dysuria, frequency and urgency.  Musculoskeletal: Negative for back pain and myalgias.  Skin: Negative for itching and rash.  Neurological: Positive for weakness. Negative for dizziness, tingling, tremors, focal weakness, seizures and headaches.  Psychiatric/Behavioral: Negative for depression. The patient is not nervous/anxious.    DRUG ALLERGIES:  No Known Allergies VITALS:  Blood pressure (!) 178/78, pulse (!) 57, temperature 98.6 F (37 C), temperature source Oral, resp. rate (!) 25, height 6' (1.829 m), weight 90.3 kg (199 lb 1.2 oz), SpO2 91 %. PHYSICAL EXAMINATION:  Physical Exam  Constitutional: He is oriented to person, place, and time and well-developed, well-nourished, and in no distress.  HENT:  Head: Normocephalic and atraumatic.  Eyes: Conjunctivae and EOM are normal. Pupils are equal, round, and reactive to light.  Neck: Normal range of motion. Neck supple. No tracheal deviation present. No thyromegaly present.  Cardiovascular: Normal rate, regular  rhythm and normal heart sounds.  Pulmonary/Chest: Effort normal and breath sounds normal. No respiratory distress. He has no wheezes. He exhibits no tenderness.  Abdominal: Soft. Bowel sounds are normal. He exhibits no distension. There is no tenderness.  Musculoskeletal: Normal range of motion.  Neurological: He is alert and oriented to person, place, and time. No cranial nerve deficit.  Skin: Skin is warm and dry. No rash noted.  Psychiatric: Mood and affect normal.   LABORATORY PANEL:  Male CBC Recent Labs  Lab 11/14/17 0440  WBC 9.6  HGB 13.7  HCT 39.5*  PLT 297   ------------------------------------------------------------------------------------------------------------------ Chemistries  Recent Labs  Lab 11/13/17 0645  11/14/17 0440  NA 138   < > 139  K 3.9   < > 4.0  CL 106   < > 104  CO2 25   < > 27  GLUCOSE 111*   < > 117*  BUN 22*   < > 21*  CREATININE 0.94   < > 0.89  CALCIUM 8.1*   < > 8.3*  AST 16  --   --   ALT 18  --   --   ALKPHOS 65  --   --   BILITOT 0.7  --   --    < > = values in this interval not displayed.   RADIOLOGY:  No results found. ASSESSMENT AND PLAN:  76 y.o. male has a past medical history significant for CAD, HTN, thyroid disease now with weakness and SOB with some chest discomfort. 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 Management plans discussed with the patient, family and they are in agreement.  CODE STATUS: Full Code  TOTAL TIME TAKING CARE OF THIS PATIENT: 25 minutes.   More than 50% of the time was spent in counseling/coordination of care: YES  POSSIBLE D/C IN 1-2 DAYS, DEPENDING ON CLINICAL CONDITION.   Max Sane M.D on 11/14/2017 at 5:10 PM  Between 7am to 6pm - Pager - [phone]  After 6pm go to [email or web address] - Proofreader  [facility]  Office  [phone]  CC: Primary care physician; Albina Billet, MD  Note: This dictation was prepared with Dragon dictation along with smaller phrase technology. Any transcriptional errors that result from this process are unintentional.

## 2017-11-14 NOTE — Progress Notes (Signed)
ANTICOAGULATION CONSULT NOTE - Initial Consult  Pharmacy Consult for Apixaban Indication: nonvalvular atrial fibrillation  No Known Allergies  Patient Measurements: Height: 6' (182.9 cm) Weight: 199 lb 1.2 oz (90.3 kg) IBW/kg (Calculated) : 77.6   Vital Signs: Temp: 98.6 F (37 C) (11/27 1600) Temp Source: Oral (11/27 1600) BP: 178/78 (11/27 1700) Pulse Rate: 57 (11/27 1700)  Labs: Recent Labs    11/12/17 1910 11/13/17 0039 11/13/17 0645 11/13/17 0850 11/14/17 0440  HGB  --   --  13.0 13.8 13.7  HCT  --   --  38.4* 41.0 39.5*  PLT  --   --  285 299 297  LABPROT  --   --   --  13.2  --   INR  --   --   --  1.01  --   CREATININE  --   --  0.94 0.97 0.89  TROPONINI 0.03* 0.03* <0.03  --   --     Estimated Creatinine Clearance: 78.7 mL/min (by C-G formula based on SCr of 0.89 mg/dL).   Medical History: Past Medical History:  Diagnosis Date  . Bladder cancer ([place])   . BPH (benign prostatic hyperplasia)   . GERD (gastroesophageal reflux disease)   . Hypertension   . Myocardial infarction [facility])    cath with stent in 2016  . Thyroid disease     Medications:  Last lovenox dose of 40mg  on 11/26 @ 2133.   Assessment: 76 yo male admitted with uncontrolled a fib with RVR and status post cath. Pharmacy has been consulted for apixaban dosing.   Goal of Therapy:  Monitor platelets by anticoagulation protocol: Yes   Plan:  Will start apixaban 5mg  BID.   Lendon Ka, PharmD Pharmacy Resident 11/14/2017,5:36 PM

## 2017-11-14 NOTE — OR Nursing (Signed)
Received for cath lab o2 at 5 liters via [place], sats 86-88% placed on NRB 100% heart rate 134 . Dr Humphrey Rolls paged and aware of above, 40 lasix given iv and Dig .25 given as ordered RT into see taken off NRB and placed on bipap at 50 % FI02...Marland Kitchenheart rate continues to bounce between A flutter and a ventricular rate of 130"s

## 2017-11-14 NOTE — Progress Notes (Signed)
SUBJECTIVE: Patient in repiratory distress with apnoeas   Vitals:   11/14/17 0905 11/14/17 0910 11/14/17 0915 11/14/17 0920  BP:   129/74   Pulse: 74 69 69 70  Resp: (!) 21 (!) 26 (!) 24 16  Temp:      TempSrc:      SpO2: 93% 93% 95% 99%  Weight:      Height:        Intake/Output Summary (Last 24 hours) at 11/14/2017 0934 Last data filed at 11/14/2017 0900 Gross per 24 hour  Intake 240 ml  Output 3700 ml  Net -3460 ml    LABS: Basic Metabolic Panel: Recent Labs    11/13/17 0850 11/14/17 0440  NA 138 139  K 4.0 4.0  CL 106 104  CO2 26 27  GLUCOSE 135* 117*  BUN 21* 21*  CREATININE 0.97 0.89  CALCIUM 8.3* 8.3*   Liver Function Tests: Recent Labs    11/11/17 1657 11/13/17 0645  AST 17 16  ALT 18 18  ALKPHOS 80 65  BILITOT 0.8 0.7  PROT 6.4* 6.0*  ALBUMIN 3.6 3.2*   No results for input(s): LIPASE, AMYLASE in the last 72 hours. CBC: Recent Labs    11/13/17 0850 11/14/17 0440  WBC 11.3* 9.6  HGB 13.8 13.7  HCT 41.0 39.5*  MCV 93.4 93.5  PLT 299 297   Cardiac Enzymes: Recent Labs    11/12/17 1910 11/13/17 0039 11/13/17 0645  TROPONINI 0.03* 0.03* <0.03   BNP: Invalid input(s): POCBNP D-Dimer: No results for input(s): DDIMER in the last 72 hours. Hemoglobin A1C: No results for input(s): HGBA1C in the last 72 hours. Fasting Lipid Panel: No results for input(s): CHOL, HDL, LDLCALC, TRIG, CHOLHDL, LDLDIRECT in the last 72 hours. Thyroid Function Tests: Recent Labs    11/14/17 0440  TSH 5.874*   Anemia Panel: No results for input(s): VITAMINB12, FOLATE, FERRITIN, TIBC, IRON, RETICCTPCT in the last 72 hours.   PHYSICAL EXAM General: Well developed, well nourished, in no acute distress HEENT:  Normocephalic and atramatic Neck:  No JVD.  Lungs: Clear bilaterally to auscultation and percussion. Heart: HRRR . Normal S1 and S2 without gallops or murmurs.  Abdomen: Bowel sounds are positive, abdomen soft and non-tender  Msk:  Back normal,  normal gait. Normal strength and tone for age. Extremities: No clubbing, cyanosis or edema.   Neuro: Alert and oriented X 3. Psych:  Good affect, responds appropriately  TELEMETRY: Afib with RVR  ASSESSMENT AND PLAN: Non-Obstuctive CAD with stent in. D! Ok and normal LVEF but rapid Afib. Also had apnoeas in cath lab and needed to be put on BIPAP and go to ICU. Gave 0.25 IV digoxin and 40 mg lasix and placed foley. He is set up for split night sleep study next week on Thursday foe sleep study but will need to go home on cpap equipment . Will do DCCV once on cpap foe 2 weeks. Spent 1 hour with patient and family to arrange all this.  Principal Problem:   Atrial fibrillation with RVR (HCC) Active Problems:   Chest pain   Pulmonary edema cardiac cause (HCC)   Elevated troponin   CHF (congestive heart failure) (HCC)    Nathaniel Ibarra A, MD, [facility] 11/14/2017 9:34 AM

## 2017-11-14 NOTE — Progress Notes (Signed)
Told the patient that there was an order for Eliquis which is a new prescription for the patient. The patient and his wife were nervous about the medication given that it is a blood thinner. I explained the medication interactions and why it is appropriate for his current irregular heart beat however they were still nervous and wanted me to contact Dr. Manuella Ghazi. Dr. Manuella Ghazi returned my page and we spoke about the patient's hesitancy. Dr. Manuella Ghazi ordered me to hold the medication until Cardiology could see him tomorrow and explain further why he would need to be a blood thinner.

## 2017-11-14 NOTE — OR Nursing (Signed)
Dr Manuella Ghazi paged and made aware of condition, r/t past note, condom cath placed on patient .. MD to place patient in ICU

## 2017-11-14 NOTE — Consult Note (Signed)
Name: Nathaniel Ibarra MRN: [id number] DOB: 01/07/1941     CONSULTATION DATE: 11/12/2017  REFERRING MD :  Humphrey Rolls  CHIEF COMPLAINT:  SOB     HISTORY OF PRESENT ILLNESS: 76 year old pleasant white male admitted to the ICU status post cardiac cath for uncontrolled A. fib with RVR Patient was recently seen in the ER several days ago with A. fib RVR and was sent home and returned back within 24 hours for increased work of breathing and uncontrolled A. fib Patient was started on heparin infusion and amiodarone infusion heart rate was stabilized and then patient was plan to get cardiac cath which was performed today which did not show any new cardiac disease however he remained to be in A. fib with RVR  It seems that patient may have underlying sleep apnea that may have caused his underlying arrhythmia Patient and his wife notes that he snores really bad but does not know if he stops breathing at nighttime Patient is alert and awake following commands no apparent distress However patient did have respiratory difficulty when he was undergoing cardiac catheterization who was placed on 100% nonrebreather and then placed on BiPAP machine however both of those have been weaned off and patient now is on nasal cannula breathing and resting comfortably  I have discussed and explained to the patient and his wife what sleep apnea is and patient may have a diagnosis of sleep apnea but will need to have sleep study to confirm it however while he is in the hospital and in the stepdown status setting we can plan for auto CPAP therapy to treat probable underlying sleep apnea  Patient and wife agreed to undergo auto CPAP therapy tonight  PAST MEDICAL HISTORY :   has a past medical history of Bladder cancer ([place]), BPH (benign prostatic hyperplasia), GERD (gastroesophageal reflux disease), Hypertension, Myocardial infarction ([place]), and Thyroid disease.  has a past surgical history that includes Cardiac  catheterization (N/A, 09/01/2015); Cardiac catheterization (N/A, 09/01/2015); Cataract extraction w/PHACO (Left, 11/23/2016); and Cataract extraction w/PHACO (Right, 02/01/2017). Prior to Admission medications   Medication Sig Start Date End Date Taking? Authorizing Provider  amiodarone (PACERONE) 200 MG tablet Take 2 tablets (400 mg total) by mouth daily. 11/11/17  Yes Darel Hong, MD  amLODipine (NORVASC) 5 MG tablet Take 5 mg by mouth every Monday, Wednesday, and Friday.  05/09/15  Yes [provider]  aspirin EC 325 MG EC tablet Take 1 tablet (325 mg total) by mouth daily. Patient taking differently: Take 81 mg by mouth daily.  09/02/15  Yes Barnabas Harries, PA-C  buPROPion (WELLBUTRIN XL) 150 MG 24 hr tablet Take 150 mg by mouth daily.   Yes [provider]  finasteride (PROSCAR) 5 MG tablet Take 5 mg by mouth daily. 08/03/15  Yes [provider]  levothyroxine (SYNTHROID, LEVOTHROID) 50 MCG tablet Take 50 mcg by mouth every morning. 07/12/15  Yes [provider]  pantoprazole (PROTONIX) 40 MG tablet Take 40 mg by mouth daily as needed.    Yes [provider]  tamsulosin (FLOMAX) 0.4 MG CAPS capsule Take 0.4 mg by mouth 2 (two) times daily.  08/01/15  Yes [provider]  clopidogrel (PLAVIX) 75 MG tablet Take 1 tablet (75 mg total) by mouth once. Patient not taking: Reported on 11/11/2017 09/02/15   Jasmine Pang A, PA-C  ibuprofen (MOTRIN IB) 200 MG tablet Take 1 tablet (200 mg total) by mouth every 6 (six) hours as needed. Patient not taking: Reported on  11/11/2017 08/17/15   Epifanio Lesches, MD   No Known Allergies  FAMILY HISTORY:  family history includes Emphysema in his father and mother. SOCIAL HISTORY:  reports that  has never smoked. he has never used smokeless tobacco. He reports that he does not drink alcohol or use drugs.  REVIEW OF SYSTEMS:   Constitutional: Negative for fever, chills, weight loss, malaise/fatigue and  diaphoresis.  HENT: Negative for hearing loss, ear pain, nosebleeds, congestion, sore throat, neck pain, tinnitus and ear discharge.   Eyes: Negative for blurred vision, double vision, photophobia, pain, discharge and redness.  Respiratory: Negative for cough, hemoptysis, sputum production, shortness of breath, wheezing and stridor.   Cardiovascular: Negative for chest pain, palpitations, orthopnea, claudication, leg swelling and PND.  Gastrointestinal: Negative for heartburn, nausea, vomiting, abdominal pain, diarrhea, constipation, blood in stool and melena.  Genitourinary: Negative for dysuria, urgency, frequency, hematuria and flank pain.  Musculoskeletal: Negative for myalgias, back pain, joint pain and falls.  Skin: Negative for itching and rash.  Neurological: Negative for dizziness, tingling, tremors, sensory change, speech change, focal weakness, seizures, loss of consciousness, weakness and headaches.  Endo/Heme/Allergies: Negative for environmental allergies and polydipsia. Does not bruise/bleed easily.  ALL OTHER ROS ARE NEGATIVE    VITAL SIGNS: Temp:  [97.8 F (36.6 C)-97.9 F (36.6 C)] 97.8 F (36.6 C) (11/27 1200) Pulse Rate:  [46-123] 55 (11/27 1200) Resp:  [13-29] 22 (11/27 1200) BP: (120-158)/(65-95) 134/70 (11/27 1200) SpO2:  [85 %-99 %] 93 % (11/27 1200) Weight:  [199 lb 1.2 oz (90.3 kg)-199 lb 9.6 oz (90.5 kg)] 199 lb 1.2 oz (90.3 kg) (11/27 1200)  Physical Examination:   GENERAL:NAD, no fevers, chills, no weakness no fatigue HEAD: Normocephalic, atraumatic.  EYES: Pupils equal, round, reactive to light. Extraocular muscles intact. No scleral icterus.  MOUTH: Moist mucosal membrane.   EAR, NOSE, THROAT: Clear without exudates. No external lesions.  NECK: Supple. No thyromegaly. No nodules. No JVD.  PULMONARY:CTA B/L no wheezes, +crackles, no rhonchi CARDIOVASCULAR: S1 and S2. Regular rate and rhythm. No murmurs, rubs, or gallops. No edema.  GASTROINTESTINAL:  Soft, nontender, nondistended. No masses. Positive bowel sounds.  MUSCULOSKELETAL: No swelling, clubbing, or edema. Range of motion full in all extremities.  NEUROLOGIC: Cranial nerves II through XII are intact. No gross focal neurological deficits.  SKIN: No ulceration, lesions, rashes, or cyanosis. Skin warm and dry. Turgor intact.  PSYCHIATRIC: Mood, affect within normal limits. The patient is awake, alert and oriented x 3. Insight, judgment intact.      Recent Labs  Lab 11/13/17 0645 11/13/17 0850 11/14/17 0440  NA 138 138 139  K 3.9 4.0 4.0  CL 106 106 104  CO2 25 26 27   BUN 22* 21* 21*  CREATININE 0.94 0.97 0.89  GLUCOSE 111* 135* 117*   Recent Labs  Lab 11/13/17 0645 11/13/17 0850 11/14/17 0440  HGB 13.0 13.8 13.7  HCT 38.4* 41.0 39.5*  WBC 10.0 11.3* 9.6  PLT 285 299 297   X-ray Chest Pa And Lateral  Result Date: 11/13/2017 CLINICAL DATA:  76 year old male with congestive heart failure EXAM: CHEST  2 VIEW COMPARISON:  Prior chest x-ray 11/11/2017 FINDINGS: Stable cardiomegaly. Pulmonary vascular congestion with diffuse interstitial prominence and a small amount of fluid tracking along the minor fissure. Kerley B-lines are present in the periphery of the lungs. Findings are most consistent with mild interstitial pulmonary edema. Small bilateral pleural effusions are present with associated atelectasis. No pneumothorax. No acute osseous abnormality. IMPRESSION: Mild to  moderate congestive heart failure. Small bilateral pleural effusions. Electronically Signed   By: Jacqulynn Cadet M.D.   On: 11/13/2017 07:46   I have Independently reviewed images of  CXR   on 11/14/2017 Interpretation: b/l interstitial infiltrates, likely edema  ASSESSMENT / PLAN: 76 year old white male admitted to ICU for uncontrolled A. fib with RVR in the setting of probable underlying sleep apnea in the setting of underlying coronary artery disease and at this time will treat empirically with auto  CPAP therapy for probable underlying sleep apnea  #1 try auto CPAP tonight #2 continue amiodarone infusion these medicines are to be adjusted by cardiology #3 oxygen as needed #4 continue stepdown status monitoring   Patient satisfied with Plan of action and management. All questions answered  Corrin Parker, M.D.  [facility]  Medical Director [place] Director [facility] Cardio-Pulmonary Department

## 2017-11-14 NOTE — Progress Notes (Signed)
To Specials via bed 

## 2017-11-15 ENCOUNTER — Inpatient Hospital Stay
Admit: 2017-11-15 | Discharge: 2017-11-15 | Disposition: A | Discharge status: Home / Self Care | Payer: PPO | Attending: Cardiovascular Disease | Admitting: Cardiovascular Disease

## 2017-11-15 ENCOUNTER — Encounter: Payer: Self-pay | Admitting: Cardiovascular Disease

## 2017-11-15 ENCOUNTER — Inpatient Hospital Stay: Payer: PPO

## 2017-11-15 LAB — GLUCOSE, CAPILLARY: GLUCOSE-CAPILLARY: 94 mg/dL (ref 65–99)

## 2017-11-15 MED ORDER — PANTOPRAZOLE SODIUM 40 MG PO TBEC: 40.0000 mg | DELAYED_RELEASE_TABLET | Freq: Two times a day (BID) | ORAL | Status: DC | PRN

## 2017-11-15 MED ORDER — POTASSIUM CHLORIDE CRYS ER 20 MEQ PO TBCR: 20.0000 meq | EXTENDED_RELEASE_TABLET | Freq: Two times a day (BID) | ORAL | Status: DC | PRN

## 2017-11-15 MED ORDER — AMIODARONE HCL 200 MG PO TABS
400.0000 mg | ORAL_TABLET | Freq: Two times a day (BID) | ORAL | Status: DC
  Administered 2017-11-15 – 2017-11-16 (×2): 400 mg via ORAL
  Filled 2017-11-15 (×3): qty 2

## 2017-11-15 MED ORDER — POTASSIUM CHLORIDE CRYS ER 20 MEQ PO TBCR
20.0000 meq | EXTENDED_RELEASE_TABLET | Freq: Two times a day (BID) | ORAL | Status: DC
  Administered 2017-11-15 – 2017-11-17 (×4): 20 meq via ORAL
  Filled 2017-11-15 (×4): qty 1

## 2017-11-15 NOTE — Progress Notes (Signed)
Patient has had 3 loose stools per nurse tech. Notified Dr.Willis will observe next stool before testing. No new orders at this time.

## 2017-11-15 NOTE — Progress Notes (Signed)
SUBJECTIVE: Pt reports feeling well with breathing improved on nasal canula. He denies chest pain or palpitations.   Vitals:   11/15/17 0500 11/15/17 0600 11/15/17 0700 11/15/17 0800  BP: (!) 151/70 140/72 139/89 (!) 154/66  Pulse: 64 66 (!) 59 91  Resp: 19 20 (!) 22 (!) 33  Temp:    98.8 F (37.1 C)  TempSrc:    Oral  SpO2: 94% 93% 93% 95%  Weight: 199 lb 1.2 oz (90.3 kg)     Height:        Intake/Output Summary (Last 24 hours) at 11/15/2017 0907 Last data filed at 11/15/2017 0800 Gross per 24 hour  Intake 3465.95 ml  Output 3550 ml  Net -84.05 ml    LABS: Basic Metabolic Panel: Recent Labs    11/13/17 0850 11/14/17 0440  NA 138 139  K 4.0 4.0  CL 106 104  CO2 26 27  GLUCOSE 135* 117*  BUN 21* 21*  CREATININE 0.97 0.89  CALCIUM 8.3* 8.3*   Liver Function Tests: Recent Labs    11/13/17 0645  AST 16  ALT 18  ALKPHOS 65  BILITOT 0.7  PROT 6.0*  ALBUMIN 3.2*   No results for input(s): LIPASE, AMYLASE in the last 72 hours. CBC: Recent Labs    11/13/17 0850 11/14/17 0440  WBC 11.3* 9.6  HGB 13.8 13.7  HCT 41.0 39.5*  MCV 93.4 93.5  PLT 299 297   Cardiac Enzymes: Recent Labs    11/12/17 1910 11/13/17 0039 11/13/17 0645  TROPONINI 0.03* 0.03* <0.03   BNP: Invalid input(s): POCBNP D-Dimer: No results for input(s): DDIMER in the last 72 hours. Hemoglobin A1C: No results for input(s): HGBA1C in the last 72 hours. Fasting Lipid Panel: No results for input(s): CHOL, HDL, LDLCALC, TRIG, CHOLHDL, LDLDIRECT in the last 72 hours. Thyroid Function Tests: Recent Labs    11/14/17 0440  TSH 5.874*   Anemia Panel: No results for input(s): VITAMINB12, FOLATE, FERRITIN, TIBC, IRON, RETICCTPCT in the last 72 hours.   PHYSICAL EXAM General: Pale, slightly short of breath HEENT:  Normocephalic and atramatic Neck:  No JVD.  Lungs: Clear bilaterally to auscultation and percussion. Heart: Irregular Abdomen: Bowel sounds are positive, abdomen soft and  non-tender  Msk:  Back normal, normal gait. Normal strength and tone for age. Extremities: No clubbing, cyanosis or edema.   Neuro: Alert and oriented X 3. Psych:  Good affect, responds appropriately  TELEMETRY: Afib with rate controlled 65-90bpm, frequent PVCs  ASSESSMENT AND PLAN: Atrial fibrillation, currently rate controlled likely secondary to sleep apnea. May switch amiodarone drip to PO dosing, 400mg  BID for now and will wean as tolerated to maintenance dose. Discussed need for Eliquis with pt and his wife and they consented to starting Eliquis.  RN was informed and first dose was given.   Principal Problem:   Atrial fibrillation with RVR (HCC) Active Problems:   Chest pain   Pulmonary edema cardiac cause (HCC)   Elevated troponin   CHF (congestive heart failure) ([place])    Jake Bathe, NP-C 11/15/2017 9:07 AM

## 2017-11-15 NOTE — Progress Notes (Signed)
Pt refuses to wear CPAP at this time, O2 84% room air. Pt placed on a non-rebreather at this time, O2 improves to 97%. Pt comfortable at this time.

## 2017-11-15 NOTE — Plan of Care (Signed)
Patient want to ambulate independently but has a history of falls. Patient complains of weakness. Ambulate with patient to restroom and within the room.

## 2017-11-15 NOTE — Progress Notes (Signed)
[facility] NAME: Nathaniel Ibarra    MR#:  [id number]  DATE OF BIRTH:  February 15, 1941  SUBJECTIVE:  CHIEF COMPLAINT:   Chief Complaint  Patient presents with  . Irregular Heart Beat  feels better, REVIEW OF SYSTEMS:  Review of Systems  Constitutional: Positive for malaise/fatigue. Negative for diaphoresis, fever and weight loss.  HENT: Negative for ear discharge, ear pain, hearing loss, nosebleeds, sore throat and tinnitus.   Eyes: Negative for blurred vision and pain.  Respiratory: Positive for shortness of breath. Negative for cough, hemoptysis and wheezing.   Cardiovascular: Positive for chest pain and palpitations. Negative for orthopnea and leg swelling.  Gastrointestinal: Negative for abdominal pain, blood in stool, constipation, diarrhea, heartburn, nausea and vomiting.  Genitourinary: Negative for dysuria, frequency and urgency.  Musculoskeletal: Negative for back pain and myalgias.  Skin: Negative for itching and rash.  Neurological: Positive for weakness. Negative for dizziness, tingling, tremors, focal weakness, seizures and headaches.  Psychiatric/Behavioral: Negative for depression. The patient is not nervous/anxious.    DRUG ALLERGIES:  No Known Allergies VITALS:  Blood pressure (!) 162/69, pulse 66, temperature 98.4 F (36.9 C), temperature source Oral, resp. rate 18, height 6' (1.829 m), weight 90.3 kg (199 lb 1.2 oz), SpO2 98 %. PHYSICAL EXAMINATION:  Physical Exam  Constitutional: He is oriented to person, place, and time and well-developed, well-nourished, and in no distress.  HENT:  Head: Normocephalic and atraumatic.  Eyes: Conjunctivae and EOM are normal. Pupils are equal, round, and reactive to light.  Neck: Normal range of motion. Neck supple. No tracheal deviation present. No thyromegaly present.  Cardiovascular: Normal rate, regular rhythm and normal heart sounds.  Pulmonary/Chest: Effort normal and  breath sounds normal. No respiratory distress. He has no wheezes. He exhibits no tenderness.  Abdominal: Soft. Bowel sounds are normal. He exhibits no distension. There is no tenderness.  Musculoskeletal: Normal range of motion.  Neurological: He is alert and oriented to person, place, and time. No cranial nerve deficit.  Skin: Skin is warm and dry. No rash noted.  Psychiatric: Mood and affect normal.   LABORATORY PANEL:  Male CBC Recent Labs  Lab 11/14/17 0440  WBC 9.6  HGB 13.7  HCT 39.5*  PLT 297   ------------------------------------------------------------------------------------------------------------------ Chemistries  Recent Labs  Lab 11/13/17 0645  11/14/17 0440  NA 138   < > 139  K 3.9   < > 4.0  CL 106   < > 104  CO2 25   < > 27  GLUCOSE 111*   < > 117*  BUN 22*   < > 21*  CREATININE 0.94   < > 0.89  CALCIUM 8.1*   < > 8.3*  AST 16  --   --   ALT 18  --   --   ALKPHOS 65  --   --   BILITOT 0.7  --   --    < > = values in this interval not displayed.   RADIOLOGY:  Dg Chest Port 1 View  Result Date: 11/15/2017 CLINICAL DATA:  No story failure. Cardiac catheterization yesterday. EXAM: PORTABLE CHEST 1 VIEW COMPARISON:  11/13/2017 FINDINGS: Borderline cardiomegaly. Mediastinal shadows are normal. Venous hypertension with mild interstitial edema. No consolidation. Mild atelectasis at both lung bases. IMPRESSION: Mild interstitial edema persists. Mild atelectasis at the lung bases. Electronically Signed   By: Nelson Chimes M.D.   On: 11/15/2017 11:18   ASSESSMENT AND PLAN:  76 y.o.  male has a past medical history significant for CAD, HTN, thyroid disease now with weakness and SOB with some chest discomfort. 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 Management plans discussed with the patient, family and they are in agreement.  CODE STATUS: Full Code  TOTAL TIME TAKING CARE OF THIS PATIENT: 25 minutes.   More than 50% of the time was spent in counseling/coordination of care: YES  POSSIBLE D/C IN 1-2 DAYS, DEPENDING ON CLINICAL CONDITION.   Max Sane M.D on 11/15/2017 at 4:28 PM  Between 7am to 6pm - Pager - [phone]  After 6pm go to [email or web address] - Proofreader  [facility]  Office  [phone]  CC: Primary care physician; Albina Billet, MD  Note: This dictation was prepared with Dragon dictation along with smaller phrase technology. Any transcriptional errors that result from this process are unintentional.

## 2017-11-15 NOTE — Progress Notes (Signed)
  Echocardiogram 2D Echocardiogram has been performed.  Jennette Dubin 11/15/2017, 9:46 AM

## 2017-11-16 LAB — CBC
HCT: 40.4 % (ref 40.0–52.0)
Hemoglobin: 14.1 g/dL (ref 13.0–18.0)
MCH: 32.1 pg (ref 26.0–34.0)
MCHC: 34.8 g/dL (ref 32.0–36.0)
MCV: 92.2 fL (ref 80.0–100.0)
PLATELETS: 316 10*3/uL (ref 150–440)
RBC: 4.38 MIL/uL — AB (ref 4.40–5.90)
RDW: 12.7 % (ref 11.5–14.5)
WBC: 8.8 10*3/uL (ref 3.8–10.6)

## 2017-11-16 LAB — BASIC METABOLIC PANEL
Anion gap: 7 (ref 5–15)
BUN: 23 mg/dL — AB (ref 6–20)
CO2: 29 mmol/L (ref 22–32)
Calcium: 8.4 mg/dL — ABNORMAL LOW (ref 8.9–10.3)
Chloride: 102 mmol/L (ref 101–111)
Creatinine, Ser: 0.9 mg/dL (ref 0.61–1.24)
GFR calc Af Amer: >60 mL/min (ref >60)
GLUCOSE: 168 mg/dL — AB (ref 65–99)
POTASSIUM: 3.5 mmol/L (ref 3.5–5.1)
Sodium: 138 mmol/L (ref 135–145)

## 2017-11-16 LAB — GLUCOSE, CAPILLARY: Glucose-Capillary: 94 mg/dL (ref 65–99)

## 2017-11-16 LAB — ECHOCARDIOGRAM COMPLETE
HEIGHTINCHES: 72 in
Weight: 3185.21 oz

## 2017-11-16 MED ORDER — AMIODARONE HCL 200 MG PO TABS
400.0000 mg | ORAL_TABLET | Freq: Every day | ORAL | Status: DC
  Administered 2017-11-17: 400 mg via ORAL
  Filled 2017-11-16: qty 2

## 2017-11-16 MED ORDER — METOPROLOL SUCCINATE ER 25 MG PO TB24
25.0000 mg | ORAL_TABLET | Freq: Every day | ORAL | Status: DC
  Administered 2017-11-16 – 2017-11-17 (×2): 25 mg via ORAL
  Filled 2017-11-16 (×2): qty 1

## 2017-11-16 NOTE — Evaluation (Signed)
Occupational Therapy Evaluation Patient Details Name: Nathaniel Ibarra MRN: [id number] DOB: 16-Jul-1941 Today's Date: 11/16/2017    History of Present Illness Pt admitted for Afib with RVR. Pt with initial complaints of chest pain and weakness. History includes CAD, HTN, bladder ca, GERD, and MI. Of note, stay complicated by cardiac cath performed on 11/27 with increased respiratory distress, transferred to CCU on bipap. Since then, pt has been weaned to [facility] and now to RA. Pt currently off amiodarone drip at this time.   Clinical Impression   Pt seen for OT evaluation this date. 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 Pt is eager to return home. Pt presents near baseline functional independence with ADL tasks, however requires increased time and effort to perform ADL. Pt/spouse educated in energy conservation strategies (handout provided) including activity pacing, AE/DME, home/routines modifications, work simplification, and pursed lip breathing to maximize functional independence. O2 sats noted to be 87-93% on room air while at rest in bed, HR 59-61. With VC to use pursed lip breathing, O2 sats would increase. Encouraged pt/spouse to consider getting a wedge pillow to improve O2 sats overnight and get a pulse oximeter to monitor O2 and heart rate at home. Pt/spouse verbalized understanding of education/training provided this date, and would benefit from skilled OT services while in the hospital to maximize return to PLOF and minimize risk of falls, SOB, and exacerbation leading to rehospitalization.     Follow Up Recommendations  No OT follow up    Equipment Recommendations  Other (comment)(reacher, sock aid, LH shoe horn, LH sponge)    Recommendations for Other Services       Precautions / Restrictions Precautions Precautions: Fall Restrictions Weight Bearing Restrictions: No       Mobility Bed Mobility      General bed mobility comments: deferred, PT in room to further assess mobility  Transfers             General transfer comment: deferred, PT in room to further assess mobility    Balance                              ADL either performed or assessed with clinical judgement   ADL Overall ADL's : Modified independent                                       General ADL Comments: Grossly at baseline, requiring additional time/effort to perform ADL tasks. Pt/spouse educated in AE/DME to support energy exertion/conservation and minimize SOB. Handout provided.     Vision Patient Visual Report: No change from baseline       Perception     Praxis      Pertinent Vitals/Pain Pain Assessment: No/denies pain     Hand Dominance     Extremity/Trunk Assessment Upper Extremity Assessment Upper Extremity Assessment: Overall WFL for tasks assessed   Lower Extremity Assessment Lower Extremity Assessment: Overall WFL for tasks assessed   Cervical / Trunk Assessment Cervical / Trunk Assessment: Normal   Communication Communication Communication: No difficulties   Cognition Arousal/Alertness: Awake/alert Behavior During Therapy: WFL for tasks assessed/performed Overall Cognitive Status: Within Functional Limits for tasks assessed  General Comments       Exercises Other Exercises Other Exercises: Pt/spouse educated in energy conservation strategies including activity pacing, AE/DME, work simplification, and pursed lip breathing. Handout provided.   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 Able to walk laps in home during bad weather.        OT Problem List: Decreased activity tolerance;Cardiopulmonary status limiting activity      OT Treatment/Interventions: Self-care/ADL training;Energy conservation;DME and/or AE instruction;Patient/family education    OT Goals(Current goals can be found in the care plan section) Acute Rehab OT Goals Patient Stated Goal: to go home OT Goal Formulation: With patient/family Time For Goal Achievement: 11/30/17 Potential to Achieve Goals: Good ADL Goals Pt Will Perform Lower Body Dressing: with set-up;with modified independence;with adaptive equipment(VSS, no SOB) Additional ADL Goal #1: Pt/spouse will verbalize plan to implement at least 2 learned ECS to support safety and functional independence while minimizing risk of over exertion/SOB.  OT Frequency: Min 1X/week   Barriers to D/C:            Co-evaluation              AM-PAC PT "6 Clicks" Daily Activity     Outcome Measure Help from another person eating meals?: None Help from another person taking care of personal grooming?: None Help from another person toileting, which includes using toliet, bedpan, or urinal?: None Help from another person bathing (including washing, rinsing, drying)?: A Little Help from another person to put on and taking off regular upper body clothing?: None Help from another person to put on and taking off regular lower body clothing?: A Little 6 Click Score: 22   End of Session    Activity Tolerance: Patient tolerated treatment well Patient left: in bed;with call bell/phone within reach;with bed alarm set;with family/visitor present;Other  (comment)  OT Visit Diagnosis: Other abnormalities of gait and mobility (R26.89)                Time: 6004-5997 OT Time Calculation (min): 31 min Charges:  OT General Charges $OT Visit: 1 Visit OT Evaluation $OT Eval Low Complexity: 1 Low OT Treatments $Self Care/Home Management : 23-37 mins G-Codes: OT G-codes **NOT FOR INPATIENT CLASS** Functional Assessment Tool Used: AM-PAC 6 Clicks Daily Activity;Clinical judgement Functional Limitation: Self care Self Care Current Status (F4142): At least 20 percent but less than 40 percent impaired, limited or restricted Self Care Goal Status (L9532): At least 1 percent but less than 20 percent impaired, limited or restricted   Jeni Salles, MPH, MS, OTR/L ascom [phone] 11/16/17, 3:42 PM

## 2017-11-16 NOTE — Consult Note (Signed)
   [facility] Inpatient Consult   11/16/2017  Nathaniel Ibarra 23-Sep-1941 [id number]   [facility] Care Management referral received from inpatient RNCM.   Spoke with inpatient RNCM prior to contacting patient.  Spoke with [name] who asked that writer speak with his wife about [facility] Management program.  Spoke with [name], per patient request, to discuss [facility] Management services.   She states "we have a lot going on right now and I do not think we need to sign up right  now". Provided [name] the [name] Management telephone number and the 24-hr nurse advice line. She also declined CHF EMMI calls.   Left voicemail for inpatient RNCM to make aware that patient's wife declined [facility] Management on his behalf.     Marthenia Rolling, MSN-Ed, RN,BSN [facility] Liaison [phone]

## 2017-11-16 NOTE — Progress Notes (Signed)
* [facility]    ASSESSMENT / PLAN: 76 year old white male admitted to ICU for uncontrolled A. fib with RVR in the setting of probable underlying sleep apnea in the setting of underlying coronary artery disease and at this time will treat empirically with auto CPAP therapy for probable underlying sleep apnea  -I discussed with the patient about the rationale for treatment of her sleep apnea as well as sleep apnea testing. --She has a sleep study as an outpatient next Thursday  at [facility] and follow-up with Dr. Chancy Milroy.  She has significant confusion about who to follow-up and went, therefore I encouraged her to continue to [facility] with [facility] and follow-up therefore her sleep study results.  If she has questions or would like to see a sleep physician I gave her my card and she can follow-up with [place] as needed.  All the patient's and his wife's questions were answered.  Service will sign off for now, please call if there are any further questions or concerns.   Date: 11/16/2017  MRN# [id number] Nathaniel Ibarra 22-Dec-1940   Nathaniel Ibarra is a 76 y.o. old male seen in follow up for chief complaint of  Chief Complaint  Patient presents with  . Irregular Heart Beat     HPI:   Patient is awake and alert, he has no particular complaints today.  Patient's wife is at bedside, she provides some of the history.  Medication:    Current Facility-Administered Medications:  .  0.9 %  sodium chloride infusion, 250 mL, Intravenous, PRN, Neoma Laming A, MD .  acetaminophen (TYLENOL) tablet 650 mg, 650 mg, Oral, Q6H PRN **OR** acetaminophen (TYLENOL) suppository 650 mg, 650 mg, Rectal, Q6H PRN, Max Sane, MD .  acetaminophen (TYLENOL) tablet 650 mg, 650 mg, Oral, Q4H PRN, Dionisio David, MD .  Derrill Memo ON 11/17/2017] amiodarone (PACERONE) tablet 400 mg, 400 mg, Oral, Daily, Jake Bathe, FNP .  apixaban (ELIQUIS) tablet 5 mg, 5 mg, Oral, BID,  Lifsey, Betti Cruz, RPH, 5 mg at 11/16/17 1517 .  aspirin EC tablet 81 mg, 81 mg, Oral, Daily, Idelle Crouch, MD, 81 mg at 11/16/17 0803 .  buPROPion (WELLBUTRIN XL) 24 hr tablet 150 mg, 150 mg, Oral, Daily, Idelle Crouch, MD, 150 mg at 11/16/17 0802 .  finasteride (PROSCAR) tablet 5 mg, 5 mg, Oral, Daily, Idelle Crouch, MD, 5 mg at 11/16/17 0802 .  furosemide (LASIX) injection 40 mg, 40 mg, Intravenous, Q12H, Idelle Crouch, MD, 40 mg at 11/16/17 0802 .  levothyroxine (SYNTHROID, LEVOTHROID) tablet 75 mcg, 75 mcg, Oral, Kingsley Callander, MD, 75 mcg at 11/16/17 0610 .  metoprolol succinate (TOPROL-XL) 24 hr tablet 25 mg, 25 mg, Oral, Daily, Jake Bathe, FNP, 25 mg at 11/16/17 1202 .  nitroGLYCERIN (NITROGLYN) 2 % ointment 0.5 inch, 0.5 inch, Topical, Q6H, Sparks, Leonie [place], MD, 0.5 inch at 11/16/17 1202 .  nitroGLYCERIN (NITROSTAT) SL tablet 0.4 mg, 0.4 mg, Sublingual, Q5 min PRN, Idelle Crouch, MD .  [DISCONTINUED] ondansetron (ZOFRAN) tablet 4 mg, 4 mg, Oral, Q6H PRN **OR** ondansetron (ZOFRAN) injection 4 mg, 4 mg, Intravenous, Q6H PRN, Idelle Crouch, MD, 4 mg at 11/14/17 6160 .  ondansetron (ZOFRAN) injection 4 mg, 4 mg, Intravenous, Q6H PRN, Neoma Laming A, MD .  potassium chloride SA (K-DUR,KLOR-CON) CR tablet 20 mEq, 20 mEq, Oral, BID, Wilhelmina Mcardle, MD, 20 mEq at 11/16/17 0803 .  sodium chloride flush (NS) 0.9 % injection  3 mL, 3 mL, Intravenous, Q12H, Neoma Laming A, MD, 3 mL at 11/16/17 0804 .  sodium chloride flush (NS) 0.9 % injection 3 mL, 3 mL, Intravenous, PRN, Neoma Laming A, MD .  tamsulosin [facility]) capsule 0.4 mg, 0.4 mg, Oral, BID, Idelle Crouch, MD, 0.4 mg at 11/16/17 0803 .  temazepam (RESTORIL) capsule 7.5 mg, 7.5 mg, Oral, QHS PRN, Salary, Montell D, MD, 7.5 mg at 11/13/17 2134   Allergies:  Patient has no known allergies.  Review of Systems: Gen:  Denies  fever, sweats. HEENT: Denies blurred vision. Cvc:  No dizziness, chest  pain or heaviness Resp:   Denies cough or sputum porduction. Gi: Denies swallowing difficulty, stomach pain. constipation, bowel incontinence Gu:  Denies bladder incontinence, burning urine Ext:   No Joint pain, stiffness. Skin: No skin rash, easy bruising. Endoc:  No polyuria, polydipsia. Psych: No depression, insomnia. Other:  All other systems were reviewed and found to be negative other than what is mentioned in the HPI.   Physical Examination:   VS: BP 120/84 (BP Location: Left Arm)   Pulse (!) 126   Temp (!) 97.5 F (36.4 C) (Oral)   Resp 16   Ht 6' (1.829 m)   Wt 195 lb (88.5 kg)   SpO2 93%   BMI 26.45 kg/m    General Appearance: No distress  Neuro:without focal findings,  speech normal,  HEENT: PERRLA, EOM intact. Pulmonary: normal breath sounds, No wheezing.   CardiovascularNormal S1,S2.  No m/r/g.   Abdomen: Benign, Soft, non-tender. Renal:  No costovertebral tenderness  GU:  Not performed at this time. Endoc: No evident thyromegaly, no signs of acromegaly. Skin:   warm, no rash. Extremities: normal, no cyanosis, clubbing.   LABORATORY PANEL:   CBC Recent Labs  Lab 11/16/17 0448  WBC 8.8  HGB 14.1  HCT 40.4  PLT 316   ------------------------------------------------------------------------------------------------------------------  Chemistries  Recent Labs  Lab 11/13/17 0645  11/16/17 0448  NA 138   < > 138  K 3.9   < > 3.5  CL 106   < > 102  CO2 25   < > 29  GLUCOSE 111*   < > 168*  BUN 22*   < > 23*  CREATININE 0.94   < > 0.90  CALCIUM 8.1*   < > 8.4*  AST 16  --   --   ALT 18  --   --   ALKPHOS 65  --   --   BILITOT 0.7  --   --    < > = values in this interval not displayed.   ------------------------------------------------------------------------------------------------------------------  Cardiac Enzymes Recent Labs  Lab 11/13/17 0645  TROPONINI <0.03    ------------------------------------------------------------  RADIOLOGY:   No results found for this or any previous visit. Results for orders placed during the hospital encounter of 11/12/17  X-ray chest PA and lateral   Narrative CLINICAL DATA:  76 year old male with congestive heart failure  EXAM: CHEST  2 VIEW  COMPARISON:  Prior chest x-ray 11/11/2017  FINDINGS: Stable cardiomegaly. Pulmonary vascular congestion with diffuse interstitial prominence and a small amount of fluid tracking along the minor fissure. Kerley B-lines are present in the periphery of the lungs. Findings are most consistent with mild interstitial pulmonary edema. Small bilateral pleural effusions are present with associated atelectasis. No pneumothorax. No acute osseous abnormality.  IMPRESSION: Mild to moderate congestive heart failure.  Small bilateral pleural effusions.   Electronically Signed   By: Dellis Filbert.D.  On: 11/13/2017 07:46    ------------------------------------------------------------------------------------------------------------------  Thank  you for allowing [facility], Critical Care to assist in the care of your patient. Our recommendations are noted above.  Please contact us if we can be of further service.   Marda Stalker, MD.  [facility] Office Number: [phone]  Patricia Pesa, M.D.  Merton Border, M.D  11/16/2017

## 2017-11-16 NOTE — Progress Notes (Signed)
SATURATION QUALIFICATIONS: (This note is used to comply with regulatory documentation for home oxygen)  Patient Saturations on Room Air at Rest = 93%  Patient Saturations on Room Air while Ambulating = 88%  Patient Saturations on 2 Liters of oxygen while Ambulating = 94%  Please briefly explain why patient needs home oxygen: Patient became SOB with exertion, oxygen saturations were check and patient was 88% on room air.

## 2017-11-16 NOTE — Progress Notes (Signed)
SUBJECTIVE: Feeling well. No shortness of breath on room air. Reports better tolerating CPAP last night.    Vitals:   11/15/17 1945 11/16/17 0500 11/16/17 0506 11/16/17 0740  BP: 133/68  140/75 (!) 146/73  Pulse: 61  68 65  Resp: 18   16  Temp: 99 F (37.2 C)  (!) 97.4 F (36.3 C) (!) 97.5 F (36.4 C)  TempSrc: Oral  Oral Oral  SpO2: 96%  95% 93%  Weight:  195 lb (88.5 kg)    Height:        Intake/Output Summary (Last 24 hours) at 11/16/2017 0827 Last data filed at 11/16/2017 0513 Gross per 24 hour  Intake 383.1 ml  Output 900 ml  Net -516.9 ml    LABS: Basic Metabolic Panel: Recent Labs    11/14/17 0440 11/16/17 0448  NA 139 138  K 4.0 3.5  CL 104 102  CO2 27 29  GLUCOSE 117* 168*  BUN 21* 23*  CREATININE 0.89 0.90  CALCIUM 8.3* 8.4*   Liver Function Tests: No results for input(s): AST, ALT, ALKPHOS, BILITOT, PROT, ALBUMIN in the last 72 hours. No results for input(s): LIPASE, AMYLASE in the last 72 hours. CBC: Recent Labs    11/14/17 0440 11/16/17 0448  WBC 9.6 8.8  HGB 13.7 14.1  HCT 39.5* 40.4  MCV 93.5 92.2  PLT 297 316   Cardiac Enzymes: No results for input(s): CKTOTAL, CKMB, CKMBINDEX, TROPONINI in the last 72 hours. BNP: Invalid input(s): POCBNP D-Dimer: No results for input(s): DDIMER in the last 72 hours. Hemoglobin A1C: No results for input(s): HGBA1C in the last 72 hours. Fasting Lipid Panel: No results for input(s): CHOL, HDL, LDLCALC, TRIG, CHOLHDL, LDLDIRECT in the last 72 hours. Thyroid Function Tests: Recent Labs    11/14/17 0440  TSH 5.874*   Anemia Panel: No results for input(s): VITAMINB12, FOLATE, FERRITIN, TIBC, IRON, RETICCTPCT in the last 72 hours.   PHYSICAL EXAM General: Pale, sitting up and eating  HEENT:  Normocephalic and atramatic Neck:  No JVD.  Lungs: Clear bilaterally to auscultation and percussion. Heart: HRRR . Normal S1 and S2 without gallops or murmurs.  Abdomen: Bowel sounds are positive, abdomen  soft and non-tender  Msk:  Back normal, normal gait. Normal strength and tone for age. Extremities: No clubbing, cyanosis or edema.   Neuro: Alert and oriented X 3. Psych:  Good affect, responds appropriately  TELEMETRY: Atrial flutter rate controled 68bpm  ASSESSMENT AND PLAN: Atrial flutter with rate controlled. 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 Taking Eliquis and starting amiodarone taper.  Stable for discharge from cardiac perspective with close outpatient follow up.  Advise discharge on amiodarone 400mg  QD, 20mg  oral lasix QD, metoprolol 25mg  QD and will see pt Monday 12/3 at 10am at [facility].  Principal Problem:   Atrial fibrillation with RVR [facility]) Active Problems:   Chest pain   Pulmonary edema cardiac cause (HCC)   Elevated troponin   CHF (congestive heart failure) ([place])    Jake Bathe, NP-C 11/16/2017 8:27 AM  Contact: [phone]

## 2017-11-16 NOTE — Plan of Care (Signed)
Patient continues to attempt to ambulate independently. Reinforce to patient the importance of safety while in the hospital.

## 2017-11-16 NOTE — Evaluation (Signed)
Physical Therapy Evaluation Patient Details Name: Nathaniel Ibarra MRN: [id number] DOB: 11/19/1941 Today's Date: 11/16/2017   History of Present Illness  Pt admitted for Afib with RVR. Pt with initial complaints of chest pain and weakness. History includes CAD, HTN, bladder ca, GERD, and MI. Of note, stay complicated by cardiac cath performed on 11/27 with increased respiratory distress, transferred to CCU on bipap. Since then, pt has been weaned to [facility] and now to RA. Pt currently off amiodarone drip at this time.  Clinical Impression  Pt is a pleasant 76 year old male who was admitted for Afib with RVR. Pt had recently just finished ambulating with RN staff and slightly fatigued at time of evaluation. Pt demonstrates all bed mobility/transfers/ambulation at baseline level. Pt encouraged to continue mobility efforts with RN staff, however does not need formal PT in acute setting or follow up at discharge. All mobility performed without AD and on room air. Safe technique noted. Pt appears motivated to return back home. Pt does not require any further PT needs at this time. Pt will be dc in house and does not require follow up. RN aware. Will dc current orders.      Follow Up Recommendations No PT follow up    Equipment Recommendations  None recommended by PT    Recommendations for Other Services       Precautions / Restrictions Precautions Precautions: Fall Restrictions Weight Bearing Restrictions: No      Mobility  Bed Mobility Overal bed mobility: Needs Assistance Bed Mobility: Supine to Sit     Supine to sit: Min guard     General bed mobility comments: needed slight HHA for trunk mobility. Once seated at EOB, able to sit with upright posture  Transfers Overall transfer level: Modified independent Equipment used: None             General transfer comment: pushes from seated surface. Once standing, able to stand with erect posture and no LOB. All mobility performed  on Room Air.  Ambulation/Gait Ambulation/Gait assistance: Supervision Ambulation Distance (Feet): 80 Feet Assistive device: None Gait Pattern/deviations: WFL(Within Functional Limits)     General Gait Details: walked several laps in room due to fatigue from just ambulating in hallway with RN. Pt able to safely avoid obstacles and maintain balance during turns. O2 sats at 92% with exertion, no SOB symptoms noted.   Stairs            Wheelchair Mobility    Modified Rankin (Stroke Patients Only)       Balance Overall balance assessment: Needs assistance Sitting-balance support: Feet supported Sitting balance-Leahy Scale: Normal     Standing balance support: No upper extremity supported Standing balance-Leahy Scale: Good                               Pertinent Vitals/Pain Pain Assessment: No/denies pain    Home Living Family/patient expects to be discharged to:: Private residence Living Arrangements: Spouse/significant other Available Help at Discharge: Family Type of Home: House Home Access: Stairs to enter   Technical brewer of Steps: 3 Home Layout: One level Home Equipment: None      Prior Function Level of Independence: Independent         Comments: 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 Able to walk laps in home during bad weather     Hand Dominance        Extremity/Trunk Assessment  Upper Extremity Assessment Upper Extremity Assessment: Overall WFL for tasks assessed    Lower Extremity Assessment Lower Extremity Assessment: Overall WFL for tasks assessed       Communication   Communication: No difficulties  Cognition Arousal/Alertness: Awake/alert   Overall Cognitive Status: Within Functional Limits for tasks assessed                                        General Comments      Exercises     Assessment/Plan    PT Assessment Patent does not need any further PT services  PT Problem List          PT Treatment Interventions      PT Goals (Current goals can be found in the Care Plan section)  Acute Rehab PT Goals Patient Stated Goal: to go home PT Goal Formulation: All assessment and education complete, DC therapy Time For Goal Achievement: 11/16/17 Potential to Achieve Goals: Good    Frequency     Barriers to discharge        Co-evaluation               AM-PAC PT "6 Clicks" Daily Activity  Outcome Measure Difficulty turning over in bed (including adjusting bedclothes, sheets and blankets)?: None Difficulty moving from lying on back to sitting on the side of the bed? : Unable Difficulty sitting down on and standing up from a chair with arms (e.g., wheelchair, bedside commode, etc,.)?: A Little Help needed moving to and from a bed to chair (including a wheelchair)?: None Help needed walking in hospital room?: None Help needed climbing 3-5 steps with a railing? : None 6 Click Score: 20    End of Session Equipment Utilized During Treatment: Gait belt Activity Tolerance: Patient tolerated treatment well Patient left: in bed;with bed alarm set Nurse Communication: Mobility status PT Visit Diagnosis: Muscle weakness (generalized) (M62.81)    Time: 0626-9485 PT Time Calculation (min) (ACUTE ONLY): 10 min   Charges:   PT Evaluation $PT Eval Low Complexity: 1 Low     PT G Codes:   PT G-Codes **NOT FOR INPATIENT CLASS** Functional Assessment Tool Used: AM-PAC 6 Clicks Basic Mobility Functional Limitation: Mobility: Walking and moving around Mobility: Walking and Moving Around Current Status (I6270): At least 20 percent but less than 40 percent impaired, limited or restricted Mobility: Walking and Moving Around Goal Status [phone]): At least 20 percent but less than 40 percent impaired, limited or restricted Mobility: Walking and Moving Around Discharge Status [phone]): At least 20 percent but less than 40 percent impaired, limited or restricted     Greggory Stallion, PT, DPT [phone]   Lasalle Abee 11/16/2017, 2:33 PM

## 2017-11-16 NOTE — Progress Notes (Signed)
Patient ambulated around nurses station twice and oxygen saturations remained >90% on room air. Will continue to assess and monitor.

## 2017-11-16 NOTE — Progress Notes (Signed)
[place] [facility] [place] NAME: Nathaniel Ibarra    MR#:  [id number]  DATE OF BIRTH:  02-19-1941  SUBJECTIVE:  CHIEF COMPLAINT:   Chief Complaint  Patient presents with  . Irregular Heart Beat  feels tired, HR 126 REVIEW OF SYSTEMS:  Review of Systems  Constitutional: Positive for malaise/fatigue. Negative for diaphoresis, fever and weight loss.  HENT: Negative for ear discharge, ear pain, hearing loss, nosebleeds, sore throat and tinnitus.   Eyes: Negative for blurred vision and pain.  Respiratory: Positive for shortness of breath. Negative for cough, hemoptysis and wheezing.   Cardiovascular: Positive for chest pain and palpitations. Negative for orthopnea and leg swelling.  Gastrointestinal: Negative for abdominal pain, blood in stool, constipation, diarrhea, heartburn, nausea and vomiting.  Genitourinary: Negative for dysuria, frequency and urgency.  Musculoskeletal: Negative for back pain and myalgias.  Skin: Negative for itching and rash.  Neurological: Positive for weakness. Negative for dizziness, tingling, tremors, focal weakness, seizures and headaches.  Psychiatric/Behavioral: Negative for depression. The patient is not nervous/anxious.    DRUG ALLERGIES:  No Known Allergies VITALS:  Blood pressure 120/84, pulse (!) 126, temperature (!) 97.5 F (36.4 C), temperature source Oral, resp. rate 16, height 6' (1.829 m), weight 88.5 kg (195 lb), SpO2 93 %. PHYSICAL EXAMINATION:  Physical Exam  Constitutional: He is oriented to person, place, and time and well-developed, well-nourished, and in no distress.  HENT:  Head: Normocephalic and atraumatic.  Eyes: Conjunctivae and EOM are normal. Pupils are equal, round, and reactive to light.  Neck: Normal range of motion. Neck supple. No tracheal deviation present. No thyromegaly present.  Cardiovascular: Normal rate, regular rhythm and normal heart sounds.  Pulmonary/Chest: Effort normal  and breath sounds normal. No respiratory distress. He has no wheezes. He exhibits no tenderness.  Abdominal: Soft. Bowel sounds are normal. He exhibits no distension. There is no tenderness.  Musculoskeletal: Normal range of motion.  Neurological: He is alert and oriented to person, place, and time. No cranial nerve deficit.  Skin: Skin is warm and dry. No rash noted.  Psychiatric: Mood and affect normal.   LABORATORY PANEL:  Male CBC Recent Labs  Lab 11/16/17 0448  WBC 8.8  HGB 14.1  HCT 40.4  PLT 316   ------------------------------------------------------------------------------------------------------------------ Chemistries  Recent Labs  Lab 11/13/17 0645  11/16/17 0448  NA 138   < > 138  K 3.9   < > 3.5  CL 106   < > 102  CO2 25   < > 29  GLUCOSE 111*   < > 168*  BUN 22*   < > 23*  CREATININE 0.94   < > 0.90  CALCIUM 8.1*   < > 8.4*  AST 16  --   --   ALT 18  --   --   ALKPHOS 65  --   --   BILITOT 0.7  --   --    < > = values in this interval not displayed.   RADIOLOGY:  No results found. ASSESSMENT AND PLAN:  76 y.o. male has a past medical history significant for CAD, HTN, thyroid disease now with weakness and SOB with some chest discomfort. 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 Management plans discussed with the patient, nursing and they are in agreement.  CODE STATUS: Full Code  TOTAL TIME TAKING CARE OF THIS PATIENT: 25 minutes.   More than 50% of the time was spent in counseling/coordination of care: YES  POSSIBLE D/C IN 1 DAYS, DEPENDING ON CLINICAL CONDITION.   Max Sane M.D on 11/16/2017 at 3:09 PM  Between 7am to 6pm - Pager - [phone]  After 6pm go to [email or web address] - Proofreader  [facility]  Office  [phone]  CC: Primary care physician; Albina Billet, MD  Note: This dictation was prepared with Dragon dictation along with smaller phrase technology. Any transcriptional errors that result from this process are unintentional.

## 2017-11-16 NOTE — Progress Notes (Signed)
SATURATION QUALIFICATIONS: (This note is used to comply with regulatory documentation for home oxygen)  Patient Saturations on Room Air at Rest = 93%  Patient Saturations on Room Air while Ambulating = 94%  Patient Saturations on n/a Liters of oxygen while Ambulating = n/a  Please briefly explain why patient needs home oxygen:

## 2017-11-16 NOTE — Progress Notes (Addendum)
?  Patient admitted with dx of Afib RVR, chest pain, pulmonary edema, elevated troponin.  76 year old male with past medical history of CAD, HTN, hypothyroidism.  Patient underwent cardiac cath on 11/14/2017 which revealed no significant restenosis of stent and mild CAD.  Elevated troponin was felt to be due to demand ischemia in the setting of Afib RVR and Acute CHF.   Echo performed on 11/15/2017 which revealed an EF of 45%.  Note:  Echo performed 08/17/2015 patient's EF was 55%.    Educational session completed with patient and wife.   ? Provided patient with "Living Better with Heart Failure" packet. Briefly reviewed definition of heart failure and signs and symptoms of an exacerbation.  ? *Reviewed importance of and reason behind checking weight daily in the AM, after using the bathroom, but before getting dressed.  Patient has scales.   ? Reviewed the following information with patient:  *Discussed when to call the Dr= weight gain of >3lb overnight of 5lb in a week,  *Discussed yellow zone= call MD: weight gain of >3lb overnight of 5lb in a week, increased swelling, increased SOB when lying down, chest discomfort, dizziness, increased fatigue *Red Zone= call 911: struggle to breath, fainting or near fainting, significant chest pain  ? *Reviewed low sodium diet-provided handout of recommended and not recommended foods.  Wife stated patient does not add salt to his food, but he likes foods that have a lot of sodium, such as ketchup, mustard, chips, etc.  Wife looked at patient and said, "You are going to have to do this!"   Wife stated she is health conscious and would have to experiment with herbs and seasonings, as she has not used herbs and different seasonings in the past when cooking.   Dietitian consultation entered for diet education.   ? *Instructed patient to take medications as prescribed for heart failure. Explained briefly why pt is on the medications (either make you feel better, live  longer or keep you out of the hospital) and discussed monitoring and side effects.  ? *Discussed exercise. Wife stated that patient has not been as active recently cause patient has just not felt well.  Wife also stated Dr. Humphrey Rolls believes patient has OSA.  Wife stated she had to move out of the bedroom a couple of years ago due to his snoring.  Per wife, Dr. Humphrey Rolls is planning to order an outpatient sleep study.  Explained to patient and wife all untreated OSA can put a strain on one's heart causing it to work harder and cause possible arrhythmias.  Wife informed this RN that she thought her husband was depressed, although patient has not been diagnosed with depression.    *Smoking Cessation - Patient is a nonsmoker.   ? Explained the role of the [facility].  Role of [facility] discussed. Heart Failure Clinic Appointment scheduled for November 27, 2017 at 3:20 p.m.   ? ? ? Roanna Epley, RN, BSN, [facility]  Cardiovascular & Pulmonary Nurse Navigator  Direct Line: [phone]  Department Phone #: [phone] Fax: [phone]  Email Address: [email or web address]

## 2017-11-16 NOTE — Care Management (Addendum)
Patient has not qualified for continuous home oxygen as of this day.  Reached out to attending regarding overnight oximetry on room air to determine if patient will require nocturnal oxygen.  Provided patient with Eliquis coupon. Verbally confirms has medication coverage with his insurance.  Discussed may benefit from hone health nurse follow up and physical therapy consult is pending.  Discussed there are copays with his [employer] Plan.  [facility] referral

## 2017-11-16 NOTE — Progress Notes (Signed)
Placed on overnight oxim on RA. Sats 92%.

## 2017-11-16 NOTE — Progress Notes (Signed)
Patients bed alarm was activated, RN to room and upon assessment patient moving from bed to recliner and seemed very SOB, oxygen saturations 88% on room air, patient was placed on 2L nasal cannula. Will continue to monitor.

## 2017-11-17 LAB — CBC
HEMATOCRIT: 41.3 % (ref 40.0–52.0)
Hemoglobin: 13.9 g/dL (ref 13.0–18.0)
MCH: 31.6 pg (ref 26.0–34.0)
MCHC: 33.7 g/dL (ref 32.0–36.0)
MCV: 93.6 fL (ref 80.0–100.0)
Platelets: 343 10*3/uL (ref 150–440)
RBC: 4.41 MIL/uL (ref 4.40–5.90)
RDW: 12.5 % (ref 11.5–14.5)
WBC: 7.9 10*3/uL (ref 3.8–10.6)

## 2017-11-17 LAB — BASIC METABOLIC PANEL
ANION GAP: 9 (ref 5–15)
BUN: 24 mg/dL — ABNORMAL HIGH (ref 6–20)
CHLORIDE: 103 mmol/L (ref 101–111)
CO2: 27 mmol/L (ref 22–32)
CREATININE: 1.07 mg/dL (ref 0.61–1.24)
Calcium: 8.5 mg/dL — ABNORMAL LOW (ref 8.9–10.3)
GFR calc non Af Amer: >60 mL/min (ref >60)
Glucose, Bld: 184 mg/dL — ABNORMAL HIGH (ref 65–99)
POTASSIUM: 3.6 mmol/L (ref 3.5–5.1)
SODIUM: 139 mmol/L (ref 135–145)

## 2017-11-17 LAB — GLUCOSE, CAPILLARY: GLUCOSE-CAPILLARY: 92 mg/dL (ref 65–99)

## 2017-11-17 MED ORDER — TEMAZEPAM 7.5 MG PO CAPS: 7.5000 mg | ORAL_CAPSULE | Freq: Every evening | ORAL | 0 refills | Status: DC | PRN

## 2017-11-17 MED ORDER — LEVOTHYROXINE SODIUM 75 MCG PO TABS: 75.0000 ug | ORAL_TABLET | ORAL | 0 refills | Status: DC

## 2017-11-17 MED ORDER — APIXABAN 5 MG PO TABS: 5.0000 mg | ORAL_TABLET | Freq: Two times a day (BID) | ORAL | 0 refills | Status: DC

## 2017-11-17 MED ORDER — METOPROLOL SUCCINATE ER 25 MG PO TB24: 25.0000 mg | ORAL_TABLET | Freq: Every day | ORAL | 0 refills | Status: DC

## 2017-11-17 MED ORDER — KETOTIFEN FUMARATE 0.025 % OP SOLN: 1.0000 [drp] | Freq: Two times a day (BID) | OPHTHALMIC | 0 refills | Status: DC

## 2017-11-17 NOTE — Progress Notes (Signed)
Nutrition Education Note  RD consulted for nutrition education regarding a Heart Healthy diet.   Lipid Panel     Component Value Date/Time   CHOL 158 08/17/2015 0105   TRIG 188 (H) 08/17/2015 0105   HDL 28 (L) 08/17/2015 0105   CHOLHDL 5.6 08/17/2015 0105   VLDL 38 08/17/2015 0105   LDLCALC 92 08/17/2015 0105    RD provided "Heart Healthy Nutrition Therapy" handout from the Academy of Nutrition and Dietetics. Reviewed patient's dietary recall. Provided examples on ways to decrease sodium and fat intake in diet. Discouraged intake of processed foods and use of salt shaker. Encouraged fresh fruits and vegetables as well as whole grain sources of carbohydrates to maximize fiber intake. Teach back method used.  Expect fair compliance.  Body mass index is 26.26 kg/m. Pt meets criteria for normal weight based on current BMI.  Current diet order is 2 gram sodium, patient is consuming approximately 100% of meals at this time. Labs and medications reviewed. No further nutrition interventions warranted at this time. RD contact information provided. If additional nutrition issues arise, please re-consult RD.  Koleen Distance MS, RD, LDN Pager #- [phone] After Hours Pager: [phone]

## 2017-11-17 NOTE — Progress Notes (Signed)
SUBJECTIVE: Patient is feeling much better denies any chest pain or shortness of breath   Vitals:   11/16/17 2338 11/17/17 0415 11/17/17 0423 11/17/17 0740  BP: 140/67  128/73 (!) 157/75  Pulse: 61  64 68  Resp:    17  Temp:   (!) 97.5 F (36.4 C) 97.6 F (36.4 C)  TempSrc:   Oral Oral  SpO2:   94% 93%  Weight:  193 lb 9.6 oz (87.8 kg)    Height:        Intake/Output Summary (Last 24 hours) at 11/17/2017 0847 Last data filed at 11/16/2017 2304 Gross per 24 hour  Intake 600 ml  Output 1825 ml  Net -1225 ml    LABS: Basic Metabolic Panel: Recent Labs    11/16/17 0448 11/17/17 0354  NA 138 139  K 3.5 3.6  CL 102 103  CO2 29 27  GLUCOSE 168* 184*  BUN 23* 24*  CREATININE 0.90 1.07  CALCIUM 8.4* 8.5*   Liver Function Tests: No results for input(s): AST, ALT, ALKPHOS, BILITOT, PROT, ALBUMIN in the last 72 hours. No results for input(s): LIPASE, AMYLASE in the last 72 hours. CBC: Recent Labs    11/16/17 0448 11/17/17 0354  WBC 8.8 7.9  HGB 14.1 13.9  HCT 40.4 41.3  MCV 92.2 93.6  PLT 316 343   Cardiac Enzymes: No results for input(s): CKTOTAL, CKMB, CKMBINDEX, TROPONINI in the last 72 hours. BNP: Invalid input(s): POCBNP D-Dimer: No results for input(s): DDIMER in the last 72 hours. Hemoglobin A1C: No results for input(s): HGBA1C in the last 72 hours. Fasting Lipid Panel: No results for input(s): CHOL, HDL, LDLCALC, TRIG, CHOLHDL, LDLDIRECT in the last 72 hours. Thyroid Function Tests: No results for input(s): TSH, T4TOTAL, T3FREE, THYROIDAB in the last 72 hours.  Invalid input(s): FREET3 Anemia Panel: No results for input(s): VITAMINB12, FOLATE, FERRITIN, TIBC, IRON, RETICCTPCT in the last 72 hours.   PHYSICAL EXAM General: Well developed, well nourished, in no acute distress HEENT:  Normocephalic and atramatic Neck:  No JVD.  Lungs: Clear bilaterally to auscultation and percussion. Heart: HRRR . Normal S1 and S2 without gallops or murmurs.   Abdomen: Bowel sounds are positive, abdomen soft and non-tender  Msk:  Back normal, normal gait. Normal strength and tone for age. Extremities: No clubbing, cyanosis or edema.   Neuro: Alert and oriented X 3. Psych:  Good affect, responds appropriately  TELEMETRY: Atrial fibrillation with controlled ventricular rate  ASSESSMENT AND PLAN: Atrial fibrillation with controlled ventricular rate and possible sleep apnea. Patient is already set up for sleep study next Thursday at [facility]. If possible advise getting CPAP set up at home pending sleep study since patient already has documented sleep apnea and the hospital. Patient will be followed up 11 AM on Monday in my office. Patient can be discharged home on current medication.  Principal Problem:   Atrial fibrillation with RVR (HCC) Active Problems:   Chest pain   Pulmonary edema cardiac cause (HCC)   Elevated troponin   CHF (congestive heart failure) (HCC)    Larayah Clute A, MD, [facility] 11/17/2017 8:47 AM

## 2017-11-17 NOTE — Care Management (Addendum)
Patient had overnight oximetry.  It appears it qualifies patient for nocturnal oxygen but patient also found to have exertional room air sats which would have qualified him for continuous oxygen.  Cardiology has scheduled patient for sleep study for 11/23/2017.  He stated that if at all possible patient would greatly benefit from having a cpap prior to this.  Spoke with patient and his wife.  DME agency preference will be [facility] as agency is in network with [employer].  Continue to decline home health nurse but is open to consider [facility]. Have placed call to [facility] to discuss oxygen needs. Hypoxia has not resolved by using IV diuretics and changes in cardiac meds

## 2017-11-17 NOTE — Care Management Important Message (Signed)
Important Message  Patient Details  Name: Nathaniel Ibarra MRN: [id number] Date of Birth: July 12, 1941   Medicare Important Message Given:  Yes Signed IM notice given    Katrina Stack, RN 11/17/2017, 8:35 AM

## 2017-11-17 NOTE — Progress Notes (Signed)
Over night oxim complete. Removed oximeter.

## 2017-11-17 NOTE — Progress Notes (Signed)
Discharge instructions given. Wife at bedside. Pt and wife verbalized understanding with questions answered.

## 2017-11-17 NOTE — Progress Notes (Signed)
SATURATION QUALIFICATIONS: (This note is used to comply with regulatory documentation for home oxygen)  Patient Saturations on Room Air at Rest = 93%  Patient Saturations on Room Air while Ambulating = 90-95%  Patient Saturations on  Liters of oxygen while Ambulating = %  Please briefly explain why patient needs home oxygen: Pt ambulated 2 times around the nurses station. Lowest O2 sat was 90% but it quickly went back up to 93-95%.

## 2017-11-17 NOTE — Discharge Instructions (Signed)
Heart Failure Clinic appointment on November 27 2017 at 3:20pm with Nathaniel Ibarra, [place]. Please call [phone] to reschedule.     Atrial Fibrillation Atrial fibrillation is a type of heartbeat that is irregular or fast (rapid). If you have this condition, your heart keeps quivering in a weird (chaotic) way. This condition can make it so your heart cannot pump blood normally. Having this condition gives a person more risk for stroke, heart failure, and other heart problems. There are different types of atrial fibrillation. Talk with your doctor to learn about the type that you have. Follow these instructions at home:  Take over-the-counter and prescription medicines only as told by your doctor.  If your doctor prescribed a blood-thinning medicine, take it exactly as told. Taking too much of it can cause bleeding. If you do not take enough of it, you will not have the protection that you need against stroke and other problems.  Do not use any tobacco products. These include cigarettes, chewing tobacco, and e-cigarettes. If you need help quitting, ask your doctor.  If you have apnea (obstructive sleep apnea), manage it as told by your doctor.  Do not drink alcohol.  Do not drink beverages that have caffeine. These include coffee, soda, and tea.  Maintain a healthy weight. Do not use diet pills unless your doctor says they are safe for you. Diet pills may make heart problems worse.  Follow diet instructions as told by your doctor.  Exercise regularly as told by your doctor.  Keep all follow-up visits as told by your doctor. This is important. Contact a doctor if:  You notice a change in the speed, rhythm, or strength of your heartbeat.  You are taking a blood-thinning medicine and you notice more bruising.  You get tired more easily when you move or exercise. Get help right away if:  You have pain in your chest or your belly (abdomen).  You have sweating or weakness.  You feel  sick to your stomach (nauseous).  You notice blood in your throw up (vomit), poop (stool), or pee (urine).  You are short of breath.  You suddenly have swollen feet and ankles.  You feel dizzy.  Your suddenly get weak or numb in your face, arms, or legs, especially if it happens on one side of your body.  You have trouble talking, trouble understanding, or both.  Your face or your eyelid droops on one side. These symptoms may be an emergency. Do not wait to see if the symptoms will go away. Get medical help right away. Call your local emergency services (911 in the [place] Do not drive yourself to the hospital. This information is not intended to replace advice given to you by your health care provider. Make sure you discuss any questions you have with your health care provider. Document Released: 09/13/2008 Document Revised: 05/12/2016 Document Reviewed: 04/01/2015 Elsevier Interactive Patient Education  [employer].

## 2017-11-17 NOTE — Progress Notes (Signed)
ANTICOAGULATION CONSULT NOTE - Initial Consult  Pharmacy Consult for Apixaban Indication: nonvalvular atrial fibrillation  No Known Allergies  Patient Measurements: Height: 6' (182.9 cm) Weight: 193 lb 9.6 oz (87.8 kg) IBW/kg (Calculated) : 77.6   Vital Signs: Temp: 97.6 F (36.4 C) (11/30 0740) Temp Source: Oral (11/30 0740) BP: 143/65 (11/30 1042) Pulse Rate: 63 (11/30 1042)  Labs: Recent Labs    11/16/17 0448 11/17/17 0354  HGB 14.1 13.9  HCT 40.4 41.3  PLT 316 343  CREATININE 0.90 1.07    Estimated Creatinine Clearance: 65.5 mL/min (by C-G formula based on SCr of 1.07 mg/dL).   Medical History: Past Medical History:  Diagnosis Date  . Bladder cancer ([place])   . BPH (benign prostatic hyperplasia)   . GERD (gastroesophageal reflux disease)   . Hypertension   . Myocardial infarction [facility])    cath with stent in 2016  . Thyroid disease     Medications:  Last lovenox dose of 40mg  on 11/26 @ 2133.   Assessment: 76 yo male admitted with uncontrolled a fib with RVR and status post cath. Pharmacy has been consulted for apixaban dosing.   Goal of Therapy:  Monitor platelets by anticoagulation protocol: Yes   Plan:  Will continue apixaban 5mg  BID.   Larene Beach, PharmD  11/17/2017,11:06 AM

## 2017-11-17 NOTE — Progress Notes (Signed)
Pt received Oxygen supplies. IV taken out, tele off. Pt transported via wheelchair.

## 2017-11-18 NOTE — Discharge Summary (Signed)
[place] [facility] [place] NAME: Nathaniel Ibarra    MR#:  [id number]  DATE OF BIRTH:  10-Oct-1941  DATE OF ADMISSION:  11/12/2017   ADMITTING PHYSICIAN: Idelle Crouch, MD  DATE OF DISCHARGE: 11/17/2017  5:42 PM  PRIMARY CARE PHYSICIAN: Albina Billet, MD   ADMISSION DIAGNOSIS:  Wide-complex tachycardia ([place]) [I47.2] Atrial fibrillation and flutter ([place]) [I48.91, I48.92] DISCHARGE DIAGNOSIS:  Principal Problem:   Atrial fibrillation with RVR (HCC) Active Problems:   Chest pain   Pulmonary edema cardiac cause (HCC)   Elevated troponin   CHF (congestive heart failure) ([place])  SECONDARY DIAGNOSIS:   Past Medical History:  Diagnosis Date  . Bladder cancer ([place])   . BPH (benign prostatic hyperplasia)   . GERD (gastroesophageal reflux disease)   . Hypertension   . Myocardial infarction [facility])    cath with stent in 2016  . Thyroid disease    HOSPITAL COURSE:  76 y.o.malehas a past medical history significant forCAD, HTN, thyroid disease now with weakness and SOB with some chest discomfort. Admitted for rapid A-fib   * Atrial fibrillation with RVR:  - Amio and Metoprolol for rate control -  Eliquis for anticoagulation  *Chest pain/ACS -Status post cardiac catheterization not showing any significant coronary disease -Continue aspirin  *Acute on chronic hypoxic respiratory failure -resolved, off BiPAP, sleep study as an outpt  * CHF: Acute on chronic Diastolic - last echo showed EF 55% Neg 3.3 liters  * Hypothyroidism - TSH 5.6, recheck  Came at 5.7 -increased the dose of Synthroid from 50->75 mcg  *Elevated troponin - due to supply demand ischemia  *Obstructive sleep apnea - sleep study as an outpatient next Thursday at Dr Laurelyn Sickle office - Overnight Oximetry qualified him for O2 as well DISCHARGE CONDITIONS:  stable CONSULTS OBTAINED:  Treatment Team:  Dionisio David, MD DRUG ALLERGIES:  No Known  Allergies DISCHARGE MEDICATIONS:   Allergies as of 11/17/2017   No Known Allergies     Medication List    TAKE these medications   amiodarone 200 MG tablet Commonly known as:  PACERONE Take 2 tablets (400 mg total) by mouth daily.   amLODipine 5 MG tablet Commonly known as:  NORVASC Take 5 mg by mouth every Monday, Wednesday, and Friday.   apixaban 5 MG Tabs tablet Commonly known as:  ELIQUIS Take 1 tablet (5 mg total) by mouth 2 (two) times daily.   aspirin 325 MG EC tablet Take 1 tablet (325 mg total) by mouth daily. What changed:  how much to take   buPROPion 150 MG 24 hr tablet Commonly known as:  WELLBUTRIN XL Take 150 mg by mouth daily.   clopidogrel 75 MG tablet Commonly known as:  PLAVIX Take 1 tablet (75 mg total) by mouth once.   finasteride 5 MG tablet Commonly known as:  PROSCAR Take 5 mg by mouth daily.   ibuprofen 200 MG tablet Commonly known as:  MOTRIN IB Take 1 tablet (200 mg total) by mouth every 6 (six) hours as needed.   ketotifen 0.025 % ophthalmic solution Commonly known as:  ZADITOR Place 1 drop into both eyes 2 (two) times daily.   levothyroxine 75 MCG tablet Commonly known as:  SYNTHROID, LEVOTHROID Take 1 tablet (75 mcg total) by mouth every morning. What changed:    medication strength  how much to take   metoprolol succinate 25 MG 24 hr tablet Commonly known as:  TOPROL-XL Take 1 tablet (  25 mg total) by mouth daily.   pantoprazole 40 MG tablet Commonly known as:  PROTONIX Take 40 mg by mouth daily as needed.   tamsulosin 0.4 MG Caps capsule Commonly known as:  FLOMAX Take 0.4 mg by mouth 2 (two) times daily.   temazepam 7.5 MG capsule Commonly known as:  RESTORIL Take 1 capsule (7.5 mg total) by mouth at bedtime as needed for sleep.        DISCHARGE INSTRUCTIONS:   DIET:  Cardiac diet DISCHARGE CONDITION:  Good ACTIVITY:  Activity as tolerated OXYGEN:  Home Oxygen: Yes.    Oxygen Delivery: 2 liters via  [place] DISCHARGE LOCATION:  home   If you experience worsening of your admission symptoms, develop shortness of breath, life threatening emergency, suicidal or homicidal thoughts you must seek medical attention immediately by calling 911 or calling your MD immediately  if symptoms less severe.  You Must read complete instructions/literature along with all the possible adverse reactions/side effects for all the Medicines you take and that have been prescribed to you. Take any new Medicines after you have completely understood and accpet all the possible adverse reactions/side effects.   Please note  You were cared for by a hospitalist during your hospital stay. If you have any questions about your discharge medications or the care you received while you were in the hospital after you are discharged, you can call the unit and asked to speak with the hospitalist on call if the hospitalist that took care of you is not available. Once you are discharged, your primary care physician will handle any further medical issues. Please note that NO REFILLS for any discharge medications will be authorized once you are discharged, as it is imperative that you return to your primary care physician (or establish a relationship with a primary care physician if you do not have one) for your aftercare needs so that they can reassess your need for medications and monitor your lab values.    On the day of Discharge:  VITAL SIGNS:  Blood pressure (!) 143/65, pulse 63, temperature 97.6 F (36.4 C), temperature source Oral, resp. rate 18, height 6' (1.829 m), weight 87.8 kg (193 lb 9.6 oz), SpO2 90 %. PHYSICAL EXAMINATION:  GENERAL:  76 y.o.-year-old patient lying in the bed with no acute distress.  EYES: Pupils equal, round, reactive to light and accommodation. No scleral icterus. Extraocular muscles intact.  HEENT: Head atraumatic, normocephalic. Oropharynx and nasopharynx clear.  NECK:  Supple, no jugular venous  distention. No thyroid enlargement, no tenderness.  LUNGS: Normal breath sounds bilaterally, no wheezing, rales,rhonchi or crepitation. No use of accessory muscles of respiration.  CARDIOVASCULAR: S1, S2 normal. No murmurs, rubs, or gallops.  ABDOMEN: Soft, non-tender, non-distended. Bowel sounds present. No organomegaly or mass.  EXTREMITIES: No pedal edema, cyanosis, or clubbing.  NEUROLOGIC: Cranial nerves II through XII are intact. Muscle strength 5/5 in all extremities. Sensation intact. Gait not checked.  PSYCHIATRIC: The patient is alert and oriented x 3.  SKIN: No obvious rash, lesion, or ulcer.  DATA REVIEW:   CBC Recent Labs  Lab 11/17/17 0354  WBC 7.9  HGB 13.9  HCT 41.3  PLT 343    Chemistries  Recent Labs  Lab 11/13/17 0645  11/17/17 0354  NA 138   < > 139  K 3.9   < > 3.6  CL 106   < > 103  CO2 25   < > 27  GLUCOSE 111*   < > 184*  BUN 22*   < > 24*  CREATININE 0.94   < > 1.07  CALCIUM 8.1*   < > 8.5*  AST 16  --   --   ALT 18  --   --   ALKPHOS 65  --   --   BILITOT 0.7  --   --    < > = values in this interval not displayed.     Follow-up Information    [facility] Follow up on 11/27/2017.   Specialty:  Cardiology Why:  at 3:20pm Contact information: [place] [address] [place] [phone]       Albina Billet, MD. Schedule an appointment as soon as possible for a visit on 11/29/2017.   Specialty:  Internal Medicine Why:  1:45pm Contact information: [address]   [place] [address] [phone]        Dionisio David, MD. Go on 11/23/2017.   Specialty:  Cardiology Why:  as scheduled for sleep study and coming Monday as per Dr Chalmers Cater information: [place] [id number] [phone]            Management plans discussed with the patient, family and they are in agreement.  CODE STATUS: Prior   TOTAL TIME TAKING CARE  OF THIS PATIENT: 45 minutes.    Max Sane M.D on 11/18/2017 at 11:01 AM  Between 7am to 6pm - Pager - [phone]  After 6pm go to [email or web address] - Proofreader  [facility]  Office  [phone]  CC: Primary care physician; Albina Billet, MD   Note: This dictation was prepared with Dragon dictation along with smaller phrase technology. Any transcriptional errors that result from this process are unintentional.

## 2017-11-20 DIAGNOSIS — I509 Heart failure, unspecified: Secondary | ICD-10-CM | POA: Diagnosis not present

## 2017-11-20 DIAGNOSIS — R0602 Shortness of breath: Secondary | ICD-10-CM | POA: Diagnosis not present

## 2017-11-20 DIAGNOSIS — I340 Nonrheumatic mitral (valve) insufficiency: Secondary | ICD-10-CM | POA: Diagnosis not present

## 2017-11-20 DIAGNOSIS — I2510 Atherosclerotic heart disease of native coronary artery without angina pectoris: Secondary | ICD-10-CM | POA: Diagnosis not present

## 2017-11-20 DIAGNOSIS — G4730 Sleep apnea, unspecified: Secondary | ICD-10-CM | POA: Diagnosis not present

## 2017-11-20 DIAGNOSIS — I5032 Chronic diastolic (congestive) heart failure: Secondary | ICD-10-CM | POA: Diagnosis not present

## 2017-11-20 DIAGNOSIS — R609 Edema, unspecified: Secondary | ICD-10-CM | POA: Diagnosis not present

## 2017-11-20 DIAGNOSIS — I4891 Unspecified atrial fibrillation: Secondary | ICD-10-CM | POA: Diagnosis not present

## 2017-11-21 DIAGNOSIS — E039 Hypothyroidism, unspecified: Secondary | ICD-10-CM | POA: Diagnosis not present

## 2017-11-21 DIAGNOSIS — I2510 Atherosclerotic heart disease of native coronary artery without angina pectoris: Secondary | ICD-10-CM | POA: Diagnosis not present

## 2017-11-21 DIAGNOSIS — I4891 Unspecified atrial fibrillation: Secondary | ICD-10-CM | POA: Diagnosis not present

## 2017-11-23 DIAGNOSIS — G4733 Obstructive sleep apnea (adult) (pediatric): Secondary | ICD-10-CM | POA: Diagnosis not present

## 2017-11-27 ENCOUNTER — Ambulatory Visit: Payer: PPO | Admitting: Family

## 2017-12-04 DIAGNOSIS — I4891 Unspecified atrial fibrillation: Secondary | ICD-10-CM | POA: Diagnosis not present

## 2017-12-04 DIAGNOSIS — R0602 Shortness of breath: Secondary | ICD-10-CM | POA: Diagnosis not present

## 2017-12-04 DIAGNOSIS — I10 Essential (primary) hypertension: Secondary | ICD-10-CM | POA: Diagnosis not present

## 2017-12-04 DIAGNOSIS — G4730 Sleep apnea, unspecified: Secondary | ICD-10-CM | POA: Diagnosis not present

## 2017-12-04 DIAGNOSIS — I340 Nonrheumatic mitral (valve) insufficiency: Secondary | ICD-10-CM | POA: Diagnosis not present

## 2017-12-07 ENCOUNTER — Ambulatory Visit: Payer: PPO | Admitting: Gastroenterology

## 2017-12-15 DIAGNOSIS — N3941 Urge incontinence: Secondary | ICD-10-CM | POA: Diagnosis not present

## 2017-12-15 DIAGNOSIS — I5020 Unspecified systolic (congestive) heart failure: Secondary | ICD-10-CM | POA: Diagnosis not present

## 2017-12-15 DIAGNOSIS — I482 Chronic atrial fibrillation: Secondary | ICD-10-CM | POA: Diagnosis not present

## 2017-12-15 DIAGNOSIS — C672 Malignant neoplasm of lateral wall of bladder: Secondary | ICD-10-CM | POA: Diagnosis not present

## 2017-12-15 DIAGNOSIS — N401 Enlarged prostate with lower urinary tract symptoms: Secondary | ICD-10-CM | POA: Diagnosis not present

## 2017-12-17 DIAGNOSIS — G4733 Obstructive sleep apnea (adult) (pediatric): Secondary | ICD-10-CM | POA: Diagnosis not present

## 2017-12-17 DIAGNOSIS — I5032 Chronic diastolic (congestive) heart failure: Secondary | ICD-10-CM | POA: Diagnosis not present

## 2017-12-17 DIAGNOSIS — R0602 Shortness of breath: Secondary | ICD-10-CM | POA: Diagnosis not present

## 2017-12-18 DIAGNOSIS — G4730 Sleep apnea, unspecified: Secondary | ICD-10-CM | POA: Diagnosis not present

## 2017-12-18 DIAGNOSIS — R079 Chest pain, unspecified: Secondary | ICD-10-CM | POA: Diagnosis not present

## 2017-12-18 DIAGNOSIS — I340 Nonrheumatic mitral (valve) insufficiency: Secondary | ICD-10-CM | POA: Diagnosis not present

## 2017-12-18 DIAGNOSIS — R0602 Shortness of breath: Secondary | ICD-10-CM | POA: Diagnosis not present

## 2017-12-18 DIAGNOSIS — I4891 Unspecified atrial fibrillation: Secondary | ICD-10-CM | POA: Diagnosis not present

## 2017-12-18 DIAGNOSIS — I10 Essential (primary) hypertension: Secondary | ICD-10-CM | POA: Diagnosis not present

## 2017-12-21 DIAGNOSIS — I5032 Chronic diastolic (congestive) heart failure: Secondary | ICD-10-CM | POA: Diagnosis not present

## 2017-12-21 DIAGNOSIS — R0602 Shortness of breath: Secondary | ICD-10-CM | POA: Diagnosis not present

## 2017-12-28 DIAGNOSIS — R7301 Impaired fasting glucose: Secondary | ICD-10-CM | POA: Diagnosis not present

## 2017-12-28 DIAGNOSIS — I2510 Atherosclerotic heart disease of native coronary artery without angina pectoris: Secondary | ICD-10-CM | POA: Diagnosis not present

## 2017-12-28 DIAGNOSIS — E039 Hypothyroidism, unspecified: Secondary | ICD-10-CM | POA: Diagnosis not present

## 2018-01-02 DIAGNOSIS — I10 Essential (primary) hypertension: Secondary | ICD-10-CM | POA: Diagnosis not present

## 2018-01-02 DIAGNOSIS — I2510 Atherosclerotic heart disease of native coronary artery without angina pectoris: Secondary | ICD-10-CM | POA: Diagnosis not present

## 2018-01-02 DIAGNOSIS — I340 Nonrheumatic mitral (valve) insufficiency: Secondary | ICD-10-CM | POA: Diagnosis not present

## 2018-01-02 DIAGNOSIS — I4891 Unspecified atrial fibrillation: Secondary | ICD-10-CM | POA: Diagnosis not present

## 2018-01-02 DIAGNOSIS — G4730 Sleep apnea, unspecified: Secondary | ICD-10-CM | POA: Diagnosis not present

## 2018-01-02 DIAGNOSIS — R0602 Shortness of breath: Secondary | ICD-10-CM | POA: Diagnosis not present

## 2018-01-02 DIAGNOSIS — E039 Hypothyroidism, unspecified: Secondary | ICD-10-CM | POA: Diagnosis not present

## 2018-01-17 DIAGNOSIS — G4733 Obstructive sleep apnea (adult) (pediatric): Secondary | ICD-10-CM | POA: Diagnosis not present

## 2018-01-17 DIAGNOSIS — I5032 Chronic diastolic (congestive) heart failure: Secondary | ICD-10-CM | POA: Diagnosis not present

## 2018-01-17 DIAGNOSIS — R0602 Shortness of breath: Secondary | ICD-10-CM | POA: Diagnosis not present

## 2018-01-21 DIAGNOSIS — I5032 Chronic diastolic (congestive) heart failure: Secondary | ICD-10-CM | POA: Diagnosis not present

## 2018-01-21 DIAGNOSIS — R0602 Shortness of breath: Secondary | ICD-10-CM | POA: Diagnosis not present

## 2018-02-09 DIAGNOSIS — I2510 Atherosclerotic heart disease of native coronary artery without angina pectoris: Secondary | ICD-10-CM | POA: Diagnosis not present

## 2018-02-09 DIAGNOSIS — I4891 Unspecified atrial fibrillation: Secondary | ICD-10-CM | POA: Diagnosis not present

## 2018-02-09 DIAGNOSIS — I340 Nonrheumatic mitral (valve) insufficiency: Secondary | ICD-10-CM | POA: Diagnosis not present

## 2018-02-09 DIAGNOSIS — G4730 Sleep apnea, unspecified: Secondary | ICD-10-CM | POA: Diagnosis not present

## 2018-02-09 DIAGNOSIS — I10 Essential (primary) hypertension: Secondary | ICD-10-CM | POA: Diagnosis not present

## 2018-02-09 DIAGNOSIS — R0602 Shortness of breath: Secondary | ICD-10-CM | POA: Diagnosis not present

## 2018-02-12 DIAGNOSIS — I10 Essential (primary) hypertension: Secondary | ICD-10-CM | POA: Diagnosis not present

## 2018-02-12 DIAGNOSIS — I340 Nonrheumatic mitral (valve) insufficiency: Secondary | ICD-10-CM | POA: Diagnosis not present

## 2018-02-12 DIAGNOSIS — I4891 Unspecified atrial fibrillation: Secondary | ICD-10-CM | POA: Diagnosis not present

## 2018-02-12 DIAGNOSIS — R0602 Shortness of breath: Secondary | ICD-10-CM | POA: Diagnosis not present

## 2018-02-12 DIAGNOSIS — G4730 Sleep apnea, unspecified: Secondary | ICD-10-CM | POA: Diagnosis not present

## 2018-02-15 DIAGNOSIS — I10 Essential (primary) hypertension: Secondary | ICD-10-CM | POA: Diagnosis not present

## 2018-02-15 DIAGNOSIS — R0602 Shortness of breath: Secondary | ICD-10-CM | POA: Diagnosis not present

## 2018-02-15 DIAGNOSIS — I2510 Atherosclerotic heart disease of native coronary artery without angina pectoris: Secondary | ICD-10-CM | POA: Diagnosis not present

## 2018-02-15 DIAGNOSIS — I5032 Chronic diastolic (congestive) heart failure: Secondary | ICD-10-CM | POA: Diagnosis not present

## 2018-02-15 DIAGNOSIS — G4730 Sleep apnea, unspecified: Secondary | ICD-10-CM | POA: Diagnosis not present

## 2018-02-15 DIAGNOSIS — I4891 Unspecified atrial fibrillation: Secondary | ICD-10-CM | POA: Diagnosis not present

## 2018-02-15 DIAGNOSIS — I340 Nonrheumatic mitral (valve) insufficiency: Secondary | ICD-10-CM | POA: Diagnosis not present

## 2018-02-15 DIAGNOSIS — G4733 Obstructive sleep apnea (adult) (pediatric): Secondary | ICD-10-CM | POA: Diagnosis not present

## 2018-02-16 DIAGNOSIS — R0602 Shortness of breath: Secondary | ICD-10-CM | POA: Diagnosis not present

## 2018-02-16 DIAGNOSIS — G4730 Sleep apnea, unspecified: Secondary | ICD-10-CM | POA: Diagnosis not present

## 2018-02-16 DIAGNOSIS — I10 Essential (primary) hypertension: Secondary | ICD-10-CM | POA: Diagnosis not present

## 2018-02-16 DIAGNOSIS — I2510 Atherosclerotic heart disease of native coronary artery without angina pectoris: Secondary | ICD-10-CM | POA: Diagnosis not present

## 2018-02-16 DIAGNOSIS — I4891 Unspecified atrial fibrillation: Secondary | ICD-10-CM | POA: Diagnosis not present

## 2018-02-16 DIAGNOSIS — I340 Nonrheumatic mitral (valve) insufficiency: Secondary | ICD-10-CM | POA: Diagnosis not present

## 2018-02-18 DIAGNOSIS — I5032 Chronic diastolic (congestive) heart failure: Secondary | ICD-10-CM | POA: Diagnosis not present

## 2018-02-18 DIAGNOSIS — R0602 Shortness of breath: Secondary | ICD-10-CM | POA: Diagnosis not present

## 2018-03-02 DIAGNOSIS — I4891 Unspecified atrial fibrillation: Secondary | ICD-10-CM | POA: Diagnosis not present

## 2018-03-02 DIAGNOSIS — I340 Nonrheumatic mitral (valve) insufficiency: Secondary | ICD-10-CM | POA: Diagnosis not present

## 2018-03-02 DIAGNOSIS — I10 Essential (primary) hypertension: Secondary | ICD-10-CM | POA: Diagnosis not present

## 2018-03-02 DIAGNOSIS — R0602 Shortness of breath: Secondary | ICD-10-CM | POA: Diagnosis not present

## 2018-03-02 DIAGNOSIS — G4730 Sleep apnea, unspecified: Secondary | ICD-10-CM | POA: Diagnosis not present

## 2018-03-17 DIAGNOSIS — R0602 Shortness of breath: Secondary | ICD-10-CM | POA: Diagnosis not present

## 2018-03-17 DIAGNOSIS — G4733 Obstructive sleep apnea (adult) (pediatric): Secondary | ICD-10-CM | POA: Diagnosis not present

## 2018-03-17 DIAGNOSIS — I5032 Chronic diastolic (congestive) heart failure: Secondary | ICD-10-CM | POA: Diagnosis not present

## 2018-03-21 DIAGNOSIS — R0602 Shortness of breath: Secondary | ICD-10-CM | POA: Diagnosis not present

## 2018-03-21 DIAGNOSIS — I5032 Chronic diastolic (congestive) heart failure: Secondary | ICD-10-CM | POA: Diagnosis not present

## 2018-03-27 DIAGNOSIS — R001 Bradycardia, unspecified: Secondary | ICD-10-CM | POA: Diagnosis not present

## 2018-04-17 DIAGNOSIS — G4733 Obstructive sleep apnea (adult) (pediatric): Secondary | ICD-10-CM | POA: Diagnosis not present

## 2018-04-17 DIAGNOSIS — R0602 Shortness of breath: Secondary | ICD-10-CM | POA: Diagnosis not present

## 2018-04-17 DIAGNOSIS — I5032 Chronic diastolic (congestive) heart failure: Secondary | ICD-10-CM | POA: Diagnosis not present

## 2018-04-20 DIAGNOSIS — R0602 Shortness of breath: Secondary | ICD-10-CM | POA: Diagnosis not present

## 2018-04-20 DIAGNOSIS — I5032 Chronic diastolic (congestive) heart failure: Secondary | ICD-10-CM | POA: Diagnosis not present

## 2018-05-08 DIAGNOSIS — R001 Bradycardia, unspecified: Secondary | ICD-10-CM | POA: Insufficient documentation

## 2018-05-08 DIAGNOSIS — F4321 Adjustment disorder with depressed mood: Secondary | ICD-10-CM | POA: Insufficient documentation

## 2018-05-17 DIAGNOSIS — I5032 Chronic diastolic (congestive) heart failure: Secondary | ICD-10-CM | POA: Diagnosis not present

## 2018-05-17 DIAGNOSIS — R0602 Shortness of breath: Secondary | ICD-10-CM | POA: Diagnosis not present

## 2018-05-17 DIAGNOSIS — G4733 Obstructive sleep apnea (adult) (pediatric): Secondary | ICD-10-CM | POA: Diagnosis not present

## 2018-06-11 DIAGNOSIS — N3941 Urge incontinence: Secondary | ICD-10-CM | POA: Diagnosis not present

## 2018-06-11 DIAGNOSIS — N401 Enlarged prostate with lower urinary tract symptoms: Secondary | ICD-10-CM | POA: Diagnosis not present

## 2018-06-11 DIAGNOSIS — R351 Nocturia: Secondary | ICD-10-CM | POA: Diagnosis not present

## 2018-06-17 DIAGNOSIS — G4733 Obstructive sleep apnea (adult) (pediatric): Secondary | ICD-10-CM | POA: Diagnosis not present

## 2018-06-17 DIAGNOSIS — I5032 Chronic diastolic (congestive) heart failure: Secondary | ICD-10-CM | POA: Diagnosis not present

## 2018-06-17 DIAGNOSIS — R0602 Shortness of breath: Secondary | ICD-10-CM | POA: Diagnosis not present

## 2018-07-17 DIAGNOSIS — G4733 Obstructive sleep apnea (adult) (pediatric): Secondary | ICD-10-CM | POA: Diagnosis not present

## 2018-07-17 DIAGNOSIS — R0602 Shortness of breath: Secondary | ICD-10-CM | POA: Diagnosis not present

## 2018-07-17 DIAGNOSIS — I5032 Chronic diastolic (congestive) heart failure: Secondary | ICD-10-CM | POA: Diagnosis not present

## 2018-07-23 DIAGNOSIS — F321 Major depressive disorder, single episode, moderate: Secondary | ICD-10-CM | POA: Diagnosis not present

## 2018-07-23 DIAGNOSIS — J209 Acute bronchitis, unspecified: Secondary | ICD-10-CM | POA: Diagnosis not present

## 2018-07-23 DIAGNOSIS — E039 Hypothyroidism, unspecified: Secondary | ICD-10-CM | POA: Diagnosis not present

## 2018-07-23 DIAGNOSIS — I4891 Unspecified atrial fibrillation: Secondary | ICD-10-CM | POA: Diagnosis not present

## 2018-07-23 DIAGNOSIS — I2510 Atherosclerotic heart disease of native coronary artery without angina pectoris: Secondary | ICD-10-CM | POA: Diagnosis not present

## 2018-07-30 DIAGNOSIS — J209 Acute bronchitis, unspecified: Secondary | ICD-10-CM | POA: Diagnosis not present

## 2018-07-30 DIAGNOSIS — I2510 Atherosclerotic heart disease of native coronary artery without angina pectoris: Secondary | ICD-10-CM | POA: Diagnosis not present

## 2018-07-30 DIAGNOSIS — I4891 Unspecified atrial fibrillation: Secondary | ICD-10-CM | POA: Diagnosis not present

## 2018-07-30 DIAGNOSIS — F321 Major depressive disorder, single episode, moderate: Secondary | ICD-10-CM | POA: Diagnosis not present

## 2018-07-30 DIAGNOSIS — G4700 Insomnia, unspecified: Secondary | ICD-10-CM | POA: Diagnosis not present

## 2018-07-30 DIAGNOSIS — Z1211 Encounter for screening for malignant neoplasm of colon: Secondary | ICD-10-CM | POA: Diagnosis not present

## 2018-07-30 DIAGNOSIS — E039 Hypothyroidism, unspecified: Secondary | ICD-10-CM | POA: Diagnosis not present

## 2018-07-31 DIAGNOSIS — F321 Major depressive disorder, single episode, moderate: Secondary | ICD-10-CM | POA: Diagnosis not present

## 2018-07-31 DIAGNOSIS — I4891 Unspecified atrial fibrillation: Secondary | ICD-10-CM | POA: Diagnosis not present

## 2018-07-31 DIAGNOSIS — G4700 Insomnia, unspecified: Secondary | ICD-10-CM | POA: Diagnosis not present

## 2018-07-31 DIAGNOSIS — J209 Acute bronchitis, unspecified: Secondary | ICD-10-CM | POA: Diagnosis not present

## 2018-07-31 DIAGNOSIS — E039 Hypothyroidism, unspecified: Secondary | ICD-10-CM | POA: Diagnosis not present

## 2018-07-31 DIAGNOSIS — I2510 Atherosclerotic heart disease of native coronary artery without angina pectoris: Secondary | ICD-10-CM | POA: Diagnosis not present

## 2018-07-31 DIAGNOSIS — Z1211 Encounter for screening for malignant neoplasm of colon: Secondary | ICD-10-CM | POA: Diagnosis not present

## 2018-08-16 ENCOUNTER — Other Ambulatory Visit: Payer: Self-pay

## 2018-08-16 NOTE — Patient Outreach (Signed)
[facility]) Care Management  08/16/2018  Nathaniel Ibarra 12/11/1941 [id number]   Referral Date: 08/16/18 Referral Source: [facility] Concierge Referral Reason: Additional resources while in coverage gap.   Outreach Attempt:Spoke with wife [name] who has PHI.  She is able to verify HIPAA.  Discussed reason for referral.  She states that patient is in the doughnut hole with his Eliquis.  She states that now the medication will be costing $111 per month.  She states that it was normally $45. She states she called [facility] to find out why and was told he was in the doughnut hole.  Discussed how [facility] could assist with finding other avenues to get medication cheaper while in the doughnut hole or patient assistance programs.  She states it is not like they cannot afford medication but was looking for cheaper options.  She states she knows that patient would not qualify for patient assistance at this time.  Discussed that pharmacist maybe able to help find a cheaper pharmacy for the medication but she declined that as well.    Discussed other [facility] services.  She declined those as well.     Plan: RN CM will send letter and close case.     Jone Baseman, RN, MSN [facility] Care Management Care Management Coordinator Direct Line [phone] Toll Free: [phone]  Fax: [phone]

## 2018-08-17 DIAGNOSIS — R0602 Shortness of breath: Secondary | ICD-10-CM | POA: Diagnosis not present

## 2018-08-17 DIAGNOSIS — I5032 Chronic diastolic (congestive) heart failure: Secondary | ICD-10-CM | POA: Diagnosis not present

## 2018-08-17 DIAGNOSIS — G4733 Obstructive sleep apnea (adult) (pediatric): Secondary | ICD-10-CM | POA: Diagnosis not present

## 2018-09-17 DIAGNOSIS — G4733 Obstructive sleep apnea (adult) (pediatric): Secondary | ICD-10-CM | POA: Diagnosis not present

## 2018-09-17 DIAGNOSIS — I5032 Chronic diastolic (congestive) heart failure: Secondary | ICD-10-CM | POA: Diagnosis not present

## 2018-09-17 DIAGNOSIS — R0602 Shortness of breath: Secondary | ICD-10-CM | POA: Diagnosis not present

## 2018-10-12 DIAGNOSIS — B9689 Other specified bacterial agents as the cause of diseases classified elsewhere: Secondary | ICD-10-CM | POA: Diagnosis not present

## 2018-10-12 DIAGNOSIS — J209 Acute bronchitis, unspecified: Secondary | ICD-10-CM | POA: Diagnosis not present

## 2018-10-12 DIAGNOSIS — J019 Acute sinusitis, unspecified: Secondary | ICD-10-CM | POA: Diagnosis not present

## 2018-10-17 DIAGNOSIS — R0602 Shortness of breath: Secondary | ICD-10-CM | POA: Diagnosis not present

## 2018-10-17 DIAGNOSIS — G4733 Obstructive sleep apnea (adult) (pediatric): Secondary | ICD-10-CM | POA: Diagnosis not present

## 2018-10-17 DIAGNOSIS — I5032 Chronic diastolic (congestive) heart failure: Secondary | ICD-10-CM | POA: Diagnosis not present

## 2018-10-19 DIAGNOSIS — J019 Acute sinusitis, unspecified: Secondary | ICD-10-CM | POA: Diagnosis not present

## 2018-10-19 DIAGNOSIS — B9689 Other specified bacterial agents as the cause of diseases classified elsewhere: Secondary | ICD-10-CM | POA: Diagnosis not present

## 2018-10-19 DIAGNOSIS — J209 Acute bronchitis, unspecified: Secondary | ICD-10-CM | POA: Diagnosis not present

## 2018-11-17 DIAGNOSIS — I5032 Chronic diastolic (congestive) heart failure: Secondary | ICD-10-CM | POA: Diagnosis not present

## 2018-11-17 DIAGNOSIS — R0602 Shortness of breath: Secondary | ICD-10-CM | POA: Diagnosis not present

## 2018-11-17 DIAGNOSIS — G4733 Obstructive sleep apnea (adult) (pediatric): Secondary | ICD-10-CM | POA: Diagnosis not present

## 2018-12-06 DIAGNOSIS — R351 Nocturia: Secondary | ICD-10-CM | POA: Diagnosis not present

## 2018-12-06 DIAGNOSIS — N401 Enlarged prostate with lower urinary tract symptoms: Secondary | ICD-10-CM | POA: Diagnosis not present

## 2018-12-06 DIAGNOSIS — Z8551 Personal history of malignant neoplasm of bladder: Secondary | ICD-10-CM | POA: Diagnosis not present

## 2018-12-20 DIAGNOSIS — I10 Essential (primary) hypertension: Secondary | ICD-10-CM | POA: Diagnosis not present

## 2018-12-20 DIAGNOSIS — E039 Hypothyroidism, unspecified: Secondary | ICD-10-CM | POA: Diagnosis not present

## 2018-12-20 DIAGNOSIS — E782 Mixed hyperlipidemia: Secondary | ICD-10-CM | POA: Diagnosis not present

## 2018-12-20 DIAGNOSIS — E559 Vitamin D deficiency, unspecified: Secondary | ICD-10-CM | POA: Diagnosis not present

## 2018-12-20 DIAGNOSIS — E669 Obesity, unspecified: Secondary | ICD-10-CM | POA: Diagnosis not present

## 2018-12-24 DIAGNOSIS — J209 Acute bronchitis, unspecified: Secondary | ICD-10-CM | POA: Diagnosis not present

## 2018-12-24 DIAGNOSIS — I2510 Atherosclerotic heart disease of native coronary artery without angina pectoris: Secondary | ICD-10-CM | POA: Diagnosis not present

## 2018-12-24 DIAGNOSIS — F321 Major depressive disorder, single episode, moderate: Secondary | ICD-10-CM | POA: Diagnosis not present

## 2018-12-24 DIAGNOSIS — E039 Hypothyroidism, unspecified: Secondary | ICD-10-CM | POA: Diagnosis not present

## 2018-12-24 DIAGNOSIS — G4700 Insomnia, unspecified: Secondary | ICD-10-CM | POA: Diagnosis not present

## 2018-12-24 DIAGNOSIS — I4891 Unspecified atrial fibrillation: Secondary | ICD-10-CM | POA: Diagnosis not present

## 2019-02-04 DIAGNOSIS — F321 Major depressive disorder, single episode, moderate: Secondary | ICD-10-CM | POA: Diagnosis not present

## 2019-02-04 DIAGNOSIS — I2510 Atherosclerotic heart disease of native coronary artery without angina pectoris: Secondary | ICD-10-CM | POA: Diagnosis not present

## 2019-02-04 DIAGNOSIS — E039 Hypothyroidism, unspecified: Secondary | ICD-10-CM | POA: Diagnosis not present

## 2019-02-04 DIAGNOSIS — I4891 Unspecified atrial fibrillation: Secondary | ICD-10-CM | POA: Diagnosis not present

## 2019-02-04 DIAGNOSIS — G4700 Insomnia, unspecified: Secondary | ICD-10-CM | POA: Diagnosis not present

## 2019-03-07 ENCOUNTER — Ambulatory Visit: Payer: Self-pay | Admitting: Podiatry

## 2019-05-06 DIAGNOSIS — E039 Hypothyroidism, unspecified: Secondary | ICD-10-CM | POA: Diagnosis not present

## 2019-05-10 DIAGNOSIS — F321 Major depressive disorder, single episode, moderate: Secondary | ICD-10-CM | POA: Diagnosis not present

## 2019-05-10 DIAGNOSIS — I2510 Atherosclerotic heart disease of native coronary artery without angina pectoris: Secondary | ICD-10-CM | POA: Diagnosis not present

## 2019-05-10 DIAGNOSIS — I4891 Unspecified atrial fibrillation: Secondary | ICD-10-CM | POA: Diagnosis not present

## 2019-05-10 DIAGNOSIS — G4700 Insomnia, unspecified: Secondary | ICD-10-CM | POA: Diagnosis not present

## 2019-05-10 DIAGNOSIS — E039 Hypothyroidism, unspecified: Secondary | ICD-10-CM | POA: Diagnosis not present

## 2019-07-26 DIAGNOSIS — Z8679 Personal history of other diseases of the circulatory system: Secondary | ICD-10-CM | POA: Diagnosis not present

## 2019-07-26 DIAGNOSIS — I2510 Atherosclerotic heart disease of native coronary artery without angina pectoris: Secondary | ICD-10-CM | POA: Diagnosis not present

## 2019-07-26 DIAGNOSIS — I447 Left bundle-branch block, unspecified: Secondary | ICD-10-CM | POA: Diagnosis not present

## 2019-07-26 DIAGNOSIS — R001 Bradycardia, unspecified: Secondary | ICD-10-CM | POA: Diagnosis not present

## 2019-07-26 DIAGNOSIS — R5383 Other fatigue: Secondary | ICD-10-CM | POA: Diagnosis not present

## 2019-08-02 DIAGNOSIS — I2510 Atherosclerotic heart disease of native coronary artery without angina pectoris: Secondary | ICD-10-CM | POA: Diagnosis not present

## 2019-08-08 DIAGNOSIS — I2510 Atherosclerotic heart disease of native coronary artery without angina pectoris: Secondary | ICD-10-CM | POA: Diagnosis not present

## 2019-08-08 DIAGNOSIS — R001 Bradycardia, unspecified: Secondary | ICD-10-CM | POA: Diagnosis not present

## 2019-08-08 DIAGNOSIS — I447 Left bundle-branch block, unspecified: Secondary | ICD-10-CM | POA: Diagnosis not present

## 2019-08-19 DIAGNOSIS — I4891 Unspecified atrial fibrillation: Secondary | ICD-10-CM | POA: Diagnosis not present

## 2019-08-19 DIAGNOSIS — E039 Hypothyroidism, unspecified: Secondary | ICD-10-CM | POA: Diagnosis not present

## 2019-08-19 DIAGNOSIS — Z23 Encounter for immunization: Secondary | ICD-10-CM | POA: Diagnosis not present

## 2019-08-19 DIAGNOSIS — G4700 Insomnia, unspecified: Secondary | ICD-10-CM | POA: Diagnosis not present

## 2019-08-19 DIAGNOSIS — I2510 Atherosclerotic heart disease of native coronary artery without angina pectoris: Secondary | ICD-10-CM | POA: Diagnosis not present

## 2019-08-19 DIAGNOSIS — F321 Major depressive disorder, single episode, moderate: Secondary | ICD-10-CM | POA: Diagnosis not present

## 2019-09-09 DIAGNOSIS — F321 Major depressive disorder, single episode, moderate: Secondary | ICD-10-CM | POA: Diagnosis not present

## 2019-09-22 IMAGING — DX DG CHEST 1V PORT
1 series · 1 of 1 positions shown · non-contrast
Comparison: 11/13/2017

CLINICAL DATA: No story failure. Cardiac catheterization yesterday.

EXAM:
PORTABLE CHEST 1 VIEW

[chest ap]
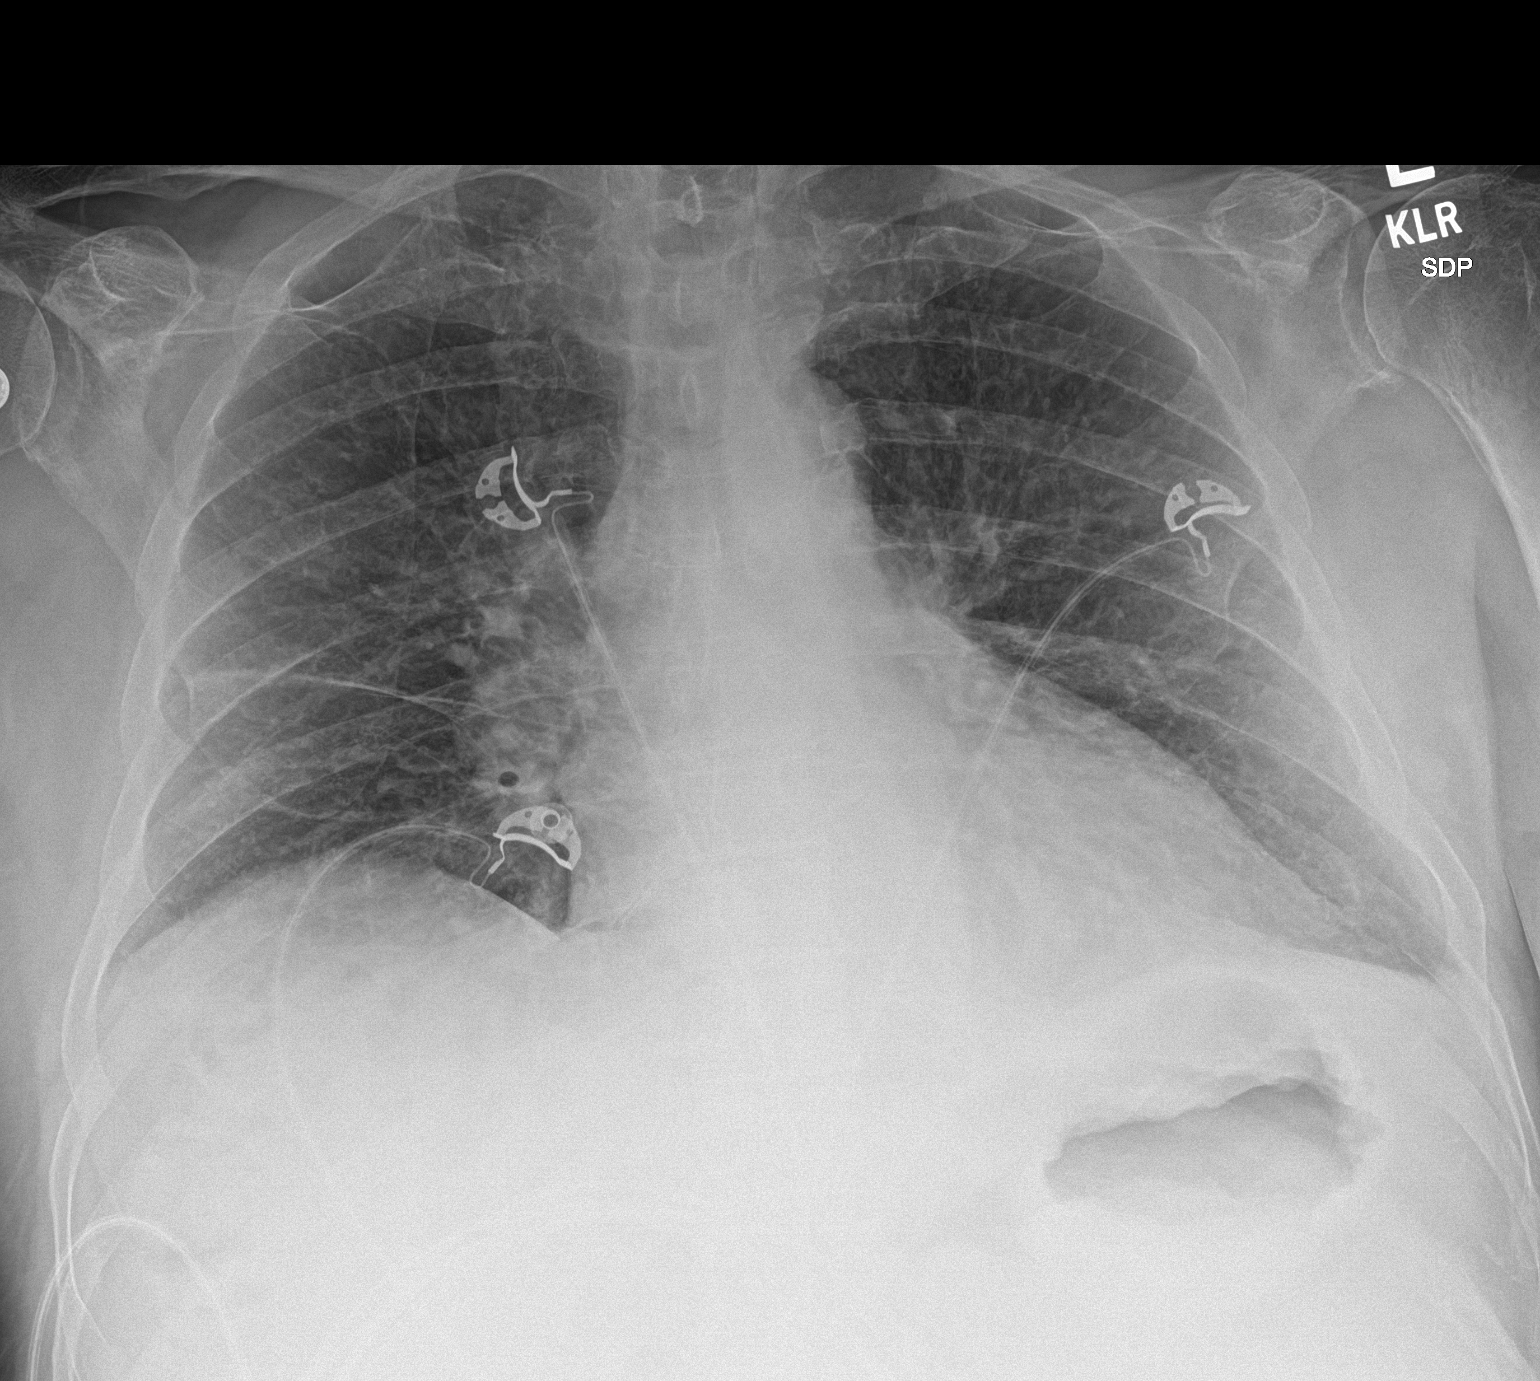

[1 of 1 positions shown; findings below may reference images not displayed]

FINDINGS: Borderline cardiomegaly. Mediastinal shadows are normal. Venous
hypertension with mild interstitial edema. No consolidation. Mild
atelectasis at both lung bases.
IMPRESSION: Mild interstitial edema persists. Mild atelectasis at the lung
bases.

## 2019-10-07 DIAGNOSIS — F321 Major depressive disorder, single episode, moderate: Secondary | ICD-10-CM | POA: Diagnosis not present

## 2019-10-24 DIAGNOSIS — R351 Nocturia: Secondary | ICD-10-CM | POA: Diagnosis not present

## 2019-10-24 DIAGNOSIS — Z8551 Personal history of malignant neoplasm of bladder: Secondary | ICD-10-CM | POA: Diagnosis not present

## 2019-10-24 DIAGNOSIS — N401 Enlarged prostate with lower urinary tract symptoms: Secondary | ICD-10-CM | POA: Diagnosis not present

## 2019-11-19 DIAGNOSIS — M25562 Pain in left knee: Secondary | ICD-10-CM | POA: Diagnosis not present

## 2019-11-19 DIAGNOSIS — M1712 Unilateral primary osteoarthritis, left knee: Secondary | ICD-10-CM | POA: Diagnosis not present

## 2019-12-20 DIAGNOSIS — U071 COVID-19: Secondary | ICD-10-CM

## 2019-12-20 HISTORY — DX: COVID-19: U07.1

## 2020-01-06 DIAGNOSIS — I2510 Atherosclerotic heart disease of native coronary artery without angina pectoris: Secondary | ICD-10-CM | POA: Diagnosis not present

## 2020-01-06 DIAGNOSIS — R0602 Shortness of breath: Secondary | ICD-10-CM | POA: Diagnosis not present

## 2020-01-06 DIAGNOSIS — Z8679 Personal history of other diseases of the circulatory system: Secondary | ICD-10-CM | POA: Diagnosis not present

## 2020-01-06 DIAGNOSIS — I447 Left bundle-branch block, unspecified: Secondary | ICD-10-CM | POA: Diagnosis not present

## 2020-01-06 DIAGNOSIS — R001 Bradycardia, unspecified: Secondary | ICD-10-CM | POA: Diagnosis not present

## 2020-01-06 DIAGNOSIS — I10 Essential (primary) hypertension: Secondary | ICD-10-CM | POA: Diagnosis not present

## 2020-02-04 DIAGNOSIS — L821 Other seborrheic keratosis: Secondary | ICD-10-CM | POA: Diagnosis not present

## 2020-02-04 DIAGNOSIS — L578 Other skin changes due to chronic exposure to nonionizing radiation: Secondary | ICD-10-CM | POA: Diagnosis not present

## 2020-02-04 DIAGNOSIS — L820 Inflamed seborrheic keratosis: Secondary | ICD-10-CM | POA: Diagnosis not present

## 2020-04-08 ENCOUNTER — Ambulatory Visit: Payer: PPO | Admitting: Dermatology

## 2020-04-20 DIAGNOSIS — J40 Bronchitis, not specified as acute or chronic: Secondary | ICD-10-CM | POA: Diagnosis not present

## 2020-05-16 ENCOUNTER — Emergency Department: Payer: PPO

## 2020-05-16 ENCOUNTER — Encounter: Payer: Self-pay | Admitting: Internal Medicine

## 2020-05-16 ENCOUNTER — Inpatient Hospital Stay
Admission: EM | Admit: 2020-05-16 | Discharge: 2020-05-18 | DRG: 293 | Disposition: A | Discharge status: Home / Self Care | Payer: PPO | Attending: Internal Medicine | Admitting: Internal Medicine

## 2020-05-16 ENCOUNTER — Other Ambulatory Visit: Payer: Self-pay

## 2020-05-16 DIAGNOSIS — I252 Old myocardial infarction: Secondary | ICD-10-CM

## 2020-05-16 DIAGNOSIS — I447 Left bundle-branch block, unspecified: Secondary | ICD-10-CM | POA: Diagnosis not present

## 2020-05-16 DIAGNOSIS — Z8551 Personal history of malignant neoplasm of bladder: Secondary | ICD-10-CM

## 2020-05-16 DIAGNOSIS — I509 Heart failure, unspecified: Secondary | ICD-10-CM

## 2020-05-16 DIAGNOSIS — E039 Hypothyroidism, unspecified: Secondary | ICD-10-CM | POA: Diagnosis not present

## 2020-05-16 DIAGNOSIS — K219 Gastro-esophageal reflux disease without esophagitis: Secondary | ICD-10-CM | POA: Diagnosis not present

## 2020-05-16 DIAGNOSIS — I480 Paroxysmal atrial fibrillation: Secondary | ICD-10-CM

## 2020-05-16 DIAGNOSIS — Z825 Family history of asthma and other chronic lower respiratory diseases: Secondary | ICD-10-CM | POA: Diagnosis not present

## 2020-05-16 DIAGNOSIS — Z79890 Hormone replacement therapy (postmenopausal): Secondary | ICD-10-CM | POA: Diagnosis not present

## 2020-05-16 DIAGNOSIS — I5023 Acute on chronic systolic (congestive) heart failure: Secondary | ICD-10-CM

## 2020-05-16 DIAGNOSIS — I2510 Atherosclerotic heart disease of native coronary artery without angina pectoris: Secondary | ICD-10-CM | POA: Diagnosis present

## 2020-05-16 DIAGNOSIS — Z20822 Contact with and (suspected) exposure to covid-19: Secondary | ICD-10-CM | POA: Diagnosis present

## 2020-05-16 DIAGNOSIS — I160 Hypertensive urgency: Secondary | ICD-10-CM | POA: Diagnosis not present

## 2020-05-16 DIAGNOSIS — Z7901 Long term (current) use of anticoagulants: Secondary | ICD-10-CM

## 2020-05-16 DIAGNOSIS — Z7902 Long term (current) use of antithrombotics/antiplatelets: Secondary | ICD-10-CM

## 2020-05-16 DIAGNOSIS — G4700 Insomnia, unspecified: Secondary | ICD-10-CM | POA: Diagnosis present

## 2020-05-16 DIAGNOSIS — Z79899 Other long term (current) drug therapy: Secondary | ICD-10-CM | POA: Diagnosis not present

## 2020-05-16 DIAGNOSIS — I10 Essential (primary) hypertension: Secondary | ICD-10-CM | POA: Diagnosis not present

## 2020-05-16 DIAGNOSIS — Z9114 Patient's other noncompliance with medication regimen: Secondary | ICD-10-CM

## 2020-05-16 DIAGNOSIS — R0602 Shortness of breath: Secondary | ICD-10-CM | POA: Diagnosis not present

## 2020-05-16 DIAGNOSIS — Z7982 Long term (current) use of aspirin: Secondary | ICD-10-CM | POA: Diagnosis not present

## 2020-05-16 DIAGNOSIS — I110 Hypertensive heart disease with heart failure: Principal | ICD-10-CM | POA: Diagnosis present

## 2020-05-16 DIAGNOSIS — N400 Enlarged prostate without lower urinary tract symptoms: Secondary | ICD-10-CM | POA: Diagnosis not present

## 2020-05-16 DIAGNOSIS — I5043 Acute on chronic combined systolic (congestive) and diastolic (congestive) heart failure: Secondary | ICD-10-CM | POA: Diagnosis present

## 2020-05-16 DIAGNOSIS — I4891 Unspecified atrial fibrillation: Secondary | ICD-10-CM | POA: Diagnosis not present

## 2020-05-16 DIAGNOSIS — I5021 Acute systolic (congestive) heart failure: Secondary | ICD-10-CM | POA: Diagnosis not present

## 2020-05-16 LAB — BRAIN NATRIURETIC PEPTIDE: B Natriuretic Peptide: 359.9 pg/mL — ABNORMAL HIGH (ref 0.0–100.0)

## 2020-05-16 LAB — BASIC METABOLIC PANEL
Anion gap: 6 (ref 5–15)
BUN: 37 mg/dL — ABNORMAL HIGH (ref 8–23)
CO2: 25 mmol/L (ref 22–32)
Calcium: 8.6 mg/dL — ABNORMAL LOW (ref 8.9–10.3)
Chloride: 111 mmol/L (ref 98–111)
Creatinine, Ser: 1.03 mg/dL (ref 0.61–1.24)
GFR calc Af Amer: >60 mL/min (ref >60)
GFR calc non Af Amer: >60 mL/min (ref >60)
Glucose, Bld: 112 mg/dL — ABNORMAL HIGH (ref 70–99)
Potassium: 3.9 mmol/L (ref 3.5–5.1)
Sodium: 142 mmol/L (ref 135–145)

## 2020-05-16 LAB — CBC
HCT: 40.6 % (ref 39.0–52.0)
Hemoglobin: 13.6 g/dL (ref 13.0–17.0)
MCH: 32.2 pg (ref 26.0–34.0)
MCHC: 33.5 g/dL (ref 30.0–36.0)
MCV: 96 fL (ref 80.0–100.0)
Platelets: 347 10*3/uL (ref 150–400)
RBC: 4.23 MIL/uL (ref 4.22–5.81)
RDW: 12.7 % (ref 11.5–15.5)
WBC: 8.8 10*3/uL (ref 4.0–10.5)
nRBC: 0 % (ref 0.0–0.2)

## 2020-05-16 LAB — TROPONIN I (HIGH SENSITIVITY)
Troponin I (High Sensitivity): 24 ng/L — ABNORMAL HIGH (ref <18)
Troponin I (High Sensitivity): 24 ng/L — ABNORMAL HIGH (ref <18)

## 2020-05-16 LAB — SARS CORONAVIRUS 2 BY RT PCR (HOSPITAL ORDER, PERFORMED IN ~~LOC~~ HOSPITAL LAB): SARS Coronavirus 2: NEGATIVE

## 2020-05-16 LAB — TSH: TSH: 1.103 u[IU]/mL (ref 0.350–4.500)

## 2020-05-16 MED ORDER — TEMAZEPAM 7.5 MG PO CAPS: 7.5000 mg | ORAL_CAPSULE | Freq: Every evening | ORAL | Status: DC | PRN

## 2020-05-16 MED ORDER — FINASTERIDE 5 MG PO TABS
5.0000 mg | ORAL_TABLET | Freq: Every day | ORAL | Status: DC
  Administered 2020-05-17 – 2020-05-18 (×2): 5 mg via ORAL
  Filled 2020-05-16 (×2): qty 1

## 2020-05-16 MED ORDER — DILTIAZEM HCL-DEXTROSE 125-5 MG/125ML-% IV SOLN (PREMIX)
5.0000 mg/h | INTRAVENOUS | Status: DC
  Administered 2020-05-16: 5 mg/h via INTRAVENOUS

## 2020-05-16 MED ORDER — LISINOPRIL 10 MG PO TABS
10.0000 mg | ORAL_TABLET | Freq: Every day | ORAL | Status: DC
  Administered 2020-05-16 – 2020-05-17 (×2): 10 mg via ORAL
  Filled 2020-05-16 (×2): qty 1

## 2020-05-16 MED ORDER — PANTOPRAZOLE SODIUM 40 MG PO TBEC
40.0000 mg | DELAYED_RELEASE_TABLET | Freq: Every day | ORAL | Status: DC
  Administered 2020-05-16 – 2020-05-18 (×3): 40 mg via ORAL
  Filled 2020-05-16 (×3): qty 1

## 2020-05-16 MED ORDER — METOPROLOL SUCCINATE ER 25 MG PO TB24
25.0000 mg | ORAL_TABLET | Freq: Every day | ORAL | Status: DC
  Administered 2020-05-16 – 2020-05-17 (×2): 25 mg via ORAL
  Filled 2020-05-16 (×2): qty 1

## 2020-05-16 MED ORDER — ACETAMINOPHEN 325 MG PO TABS: 650.0000 mg | ORAL_TABLET | ORAL | Status: DC | PRN

## 2020-05-16 MED ORDER — BUPROPION HCL ER (XL) 150 MG PO TB24
150.0000 mg | ORAL_TABLET | Freq: Every day | ORAL | Status: DC
  Administered 2020-05-16 – 2020-05-18 (×3): 150 mg via ORAL
  Filled 2020-05-16 (×3): qty 1

## 2020-05-16 MED ORDER — APIXABAN 5 MG PO TABS
5.0000 mg | ORAL_TABLET | Freq: Two times a day (BID) | ORAL | Status: DC
  Administered 2020-05-16 – 2020-05-18 (×5): 5 mg via ORAL
  Filled 2020-05-16 (×5): qty 1

## 2020-05-16 MED ORDER — FUROSEMIDE 10 MG/ML IJ SOLN
40.0000 mg | Freq: Once | INTRAMUSCULAR | Status: AC
  Administered 2020-05-16: 40 mg via INTRAVENOUS
  Filled 2020-05-16: qty 4

## 2020-05-16 MED ORDER — AMIODARONE HCL 200 MG PO TABS
400.0000 mg | ORAL_TABLET | Freq: Every day | ORAL | Status: DC
  Administered 2020-05-16 – 2020-05-17 (×2): 400 mg via ORAL
  Filled 2020-05-16 (×2): qty 2

## 2020-05-16 MED ORDER — FUROSEMIDE 10 MG/ML IJ SOLN
40.0000 mg | Freq: Two times a day (BID) | INTRAMUSCULAR | Status: DC
  Administered 2020-05-16 – 2020-05-17 (×2): 40 mg via INTRAVENOUS
  Filled 2020-05-16 (×2): qty 4

## 2020-05-16 MED ORDER — ONDANSETRON HCL 4 MG/2ML IJ SOLN
4.0000 mg | Freq: Four times a day (QID) | INTRAMUSCULAR | Status: DC | PRN
  Administered 2020-05-18: 4 mg via INTRAVENOUS
  Filled 2020-05-16: qty 2

## 2020-05-16 MED ORDER — LEVALBUTEROL HCL 1.25 MG/0.5ML IN NEBU
1.2500 mg | INHALATION_SOLUTION | Freq: Three times a day (TID) | RESPIRATORY_TRACT | Status: DC | PRN
  Administered 2020-05-17: 1.25 mg via RESPIRATORY_TRACT
  Filled 2020-05-16 (×2): qty 0.5

## 2020-05-16 MED ORDER — SODIUM CHLORIDE 0.9% FLUSH
3.0000 mL | Freq: Two times a day (BID) | INTRAVENOUS | Status: DC
  Administered 2020-05-16 – 2020-05-17 (×4): 3 mL via INTRAVENOUS

## 2020-05-16 MED ORDER — SODIUM CHLORIDE 0.9% FLUSH: 3.0000 mL | INTRAVENOUS | Status: DC | PRN

## 2020-05-16 MED ORDER — LEVOTHYROXINE SODIUM 50 MCG PO TABS
75.0000 ug | ORAL_TABLET | Freq: Every day | ORAL | Status: DC
  Administered 2020-05-17 – 2020-05-18 (×2): 75 ug via ORAL
  Filled 2020-05-16 (×2): qty 1

## 2020-05-16 MED ORDER — TAMSULOSIN HCL 0.4 MG PO CAPS
0.4000 mg | ORAL_CAPSULE | Freq: Two times a day (BID) | ORAL | Status: DC
  Administered 2020-05-16 – 2020-05-18 (×4): 0.4 mg via ORAL
  Filled 2020-05-16 (×4): qty 1

## 2020-05-16 MED ORDER — SODIUM CHLORIDE 0.9 % IV SOLN: 250.0000 mL | INTRAVENOUS | Status: DC | PRN

## 2020-05-16 MED ORDER — DILTIAZEM LOAD VIA INFUSION
10.0000 mg | Freq: Once | INTRAVENOUS | Status: DC
  Filled 2020-05-16: qty 10

## 2020-05-16 NOTE — Progress Notes (Addendum)
On assessment pt was wheezing and diminished lung sounds. Pt was already on IV lasix. Notify NP [place]. Will continue to monitor.  Update 2223: Nathaniel Ibarra states will place order. Will continue to monitor.

## 2020-05-16 NOTE — ED Notes (Signed)
Pt placed on 3L [place] for dropping oxygen sats into upper 80's. Will continue to monitor. Currently 95% on 3 L [place].

## 2020-05-16 NOTE — ED Provider Notes (Signed)
[facility] Emergency Department Provider Note   ____________________________________________   First MD Initiated Contact with Patient 05/16/20 1057     (approximate)  I have reviewed the triage vital signs and the nursing notes.   HISTORY  Chief Complaint Shortness of Breath and Foot Swelling    HPI Nathaniel Ibarra is a 79 y.o. male with past medical history of CAD, atrial fibrillation, and CHF who presents to the ED complaining of shortness of breath. Patient reports that he stopped taking some of his medications a couple of months ago, including his fluid medicine and Eliquis. Over the past couple of days he has developed increased shortness of breath, initially with exertion but now with rest. This is been associated with swelling in both of his legs, but he denies any redness or pain in either leg. He has had a dry cough, but denies any fevers or chest pain. He denies any palpitations, states he does not usually stay in atrial fibrillation.        Past Medical History:  Diagnosis Date  . Bladder cancer ([place])   . BPH (benign prostatic hyperplasia)   . GERD (gastroesophageal reflux disease)   . Hypertension   . Myocardial infarction [facility])    cath with stent in 2016  . Thyroid disease     Patient Active Problem List   Diagnosis Date Noted  . CHF (congestive heart failure) ([place]) 11/13/2017  . Atrial fibrillation with RVR ([place]) 11/12/2017  . Pulmonary edema cardiac cause ([place]) 11/12/2017  . Elevated troponin 11/12/2017  . Coronary artery disease 09/02/2015  . Acute coronary syndrome ([place]) 09/01/2015  . Unstable angina ([place]) 09/01/2015  . Chest pain 08/16/2015    Past Surgical History:  Procedure Laterality Date  . CARDIAC CATHETERIZATION N/A 09/01/2015   Procedure: Left Heart Cath and Coronary Angiography;  Surgeon: Dionisio David, MD;  Location: [place] CV [facility];  Service: Cardiovascular;  Laterality: N/A;  . CARDIAC CATHETERIZATION  N/A 09/01/2015   Procedure: Coronary Stent Intervention;  Surgeon: Yolonda Kida, MD;  Location: [place] CV [facility];  Service: Cardiovascular;  Laterality: N/A;  . CATARACT EXTRACTION W/PHACO Left 11/23/2016   Procedure: CATARACT EXTRACTION PHACO AND INTRAOCULAR LENS PLACEMENT (IOC);  Surgeon: Leandrew Koyanagi, MD;  Location: [place];  Service: Ophthalmology;  Laterality: Left;  . CATARACT EXTRACTION W/PHACO Right 02/01/2017   Procedure: CATARACT EXTRACTION PHACO AND INTRAOCULAR LENS PLACEMENT ([place]) Complicated  right toric lens;  Surgeon: Leandrew Koyanagi, MD;  Location: [facility];  Service: Ophthalmology;  Laterality: Right;  Malyugin toric Lens  . LEFT HEART CATH AND CORONARY ANGIOGRAPHY Right 11/14/2017   Procedure: LEFT HEART CATH AND CORONARY ANGIOGRAPHY;  Surgeon: Dionisio David, MD;  Location: [place] CV [facility];  Service: Cardiovascular;  Laterality: Right;    Prior to Admission medications   Medication Sig Start Date End Date Taking? Authorizing Provider  amiodarone (PACERONE) 200 MG tablet Take 2 tablets (400 mg total) by mouth daily. 11/11/17   Darel Hong, MD  amLODipine (NORVASC) 5 MG tablet Take 5 mg by mouth every Monday, Wednesday, and Friday.  05/09/15   [provider]  apixaban (ELIQUIS) 5 MG TABS tablet Take 1 tablet (5 mg total) by mouth 2 (two) times daily. 11/17/17   Max Sane, MD  aspirin EC 325 MG EC tablet Take 1 tablet (325 mg total) by mouth daily. Patient taking differently: Take 81 mg by mouth daily.  09/02/15   Barnabas Harries, PA-C  buPROPion (WELLBUTRIN XL) 150 MG 24 hr tablet Take 150 mg by mouth daily.    [provider]  clopidogrel (PLAVIX) 75 MG tablet Take 1 tablet (75 mg total) by mouth once. Patient not taking: Reported on 11/11/2017 09/02/15   Jasmine Pang A, PA-C  finasteride (PROSCAR) 5 MG tablet Take 5 mg by mouth daily. 08/03/15   [provider]  ibuprofen (MOTRIN IB) 200 MG tablet  Take 1 tablet (200 mg total) by mouth every 6 (six) hours as needed. Patient not taking: Reported on 11/11/2017 08/17/15   Epifanio Lesches, MD  ketotifen (ZADITOR) 0.025 % ophthalmic solution Place 1 drop into both eyes 2 (two) times daily. 11/17/17   Max Sane, MD  levothyroxine (SYNTHROID, LEVOTHROID) 75 MCG tablet Take 1 tablet (75 mcg total) by mouth every morning. 11/18/17   Max Sane, MD  metoprolol succinate (TOPROL-XL) 25 MG 24 hr tablet Take 1 tablet (25 mg total) by mouth daily. 11/17/17   Max Sane, MD  pantoprazole (PROTONIX) 40 MG tablet Take 40 mg by mouth daily as needed.     [provider]  tamsulosin (FLOMAX) 0.4 MG CAPS capsule Take 0.4 mg by mouth 2 (two) times daily.  08/01/15   [provider]  temazepam (RESTORIL) 7.5 MG capsule Take 1 capsule (7.5 mg total) by mouth at bedtime as needed for sleep. 11/17/17   Max Sane, MD    Allergies Patient has no known allergies.  Family History  Problem Relation Age of Onset  . Emphysema Mother   . Emphysema Father     Social History Social History   Tobacco Use  . Smoking status: Never Smoker  . Smokeless tobacco: Never Used  Substance Use Topics  . Alcohol use: No  . Drug use: No    Review of Systems  Constitutional: No fever/chills Eyes: No visual changes. ENT: No sore throat. Cardiovascular: Denies chest pain. Respiratory: Positive for cough and shortness of breath. Gastrointestinal: No abdominal pain.  No nausea, no vomiting.  No diarrhea.  No constipation. Genitourinary: Negative for dysuria. Musculoskeletal: Negative for back pain. Positive for leg swelling. Skin: Negative for rash. Neurological: Negative for headaches, focal weakness or numbness.  ____________________________________________   PHYSICAL EXAM:  VITAL SIGNS: ED Triage Vitals  Enc Vitals Group     BP 05/16/20 1044 (!) 175/80     Pulse Rate 05/16/20 1044 (!) 59     Resp 05/16/20 1044 (!) 26     Temp  05/16/20 1044 98.3 F (36.8 C)     Temp Source 05/16/20 1044 Oral     SpO2 05/16/20 1044 95 %     Weight 05/16/20 1045 207 lb (93.9 kg)     Height 05/16/20 1045 6' (1.829 m)     Head Circumference --      Peak Flow --      Pain Score 05/16/20 1045 0     Pain Loc --      Pain Edu? --      Excl. in [place]? --     Constitutional: Alert and oriented. Eyes: Conjunctivae are normal. Head: Atraumatic. Nose: No congestion/rhinnorhea. Mouth/Throat: Mucous membranes are moist. Neck: Normal ROM Cardiovascular: Tachycardic, irregularly irregular rhythm. Grossly normal heart sounds. Respiratory: Tachypneic with increased respiratory effort.  No retractions. Lungs with crackles to bilateral bases. Gastrointestinal: Soft and nontender. No distention. Genitourinary: deferred Musculoskeletal: No lower extremity tenderness, 2+ pitting edema to knees bilaterally. Neurologic:  Normal speech and language. No gross focal neurologic deficits are appreciated.  Skin:  Skin is warm, dry and intact. No rash noted. Psychiatric: Mood and affect are normal. Speech and behavior are normal.  ____________________________________________   LABS (all labs ordered are listed, but only abnormal results are displayed)  Labs Reviewed  BASIC METABOLIC PANEL - Abnormal; Notable for the following components:      Result Value   Glucose, Bld 112 (*)    BUN 37 (*)    Calcium 8.6 (*)    All other components within normal limits  BRAIN NATRIURETIC PEPTIDE - Abnormal; Notable for the following components:   B Natriuretic Peptide 359.9 (*)    All other components within normal limits  SARS CORONAVIRUS 2 BY RT PCR (HOSPITAL ORDER, [place])  CBC  TROPONIN I (HIGH SENSITIVITY)   ____________________________________________  EKG  ED ECG REPORT I, Blake Divine, the attending physician, personally viewed and interpreted this ECG.   Date: 05/16/2020  EKG Time: 10:51  Rate: 131  Rhythm:  atrial fibrillation, rate 131  Axis: LAD  Intervals:left bundle branch block  ST&T Change: None   PROCEDURES  Procedure(s) performed (including Critical Care):  .Critical Care Performed by: Blake Divine, MD Authorized by: Blake Divine, MD   Critical care provider statement:    Critical care time (minutes):  45   Critical care time was exclusive of:  Separately billable procedures and treating other patients and teaching time   Critical care was necessary to treat or prevent imminent or life-threatening deterioration of the following conditions:  Cardiac failure   Critical care was time spent personally by me on the following activities:  Discussions with consultants, evaluation of patient's response to treatment, examination of patient, ordering and performing treatments and interventions, ordering and review of laboratory studies, ordering and review of radiographic studies, pulse oximetry, re-evaluation of patient's condition, obtaining history from patient or surrogate and review of old charts   I assumed direction of critical care for this patient from another provider in my specialty: no   .1-3 Lead EKG Interpretation Performed by: Blake Divine, MD Authorized by: Blake Divine, MD     Interpretation: abnormal     ECG rate:  132   ECG rate assessment: tachycardic     Rhythm: atrial fibrillation     Ectopy: none     Conduction: normal       ____________________________________________   INITIAL IMPRESSION / ASSESSMENT AND PLAN / ED COURSE       79 year old male with history of CAD, atrial fibrillation, and CHF who presents to the ED complaining of increasing difficulty breathing over the past couple of days associated with leg swelling, admits to stopping many of his medications a couple of months ago. He is noted to be in atrial fibrillation with RVR upon arrival, blood pressure is stable with this and we will attempt to control heart rate with Cardizem drip.  He does have a Baron C noted on EKG consistent with left bundle branch block, seen on prior EKG as well. His shortness of breath is likely due to CHF exacerbation, especially since he has stopped taking his diuretic. Plan to check chest x-ray, CBC, BMP, troponin, and BNP. Anticipate admission for rate control and diuresis.  Heart rate is now much improved and consistently below 100, blood pressure remained stable.  Lab work is unremarkable, chest x-ray does show findings consistent with CHF and we will diurese with Lasix.  Case discussed with hospitalist for admission.      ____________________________________________   FINAL  CLINICAL IMPRESSION(S) / ED DIAGNOSES  Final diagnoses:  Acute on chronic congestive heart failure, unspecified heart failure type [facility])  Atrial fibrillation with RVR [facility]     ED Discharge Orders    None       Note:  This document was prepared using Dragon voice recognition software and may include unintentional dictation errors.   Blake Divine, MD 05/16/20 [phone]

## 2020-05-16 NOTE — ED Notes (Signed)
Admitting MD at bedside.

## 2020-05-16 NOTE — Progress Notes (Signed)
Pt admitted to rm 242. VSS, no complaints of pain, call bell within reach. Patient educated on heart failure booklet and given magnet. REDS reading 28%.

## 2020-05-16 NOTE — ED Triage Notes (Signed)
Pt arrived via POV with reports of swelling to feet and shortness of breath for the past 1-2 days. Pt takes a fluid pill, states he has not been taking for the past 1-2 months.  Pt states he got mixed up with his pills and isn't sure why he hasn't been taking it.   Pt has hx of afib as well.

## 2020-05-16 NOTE — H&P (Signed)
History and Physical    Nathaniel Ibarra [id number] DOB: 10-Jul-1941 DOA: 05/16/2020  PCP: Albina Billet, MD   Patient coming from: Home  I have personally briefly reviewed patient's old medical records in [place]  Chief Complaint: Shortness of breath  HPI: Nathaniel Ibarra is a 79 y.o. male with medical history significant for coronary artery disease, atrial fibrillation on chronic anticoagulation therapy and CHF who presents to the emergency room for evaluation of shortness of breath.  Patient reports that he stopped taking some of his medications a couple of months ago including his Eliquis and furosemide.  Over the last couple of days he developed worsening shortness of breath which initially started with exertion but now he is short of breath at rest.  Shortness of breath is associated with swelling in both lower extremities.  He has a dry cough but denies having any fever or chills.  He denies having any chest pain, nausea, vomiting, diaphoresis or palpitations. Twelve-lead EKG done in the emergency room showed rapid atrial fibrillation Chest x-ray shows cardiomegaly with pulmonary vascular congestion. Mild interstitial edema. Question a degree of congestive heart failure. No consolidation. Patient received a dose of Lasix 40 mg IV in the emergency room oh started on a Cardizem drip.   ED Course: Patient is a 79 year old Caucasian male with multiple medical problems who presented to the ER for evaluation of lower extremity swelling and shortness of breath.  Patient was found to be in rapid A. fib.  Patient admits to medication noncompliance.  He will be admitted to the hospital for acute CHF exacerbation.  Review of Systems: As per HPI otherwise 10 point review of systems negative.    Past Medical History:  Diagnosis Date  . Bladder cancer ([place])   . BPH (benign prostatic hyperplasia)   . GERD (gastroesophageal reflux disease)   . Hypertension   . Myocardial  infarction [facility])    cath with stent in 2016  . Thyroid disease     Past Surgical History:  Procedure Laterality Date  . CARDIAC CATHETERIZATION N/A 09/01/2015   Procedure: Left Heart Cath and Coronary Angiography;  Surgeon: Dionisio David, MD;  Location: [place] [facility];  Service: Cardiovascular;  Laterality: N/A;  . CARDIAC CATHETERIZATION N/A 09/01/2015   Procedure: Coronary Stent Intervention;  Surgeon: Yolonda Kida, MD;  Location: [place] [facility];  Service: Cardiovascular;  Laterality: N/A;  . CATARACT EXTRACTION W/PHACO Left 11/23/2016   Procedure: CATARACT EXTRACTION PHACO AND INTRAOCULAR LENS PLACEMENT (I[facility]);  Surgeon: Leandrew Koyanagi, MD;  Location: [place];  Service: Ophthalmology;  Laterality: Left;  . CATARACT EXTRACTION W/PHACO Right 02/01/2017   Procedure: CATARACT EXTRACTION PHACO AND INTRAOCULAR LENS PLACEMENT ([place]) Complicated  right toric lens;  Surgeon: Leandrew Koyanagi, MD;  Location: [facility];  Service: Ophthalmology;  Laterality: Right;  Malyugin toric Lens  . LEFT HEART CATH AND CORONARY ANGIOGRAPHY Right 11/14/2017   Procedure: LEFT HEART CATH AND CORONARY ANGIOGRAPHY;  Surgeon: Dionisio David, MD;  Location: [place] [facility];  Service: Cardiovascular;  Laterality: Right;     reports that he has never smoked. He has never used smokeless tobacco. He reports that he does not drink alcohol or use drugs.  No Known Allergies  Family History  Problem Relation Age of Onset  . Emphysema Mother   . Emphysema Father      Prior to Admission medications   Medication Sig Start Date End Date Taking? Authorizing Provider  amiodarone (  PACERONE) 200 MG tablet Take 2 tablets (400 mg total) by mouth daily. 11/11/17   Darel Hong, MD  amLODipine (NORVASC) 5 MG tablet Take 5 mg by mouth every Monday, Wednesday, and Friday.  05/09/15   [provider]  apixaban (ELIQUIS) 5 MG TABS tablet Take 1 tablet (5 mg total) by mouth  2 (two) times daily. 11/17/17   Max Sane, MD  aspirin EC 325 MG EC tablet Take 1 tablet (325 mg total) by mouth daily. Patient taking differently: Take 81 mg by mouth daily.  09/02/15   Barnabas Harries, PA-C  buPROPion (WELLBUTRIN XL) 150 MG 24 hr tablet Take 150 mg by mouth daily.    [provider]  clopidogrel (PLAVIX) 75 MG tablet Take 1 tablet (75 mg total) by mouth once. Patient not taking: Reported on 11/11/2017 09/02/15   Jasmine Pang A, PA-C  finasteride (PROSCAR) 5 MG tablet Take 5 mg by mouth daily. 08/03/15   [provider]  ibuprofen (MOTRIN IB) 200 MG tablet Take 1 tablet (200 mg total) by mouth every 6 (six) hours as needed. Patient not taking: Reported on 11/11/2017 08/17/15   Epifanio Lesches, MD  ketotifen (ZADITOR) 0.025 % ophthalmic solution Place 1 drop into both eyes 2 (two) times daily. 11/17/17   Max Sane, MD  levothyroxine (SYNTHROID, LEVOTHROID) 75 MCG tablet Take 1 tablet (75 mcg total) by mouth every morning. 11/18/17   Max Sane, MD  metoprolol succinate (TOPROL-XL) 25 MG 24 hr tablet Take 1 tablet (25 mg total) by mouth daily. 11/17/17   Max Sane, MD  pantoprazole (PROTONIX) 40 MG tablet Take 40 mg by mouth daily as needed.     [provider]  tamsulosin (FLOMAX) 0.4 MG CAPS capsule Take 0.4 mg by mouth 2 (two) times daily.  08/01/15   [provider]  temazepam (RESTORIL) 7.5 MG capsule Take 1 capsule (7.5 mg total) by mouth at bedtime as needed for sleep. 11/17/17   Max Sane, MD    Physical Exam: Vitals:   05/16/20 1044 05/16/20 1045 05/16/20 1130 05/16/20 1145  BP: (!) 175/80     Pulse: (!) 59  (!) 133 68  Resp: (!) 26  (!) 21 (!) 22  Temp: 98.3 F (36.8 C)     TempSrc: Oral     SpO2: 95%  (!) 89% 96%  Weight:  93.9 kg    Height:  6' (1.829 m)       Vitals:   05/16/20 1044 05/16/20 1045 05/16/20 1130 05/16/20 1145  BP: (!) 175/80     Pulse: (!) 59  (!) 133 68  Resp: (!) 26  (!) 21 (!) 22  Temp:  98.3 F (36.8 C)     TempSrc: Oral     SpO2: 95%  (!) 89% 96%  Weight:  93.9 kg    Height:  6' (1.829 m)      Constitutional: NAD, alert and oriented x 3 Eyes: PERRL, lids and conjunctivae normal ENMT: Mucous membranes are moist.  Neck: normal, supple, no masses, no thyromegaly Respiratory: Crackles at the bases, scattered wheezing. Normal respiratory effort. No accessory muscle use.  Cardiovascular: Regular rate and rhythm, no murmurs / rubs / gallops. 1+ extremity edema. 2+ pedal pulses. No carotid bruits.  Abdomen: no tenderness, no masses palpated. No hepatosplenomegaly. Bowel sounds positive.  Musculoskeletal: no clubbing / cyanosis. No joint deformity upper and lower extremities.  Skin: no rashes, lesions, ulcers.  Neurologic: No gross focal neurologic deficit. Psychiatric: Normal mood  and affect.   Labs on Admission: I have personally reviewed following labs and imaging studies  CBC: Recent Labs  Lab 05/16/20 1104  WBC 8.8  HGB 13.6  HCT 40.6  MCV 96.0  PLT [id number]   Basic Metabolic Panel: Recent Labs  Lab 05/16/20 1104  NA 142  K 3.9  CL 111  CO2 25  GLUCOSE 112*  BUN 37*  CREATININE 1.03  CALCIUM 8.6*   GFR: Estimated Creatinine Clearance: 70.3 mL/min (by C-G formula based on SCr of 1.03 mg/dL). Liver Function Tests: No results for input(s): AST, ALT, ALKPHOS, BILITOT, PROT, ALBUMIN in the last 168 hours. No results for input(s): LIPASE, AMYLASE in the last 168 hours. No results for input(s): AMMONIA in the last 168 hours. Coagulation Profile: No results for input(s): INR, PROTIME in the last 168 hours. Cardiac Enzymes: No results for input(s): CKTOTAL, CKMB, CKMBINDEX, TROPONINI in the last 168 hours. BNP (last 3 results) No results for input(s): PROBNP in the last 8760 hours. HbA1C: No results for input(s): HGBA1C in the last 72 hours. CBG: No results for input(s): GLUCAP in the last 168 hours. Lipid Profile: No results for input(s): CHOL, HDL,  LDLCALC, TRIG, CHOLHDL, LDLDIRECT in the last 72 hours. Thyroid Function Tests: No results for input(s): TSH, T4TOTAL, FREET4, T3FREE, THYROIDAB in the last 72 hours. Anemia Panel: No results for input(s): VITAMINB12, FOLATE, FERRITIN, TIBC, IRON, RETICCTPCT in the last 72 hours. Urine analysis:    Component Value Date/Time   COLORURINE YELLOW 06/07/2017 1115   APPEARANCEUR CLEAR 06/07/2017 1115   LABSPEC 1.025 06/07/2017 1115   PHURINE 5.5 06/07/2017 1115   GLUCOSEU NEGATIVE 06/07/2017 1115   HGBUR NEGATIVE 06/07/2017 1115   BILIRUBINUR NEGATIVE 06/07/2017 1115   KETONESUR TRACE (A) 06/07/2017 1115   PROTEINUR NEGATIVE 06/07/2017 1115   NITRITE NEGATIVE 06/07/2017 1115   LEUKOCYTESUR NEGATIVE 06/07/2017 1115    Radiological Exams on Admission: DG Chest Portable 1 View  Result Date: 05/16/2020 CLINICAL DATA:  Shortness of breath and lower extremity edema EXAM: PORTABLE CHEST 1 VIEW COMPARISON:  November 15, 2017 FINDINGS: There is mild interstitial pulmonary edema. No consolidation or pleural effusions. There is mild cardiomegaly with a degree of pulmonary venous hypertension. No adenopathy. No bone lesions. IMPRESSION: Cardiomegaly with pulmonary vascular congestion. Mild interstitial edema. Question a degree of congestive heart failure. No consolidation. Electronically Signed   By: Lowella Grip III M.D.   On: 05/16/2020 11:16    EKG: Independently reviewed.  Atrial fibrillation rapid ventricular rate  Assessment/Plan Principal Problem:   Acute on chronic combined systolic and diastolic CHF (congestive heart failure) (HCC) Active Problems:   Atrial fibrillation with RVR (HCC)   BPH (benign prostatic hyperplasia)   GERD (gastroesophageal reflux disease)   Hypertensive urgency    Acute on chronic combined systolic and diastolic dysfunction CHF Patient last known LVEF from a 2D echocardiogram in 2018 is 45% He presents for evaluation of shortness of breath and lower  extremity swelling and chest x-ray shows pulmonary edema Blood pressure was significantly elevated on admission Place patient on Lasix 40 mg IV every 12 Optimize blood pressure control Start patient on lisinopril 10 mg daily and continue metoprolol 25 mg daily with holding parameters Obtain 2D echocardiogram to assess LVEF We will request cardiology consult   A. fib with rapid ventricular rate (POA) Patient was started on a Cardizem drip with improvement in his heart rate We will discontinue Cardizem drip Continue amiodarone and metoprolol for rate control with holding parameters  Continue Eliquis as primary prophylaxis for an acute stroke.   Hypertensive urgency Most likely secondary to medication noncompliance Optimize blood pressure control Start patient on lisinopril 10 mg daily Continue metoprolol  Hypothyroidism Continue Synthroid   BPH Continue Flomax  DVT prophylaxis: Eliquis Code Status: Full Family Communication: Greater than 50% of time was spent discussing patient's condition and plan of care with him at the bedside.  All questions and concerns have been addressed. Disposition Plan: Back to previous home environment Consults called: Cardiology    Marijke Guadiana MD [facility]     05/16/2020, 12:07 PM

## 2020-05-17 ENCOUNTER — Inpatient Hospital Stay
Admit: 2020-05-17 | Discharge: 2020-05-17 | Disposition: A | Discharge status: Home / Self Care | Payer: PPO | Attending: Internal Medicine | Admitting: Internal Medicine

## 2020-05-17 DIAGNOSIS — N400 Enlarged prostate without lower urinary tract symptoms: Secondary | ICD-10-CM

## 2020-05-17 DIAGNOSIS — I10 Essential (primary) hypertension: Secondary | ICD-10-CM

## 2020-05-17 DIAGNOSIS — I480 Paroxysmal atrial fibrillation: Secondary | ICD-10-CM

## 2020-05-17 DIAGNOSIS — E039 Hypothyroidism, unspecified: Secondary | ICD-10-CM

## 2020-05-17 LAB — ECHOCARDIOGRAM COMPLETE
Height: 72 in
Weight: 3145.6 oz

## 2020-05-17 LAB — BASIC METABOLIC PANEL
Anion gap: 12 (ref 5–15)
BUN: 33 mg/dL — ABNORMAL HIGH (ref 8–23)
CO2: 28 mmol/L (ref 22–32)
Calcium: 8.5 mg/dL — ABNORMAL LOW (ref 8.9–10.3)
Chloride: 102 mmol/L (ref 98–111)
Creatinine, Ser: 1.06 mg/dL (ref 0.61–1.24)
GFR calc Af Amer: >60 mL/min (ref >60)
GFR calc non Af Amer: >60 mL/min (ref >60)
Glucose, Bld: 105 mg/dL — ABNORMAL HIGH (ref 70–99)
Potassium: 3.3 mmol/L — ABNORMAL LOW (ref 3.5–5.1)
Sodium: 142 mmol/L (ref 135–145)

## 2020-05-17 MED ORDER — POTASSIUM CHLORIDE CRYS ER 20 MEQ PO TBCR
20.0000 meq | EXTENDED_RELEASE_TABLET | Freq: Two times a day (BID) | ORAL | Status: DC
  Administered 2020-05-17 – 2020-05-18 (×3): 20 meq via ORAL
  Filled 2020-05-17 (×3): qty 1

## 2020-05-17 MED ORDER — METOPROLOL SUCCINATE ER 25 MG PO TB24
12.5000 mg | ORAL_TABLET | Freq: Every day | ORAL | Status: DC
  Administered 2020-05-18: 12.5 mg via ORAL
  Filled 2020-05-17: qty 1

## 2020-05-17 MED ORDER — AMIODARONE HCL 200 MG PO TABS
200.0000 mg | ORAL_TABLET | Freq: Every day | ORAL | Status: DC
  Administered 2020-05-18: 200 mg via ORAL
  Filled 2020-05-17: qty 1

## 2020-05-17 MED ORDER — LEVALBUTEROL HCL 1.25 MG/0.5ML IN NEBU
1.2500 mg | INHALATION_SOLUTION | Freq: Three times a day (TID) | RESPIRATORY_TRACT | Status: DC
  Administered 2020-05-17 – 2020-05-18 (×3): 1.25 mg via RESPIRATORY_TRACT
  Filled 2020-05-17 (×4): qty 0.5

## 2020-05-17 MED ORDER — FUROSEMIDE 10 MG/ML IJ SOLN
40.0000 mg | Freq: Two times a day (BID) | INTRAMUSCULAR | Status: DC
  Administered 2020-05-17: 40 mg via INTRAVENOUS
  Filled 2020-05-17: qty 4

## 2020-05-17 NOTE — Plan of Care (Signed)
  Problem: Clinical Measurements: Goal: Respiratory complications will improve Outcome: Progressing Goal: Cardiovascular complication will be avoided Outcome: Progressing   Problem: Pain Managment: Goal: General experience of comfort will improve Outcome: Progressing

## 2020-05-17 NOTE — Progress Notes (Signed)
Patient ID: Nathaniel Ibarra, male   DOB: September 29, 1941, 79 y.o.   MRN: [id number] [facility] PROGRESS NOTE  Nathaniel Ibarra [id number] DOB: [birth year] DOA: 05/16/2020 PCP: [name], MD  HPI/Subjective: Patient came in with fast heart rate with his heart rate in the 160s short of breath and swelling of his legs.  He states he stopped the Eliquis and been taking medications for his prostate and a sleeping medication.  He was wheezing last night and given a nebulizer treatment.  No history of smoking or occupational exposures.  Objective: Vitals:   05/17/20 0724 05/17/20 1045  BP: 104/63 (!) 110/59  Pulse: 80 (!) 55  Resp: 17 16  Temp: 98.1 F (36.7 C) 97.7 F (36.5 C)  SpO2: 90% 95%    Intake/Output Summary (Last 24 hours) at 05/17/2020 1236 Last data filed at 05/17/2020 0726 Gross per 24 hour  Intake 19.41 ml  Output 2310 ml  Net -2290.59 ml   Filed Weights   05/16/20 1045 05/16/20 1641 05/17/20 0358  Weight: 93.9 kg 90.4 kg 89.2 kg    ROS: Review of Systems  Constitutional: Negative for fever.  Eyes: Negative for blurred vision.  Respiratory: Positive for shortness of breath. Negative for cough.   Cardiovascular: Negative for chest pain.  Gastrointestinal: Negative for abdominal pain, nausea and vomiting.  Genitourinary: Negative for dysuria.  Musculoskeletal: Negative for joint pain.  Neurological: Negative for dizziness.   Exam: Physical Exam  Constitutional: He is oriented to person, place, and time.  HENT:  Nose: No mucosal edema.  Mouth/Throat: No oropharyngeal exudate or posterior oropharyngeal edema.  Eyes: Conjunctivae and lids are normal.  Neck: Carotid bruit is not present.  Cardiovascular: S1 normal and S2 normal. Exam reveals no gallop.  No murmur heard. Respiratory: No respiratory distress. He has decreased breath sounds in the right middle field, the right lower field, the left middle field and the left lower field. He has no  wheezes. He has rhonchi in the right lower field and the left lower field.  GI: Soft. Bowel sounds are normal. There is no abdominal tenderness.  Musculoskeletal:     Right ankle: Swelling present.     Left ankle: Swelling present.  Lymphadenopathy:    He has no cervical adenopathy.  Neurological: He is alert and oriented to person, place, and time. No cranial nerve deficit.  Skin: Skin is warm. No rash noted. Nails show no clubbing.  Psychiatric: He has a normal mood and affect.      Data Reviewed: Basic Metabolic Panel: Recent Labs  Lab 05/16/20 1104 05/17/20 0632  NA 142 142  K 3.9 3.3*  CL 111 102  CO2 25 28  GLUCOSE 112* 105*  BUN 37* 33*  CREATININE 1.03 1.06  CALCIUM 8.6* 8.5*   CBC: Recent Labs  Lab 05/16/20 1104  WBC 8.8  HGB 13.6  HCT 40.6  MCV 96.0  PLT 347   BNP (last 3 results) Recent Labs    05/16/20 1104  BNP 359.9*     Recent Results (from the past 240 hour(s))  SARS Coronavirus 2 by RT PCR (hospital order, performed in [facility] hospital lab) Nasopharyngeal Nasopharyngeal Swab     Status: None   Collection Time: 05/16/20 11:39 AM   Specimen: Nasopharyngeal Swab  Result Value Ref Range Status   SARS Coronavirus 2 NEGATIVE NEGATIVE Final    Comment: (NOTE) SARS-CoV-2 target nucleic acids are NOT DETECTED. The SARS-CoV-2 RNA is generally detectable in upper and lower  respiratory specimens during the acute phase of infection. The lowest concentration of SARS-CoV-2 viral copies this assay can detect is 250 copies / mL. A negative result does not preclude SARS-CoV-2 infection and should not be used as the sole basis for treatment or other patient management decisions.  A negative result may occur with improper specimen collection / handling, submission of specimen other than nasopharyngeal swab, presence of viral mutation(s) within the areas targeted by this assay, and inadequate number of viral copies (<250 copies / mL). A negative result  must be combined with clinical observations, patient history, and epidemiological information. Fact Sheet for Patients:   StrictlyIdeas.no Fact Sheet for Healthcare Providers: BankingDealers.co.za This test is not yet approved or cleared  by the [place] FDA and has been authorized for detection and/or diagnosis of SARS-CoV-2 by FDA under an Emergency Use Authorization (EUA).  This EUA will remain in effect (meaning this test can be used) for the duration of the COVID-19 declaration under Section 564(b)(1) of the Act, 21 U.S.C. section 360bbb-3(b)(1), unless the authorization is terminated or revoked sooner. Performed at [facility], [address] [place], [place] [address]      Studies: DG Chest Portable 1 View  Result Date: 05/16/2020 CLINICAL DATA:  Shortness of breath and lower extremity edema EXAM: PORTABLE CHEST 1 VIEW COMPARISON:  November 15, 2017 FINDINGS: There is mild interstitial pulmonary edema. No consolidation or pleural effusions. There is mild cardiomegaly with a degree of pulmonary venous hypertension. No adenopathy. No bone lesions. IMPRESSION: Cardiomegaly with pulmonary vascular congestion. Mild interstitial edema. Question a degree of congestive heart failure. No consolidation. Electronically Signed   By: Lowella Grip III M.D.   On: 05/16/2020 11:16    Scheduled Meds: . amiodarone  400 mg Oral Daily  . apixaban  5 mg Oral BID  . buPROPion  150 mg Oral Daily  . finasteride  5 mg Oral Daily  . furosemide  40 mg Intravenous BID  . levalbuterol  1.25 mg Nebulization Q8H  . levothyroxine  75 mcg Oral Q0600  . lisinopril  10 mg Oral Daily  . metoprolol succinate  25 mg Oral Daily  . pantoprazole  40 mg Oral Daily  . potassium chloride  20 mEq Oral BID  . sodium chloride flush  3 mL Intravenous Q12H  . tamsulosin  0.4 mg Oral BID   Continuous Infusions: . sodium chloride       Assessment/Plan:  1. Acute on chronic combined systolic and diastolic congestive heart failure.  Patient on IV Lasix, metoprolol and lisinopril.  Current echocardiogram done but not read yet. 2. Paroxysmal atrial fibrillation.  Have rapid heart rates of 160 on presentation on amiodarone and metoprolol.  Will decrease the dose of both of these since the patient now is in normal sinus rhythm and heart rate is on the lower side. 3. Essential hypertension on lisinopril and metoprolol. 4. Hypothyroidism unspecified on Synthroid 5. BPH on finasteride and Flomax 6. Insomnia on Restoril.  Code Status:     Code Status Orders  (From admission, onward)         Start     Ordered   05/16/20 1225  Full code  Continuous     05/16/20 1227        Code Status History    Date Active Date Inactive Code Status Order ID Comments User Context   11/12/2017 1859 11/17/2017 2048 Full Code [id number]  Idelle Crouch, MD Inpatient   09/01/2015 1337 09/02/2015  [place] Full Code [id number]  Yolonda Kida, MD Inpatient   09/01/2015 1331 09/01/2015 1337 Full Code [id number]  Dionisio David, MD Inpatient   09/01/2015 1254 09/01/2015 1331 Full Code [id number]  Dionisio David, MD Inpatient   08/16/2015 1631 08/17/2015 1627 Full Code [id number]  Henreitta Leber, MD Inpatient   Advance Care Planning Activity     Family Communication: Spoke with the patient's wife at the bedside Disposition Plan: Status is: Inpatient  Dispo: The patient is from: Home              Anticipated d/c is to: Home              Anticipated d/c date is: 05/18/2020              Patient currently on IV Lasix for heart failure.  Consultants:  Cardiology  Time spent: 28 minutes  [place]

## 2020-05-17 NOTE — TOC Progression Note (Signed)
Transition of Care [facility]) - Progression Note    Patient Details  Name: Nathaniel Ibarra MRN: [id number] Date of Birth: 1941/05/30  Transition of Care [facility]) CM/SW [place], LCSW Phone Number: 05/17/2020, 2:24 PM  Clinical Narrative:    [facility] consult in for Medication assistance, CSW met with patient, provided resources to aid in cost of medication, gave Pt Eloquis discount packet.        Expected Discharge Plan and Services                                                 Social Determinants of Health (SDOH) Interventions    Readmission Risk Interventions No flowsheet data found.

## 2020-05-18 DIAGNOSIS — I5023 Acute on chronic systolic (congestive) heart failure: Secondary | ICD-10-CM

## 2020-05-18 LAB — BASIC METABOLIC PANEL
Anion gap: 10 (ref 5–15)
BUN: 37 mg/dL — ABNORMAL HIGH (ref 8–23)
CO2: 28 mmol/L (ref 22–32)
Calcium: 8.2 mg/dL — ABNORMAL LOW (ref 8.9–10.3)
Chloride: 103 mmol/L (ref 98–111)
Creatinine, Ser: 1.23 mg/dL (ref 0.61–1.24)
GFR calc Af Amer: >60 mL/min (ref >60)
GFR calc non Af Amer: 56 mL/min — ABNORMAL LOW (ref >60)
Glucose, Bld: 131 mg/dL — ABNORMAL HIGH (ref 70–99)
Potassium: 3.6 mmol/L (ref 3.5–5.1)
Sodium: 141 mmol/L (ref 135–145)

## 2020-05-18 LAB — MAGNESIUM: Magnesium: 2.1 mg/dL (ref 1.7–2.4)

## 2020-05-18 MED ORDER — AMIODARONE HCL 200 MG PO TABS: 200.0000 mg | ORAL_TABLET | Freq: Every day | ORAL | 0 refills | Status: DC

## 2020-05-18 MED ORDER — FINASTERIDE 5 MG PO TABS: 5.0000 mg | ORAL_TABLET | Freq: Every day | ORAL | 0 refills | Status: DC

## 2020-05-18 MED ORDER — POTASSIUM CHLORIDE CRYS ER 20 MEQ PO TBCR: 20.0000 meq | EXTENDED_RELEASE_TABLET | Freq: Every day | ORAL | 0 refills | Status: DC

## 2020-05-18 MED ORDER — SPIRONOLACTONE 25 MG PO TABS: 12.5000 mg | ORAL_TABLET | Freq: Every day | ORAL | Status: DC

## 2020-05-18 MED ORDER — METOPROLOL SUCCINATE ER 25 MG PO TB24: 12.5000 mg | ORAL_TABLET | Freq: Every day | ORAL | 0 refills | Status: DC

## 2020-05-18 MED ORDER — FUROSEMIDE 40 MG PO TABS
40.0000 mg | ORAL_TABLET | Freq: Every day | ORAL | Status: DC
  Administered 2020-05-18: 40 mg via ORAL
  Filled 2020-05-18: qty 1

## 2020-05-18 MED ORDER — LOSARTAN POTASSIUM 50 MG PO TABS: 50.0000 mg | ORAL_TABLET | Freq: Every day | ORAL | 0 refills | Status: DC

## 2020-05-18 MED ORDER — SPIRONOLACTONE 25 MG PO TABS: 12.5000 mg | ORAL_TABLET | Freq: Every day | ORAL | 0 refills | Status: DC

## 2020-05-18 MED ORDER — LOSARTAN POTASSIUM 50 MG PO TABS
50.0000 mg | ORAL_TABLET | Freq: Every day | ORAL | Status: DC
  Administered 2020-05-18: 50 mg via ORAL
  Filled 2020-05-18: qty 1

## 2020-05-18 MED ORDER — FUROSEMIDE 40 MG PO TABS: 40.0000 mg | ORAL_TABLET | Freq: Every day | ORAL | 0 refills | Status: DC

## 2020-05-18 MED ORDER — APIXABAN 5 MG PO TABS: 5.0000 mg | ORAL_TABLET | Freq: Two times a day (BID) | ORAL | 0 refills | Status: DC

## 2020-05-18 NOTE — Discharge Summary (Signed)
Nathaniel Ibarra at [place] NAME: [name]    MR#:  [id number]  DATE OF BIRTH:  09-01-1941  DATE OF ADMISSION:  05/16/2020 ADMITTING PHYSICIAN: Collier Bullock, MD  DATE OF DISCHARGE: 05/18/2020  9:58 AM  PRIMARY CARE PHYSICIAN: [name], MD    ADMISSION DIAGNOSIS:  Atrial fibrillation with RVR (HCC) [I48.91] Acute on chronic combined systolic (congestive) and diastolic (congestive) heart failure (HCC) [I50.43] Acute on chronic congestive heart failure, unspecified heart failure type ([place]) [I50.9]  DISCHARGE DIAGNOSIS:  Principal Problem:   Acute on chronic combined systolic and diastolic CHF (congestive heart failure) (HCC) Active Problems:   Atrial fibrillation with RVR (HCC)   BPH (benign prostatic hyperplasia)   GERD (gastroesophageal reflux disease)   Hypertensive urgency   Acute on chronic combined systolic (congestive) and diastolic (congestive) heart failure (HCC)   AF (paroxysmal atrial fibrillation) ([place])   Essential hypertension   Hypothyroidism   SECONDARY DIAGNOSIS:   Past Medical History:  Diagnosis Date  . Bladder cancer ([place])   . BPH (benign prostatic hyperplasia)   . GERD (gastroesophageal reflux disease)   . Hypertension   . Myocardial infarction [facility])    cath with stent in 2016  . Thyroid disease     HOSPITAL COURSE:   1.  Acute on chronic systolic congestive heart failure.  Patient will be switched to oral Lasix, metoprolol and Cozaar upon going home.  Consider Entresto as outpatient.  Lungs were clear upon discharge home.  Recommend checking BMP as outpatient. 2.  Paroxysmal atrial fibrillation.  Patient did have a heart rate up at 160.  Placed on amiodarone and metoprolol.  Patient in normal sinus rhythm upon discharge.  Eliquis prescribed for anticoagulation.  Benefits and risks of blood thinner explained to the patient.  30-day free card given here. 3.  Essential hypertension.  On Cozaar, Lasix  and metoprolol 4.  Hypothyroidism unspecified on Synthroid 5.  BPH on finasteride and Flomax   DISCHARGE CONDITIONS:   Satisfactory   DRUG ALLERGIES:  No Known Allergies  DISCHARGE MEDICATIONS:   Allergies as of 05/18/2020   No Known Allergies     Medication List    STOP taking these medications   chlorthalidone 25 MG tablet Commonly known as: HYGROTON     TAKE these medications   amiodarone 200 MG tablet Commonly known as: Pacerone Take 1 tablet (200 mg total) by mouth daily. What changed: how much to take   apixaban 5 MG Tabs tablet Commonly known as: ELIQUIS Take 1 tablet (5 mg total) by mouth 2 (two) times daily.   citalopram 20 MG tablet Commonly known as: CELEXA Take 10 mg by mouth daily.   finasteride 5 MG tablet Commonly known as: PROSCAR Take 1 tablet (5 mg total) by mouth daily.   furosemide 40 MG tablet Commonly known as: LASIX Take 1 tablet (40 mg total) by mouth daily.   levothyroxine 100 MCG tablet Commonly known as: SYNTHROID Take 100 mcg by mouth daily.   losartan 50 MG tablet Commonly known as: COZAAR Take 1 tablet (50 mg total) by mouth daily.   metoprolol succinate 25 MG 24 hr tablet Commonly known as: TOPROL-XL Take 0.5 tablets (12.5 mg total) by mouth daily. What changed: how much to take   potassium chloride SA 20 MEQ tablet Commonly known as: KLOR-CON Take 1 tablet (20 mEq total) by mouth daily for 7 days.   spironolactone 25 MG tablet Commonly known as: ALDACTONE Take 0.5  tablets (12.5 mg total) by mouth daily. Start taking on: May 19, 2020   tamsulosin 0.4 MG Caps capsule Commonly known as: FLOMAX Take 0.8 mg by mouth at bedtime.   traZODone 50 MG tablet Commonly known as: DESYREL Take 50 mg by mouth daily.        DISCHARGE INSTRUCTIONS:   Follow-up 5 days PCP Follow-up 1 week cardiology  If you experience worsening of your admission symptoms, develop shortness of breath, life threatening emergency,  suicidal or homicidal thoughts you must seek medical attention immediately by calling 911 or calling your MD immediately  if symptoms less severe.  You Must read complete instructions/literature along with all the possible adverse reactions/side effects for all the Medicines you take and that have been prescribed to you. Take any new Medicines after you have completely understood and accept all the possible adverse reactions/side effects.   Please note  You were cared for by a hospitalist during your hospital stay. If you have any questions about your discharge medications or the care you received while you were in the hospital after you are discharged, you can call the unit and asked to speak with the hospitalist on call if the hospitalist that took care of you is not available. Once you are discharged, your primary care physician will handle any further medical issues. Please note that NO REFILLS for any discharge medications will be authorized once you are discharged, as it is imperative that you return to your primary care physician (or establish a relationship with a primary care physician if you do not have one) for your aftercare needs so that they can reassess your need for medications and monitor your lab values.    Today   CHIEF COMPLAINT:   Chief Complaint  Patient presents with  . Shortness of Breath  . Foot Swelling    HISTORY OF PRESENT ILLNESS:  Nathaniel Ibarra  is a 79 y.o. male came in with shortness of breath and foot swelling   VITAL SIGNS:  Blood pressure 118/71, pulse 60, temperature 98 F (36.7 C), resp. rate 18, height 6' (1.829 m), weight 89.5 kg, SpO2 95 %.  I/O:    Intake/Output Summary (Last 24 hours) at 05/18/2020 1521 Last data filed at 05/18/2020 0716 Gross per 24 hour  Intake --  Output 865 ml  Net -865 ml    PHYSICAL EXAMINATION:  GENERAL:  79 y.o.-year-old patient lying in the bed with no acute distress.  EYES: Pupils equal, round, reactive to  light and accommodation. No scleral icterus. HEENT: Head atraumatic, normocephalic. Oropharynx and nasopharynx clear.   LUNGS: Normal breath sounds bilaterally, no wheezing, rales,rhonchi or crepitation. No use of accessory muscles of respiration.  CARDIOVASCULAR: S1, S2 normal. No murmurs, rubs, or gallops.  ABDOMEN: Soft, non-tender, non-distended. Bowel sounds present. No organomegaly or mass.  EXTREMITIES: No pedal edema, cyanosis, or clubbing.  NEUROLOGIC: Cranial nerves II through XII are intact. Muscle strength 5/5 in all extremities. Sensation intact. Gait not checked.  PSYCHIATRIC: The patient is alert and oriented x 3.  SKIN: No obvious rash, lesion, or ulcer.   DATA REVIEW:   CBC Recent Labs  Lab 05/16/20 1104  WBC 8.8  HGB 13.6  HCT 40.6  PLT 347    Chemistries  Recent Labs  Lab 05/18/20 0446  NA 141  K 3.6  CL 103  CO2 28  GLUCOSE 131*  BUN 37*  CREATININE 1.23  CALCIUM 8.2*  MG 2.1    Cardiac Enzymes High-sensitivity troponin  24 and 24.  Microbiology Results  Results for orders placed or performed during the hospital encounter of 05/16/20  SARS Coronavirus 2 by RT PCR (hospital order, performed in [facility] hospital lab) Nasopharyngeal Nasopharyngeal Swab     Status: None   Collection Time: 05/16/20 11:39 AM   Specimen: Nasopharyngeal Swab  Result Value Ref Range Status   SARS Coronavirus 2 NEGATIVE NEGATIVE Final    Comment: (NOTE) SARS-CoV-2 target nucleic acids are NOT DETECTED. The SARS-CoV-2 RNA is generally detectable in upper and lower respiratory specimens during the acute phase of infection. The lowest concentration of SARS-CoV-2 viral copies this assay can detect is 250 copies / mL. A negative result does not preclude SARS-CoV-2 infection and should not be used as the sole basis for treatment or other patient management decisions.  A negative result may occur with improper specimen collection / handling, submission of specimen  other than nasopharyngeal swab, presence of viral mutation(s) within the areas targeted by this assay, and inadequate number of viral copies (<250 copies / mL). A negative result must be combined with clinical observations, patient history, and epidemiological information. Fact Sheet for Patients:   StrictlyIdeas.no Fact Sheet for Healthcare Providers: BankingDealers.co.za This test is not yet approved or cleared  by the [place] FDA and has been authorized for detection and/or diagnosis of SARS-CoV-2 by FDA under an Emergency Use Authorization (EUA).  This EUA will remain in effect (meaning this test can be used) for the duration of the COVID-19 declaration under Section 564(b)(1) of the Act, 21 U.S.C. section 360bbb-3(b)(1), unless the authorization is terminated or revoked sooner. Performed at [facility], [address] [place], [place] [address]     RADIOLOGY:  ECHOCARDIOGRAM COMPLETE  Result Date: 05/17/2020    ECHOCARDIOGRAM REPORT   Patient Name:   QUANG KUBES [facility] Date of Exam: 05/17/2020 Medical Rec #:  [id number]         Height:       72.0 in Accession #:    [id number]        Weight:       196.6 lb Date of Birth:  01/01/1941         BSA:          2.115 m Patient Age:    84 years          BP:           104/63 mmHg Patient Gender: M                 HR:           80 bpm. Exam Location:  [facility] Procedure: 2D Echo Indications:     CHF  History:         Patient has prior history of Echocardiogram examinations. CAD,                  Previous Myocardial Infarction and Stent; Arrythmias:Atrial                  Fibrillation.  Sonographer:     L Thornton-Maynard Referring Phys:  [place] Diagnosing Phys: Serafina Royals MD IMPRESSIONS  1. Left ventricular ejection fraction, by estimation, is 25 to 30%. The left ventricle has severely decreased function. The left ventricle demonstrates global hypokinesis. The left  ventricular internal cavity size was moderately dilated. There is mild left ventricular hypertrophy. Left ventricular diastolic parameters were normal.  2. Right ventricular systolic function is normal. The right ventricular size  is normal. There is mildly elevated pulmonary artery systolic pressure.  3. Left atrial size was mildly dilated.  4. Right atrial size was mildly dilated.  5. The mitral valve is normal in structure. Moderate mitral valve regurgitation.  6. The aortic valve is normal in structure. Aortic valve regurgitation is trivial. FINDINGS  Left Ventricle: Left ventricular ejection fraction, by estimation, is 25 to 30%. The left ventricle has severely decreased function. The left ventricle demonstrates global hypokinesis. The left ventricular internal cavity size was moderately dilated. There is mild left ventricular hypertrophy. Left ventricular diastolic parameters were normal. Right Ventricle: The right ventricular size is normal. No increase in right ventricular wall thickness. Right ventricular systolic function is normal. There is mildly elevated pulmonary artery systolic pressure. The tricuspid regurgitant velocity is 2.76  m/s, and with an assumed right atrial pressure of 10 mmHg, the estimated right ventricular systolic pressure is [id number] mmHg. Left Atrium: Left atrial size was mildly dilated. Right Atrium: Right atrial size was mildly dilated. Pericardium: There is no evidence of pericardial effusion. Mitral Valve: The mitral valve is normal in structure. Moderate mitral valve regurgitation. Tricuspid Valve: The tricuspid valve is normal in structure. Tricuspid valve regurgitation is mild. Aortic Valve: The aortic valve is normal in structure. Aortic valve regurgitation is trivial. Aortic valve mean gradient measures 3.0 mmHg. Aortic valve peak gradient measures 5.0 mmHg. Aortic valve area, by VTI measures 2.05 cm. Pulmonic Valve: The pulmonic valve was grossly normal. Pulmonic valve  regurgitation is not visualized. Aorta: The aortic root and ascending aorta are structurally normal, with no evidence of dilitation. IAS/Shunts: No atrial level shunt detected by color flow Doppler.  LEFT VENTRICLE PLAX 2D LVIDd:         5.65 cm  Diastology LVIDs:         4.43 cm  LV e' lateral:   7.05 cm/s LV PW:         1.30 cm  LV E/e' lateral: 13.8 LV IVS:        1.56 cm  LV e' medial:    5.44 cm/s LVOT diam:     2.20 cm  LV E/e' medial:  17.9 LV SV:         52 LV SV Index:   25 LVOT Area:     3.80 cm  RIGHT VENTRICLE RV S prime:     6.75 cm/s TAPSE (M-mode): 1.6 cm LEFT ATRIUM             Index LA diam:        4.40 cm 2.08 cm/m LA Vol (A2C):   78.6 ml 37.16 ml/m LA Vol (A4C):   84.7 ml 40.05 ml/m LA Biplane Vol: 82.8 ml 39.15 ml/m  AORTIC VALVE AV Area (Vmax):    2.35 cm AV Area (Vmean):   2.13 cm AV Area (VTI):     2.05 cm AV Vmax:           112.00 cm/s AV Vmean:          84.600 cm/s AV VTI:            0.256 m AV Peak Grad:      5.0 mmHg AV Mean Grad:      3.0 mmHg LVOT Vmax:         69.20 cm/s LVOT Vmean:        47.500 cm/s LVOT VTI:          0.138 m LVOT/AV VTI ratio: 0.54  AORTA Ao Root diam:  3.40 cm MITRAL VALVE               TRICUSPID VALVE MV Area (PHT): 3.99 cm    TR Peak grad:   30.5 mmHg MV Decel Time: 190 msec    TR Vmax:        276.00 cm/s MV E velocity: 97.30 cm/s                            SHUNTS                            Systemic VTI:  0.14 m                            Systemic Diam: 2.20 cm Serafina Royals MD Electronically signed by Serafina Royals MD Signature Date/Time: 05/17/2020/5:25:42 PM    Final      Management plans discussed with the patient, family and they are in agreement.  CODE STATUS:  Code Status History    Date Active Date Inactive Code Status Order ID Comments User Context   05/16/2020 1227 05/18/2020 1503 Full Code [id number]  Collier Bullock, MD ED   11/12/2017 1859 11/17/2017 2048 Full Code [id number]  Idelle Crouch, MD Inpatient   09/01/2015 1337 09/02/2015  1353 Full Code [id number]  Yolonda Kida, MD Inpatient   09/01/2015 1331 09/01/2015 1337 Full Code [id number]  Dionisio David, MD Inpatient   09/01/2015 1254 09/01/2015 1331 Full Code [id number]  Dionisio David, MD Inpatient   08/16/2015 1631 08/17/2015 1627 Full Code [id number]  Henreitta Leber, MD Inpatient   Advance Care Planning Activity      TOTAL TIME TAKING CARE OF THIS PATIENT: 35 minutes.    Loletha Grayer M.D on 05/18/2020 at 3:21 PM  Between 7am to 6pm - Pager - [phone]  After 6pm go to [email or web address] - password [email or web address]  [facility]  CC: Primary care physician; [name], MD

## 2020-05-18 NOTE — Progress Notes (Signed)
Patient educated on discharge instructions and verbalized understanding. Patients wife will be here to drive him home.

## 2020-05-18 NOTE — Discharge Instructions (Addendum)
Fluid restriction to 1500 ml per day Check BMP with follow up appointment

## 2020-05-21 ENCOUNTER — Telehealth: Payer: Self-pay | Admitting: Family

## 2020-05-21 NOTE — Telephone Encounter (Signed)
Spoke with patient who told he he is doing well since he got out of the hospital. He is a little week but has no symptoms, following a low sodium diet, and checking his weight daily. He has no problems with his medications and not exercising currently but plans to start. He confirmed his new patient CHF Clinic appointment with [place] for 6/9 at [place], [place]

## 2020-05-25 DIAGNOSIS — I2510 Atherosclerotic heart disease of native coronary artery without angina pectoris: Secondary | ICD-10-CM | POA: Diagnosis not present

## 2020-05-25 DIAGNOSIS — N400 Enlarged prostate without lower urinary tract symptoms: Secondary | ICD-10-CM | POA: Diagnosis not present

## 2020-05-25 DIAGNOSIS — I4891 Unspecified atrial fibrillation: Secondary | ICD-10-CM | POA: Diagnosis not present

## 2020-05-25 DIAGNOSIS — G4700 Insomnia, unspecified: Secondary | ICD-10-CM | POA: Diagnosis not present

## 2020-05-25 DIAGNOSIS — E039 Hypothyroidism, unspecified: Secondary | ICD-10-CM | POA: Diagnosis not present

## 2020-05-25 DIAGNOSIS — F321 Major depressive disorder, single episode, moderate: Secondary | ICD-10-CM | POA: Diagnosis not present

## 2020-05-26 NOTE — Progress Notes (Signed)
Patient ID: Nathaniel Ibarra, male    DOB: 03-13-1941, 79 y.o.   MRN: [id number]  HPI  Mr Koepp is a 79 y/o male with a history of HTN, thyroid disease, BMP, GERD and chronic heart failure.   Echo report from 05/17/20 reviewed and showed an EF of 25-30% along with mildly elevated PA pressure and moderate MR.   Catheterization done 11/14/17 showed:  Ost 1st Diag to 1st Diag lesion is 30% stenosed.  Prox Cx lesion is 40% stenosed.  Mid LAD lesion is 40% stenosed. No significant restenosis of stent and mild CAD and appears to have normal LVEF.  Admitted 05/16/20 due to acute on chronic heart failure. Initially given IV lasix with transition to oral diuretics. Given amiodarone and metoprolol for his atrial fibrillation. Discharged after 2 days.   He presents today for his initial visit with a chief complaint of minimal fatigue upon moderate exertion. He describes this as chronic in nature having been present for several years. He has associated ankle edema along with this. He denies any difficulty sleeping, dizziness, abdominal distention, palpitations, chest pain, shortness of breath, cough or weight gain.   Patient's wife that is present with him says that patient doesn't feel as good as he is saying. Has questions about how much fluids he should be drinking along with how much sodium he should have. His wife says that he's now taking 7 new medications and she's hoping that Dr. Ubaldo Glassing will be able to stop some in the future. She says that she's looked up the medications and feels like she knows what they are for.   Past Medical History:  Diagnosis Date  . Bladder cancer ([place])   . BPH (benign prostatic hyperplasia)   . CHF (congestive heart failure) ([place])   . GERD (gastroesophageal reflux disease)   . Hypertension   . Myocardial infarction [facility])    cath with stent in 2016  . Thyroid disease    Past Surgical History:  Procedure Laterality Date  . CARDIAC CATHETERIZATION N/A  09/01/2015   Procedure: Left Heart Cath and Coronary Angiography;  Surgeon: Dionisio David, MD;  Location: [place] [facility];  Service: Cardiovascular;  Laterality: N/A;  . CARDIAC CATHETERIZATION N/A 09/01/2015   Procedure: Coronary Stent Intervention;  Surgeon: Yolonda Kida, MD;  Location: [place] [facility];  Service: Cardiovascular;  Laterality: N/A;  . CATARACT EXTRACTION W/PHACO Left 11/23/2016   Procedure: CATARACT EXTRACTION PHACO AND INTRAOCULAR LENS PLACEMENT (IOC);  Surgeon: Leandrew Koyanagi, MD;  Location: [place];  Service: Ophthalmology;  Laterality: Left;  . CATARACT EXTRACTION W/PHACO Right 02/01/2017   Procedure: CATARACT EXTRACTION PHACO AND INTRAOCULAR LENS PLACEMENT ([place]) Complicated  right toric lens;  Surgeon: Leandrew Koyanagi, MD;  Location: [place];  Service: Ophthalmology;  Laterality: Right;  Malyugin toric Lens  . LEFT HEART CATH AND CORONARY ANGIOGRAPHY Right 11/14/2017   Procedure: LEFT HEART CATH AND CORONARY ANGIOGRAPHY;  Surgeon: Dionisio David, MD;  Location: [place] CV [facility];  Service: Cardiovascular;  Laterality: Right;   Family History  Problem Relation Age of Onset  . Emphysema Mother   . Emphysema Father    Social History   Tobacco Use  . Smoking status: Never Smoker  . Smokeless tobacco: Never Used  Substance Use Topics  . Alcohol use: No   No Known Allergies Prior to Admission medications   Medication Sig Start Date End Date Taking? Authorizing Provider  amiodarone (PACERONE) 200 MG tablet Take 1 tablet (  200 mg total) by mouth daily. 05/18/20  Yes Wieting, Richard, MD  apixaban (ELIQUIS) 5 MG TABS tablet Take 1 tablet (5 mg total) by mouth 2 (two) times daily. 05/18/20  Yes Wieting, Richard, MD  finasteride (PROSCAR) 5 MG tablet Take 1 tablet (5 mg total) by mouth daily. 05/18/20  Yes Wieting, Richard, MD  furosemide (LASIX) 40 MG tablet Take 1 tablet (40 mg total) by mouth daily. 05/18/20  Yes Wieting,  Richard, MD  levothyroxine (SYNTHROID) 100 MCG tablet Take 100 mcg by mouth daily. 04/27/20  Yes [provider]  losartan (COZAAR) 50 MG tablet Take 1 tablet (50 mg total) by mouth daily. 05/18/20  Yes Wieting, Richard, MD  metoprolol succinate (TOPROL-XL) 25 MG 24 hr tablet Take 0.5 tablets (12.5 mg total) by mouth daily. 05/18/20  Yes Wieting, Richard, MD  potassium chloride SA (KLOR-CON) 20 MEQ tablet Take 1 tablet (20 mEq total) by mouth daily for 7 days. 05/18/20 05/27/20 Yes Wieting, Richard, MD  spironolactone (ALDACTONE) 25 MG tablet Take 0.5 tablets (12.5 mg total) by mouth daily. 05/19/20  Yes Wieting, Richard, MD  tamsulosin (FLOMAX) 0.4 MG CAPS capsule Take 0.8 mg by mouth at bedtime.    Yes [provider]  traZODone (DESYREL) 50 MG tablet Take 50 mg by mouth daily. 03/17/20  Yes [provider]    Review of Systems  Constitutional: Positive for fatigue. Negative for appetite change.  HENT: Negative for congestion, rhinorrhea and sore throat.   Eyes: Negative.   Respiratory: Negative for cough and shortness of breath.   Cardiovascular: Positive for leg swelling (improving). Negative for chest pain and palpitations.  Gastrointestinal: Negative for abdominal distention and abdominal pain.  Endocrine: Negative.   Genitourinary: Negative.   Musculoskeletal: Negative for back pain and neck pain.  Skin: Negative.   Allergic/Immunologic: Negative.   Neurological: Negative for dizziness and light-headedness.  Hematological: Negative for adenopathy. Does not bruise/bleed easily.  Psychiatric/Behavioral: Negative for dysphoric mood and sleep disturbance (sleeping on 1 pillow). The patient is not nervous/anxious.     Vitals:   05/27/20 1024  BP: (!) 112/56  Pulse: (!) 52  Resp: 18  SpO2: 96%  Weight: 193 lb 8 oz (87.8 kg)  Height: 6' (1.829 m)   Wt Readings from Last 3 Encounters:  05/27/20 193 lb 8 oz (87.8 kg)  05/18/20 197 lb 4.8 oz (89.5 kg)  11/17/17  193 lb 9.6 oz (87.8 kg)   Lab Results  Component Value Date   CREATININE 1.23 05/18/2020   CREATININE 1.06 05/17/2020   CREATININE 1.03 05/16/2020    Physical Exam Vitals and nursing note reviewed.  Constitutional:      Appearance: Normal appearance.  HENT:     Head: Normocephalic and atraumatic.  Cardiovascular:     Rate and Rhythm: Regular rhythm. Bradycardia present.  Pulmonary:     Effort: Pulmonary effort is normal. No respiratory distress.     Breath sounds: No wheezing or rales.  Abdominal:     General: There is no distension.     Palpations: Abdomen is soft.     Tenderness: There is no abdominal tenderness.  Musculoskeletal:        General: No tenderness.     Cervical back: Normal range of motion and neck supple.     Right lower leg: No edema.     Left lower leg: No edema.  Skin:    General: Skin is warm and dry.  Neurological:     General: No focal  deficit present.     Mental Status: He is alert and oriented to person, place, and time.  Psychiatric:        Mood and Affect: Mood normal.        Behavior: Behavior normal.        Thought Content: Thought content normal.    Assessment & Plan:  1: Chronic heart failure with reduced ejection fraction- - NYHA class II - euvolemic today - weighing daily; reminded to call for an overnight weight gain of >2 pounds or a weekly weight gain of >5 pounds - not adding salt and instructed that he should follow a low sodium (~ 2000 mg) diet and written dietary information and a low sodium cookbook were given to patient - discussed that he could have ~ 60 ounces of fluid/ day but patient's wife feels like that amount may be too much for him; explained that he's taking both spironolactone & furosemide and that he needs to be drinking enough; they will clarify with Dr. Ubaldo Glassing  - saw cardiology ([facility]) 01/06/20 & returns later today - BP will not allow for changing losartan to entresto - bradycardic today so unable to titrate  metoprolol - BNP 05/16/20 was 359.9   2: HTN- - BP looks good today - saw PCP (Tegan-Sie) 05/25/20 - BMP 05/18/20 reviewed and showed sodium 141, potassium 3.6, creatinine 1.23 and GFR 56  3: Paroxysmal atrial fibrillation- - currently taking amiodarone and apixaban   Patient did not bring his medications nor a list. Each medication was verbally reviewed with the patient and he was encouraged to bring the bottles to every visit to confirm accuracy of list.  Patient and wife opt to not make a return appointment at this time. Advised both of them that he could return at anytime and patient was comfortable with this.

## 2020-05-27 ENCOUNTER — Other Ambulatory Visit
Admission: RE | Admit: 2020-05-27 | Discharge: 2020-05-27 | Disposition: A | Discharge status: Home / Self Care | Payer: PPO | Source: Ambulatory Visit | Attending: Cardiology | Admitting: Cardiology

## 2020-05-27 ENCOUNTER — Ambulatory Visit: Payer: PPO | Admitting: Family

## 2020-05-27 ENCOUNTER — Encounter: Payer: Self-pay | Admitting: Family

## 2020-05-27 ENCOUNTER — Other Ambulatory Visit: Payer: Self-pay

## 2020-05-27 VITALS — BP 112/56 | HR 52 | Resp 18 | Ht 72.0 in | Wt 193.5 lb

## 2020-05-27 DIAGNOSIS — I429 Cardiomyopathy, unspecified: Secondary | ICD-10-CM | POA: Insufficient documentation

## 2020-05-27 DIAGNOSIS — I5022 Chronic systolic (congestive) heart failure: Secondary | ICD-10-CM | POA: Insufficient documentation

## 2020-05-27 DIAGNOSIS — Z8551 Personal history of malignant neoplasm of bladder: Secondary | ICD-10-CM | POA: Insufficient documentation

## 2020-05-27 DIAGNOSIS — I10 Essential (primary) hypertension: Secondary | ICD-10-CM | POA: Diagnosis not present

## 2020-05-27 DIAGNOSIS — Z955 Presence of coronary angioplasty implant and graft: Secondary | ICD-10-CM | POA: Insufficient documentation

## 2020-05-27 DIAGNOSIS — I252 Old myocardial infarction: Secondary | ICD-10-CM | POA: Insufficient documentation

## 2020-05-27 DIAGNOSIS — I480 Paroxysmal atrial fibrillation: Secondary | ICD-10-CM

## 2020-05-27 DIAGNOSIS — K219 Gastro-esophageal reflux disease without esophagitis: Secondary | ICD-10-CM | POA: Insufficient documentation

## 2020-05-27 DIAGNOSIS — E079 Disorder of thyroid, unspecified: Secondary | ICD-10-CM | POA: Insufficient documentation

## 2020-05-27 DIAGNOSIS — R001 Bradycardia, unspecified: Secondary | ICD-10-CM | POA: Diagnosis not present

## 2020-05-27 DIAGNOSIS — I110 Hypertensive heart disease with heart failure: Secondary | ICD-10-CM | POA: Insufficient documentation

## 2020-05-27 DIAGNOSIS — I447 Left bundle-branch block, unspecified: Secondary | ICD-10-CM | POA: Diagnosis not present

## 2020-05-27 DIAGNOSIS — Z79899 Other long term (current) drug therapy: Secondary | ICD-10-CM | POA: Insufficient documentation

## 2020-05-27 DIAGNOSIS — I2510 Atherosclerotic heart disease of native coronary artery without angina pectoris: Secondary | ICD-10-CM | POA: Insufficient documentation

## 2020-05-27 DIAGNOSIS — N400 Enlarged prostate without lower urinary tract symptoms: Secondary | ICD-10-CM | POA: Insufficient documentation

## 2020-05-27 DIAGNOSIS — Z825 Family history of asthma and other chronic lower respiratory diseases: Secondary | ICD-10-CM | POA: Insufficient documentation

## 2020-05-27 DIAGNOSIS — Z7901 Long term (current) use of anticoagulants: Secondary | ICD-10-CM | POA: Insufficient documentation

## 2020-05-27 DIAGNOSIS — Z79890 Hormone replacement therapy (postmenopausal): Secondary | ICD-10-CM | POA: Insufficient documentation

## 2020-05-27 DIAGNOSIS — Z8679 Personal history of other diseases of the circulatory system: Secondary | ICD-10-CM | POA: Diagnosis not present

## 2020-05-27 LAB — BRAIN NATRIURETIC PEPTIDE: B Natriuretic Peptide: 208.7 pg/mL — ABNORMAL HIGH (ref 0.0–100.0)

## 2020-05-27 NOTE — Patient Instructions (Addendum)
Continue weighing daily and call for an overnight weight gain of > 2 pounds or a weekly weight gain of >5 pounds.   Drink about 60 ounces of fluids/ day

## 2020-06-11 DIAGNOSIS — Z8679 Personal history of other diseases of the circulatory system: Secondary | ICD-10-CM | POA: Diagnosis not present

## 2020-06-11 DIAGNOSIS — I2510 Atherosclerotic heart disease of native coronary artery without angina pectoris: Secondary | ICD-10-CM | POA: Diagnosis not present

## 2020-06-11 DIAGNOSIS — I4891 Unspecified atrial fibrillation: Secondary | ICD-10-CM | POA: Diagnosis not present

## 2020-06-11 DIAGNOSIS — R001 Bradycardia, unspecified: Secondary | ICD-10-CM | POA: Diagnosis not present

## 2020-06-11 DIAGNOSIS — I447 Left bundle-branch block, unspecified: Secondary | ICD-10-CM | POA: Diagnosis not present

## 2020-06-11 DIAGNOSIS — F4321 Adjustment disorder with depressed mood: Secondary | ICD-10-CM | POA: Diagnosis not present

## 2020-06-11 DIAGNOSIS — I10 Essential (primary) hypertension: Secondary | ICD-10-CM | POA: Diagnosis not present

## 2020-06-30 DIAGNOSIS — Z8679 Personal history of other diseases of the circulatory system: Secondary | ICD-10-CM | POA: Diagnosis not present

## 2020-07-16 DIAGNOSIS — I2510 Atherosclerotic heart disease of native coronary artery without angina pectoris: Secondary | ICD-10-CM | POA: Diagnosis not present

## 2020-07-20 DIAGNOSIS — Z729 Problem related to lifestyle, unspecified: Secondary | ICD-10-CM | POA: Diagnosis not present

## 2020-07-20 DIAGNOSIS — I2510 Atherosclerotic heart disease of native coronary artery without angina pectoris: Secondary | ICD-10-CM | POA: Diagnosis not present

## 2020-07-20 DIAGNOSIS — M13862 Other specified arthritis, left knee: Secondary | ICD-10-CM | POA: Diagnosis not present

## 2020-07-20 DIAGNOSIS — F321 Major depressive disorder, single episode, moderate: Secondary | ICD-10-CM | POA: Diagnosis not present

## 2020-07-20 DIAGNOSIS — G4700 Insomnia, unspecified: Secondary | ICD-10-CM | POA: Diagnosis not present

## 2020-07-20 DIAGNOSIS — E039 Hypothyroidism, unspecified: Secondary | ICD-10-CM | POA: Diagnosis not present

## 2020-07-20 DIAGNOSIS — Z0001 Encounter for general adult medical examination with abnormal findings: Secondary | ICD-10-CM | POA: Diagnosis not present

## 2020-07-20 DIAGNOSIS — I4891 Unspecified atrial fibrillation: Secondary | ICD-10-CM | POA: Diagnosis not present

## 2020-07-20 DIAGNOSIS — Z1331 Encounter for screening for depression: Secondary | ICD-10-CM | POA: Diagnosis not present

## 2020-07-20 DIAGNOSIS — N400 Enlarged prostate without lower urinary tract symptoms: Secondary | ICD-10-CM | POA: Diagnosis not present

## 2020-07-20 DIAGNOSIS — Z1389 Encounter for screening for other disorder: Secondary | ICD-10-CM | POA: Diagnosis not present

## 2020-07-21 DIAGNOSIS — I480 Paroxysmal atrial fibrillation: Secondary | ICD-10-CM | POA: Diagnosis not present

## 2020-07-21 DIAGNOSIS — I160 Hypertensive urgency: Secondary | ICD-10-CM | POA: Diagnosis not present

## 2020-07-21 DIAGNOSIS — I2510 Atherosclerotic heart disease of native coronary artery without angina pectoris: Secondary | ICD-10-CM | POA: Diagnosis not present

## 2020-07-21 DIAGNOSIS — I10 Essential (primary) hypertension: Secondary | ICD-10-CM | POA: Diagnosis not present

## 2020-07-21 DIAGNOSIS — I447 Left bundle-branch block, unspecified: Secondary | ICD-10-CM | POA: Diagnosis not present

## 2020-07-21 DIAGNOSIS — I5043 Acute on chronic combined systolic (congestive) and diastolic (congestive) heart failure: Secondary | ICD-10-CM | POA: Diagnosis not present

## 2020-08-17 DIAGNOSIS — Z8551 Personal history of malignant neoplasm of bladder: Secondary | ICD-10-CM | POA: Diagnosis not present

## 2020-08-17 DIAGNOSIS — R3915 Urgency of urination: Secondary | ICD-10-CM | POA: Diagnosis not present

## 2020-08-17 DIAGNOSIS — N401 Enlarged prostate with lower urinary tract symptoms: Secondary | ICD-10-CM | POA: Diagnosis not present

## 2020-08-17 DIAGNOSIS — R350 Frequency of micturition: Secondary | ICD-10-CM | POA: Diagnosis not present

## 2020-08-17 DIAGNOSIS — R3912 Poor urinary stream: Secondary | ICD-10-CM | POA: Diagnosis not present

## 2020-08-31 DIAGNOSIS — R3915 Urgency of urination: Secondary | ICD-10-CM | POA: Diagnosis not present

## 2020-08-31 DIAGNOSIS — R3912 Poor urinary stream: Secondary | ICD-10-CM | POA: Diagnosis not present

## 2020-08-31 DIAGNOSIS — Z8551 Personal history of malignant neoplasm of bladder: Secondary | ICD-10-CM | POA: Diagnosis not present

## 2020-08-31 DIAGNOSIS — N401 Enlarged prostate with lower urinary tract symptoms: Secondary | ICD-10-CM | POA: Diagnosis not present

## 2020-08-31 DIAGNOSIS — R350 Frequency of micturition: Secondary | ICD-10-CM | POA: Diagnosis not present

## 2020-09-23 DIAGNOSIS — N401 Enlarged prostate with lower urinary tract symptoms: Secondary | ICD-10-CM | POA: Diagnosis not present

## 2020-09-23 DIAGNOSIS — R350 Frequency of micturition: Secondary | ICD-10-CM | POA: Diagnosis not present

## 2020-10-21 DIAGNOSIS — I480 Paroxysmal atrial fibrillation: Secondary | ICD-10-CM | POA: Diagnosis not present

## 2020-10-21 DIAGNOSIS — I5043 Acute on chronic combined systolic (congestive) and diastolic (congestive) heart failure: Secondary | ICD-10-CM | POA: Diagnosis not present

## 2020-10-21 DIAGNOSIS — I10 Essential (primary) hypertension: Secondary | ICD-10-CM | POA: Diagnosis not present

## 2020-10-21 DIAGNOSIS — I160 Hypertensive urgency: Secondary | ICD-10-CM | POA: Diagnosis not present

## 2020-10-21 DIAGNOSIS — F4321 Adjustment disorder with depressed mood: Secondary | ICD-10-CM | POA: Diagnosis not present

## 2020-10-21 DIAGNOSIS — I2510 Atherosclerotic heart disease of native coronary artery without angina pectoris: Secondary | ICD-10-CM | POA: Diagnosis not present

## 2020-10-21 DIAGNOSIS — R001 Bradycardia, unspecified: Secondary | ICD-10-CM | POA: Diagnosis not present

## 2020-10-21 DIAGNOSIS — I447 Left bundle-branch block, unspecified: Secondary | ICD-10-CM | POA: Diagnosis not present

## 2020-10-26 DIAGNOSIS — R3912 Poor urinary stream: Secondary | ICD-10-CM | POA: Diagnosis not present

## 2020-10-26 DIAGNOSIS — N401 Enlarged prostate with lower urinary tract symptoms: Secondary | ICD-10-CM | POA: Diagnosis not present

## 2020-11-16 DIAGNOSIS — N453 Epididymo-orchitis: Secondary | ICD-10-CM | POA: Diagnosis not present

## 2020-11-16 DIAGNOSIS — R8271 Bacteriuria: Secondary | ICD-10-CM | POA: Diagnosis not present

## 2020-11-19 DIAGNOSIS — N453 Epididymo-orchitis: Secondary | ICD-10-CM | POA: Diagnosis not present

## 2020-11-19 DIAGNOSIS — N3000 Acute cystitis without hematuria: Secondary | ICD-10-CM | POA: Diagnosis not present

## 2020-11-21 ENCOUNTER — Encounter (HOSPITAL_COMMUNITY): Payer: Self-pay

## 2020-11-21 ENCOUNTER — Other Ambulatory Visit: Payer: Self-pay

## 2020-11-21 ENCOUNTER — Emergency Department (HOSPITAL_COMMUNITY): Payer: PPO

## 2020-11-21 ENCOUNTER — Inpatient Hospital Stay (HOSPITAL_COMMUNITY)
Admission: EM | Admit: 2020-11-21 | Discharge: 2020-11-27 | DRG: 711 | Disposition: A | Discharge status: Home / Self Care | Payer: PPO | Attending: Internal Medicine | Admitting: Internal Medicine

## 2020-11-21 DIAGNOSIS — J189 Pneumonia, unspecified organism: Secondary | ICD-10-CM | POA: Diagnosis not present

## 2020-11-21 DIAGNOSIS — J9601 Acute respiratory failure with hypoxia: Secondary | ICD-10-CM | POA: Diagnosis not present

## 2020-11-21 DIAGNOSIS — I5023 Acute on chronic systolic (congestive) heart failure: Secondary | ICD-10-CM | POA: Diagnosis not present

## 2020-11-21 DIAGNOSIS — I10 Essential (primary) hypertension: Secondary | ICD-10-CM | POA: Diagnosis not present

## 2020-11-21 DIAGNOSIS — I110 Hypertensive heart disease with heart failure: Secondary | ICD-10-CM | POA: Diagnosis present

## 2020-11-21 DIAGNOSIS — I2510 Atherosclerotic heart disease of native coronary artery without angina pectoris: Secondary | ICD-10-CM | POA: Diagnosis present

## 2020-11-21 DIAGNOSIS — Z79899 Other long term (current) drug therapy: Secondary | ICD-10-CM | POA: Diagnosis not present

## 2020-11-21 DIAGNOSIS — N451 Epididymitis: Secondary | ICD-10-CM | POA: Diagnosis not present

## 2020-11-21 DIAGNOSIS — B965 Pseudomonas (aeruginosa) (mallei) (pseudomallei) as the cause of diseases classified elsewhere: Secondary | ICD-10-CM | POA: Diagnosis not present

## 2020-11-21 DIAGNOSIS — Z20822 Contact with and (suspected) exposure to covid-19: Secondary | ICD-10-CM | POA: Diagnosis present

## 2020-11-21 DIAGNOSIS — L989 Disorder of the skin and subcutaneous tissue, unspecified: Secondary | ICD-10-CM | POA: Diagnosis not present

## 2020-11-21 DIAGNOSIS — N4611 Organic oligospermia: Secondary | ICD-10-CM | POA: Diagnosis not present

## 2020-11-21 DIAGNOSIS — N390 Urinary tract infection, site not specified: Secondary | ICD-10-CM | POA: Diagnosis not present

## 2020-11-21 DIAGNOSIS — I5022 Chronic systolic (congestive) heart failure: Secondary | ICD-10-CM | POA: Diagnosis present

## 2020-11-21 DIAGNOSIS — N433 Hydrocele, unspecified: Secondary | ICD-10-CM | POA: Diagnosis present

## 2020-11-21 DIAGNOSIS — N453 Epididymo-orchitis: Secondary | ICD-10-CM | POA: Diagnosis present

## 2020-11-21 DIAGNOSIS — I480 Paroxysmal atrial fibrillation: Secondary | ICD-10-CM | POA: Diagnosis present

## 2020-11-21 DIAGNOSIS — N454 Abscess of epididymis or testis: Principal | ICD-10-CM | POA: Diagnosis present

## 2020-11-21 DIAGNOSIS — Z79890 Hormone replacement therapy (postmenopausal): Secondary | ICD-10-CM | POA: Diagnosis not present

## 2020-11-21 DIAGNOSIS — Z955 Presence of coronary angioplasty implant and graft: Secondary | ICD-10-CM

## 2020-11-21 DIAGNOSIS — I252 Old myocardial infarction: Secondary | ICD-10-CM | POA: Diagnosis not present

## 2020-11-21 DIAGNOSIS — K219 Gastro-esophageal reflux disease without esophagitis: Secondary | ICD-10-CM | POA: Diagnosis not present

## 2020-11-21 DIAGNOSIS — B9689 Other specified bacterial agents as the cause of diseases classified elsewhere: Secondary | ICD-10-CM | POA: Diagnosis present

## 2020-11-21 DIAGNOSIS — N400 Enlarged prostate without lower urinary tract symptoms: Secondary | ICD-10-CM | POA: Diagnosis not present

## 2020-11-21 DIAGNOSIS — Z7901 Long term (current) use of anticoagulants: Secondary | ICD-10-CM

## 2020-11-21 DIAGNOSIS — I5043 Acute on chronic combined systolic (congestive) and diastolic (congestive) heart failure: Secondary | ICD-10-CM | POA: Diagnosis not present

## 2020-11-21 DIAGNOSIS — E039 Hypothyroidism, unspecified: Secondary | ICD-10-CM | POA: Diagnosis present

## 2020-11-21 DIAGNOSIS — N503 Cyst of epididymis: Secondary | ICD-10-CM | POA: Diagnosis not present

## 2020-11-21 DIAGNOSIS — R0602 Shortness of breath: Secondary | ICD-10-CM

## 2020-11-21 DIAGNOSIS — N5089 Other specified disorders of the male genital organs: Secondary | ICD-10-CM | POA: Diagnosis not present

## 2020-11-21 DIAGNOSIS — Z8551 Personal history of malignant neoplasm of bladder: Secondary | ICD-10-CM | POA: Diagnosis not present

## 2020-11-21 DIAGNOSIS — N492 Inflammatory disorders of scrotum: Secondary | ICD-10-CM | POA: Diagnosis not present

## 2020-11-21 LAB — RESP PANEL BY RT-PCR (FLU A&B, COVID) ARPGX2
Influenza A by PCR: NEGATIVE
Influenza B by PCR: NEGATIVE
SARS Coronavirus 2 by RT PCR: NEGATIVE

## 2020-11-21 LAB — URINALYSIS, ROUTINE W REFLEX MICROSCOPIC
Bilirubin Urine: NEGATIVE
Glucose, UA: NEGATIVE mg/dL
Ketones, ur: NEGATIVE mg/dL
Nitrite: NEGATIVE
Protein, ur: NEGATIVE mg/dL
Specific Gravity, Urine: 1.016 (ref 1.005–1.030)
WBC, UA: >50 WBC/hpf — ABNORMAL HIGH (ref 0–5)
pH: 5 (ref 5.0–8.0)

## 2020-11-21 LAB — COMPREHENSIVE METABOLIC PANEL
ALT: 20 U/L (ref 0–44)
AST: 21 U/L (ref 15–41)
Albumin: 3.3 g/dL — ABNORMAL LOW (ref 3.5–5.0)
Alkaline Phosphatase: 78 U/L (ref 38–126)
Anion gap: 8 (ref 5–15)
BUN: 25 mg/dL — ABNORMAL HIGH (ref 8–23)
CO2: 26 mmol/L (ref 22–32)
Calcium: 8.4 mg/dL — ABNORMAL LOW (ref 8.9–10.3)
Chloride: 102 mmol/L (ref 98–111)
Creatinine, Ser: 0.92 mg/dL (ref 0.61–1.24)
GFR, Estimated: >60 mL/min (ref >60)
Glucose, Bld: 163 mg/dL — ABNORMAL HIGH (ref 70–99)
Potassium: 3.7 mmol/L (ref 3.5–5.1)
Sodium: 136 mmol/L (ref 135–145)
Total Bilirubin: 0.6 mg/dL (ref 0.3–1.2)
Total Protein: 6.8 g/dL (ref 6.5–8.1)

## 2020-11-21 LAB — CBC WITH DIFFERENTIAL/PLATELET
Abs Immature Granulocytes: 0.15 10*3/uL — ABNORMAL HIGH (ref 0.00–0.07)
Basophils Absolute: 0 10*3/uL (ref 0.0–0.1)
Basophils Relative: 0 %
Eosinophils Absolute: 0.1 10*3/uL (ref 0.0–0.5)
Eosinophils Relative: 0 %
HCT: 40.6 % (ref 39.0–52.0)
Hemoglobin: 13 g/dL (ref 13.0–17.0)
Immature Granulocytes: 1 %
Lymphocytes Relative: 6 %
Lymphs Abs: 1 10*3/uL (ref 0.7–4.0)
MCH: 31.6 pg (ref 26.0–34.0)
MCHC: 32 g/dL (ref 30.0–36.0)
MCV: 98.5 fL (ref 80.0–100.0)
Monocytes Absolute: 1.2 10*3/uL — ABNORMAL HIGH (ref 0.1–1.0)
Monocytes Relative: 8 %
Neutro Abs: 13.3 10*3/uL — ABNORMAL HIGH (ref 1.7–7.7)
Neutrophils Relative %: 85 %
Platelets: 359 10*3/uL (ref 150–400)
RBC: 4.12 MIL/uL — ABNORMAL LOW (ref 4.22–5.81)
RDW: 11.9 % (ref 11.5–15.5)
WBC: 15.7 10*3/uL — ABNORMAL HIGH (ref 4.0–10.5)
nRBC: 0 % (ref 0.0–0.2)

## 2020-11-21 LAB — LACTIC ACID, PLASMA: Lactic Acid, Venous: <0.3 mmol/L — ABNORMAL LOW (ref 0.5–1.9)

## 2020-11-21 MED ORDER — MORPHINE SULFATE (PF) 2 MG/ML IV SOLN
2.0000 mg | INTRAVENOUS | Status: DC | PRN
  Administered 2020-11-21 – 2020-11-23 (×2): 2 mg via INTRAVENOUS
  Filled 2020-11-21 (×2): qty 1

## 2020-11-21 MED ORDER — ONDANSETRON HCL 4 MG PO TABS
4.0000 mg | ORAL_TABLET | Freq: Four times a day (QID) | ORAL | Status: DC | PRN
  Administered 2020-11-26: 4 mg via ORAL
  Filled 2020-11-21: qty 1

## 2020-11-21 MED ORDER — SODIUM CHLORIDE 0.9 % IV SOLN
2.0000 g | Freq: Once | INTRAVENOUS | Status: AC
  Administered 2020-11-21: 2 g via INTRAVENOUS
  Filled 2020-11-21: qty 20

## 2020-11-21 MED ORDER — ONDANSETRON HCL 4 MG/2ML IJ SOLN
4.0000 mg | Freq: Four times a day (QID) | INTRAMUSCULAR | Status: DC | PRN
  Administered 2020-11-21 – 2020-11-23 (×2): 4 mg via INTRAVENOUS
  Filled 2020-11-21 (×2): qty 2

## 2020-11-21 MED ORDER — ACETAMINOPHEN 650 MG RE SUPP: 650.0000 mg | Freq: Four times a day (QID) | RECTAL | Status: DC | PRN

## 2020-11-21 MED ORDER — SODIUM CHLORIDE 0.9 % IV SOLN
2.0000 g | INTRAVENOUS | Status: DC
  Administered 2020-11-22: 2 g via INTRAVENOUS
  Filled 2020-11-21: qty 2

## 2020-11-21 MED ORDER — ACETAMINOPHEN 325 MG PO TABS
650.0000 mg | ORAL_TABLET | Freq: Four times a day (QID) | ORAL | Status: DC | PRN
  Administered 2020-11-23 – 2020-11-24 (×2): 650 mg via ORAL
  Filled 2020-11-21 (×3): qty 2

## 2020-11-21 NOTE — H&P (Signed)
History and Physical    Nathaniel Ibarra AST:[id number] DOB: Oct 11, 1941 DOA: 11/21/2020  PCP: [name], MD  Patient coming from: Home  I have personally briefly reviewed patient's old medical records in [place]  Chief Complaint: Dysuria  HPI: Nathaniel Ibarra is a 79 y.o. male with medical history significant of HFrEF (25-30% EF), CAD, MI s/p stent, HTN, bladder CA, PAF on eliquis.  Pt recently had prostate surgery Nov 8 by Dr. [name].  He is unsure what exactly was done.  Had catheter in place for a week.  Around a week after that developed scrotal and penile swelling.  Seen on Monday 11/29 at urology and put on Keflex, cultures obtained.  Seen again on Wed at urology, urine had grown serratia that was resistant to Keflex, pt was given shot of IM rocephin and put on cefpodoxime.  Also had [place] to make sure he was emptying bladder.  Symptoms have persisted / worsened since that time, now presents to ED.  No fevers, chills, no urinary retention.   ED Course: UA with findings for UTI.  Scrotal US shows testicular abscess.  EDP spoke with Dr. Al Pimple who recd admission to hospital, agreed with rocephin, keep NPO after MN and urology will see in AM, possibly take to OR for I+D.   Review of Systems: As per HPI, otherwise all review of systems negative.  Past Medical History:  Diagnosis Date  . Bladder cancer ([place])   . BPH (benign prostatic hyperplasia)   . CHF (congestive heart failure) ([place])   . GERD (gastroesophageal reflux disease)   . Hypertension   . Myocardial infarction [facility])    cath with stent in 2016  . Thyroid disease     Past Surgical History:  Procedure Laterality Date  . CARDIAC CATHETERIZATION N/A 09/01/2015   Procedure: Left Heart Cath and Coronary Angiography;  Surgeon: Dionisio David, MD;  Location: [place] [facility];  Service: Cardiovascular;  Laterality: N/A;  . CARDIAC CATHETERIZATION N/A 09/01/2015   Procedure: Coronary Stent Intervention;   Surgeon: Yolonda Kida, MD;  Location: [place] [facility];  Service: Cardiovascular;  Laterality: N/A;  . CATARACT EXTRACTION W/PHACO Left 11/23/2016   Procedure: CATARACT EXTRACTION PHACO AND INTRAOCULAR LENS PLACEMENT (IOC);  Surgeon: Leandrew Koyanagi, MD;  Location: [place];  Service: Ophthalmology;  Laterality: Left;  . CATARACT EXTRACTION W/PHACO Right 02/01/2017   Procedure: CATARACT EXTRACTION PHACO AND INTRAOCULAR LENS PLACEMENT ([place]) Complicated  right toric lens;  Surgeon: Leandrew Koyanagi, MD;  Location: [place];  Service: Ophthalmology;  Laterality: Right;  Malyugin toric Lens  . LEFT HEART CATH AND CORONARY ANGIOGRAPHY Right 11/14/2017   Procedure: LEFT HEART CATH AND CORONARY ANGIOGRAPHY;  Surgeon: Dionisio David, MD;  Location: [place] [facility];  Service: Cardiovascular;  Laterality: Right;     reports that he has never smoked. He has never used smokeless tobacco. He reports that he does not drink alcohol and does not use drugs.  No Known Allergies  Family History  Problem Relation Age of Onset  . Emphysema Mother   . Emphysema Father      Prior to Admission medications   Medication Sig Start Date End Date Taking? Authorizing Provider  amiodarone (PACERONE) 200 MG tablet Take 1 tablet (200 mg total) by mouth daily. 05/18/20   Loletha Grayer, MD  apixaban (ELIQUIS) 5 MG TABS tablet Take 1 tablet (5 mg total) by mouth 2 (two) times daily. 05/18/20   Wieting, Richard,  MD  finasteride (PROSCAR) 5 MG tablet Take 1 tablet (5 mg total) by mouth daily. 05/18/20   Loletha Grayer, MD  furosemide (LASIX) 40 MG tablet Take 1 tablet (40 mg total) by mouth daily. 05/18/20   Loletha Grayer, MD  levothyroxine (SYNTHROID) 100 MCG tablet Take 100 mcg by mouth daily. 04/27/20   [provider]  losartan (COZAAR) 50 MG tablet Take 1 tablet (50 mg total) by mouth daily. 05/18/20   Loletha Grayer, MD  metoprolol succinate (TOPROL-XL) 25 MG 24  hr tablet Take 0.5 tablets (12.5 mg total) by mouth daily. 05/18/20   Loletha Grayer, MD  potassium chloride SA (KLOR-CON) 20 MEQ tablet Take 1 tablet (20 mEq total) by mouth daily for 7 days. 05/18/20 05/27/20  Loletha Grayer, MD  spironolactone (ALDACTONE) 25 MG tablet Take 0.5 tablets (12.5 mg total) by mouth daily. 05/19/20   Loletha Grayer, MD  tamsulosin (FLOMAX) 0.4 MG CAPS capsule Take 0.8 mg by mouth at bedtime.     [provider]  traZODone (DESYREL) 50 MG tablet Take 50 mg by mouth daily. 03/17/20   [provider]    Physical Exam: Vitals:   11/21/20 1730 11/21/20 1800 11/21/20 1900 11/21/20 2000  BP: (!) 147/54 (!) 149/57 (!) 151/57 (!) 151/61  Pulse: 68 65 68 69  Resp: 19 (!) 26 (!) 26 (!) 26  Temp:      TempSrc:      SpO2: 94% 96% 94% 95%  Weight:      Height:        Constitutional: NAD, calm, comfortable Eyes: PERRL, lids and conjunctivae normal ENMT: Mucous membranes are moist. Posterior pharynx clear of any exudate or lesions.Normal dentition.  Neck: normal, supple, no masses, no thyromegaly Respiratory: clear to auscultation bilaterally, no wheezing, no crackles. Normal respiratory effort. No accessory muscle use.  Cardiovascular: Regular rate and rhythm, no murmurs / rubs / gallops. No extremity edema. 2+ pedal pulses. No carotid bruits.  Abdomen: no tenderness, no masses palpated. No hepatosplenomegaly. Bowel sounds positive.  Musculoskeletal: no clubbing / cyanosis. No joint deformity upper and lower extremities. Good ROM, no contractures. Normal muscle tone.  Skin: Swelling and edema of scrotum, worse on L.  Associated erythema. Neurologic: CN 2-12 grossly intact. Sensation intact, DTR normal. Strength 5/5 in all 4.  Psychiatric: Normal judgment and insight. Alert and oriented x 3. Normal mood.    Labs on Admission: I have personally reviewed following labs and imaging studies  CBC: Recent Labs  Lab 11/21/20 1653  WBC 15.7*  NEUTROABS  13.3*  HGB 13.0  HCT 40.6  MCV 98.5  PLT 831   Basic Metabolic Panel: Recent Labs  Lab 11/21/20 1653  NA 136  K 3.7  CL 102  CO2 26  GLUCOSE 163*  BUN 25*  CREATININE 0.92  CALCIUM 8.4*   GFR: Estimated Creatinine Clearance: 72.6 mL/min (by C-G formula based on SCr of 0.92 mg/dL). Liver Function Tests: Recent Labs  Lab 11/21/20 1653  AST 21  ALT 20  ALKPHOS 78  BILITOT 0.6  PROT 6.8  ALBUMIN 3.3*   No results for input(s): LIPASE, AMYLASE in the last 168 hours. No results for input(s): AMMONIA in the last 168 hours. Coagulation Profile: No results for input(s): INR, PROTIME in the last 168 hours. Cardiac Enzymes: No results for input(s): CKTOTAL, CKMB, CKMBINDEX, TROPONINI in the last 168 hours. BNP (last 3 results) No results for input(s): PROBNP in the last 8760 hours. HbA1C: No results for input(s): HGBA1C  in the last 72 hours. CBG: No results for input(s): GLUCAP in the last 168 hours. Lipid Profile: No results for input(s): CHOL, HDL, LDLCALC, TRIG, CHOLHDL, LDLDIRECT in the last 72 hours. Thyroid Function Tests: No results for input(s): TSH, T4TOTAL, FREET4, T3FREE, THYROIDAB in the last 72 hours. Anemia Panel: No results for input(s): VITAMINB12, FOLATE, FERRITIN, TIBC, IRON, RETICCTPCT in the last 72 hours. Urine analysis:    Component Value Date/Time   COLORURINE AMBER (A) 11/21/2020 1653   APPEARANCEUR HAZY (A) 11/21/2020 1653   LABSPEC 1.016 11/21/2020 1653   PHURINE 5.0 11/21/2020 1653   GLUCOSEU NEGATIVE 11/21/2020 1653   HGBUR MODERATE (A) 11/21/2020 1653   BILIRUBINUR NEGATIVE 11/21/2020 1653   KETONESUR NEGATIVE 11/21/2020 1653   PROTEINUR NEGATIVE 11/21/2020 1653   NITRITE NEGATIVE 11/21/2020 1653   LEUKOCYTESUR LARGE (A) 11/21/2020 1653    Radiological Exams on Admission: US SCROTUM W/DOPPLER  Result Date: 11/21/2020 CLINICAL DATA:  Scrotal swelling. EXAM: SCROTAL ULTRASOUND DOPPLER ULTRASOUND OF THE TESTICLES TECHNIQUE: Complete  ultrasound examination of the testicles, epididymis, and other scrotal structures was performed. Color and spectral Doppler ultrasound were also utilized to evaluate blood flow to the testicles. COMPARISON:  None. FINDINGS: Right testicle Measurements: 3.4 x 3.0 x 2.7 cm. No mass or microlithiasis visualized. Left testicle Measurements: 3.6 x 2.4 x 2.9 cm. No definite testicular masses. There is a heterogeneous partly cystic mass that is contiguous with the testicle, but does not clearly arise from the testicle. This may arise from the epididymis. No color Doppler blood flow is seen within this. There is doppler blood flow along the periphery. The cystic and solid appearing mass measures 4.8 x 4.4 x 4.7 cm. Right epididymis:  Normal in size and appearance. Left epididymis: Complex cystic and solid avascular mass lies in the expected location of the right epididymal head. Hydrocele: Bilateral hydroceles, moderate left and small right, with some septations and apparent debris on the left. Varicocele:  None visualized. Pulsed Doppler interrogation of both testes demonstrates normal low resistance arterial and venous waveforms bilaterally. Bilateral scrotal wall thickening to 3 cm. IMPRESSION: 1. Complex cystic and solid avascular mass abuts the left testicle, but does not appear to arise from the testicle. Mass measures 4.8 cm in greatest dimension. This is felt to be epididymal in origin, but is of unclear etiology. 2. No defined testicular mass, no evidence of torsion and no findings consistent with epididymitis/orchitis. 3. Left greater than right hydroceles. 4. Significant scrotal skin thickening. Electronically Signed   By: Lajean Manes M.D.   On: 11/21/2020 18:25    EKG: Independently reviewed.  Assessment/Plan Principal Problem:   Testicular abscess Active Problems:   Coronary artery disease   AF (paroxysmal atrial fibrillation) (H[facility])   Essential hypertension   Acute lower UTI   Chronic HFrEF  (heart failure with reduced ejection fraction) ([place])    1. Testicular abscess, UTI with serratia - 1. Repeat UCx 2. Per EDP discussion with Urology: 1. Rocephin 2. NPO after MN 3. They will see in AM, possibly take to OR for I+D of abscess 3. Hold eliquis 2. Chronic systolic CHF - 1. Hold diuretics since NPO 2. Cont ARB, BB 3. A.Fib - 1. Holding eliquis 2. Cont amiodarone, BB 4. CAD - 1. Cont statin 5. HTN - 1. Cont ARB, BB, hold diuretics 6. Hypothyroid - cont synthroid  DVT prophylaxis: SCDs Code Status: Full Family Communication: Family at bedside Disposition Plan: Home after treatment for abscess and UTI. Consults called: Dr. Al Pimple -  urology Admission status: Admit to inpatient  Severity of Illness: The appropriate patient status for this patient is INPATIENT. Inpatient status is judged to be reasonable and necessary in order to provide the required intensity of service to ensure the patient's safety. The patient's presenting symptoms, physical exam findings, and initial radiographic and laboratory data in the context of their chronic comorbidities is felt to place them at high risk for further clinical deterioration. Furthermore, it is not anticipated that the patient will be medically stable for discharge from the hospital within 2 midnights of admission. The following factors support the patient status of inpatient.   IP status due to: 1) failed outpt treatment for UTI 2) development of worsening testicular abscess despite outpt ABx.   * I certify that at the point of admission it is my clinical judgment that the patient will require inpatient hospital care spanning beyond 2 midnights from the point of admission due to high intensity of service, high risk for further deterioration and high frequency of surveillance required.*    Amiria Orrison M. DO [facility]  How to contact the [facility] Attending or Consulting provider [place] or covering provider during after  hours [place], for this patient?  1. Check the care team in [facility] and look for a) attending/consulting TR[facility] provider listed and b) the [facility] team listed 2. Log into [email or web address]  Amion Physician Scheduling and messaging for groups and whole hospitals  On call and physician scheduling software for group practices, residents, hospitalists and other medical providers for call, clinic, rotation and shift schedules. OnCall Enterprise is a hospital-wide system for scheduling doctors and paging doctors on call. EasyPlot is for scientific plotting and data analysis.  [email or web address]  and use [place]'s universal password to access. If you do not have the password, please contact the hospital operator.  3. Locate the [facility] provider you are looking for under [facility] and page to a number that you can be directly reached. 4. If you still have difficulty reaching the provider, please page the [facility] (Director on Call) for the Hospitalists listed on amion for assistance.  11/21/2020, 8:53 PM

## 2020-11-21 NOTE — ED Notes (Signed)
Report called to Lenell Antu, RN on 4E

## 2020-11-21 NOTE — ED Provider Notes (Signed)
[facility] DEPT Provider Note   CSN: [id number] Arrival date & time: 11/21/20  1106     History Chief Complaint  Patient presents with  . Dysuria    Nathaniel Ibarra is a 79 y.o. male.  HPI Patient presents with scrotal and penile swelling with some pain.  On November 8 had prostate surgery done by Dr. [name].  Unsure exactly was done.  Had catheter in place for a week.  Around a week after that developed some scrotal and penile swelling.  Reportedly was seen on Monday at urology and was given a pill for the swelling.  Patient states on Wednesday he went back and was told that it was the wrong medicine and was given a shot and some different pills patient is does not know what they were.  States he also had an ultrasound to make sure that he was emptying his bladder.  States he is able to urinate freely.  However states his penis is now swollen and cannot see the tip of it.  States he is uncircumcised.  States the swelling is mostly on the left side of the scrotum.  No fevers.  States he was told if it continues to worsen may have to come in the hospital for treatment of the infection.    Past Medical History:  Diagnosis Date  . Bladder cancer ([place])   . BPH (benign prostatic hyperplasia)   . CHF (congestive heart failure) ([place])   . GERD (gastroesophageal reflux disease)   . Hypertension   . Myocardial infarction [facility])    cath with stent in 2016  . Thyroid disease     Patient Active Problem List   Diagnosis Date Noted  . AF (paroxysmal atrial fibrillation) ([place])   . Essential hypertension   . Hypothyroidism   . Acute on chronic combined systolic and diastolic CHF (congestive heart failure) ([place]) 05/16/2020  . Acute on chronic systolic CHF (congestive heart failure) ([place]) 05/16/2020  . BPH (benign prostatic hyperplasia)   . GERD (gastroesophageal reflux disease)   . Hypertensive urgency   . CHF (congestive heart failure) ([place]) 11/13/2017  .  Atrial fibrillation with RVR ([place]) 11/12/2017  . Pulmonary edema cardiac cause ([place]) 11/12/2017  . Elevated troponin 11/12/2017  . Coronary artery disease 09/02/2015  . Acute coronary syndrome ([place]) 09/01/2015  . Unstable angina ([place]) 09/01/2015  . Chest pain 08/16/2015    Past Surgical History:  Procedure Laterality Date  . CARDIAC CATHETERIZATION N/A 09/01/2015   Procedure: Left Heart Cath and Coronary Angiography;  Surgeon: Dionisio David, MD;  Location: [place] [facility];  Service: Cardiovascular;  Laterality: N/A;  . CARDIAC CATHETERIZATION N/A 09/01/2015   Procedure: Coronary Stent Intervention;  Surgeon: Yolonda Kida, MD;  Location: [place] [facility];  Service: Cardiovascular;  Laterality: N/A;  . CATARACT EXTRACTION W/PHACO Left 11/23/2016   Procedure: CATARACT EXTRACTION PHACO AND INTRAOCULAR LENS PLACEMENT (IOC);  Surgeon: Leandrew Koyanagi, MD;  Location: [place];  Service: Ophthalmology;  Laterality: Left;  . CATARACT EXTRACTION W/PHACO Right 02/01/2017   Procedure: CATARACT EXTRACTION PHACO AND INTRAOCULAR LENS PLACEMENT ([place]) Complicated  right toric lens;  Surgeon: Leandrew Koyanagi, MD;  Location: [place];  Service: Ophthalmology;  Laterality: Right;  Malyugin toric Lens  . LEFT HEART CATH AND CORONARY ANGIOGRAPHY Right 11/14/2017   Procedure: LEFT HEART CATH AND CORONARY ANGIOGRAPHY;  Surgeon: Dionisio David, MD;  Location: [place] [facility];  Service: Cardiovascular;  Laterality: Right;  Family History  Problem Relation Age of Onset  . Emphysema Mother   . Emphysema Father     Social History   Tobacco Use  . Smoking status: Never Smoker  . Smokeless tobacco: Never Used  Substance Use Topics  . Alcohol use: No  . Drug use: No    Home Medications Prior to Admission medications   Medication Sig Start Date End Date Taking? Authorizing Provider  amiodarone (PACERONE) 200 MG tablet Take 1 tablet (200 mg total) by  mouth daily. 05/18/20   Loletha Grayer, MD  apixaban (ELIQUIS) 5 MG TABS tablet Take 1 tablet (5 mg total) by mouth 2 (two) times daily. 05/18/20   Loletha Grayer, MD  finasteride (PROSCAR) 5 MG tablet Take 1 tablet (5 mg total) by mouth daily. 05/18/20   Loletha Grayer, MD  furosemide (LASIX) 40 MG tablet Take 1 tablet (40 mg total) by mouth daily. 05/18/20   Loletha Grayer, MD  levothyroxine (SYNTHROID) 100 MCG tablet Take 100 mcg by mouth daily. 04/27/20   [provider]  losartan (COZAAR) 50 MG tablet Take 1 tablet (50 mg total) by mouth daily. 05/18/20   Loletha Grayer, MD  metoprolol succinate (TOPROL-XL) 25 MG 24 hr tablet Take 0.5 tablets (12.5 mg total) by mouth daily. 05/18/20   Loletha Grayer, MD  potassium chloride SA (KLOR-CON) 20 MEQ tablet Take 1 tablet (20 mEq total) by mouth daily for 7 days. 05/18/20 05/27/20  Loletha Grayer, MD  spironolactone (ALDACTONE) 25 MG tablet Take 0.5 tablets (12.5 mg total) by mouth daily. 05/19/20   Loletha Grayer, MD  tamsulosin (FLOMAX) 0.4 MG CAPS capsule Take 0.8 mg by mouth at bedtime.     [provider]  traZODone (DESYREL) 50 MG tablet Take 50 mg by mouth daily. 03/17/20   [provider]    Allergies    Patient has no known allergies.  Review of Systems   Review of Systems  Constitutional: Negative for appetite change, chills and fever.  Respiratory: Negative for shortness of breath.   Gastrointestinal: Negative for abdominal pain.  Endocrine: Negative for polyuria.  Genitourinary: Positive for penile swelling and scrotal swelling. Negative for discharge and penile pain.  Musculoskeletal: Negative for back pain.  Skin: Positive for color change.  Neurological: Negative for weakness.    Physical Exam Updated Vital Signs BP (!) 149/57   Pulse 65   Temp 97.9 F (36.6 C) (Oral)   Resp (!) 26   Ht 6' (1.829 m)   Wt 90.7 kg   SpO2 96%   BMI 27.12 kg/m   Physical Exam Vitals and nursing note  reviewed.  HENT:     Head: Atraumatic.     Mouth/Throat:     Mouth: Mucous membranes are moist.  Eyes:     Extraocular Movements: Extraocular movements intact.  Cardiovascular:     Rate and Rhythm: Regular rhythm.  Pulmonary:     Breath sounds: No wheezing or rhonchi.  Abdominal:     Tenderness: There is no abdominal tenderness.  Genitourinary:    Comments: Swelling and edema of the scrotum.  Worse on left more fullness of left hemiscrotum.  There is also swelling of the penis.  The scrotum is erythema but not the penis. Musculoskeletal:     Cervical back: Neck supple.  Skin:    General: Skin is warm.     Capillary Refill: Capillary refill takes less than 2 seconds.  Neurological:     Mental Status: He is alert and oriented  to person, place, and time.     ED Results / Procedures / Treatments   Labs (all labs ordered are listed, but only abnormal results are displayed) Labs Reviewed  CBC WITH DIFFERENTIAL/PLATELET - Abnormal; Notable for the following components:      Result Value   WBC 15.7 (*)    RBC 4.12 (*)    Neutro Abs 13.3 (*)    Monocytes Absolute 1.2 (*)    Abs Immature Granulocytes 0.15 (*)    All other components within normal limits  COMPREHENSIVE METABOLIC PANEL - Abnormal; Notable for the following components:   Glucose, Bld 163 (*)    BUN 25 (*)    Calcium 8.4 (*)    Albumin 3.3 (*)    All other components within normal limits  LACTIC ACID, PLASMA - Abnormal; Notable for the following components:   Lactic Acid, Venous <0.3 (*)    All other components within normal limits  RESP PANEL BY RT-PCR (FLU A&B, COVID) ARPGX2  URINALYSIS, ROUTINE W REFLEX MICROSCOPIC  LACTIC ACID, PLASMA    EKG None  Radiology US SCROTUM W/DOPPLER  Result Date: 11/21/2020 CLINICAL DATA:  Scrotal swelling. EXAM: SCROTAL ULTRASOUND DOPPLER ULTRASOUND OF THE TESTICLES TECHNIQUE: Complete ultrasound examination of the testicles, epididymis, and other scrotal structures was  performed. Color and spectral Doppler ultrasound were also utilized to evaluate blood flow to the testicles. COMPARISON:  None. FINDINGS: Right testicle Measurements: 3.4 x 3.0 x 2.7 cm. No mass or microlithiasis visualized. Left testicle Measurements: 3.6 x 2.4 x 2.9 cm. No definite testicular masses. There is a heterogeneous partly cystic mass that is contiguous with the testicle, but does not clearly arise from the testicle. This may arise from the epididymis. No color Doppler blood flow is seen within this. There is doppler blood flow along the periphery. The cystic and solid appearing mass measures 4.8 x 4.4 x 4.7 cm. Right epididymis:  Normal in size and appearance. Left epididymis: Complex cystic and solid avascular mass lies in the expected location of the right epididymal head. Hydrocele: Bilateral hydroceles, moderate left and small right, with some septations and apparent debris on the left. Varicocele:  None visualized. Pulsed Doppler interrogation of both testes demonstrates normal low resistance arterial and venous waveforms bilaterally. Bilateral scrotal wall thickening to 3 cm. IMPRESSION: 1. Complex cystic and solid avascular mass abuts the left testicle, but does not appear to arise from the testicle. Mass measures 4.8 cm in greatest dimension. This is felt to be epididymal in origin, but is of unclear etiology. 2. No defined testicular mass, no evidence of torsion and no findings consistent with epididymitis/orchitis. 3. Left greater than right hydroceles. 4. Significant scrotal skin thickening. Electronically Signed   By: Lajean Manes M.D.   On: 11/21/2020 18:25    Procedures Procedures (including critical care time)  Medications Ordered in ED Medications  cefTRIAXone (ROCEPHIN) 2 g in sodium chloride 0.9 % 100 mL IVPB (has no administration in time range)    ED Course  I have reviewed the triage vital signs and the nursing notes.  Pertinent labs & imaging results that were  available during my care of the patient were reviewed by me and considered in my medical decision making (see chart for details).    MDM Rules/Calculators/A&P                          Patient reportedly seen by urology this week.  Scrotal swelling and pain.  Discussed with urology.  Patient had a urine culture that grew Serratia on Monday.  Had been started on Keflex but was resistant to this.  On Wednesday had another culture done the results are not back yet.  Has been given Rocephin and sent home on Cefpodoxime.  Continued pain and increased swelling.  Still able to urinate.  White count is elevated.  Ultrasound done and showed likely epididymal abscess.  Discussed with Dr. Al Pimple again from urology.  Recommends admission to the hospital.  Potentially tomorrow would go to the OR but does not need to go tonight.  IV Rocephin for now.  Keep patient n.p.o. at midnight and hold anticoagulation.  Will be rounded on in the morning.  Does not appear septic.  Covid test negative Final Clinical Impression(s) / ED Diagnoses Final diagnoses:  Epididymal abscess    Rx / DC Orders ED Discharge Orders    None       Davonna Belling, MD 11/21/20 [phone]

## 2020-11-21 NOTE — ED Triage Notes (Addendum)
Pt reports he had a procedure done on his prostate on 11/8 and had a catheter in place until 11/15. Pt reports he began to have some scrotal swelling over the last few weeks. Pt has been on some medication for the swelling. Pt reports dysuria at this time and reports the swelling is still in place. Pt reports penile swelling as well for the past couple of days.

## 2020-11-22 LAB — CBC
HCT: 37.6 % — ABNORMAL LOW (ref 39.0–52.0)
Hemoglobin: 12.2 g/dL — ABNORMAL LOW (ref 13.0–17.0)
MCH: 31.7 pg (ref 26.0–34.0)
MCHC: 32.4 g/dL (ref 30.0–36.0)
MCV: 97.7 fL (ref 80.0–100.0)
Platelets: 340 10*3/uL (ref 150–400)
RBC: 3.85 MIL/uL — ABNORMAL LOW (ref 4.22–5.81)
RDW: 11.9 % (ref 11.5–15.5)
WBC: 12.7 10*3/uL — ABNORMAL HIGH (ref 4.0–10.5)
nRBC: 0 % (ref 0.0–0.2)

## 2020-11-22 LAB — BASIC METABOLIC PANEL
Anion gap: 11 (ref 5–15)
BUN: 21 mg/dL (ref 8–23)
CO2: 24 mmol/L (ref 22–32)
Calcium: 7.9 mg/dL — ABNORMAL LOW (ref 8.9–10.3)
Chloride: 104 mmol/L (ref 98–111)
Creatinine, Ser: 0.87 mg/dL (ref 0.61–1.24)
GFR, Estimated: >60 mL/min (ref >60)
Glucose, Bld: 113 mg/dL — ABNORMAL HIGH (ref 70–99)
Potassium: 4.1 mmol/L (ref 3.5–5.1)
Sodium: 139 mmol/L (ref 135–145)

## 2020-11-22 LAB — LACTIC ACID, PLASMA: Lactic Acid, Venous: 0.8 mmol/L (ref 0.5–1.9)

## 2020-11-22 NOTE — Progress Notes (Signed)
[facility] PROGRESS NOTE  Patient: Nathaniel Ibarra GQB:[id number]   PCP: [name], MD DOB: 11-16-1941   DOA: 11/21/2020   DOS: 11/22/2020    Subjective: Denies any acute complaint but no nausea no vomiting.  Reports scrotal swelling and pain secondary to stretching.  Objective:  Vitals:   11/22/20 0839 11/22/20 1135  BP: 135/86 (!) 141/57  Pulse: 61 60  Resp: 18 20  Temp: 97.9 F (36.6 C) 98.5 F (36.9 C)  SpO2: 93% 94%    General: Appear in mild distress, no Rash; Oral Mucosa Clear, moist. no Abnormal Neck Mass Or lumps, Conjunctiva normal  Cardiovascular: S1 and S2 Present, no Murmur, Respiratory: good respiratory effort, Bilateral Air entry present and CTA, no Crackles, no wheezes Abdomen: Bowel Sound present, Soft and no tenderness, scrotal swelling Extremities: no Pedal edema Neurology: alert and oriented to time, place, and person affect appropriate. no new focal deficit Gait not checked due to patient safety concerns   Assessment and plan: Testicular abscess. UTI with Serratia. Currently on IV antibiotics. Management per urology. Hold Eliquis. Urology notified the RN that they do not want to take the patient to the OR today.  Currently they want [place] to place the orders for the diet.  We will place heart healthy diet.  Chronic systolic CHF. Holding diuretics. Continue ARB and beta-blocker.  A. fib. Currently rate controlled. Continue amiodarone and beta-blocker. Hold Eliquis.  Author: Berle Mull, MD [facility] 11/22/2020 6:55 PM   If 7PM-7AM, please contact night-coverage at [email or web address]

## 2020-11-22 NOTE — Consult Note (Signed)
Urology Consult   Physician requesting consult: Dr. Berle Mull  Reason for consult: Epididymitis, possible epididymal abscess  History of Present Illness: Nathaniel Ibarra is a 79 y.o. with BPH and recent scrotal swelling and pain concerning for epididymoorchitis who presented with worsening pain and swelling despite antibiotics. He underwent Rezume treatment about 4 weeks ago, had catheter placed for about a week after that and then noticed pain and swelling of the left testicle. He was started on antibiotics for presumed epididymoorchitis however UCx results several days later revealed bacteria resistant to chosen antibiotic and he was switched on 12/2. He presented to the ED on 12/4 due to increased scrotal and penile pain and swelling despite antibiotics. Afebrile, VSS, Leukocytosis to 15, lactate wnl. Pain controlled. Scrotal US with thickened scrotal skin, normal testicles, mild reactive hydrocele and multiloculated structure arising from epididymis.    Past Medical History:  Diagnosis Date  . Bladder cancer ([place])   . BPH (benign prostatic hyperplasia)   . CHF (congestive heart failure) ([place])   . GERD (gastroesophageal reflux disease)   . Hypertension   . Myocardial infarction [facility])    cath with stent in 2016  . Thyroid disease     Past Surgical History:  Procedure Laterality Date  . CARDIAC CATHETERIZATION N/A 09/01/2015   Procedure: Left Heart Cath and Coronary Angiography;  Surgeon: Dionisio David, MD;  Location: [place] [facility];  Service: Cardiovascular;  Laterality: N/A;  . CARDIAC CATHETERIZATION N/A 09/01/2015   Procedure: Coronary Stent Intervention;  Surgeon: Yolonda Kida, MD;  Location: [place] [facility];  Service: Cardiovascular;  Laterality: N/A;  . CATARACT EXTRACTION W/PHACO Left 11/23/2016   Procedure: CATARACT EXTRACTION PHACO AND INTRAOCULAR LENS PLACEMENT (IOC);  Surgeon: Leandrew Koyanagi, MD;  Location: [place];  Service:  Ophthalmology;  Laterality: Left;  . CATARACT EXTRACTION W/PHACO Right 02/01/2017   Procedure: CATARACT EXTRACTION PHACO AND INTRAOCULAR LENS PLACEMENT ([place]) Complicated  right toric lens;  Surgeon: Leandrew Koyanagi, MD;  Location: [place];  Service: Ophthalmology;  Laterality: Right;  Malyugin toric Lens  . LEFT HEART CATH AND CORONARY ANGIOGRAPHY Right 11/14/2017   Procedure: LEFT HEART CATH AND CORONARY ANGIOGRAPHY;  Surgeon: Dionisio David, MD;  Location: [place] [facility];  Service: Cardiovascular;  Laterality: Right;    Current Hospital Medications:  Home Meds:  No current facility-administered medications on file prior to encounter.   Current Outpatient Medications on File Prior to Encounter  Medication Sig Dispense Refill  . amiodarone (PACERONE) 200 MG tablet Take 1 tablet (200 mg total) by mouth daily. 30 tablet 0  . apixaban (ELIQUIS) 5 MG TABS tablet Take 1 tablet (5 mg total) by mouth 2 (two) times daily. 60 tablet 0  . cefpodoxime (VANTIN) 200 MG tablet Take 200 mg by mouth 2 (two) times daily.    . finasteride (PROSCAR) 5 MG tablet Take 1 tablet (5 mg total) by mouth daily. 30 tablet 0  . levothyroxine (SYNTHROID) 100 MCG tablet Take 100 mcg by mouth daily.    Marland Kitchen losartan (COZAAR) 50 MG tablet Take 1 tablet (50 mg total) by mouth daily. 30 tablet 0  . PARoxetine (PAXIL) 20 MG tablet Take 20 mg by mouth daily.    . potassium chloride SA (KLOR-CON) 20 MEQ tablet Take 1 tablet (20 mEq total) by mouth daily for 7 days. 7 tablet 0  . spironolactone (ALDACTONE) 25 MG tablet Take 0.5 tablets (12.5 mg total) by mouth daily. 15 tablet 0  .  tamsulosin (FLOMAX) 0.4 MG CAPS capsule Take 0.8 mg by mouth at bedtime.   3  . traMADol (ULTRAM) 50 MG tablet Take 50 mg by mouth every 6 (six) hours.    . traZODone (DESYREL) 50 MG tablet Take 50 mg by mouth at bedtime.        Scheduled Meds: Continuous Infusions: . cefTRIAXone (ROCEPHIN)  IV     PRN Meds:.acetaminophen  **OR** acetaminophen, morphine injection, ondansetron **OR** ondansetron (ZOFRAN) IV  Allergies: No Known Allergies  Family History  Problem Relation Age of Onset  . Emphysema Mother   . Emphysema Father     Social History:  reports that he has never smoked. He has never used smokeless tobacco. He reports that he does not drink alcohol and does not use drugs.  ROS: A complete review of systems was performed.  All systems are negative except for pertinent findings as noted.  Physical Exam:  Vital signs in last 24 hours: Temp:  [97.9 F (36.6 C)-98.7 F (37.1 C)] 97.9 F (36.6 C) (12/05 0421) Pulse Rate:  [61-69] 62 (12/05 0421) Resp:  [16-31] 22 (12/05 0421) BP: (146-164)/(54-65) 146/60 (12/05 0421) SpO2:  [85 %-98 %] 85 % (12/05 0421) Weight:  [90.7 kg] 90.7 kg (12/04 1121) Constitutional:  Alert and oriented, No acute distress Cardiovascular: Regular rate and rhythm, No JVD Respiratory: Normal respiratory effort, Lungs clear bilaterally GI: Abdomen is soft, nontender, nondistended, no abdominal masses GU: swollen and erythematous scrotum, tender on left side. Slightly indurated but no weeping, necrosis or crepitus present. Difficult to differentiate L epididymis/testicle given swelling. Penis is also swollen but appears to be edema only.  Lymphatic: No lymphadenopathy Neurologic: Grossly intact, no focal deficits Psychiatric: Normal mood and affect  Laboratory Data:  Recent Labs    11/21/20 1653 11/22/20 0601  WBC 15.7* 12.7*  HGB 13.0 12.2*  HCT 40.6 37.6*  PLT 359 340    Recent Labs    11/21/20 1653  NA 136  K 3.7  CL 102  GLUCOSE 163*  BUN 25*  CALCIUM 8.4*  CREATININE 0.92     Results for orders placed or performed during the hospital encounter of 11/21/20 (from the past 24 hour(s))  Urinalysis, Routine w reflex microscopic Urine, Clean Catch     Status: Abnormal   Collection Time: 11/21/20  4:53 PM  Result Value Ref Range   Color, Urine AMBER (A)  YELLOW   APPearance HAZY (A) CLEAR   Specific Gravity, Urine 1.016 1.005 - 1.030   pH 5.0 5.0 - 8.0   Glucose, UA NEGATIVE NEGATIVE mg/dL   Hgb urine dipstick MODERATE (A) NEGATIVE   Bilirubin Urine NEGATIVE NEGATIVE   Ketones, ur NEGATIVE NEGATIVE mg/dL   Protein, ur NEGATIVE NEGATIVE mg/dL   Nitrite NEGATIVE NEGATIVE   Leukocytes,Ua LARGE (A) NEGATIVE   RBC / HPF 11-20 0 - 5 RBC/hpf   WBC, UA >50 (H) 0 - 5 WBC/hpf   Bacteria, UA RARE (A) NONE SEEN   Squamous Epithelial / LPF 0-5 0 - 5   WBC Clumps PRESENT    Mucus PRESENT   CBC with Differential     Status: Abnormal   Collection Time: 11/21/20  4:53 PM  Result Value Ref Range   WBC 15.7 (H) 4.0 - 10.5 K/uL   RBC 4.12 (L) 4.22 - 5.81 MIL/uL   Hemoglobin 13.0 13.0 - 17.0 g/dL   HCT 40.6 39 - 52 %   MCV 98.5 80.0 - 100.0 fL   MCH 31.6 26.0 -  34.0 pg   MCHC 32.0 30.0 - 36.0 g/dL   RDW 11.9 11.5 - 15.5 %   Platelets 359 150 - 400 K/uL   nRBC 0.0 0.0 - 0.2 %   Neutrophils Relative % 85 %   Neutro Abs 13.3 (H) 1.7 - 7.7 K/uL   Lymphocytes Relative 6 %   Lymphs Abs 1.0 0.7 - 4.0 K/uL   Monocytes Relative 8 %   Monocytes Absolute 1.2 (H) 0.1 - 1.0 K/uL   Eosinophils Relative 0 %   Eosinophils Absolute 0.1 0.0 - 0.5 K/uL   Basophils Relative 0 %   Basophils Absolute 0.0 0.0 - 0.1 K/uL   Immature Granulocytes 1 %   Abs Immature Granulocytes 0.15 (H) 0.00 - 0.07 K/uL  Comprehensive metabolic panel     Status: Abnormal   Collection Time: 11/21/20  4:53 PM  Result Value Ref Range   Sodium 136 135 - 145 mmol/L   Potassium 3.7 3.5 - 5.1 mmol/L   Chloride 102 98 - 111 mmol/L   CO2 26 22 - 32 mmol/L   Glucose, Bld 163 (H) 70 - 99 mg/dL   BUN 25 (H) 8 - 23 mg/dL   Creatinine, Ser 0.92 0.61 - 1.24 mg/dL   Calcium 8.4 (L) 8.9 - 10.3 mg/dL   Total Protein 6.8 6.5 - 8.1 g/dL   Albumin 3.3 (L) 3.5 - 5.0 g/dL   AST 21 15 - 41 U/L   ALT 20 0 - 44 U/L   Alkaline Phosphatase 78 38 - 126 U/L   Total Bilirubin 0.6 0.3 - 1.2 mg/dL    GFR, Estimated >60 >60 mL/min   Anion gap 8 5 - 15  Lactic acid, plasma     Status: Abnormal   Collection Time: 11/21/20  4:53 PM  Result Value Ref Range   Lactic Acid, Venous <0.3 (L) 0.5 - 1.9 mmol/L  Resp Panel by RT-PCR (Flu A&B, Covid) Nasopharyngeal Swab     Status: None   Collection Time: 11/21/20  4:55 PM   Specimen: Nasopharyngeal Swab; Nasopharyngeal(NP) swabs in vial transport medium  Result Value Ref Range   SARS Coronavirus 2 by RT PCR NEGATIVE NEGATIVE   Influenza A by PCR NEGATIVE NEGATIVE   Influenza B by PCR NEGATIVE NEGATIVE  CBC     Status: Abnormal   Collection Time: 11/22/20  6:01 AM  Result Value Ref Range   WBC 12.7 (H) 4.0 - 10.5 K/uL   RBC 3.85 (L) 4.22 - 5.81 MIL/uL   Hemoglobin 12.2 (L) 13.0 - 17.0 g/dL   HCT 37.6 (L) 39 - 52 %   MCV 97.7 80.0 - 100.0 fL   MCH 31.7 26.0 - 34.0 pg   MCHC 32.4 30.0 - 36.0 g/dL   RDW 11.9 11.5 - 15.5 %   Platelets 340 150 - 400 K/uL   nRBC 0.0 0.0 - 0.2 %   Recent Results (from the past 240 hour(s))  Resp Panel by RT-PCR (Flu A&B, Covid) Nasopharyngeal Swab     Status: None   Collection Time: 11/21/20  4:55 PM   Specimen: Nasopharyngeal Swab; Nasopharyngeal(NP) swabs in vial transport medium  Result Value Ref Range Status   SARS Coronavirus 2 by RT PCR NEGATIVE NEGATIVE Final    Comment: (NOTE) SARS-CoV-2 target nucleic acids are NOT DETECTED.  The SARS-CoV-2 RNA is generally detectable in upper respiratory specimens during the acute phase of infection. The lowest concentration of SARS-CoV-2 viral copies this assay can detect is 138 copies/mL. A negative  result does not preclude SARS-Cov-2 infection and should not be used as the sole basis for treatment or other patient management decisions. A negative result may occur with  improper specimen collection/handling, submission of specimen other than nasopharyngeal swab, presence of viral mutation(s) within the areas targeted by this assay, and inadequate number of  viral copies(<138 copies/mL). A negative result must be combined with clinical observations, patient history, and epidemiological information. The expected result is Negative.  Fact Sheet for Patients:  EntrepreneurPulse.com.au  Fact Sheet for Healthcare Providers:  IncredibleEmployment.be  This test is no t yet approved or cleared by the [place] FDA and  has been authorized for detection and/or diagnosis of SARS-CoV-2 by FDA under an Emergency Use Authorization (EUA). This EUA will remain  in effect (meaning this test can be used) for the duration of the COVID-19 declaration under Section 564(b)(1) of the Act, 21 U.S.C.section 360bbb-3(b)(1), unless the authorization is terminated  or revoked sooner.       Influenza A by PCR NEGATIVE NEGATIVE Final   Influenza B by PCR NEGATIVE NEGATIVE Final    Comment: (NOTE) The Xpert Xpress SARS-CoV-2/FLU/RSV plus assay is intended as an aid in the diagnosis of influenza from Nasopharyngeal swab specimens and should not be used as a sole basis for treatment. Nasal washings and aspirates are unacceptable for Xpert Xpress SARS-CoV-2/FLU/RSV testing.  Fact Sheet for Patients: EntrepreneurPulse.com.au  Fact Sheet for Healthcare Providers: IncredibleEmployment.be  This test is not yet approved or cleared by the [place] FDA and has been authorized for detection and/or diagnosis of SARS-CoV-2 by FDA under an Emergency Use Authorization (EUA). This EUA will remain in effect (meaning this test can be used) for the duration of the COVID-19 declaration under Section 564(b)(1) of the Act, 21 U.S.C. section 360bbb-3(b)(1), unless the authorization is terminated or revoked.  Performed at [facility], [place] [address], [place],  [address]     Renal Function: Recent Labs    11/21/20 1653  CREATININE 0.92   Estimated Creatinine  Clearance: 72.6 mL/min (by C-G formula based on SCr of 0.92 mg/dL).  Radiologic Imaging: US SCROTUM W/DOPPLER  Result Date: 11/21/2020 CLINICAL DATA:  Scrotal swelling. EXAM: SCROTAL ULTRASOUND DOPPLER ULTRASOUND OF THE TESTICLES TECHNIQUE: Complete ultrasound examination of the testicles, epididymis, and other scrotal structures was performed. Color and spectral Doppler ultrasound were also utilized to evaluate blood flow to the testicles. COMPARISON:  None. FINDINGS: Right testicle Measurements: 3.4 x 3.0 x 2.7 cm. No mass or microlithiasis visualized. Left testicle Measurements: 3.6 x 2.4 x 2.9 cm. No definite testicular masses. There is a heterogeneous partly cystic mass that is contiguous with the testicle, but does not clearly arise from the testicle. This may arise from the epididymis. No color Doppler blood flow is seen within this. There is doppler blood flow along the periphery. The cystic and solid appearing mass measures 4.8 x 4.4 x 4.7 cm. Right epididymis:  Normal in size and appearance. Left epididymis: Complex cystic and solid avascular mass lies in the expected location of the right epididymal head. Hydrocele: Bilateral hydroceles, moderate left and small right, with some septations and apparent debris on the left. Varicocele:  None visualized. Pulsed Doppler interrogation of both testes demonstrates normal low resistance arterial and venous waveforms bilaterally. Bilateral scrotal wall thickening to 3 cm. IMPRESSION: 1. Complex cystic and solid avascular mass abuts the left testicle, but does not appear to arise from the testicle. Mass measures 4.8 cm in greatest dimension. This is  felt to be epididymal in origin, but is of unclear etiology. 2. No defined testicular mass, no evidence of torsion and no findings consistent with epididymitis/orchitis. 3. Left greater than right hydroceles. 4. Significant scrotal skin thickening. Electronically Signed   By: Lajean Manes M.D.   On: 11/21/2020  18:25    I independently reviewed the above imaging studies.  Impression/Recommendation 79yo M with epididymitis, possible epididymal abscess. Patient feels well overall and leukocytosis is downtrending at this time with IV antibiotics.  - no acute urologic intervention recommended at this time.  Imaging may indicate epididymal abscess, however could also be dilated and inflamed epididymis from epididymitis and in the setting of recent eliquis and proximity to testicular structures, recommend conservative treatment at this time.  - agree with continued IV antibiotics - Please hold Eliquis for possible intervention - ok for diet today, but make NPO at midnight for possible intervention - recommend scrotal elevation to help with scrotal swelling - penile swelling can be monitored at this time, however if patient is unable to urinate due to swelling, catheter placement may be necessary - pain control as needed  Alysen Demzik 11/22/2020, 7:55 AM

## 2020-11-23 LAB — CBC WITH DIFFERENTIAL/PLATELET
Abs Immature Granulocytes: 0.13 10*3/uL — ABNORMAL HIGH (ref 0.00–0.07)
Basophils Absolute: 0 10*3/uL (ref 0.0–0.1)
Basophils Relative: 0 %
Eosinophils Absolute: 0.1 10*3/uL (ref 0.0–0.5)
Eosinophils Relative: 1 %
HCT: 38.3 % — ABNORMAL LOW (ref 39.0–52.0)
Hemoglobin: 12.2 g/dL — ABNORMAL LOW (ref 13.0–17.0)
Immature Granulocytes: 1 %
Lymphocytes Relative: 8 %
Lymphs Abs: 1 10*3/uL (ref 0.7–4.0)
MCH: 31.1 pg (ref 26.0–34.0)
MCHC: 31.9 g/dL (ref 30.0–36.0)
MCV: 97.7 fL (ref 80.0–100.0)
Monocytes Absolute: 1.3 10*3/uL — ABNORMAL HIGH (ref 0.1–1.0)
Monocytes Relative: 10 %
Neutro Abs: 10.4 10*3/uL — ABNORMAL HIGH (ref 1.7–7.7)
Neutrophils Relative %: 80 %
Platelets: 366 10*3/uL (ref 150–400)
RBC: 3.92 MIL/uL — ABNORMAL LOW (ref 4.22–5.81)
RDW: 12 % (ref 11.5–15.5)
WBC: 13 10*3/uL — ABNORMAL HIGH (ref 4.0–10.5)
nRBC: 0 % (ref 0.0–0.2)

## 2020-11-23 MED ORDER — TAMSULOSIN HCL 0.4 MG PO CAPS
0.8000 mg | ORAL_CAPSULE | Freq: Every day | ORAL | Status: DC
  Administered 2020-11-23 – 2020-11-26 (×3): 0.8 mg via ORAL
  Filled 2020-11-23 (×4): qty 2

## 2020-11-23 MED ORDER — SODIUM CHLORIDE 0.9 % IV SOLN
2.0000 g | Freq: Three times a day (TID) | INTRAVENOUS | Status: DC
  Administered 2020-11-23 – 2020-11-26 (×9): 2 g via INTRAVENOUS
  Filled 2020-11-23 (×11): qty 2

## 2020-11-23 MED ORDER — AMIODARONE HCL 200 MG PO TABS
200.0000 mg | ORAL_TABLET | Freq: Every day | ORAL | Status: DC
  Administered 2020-11-23 – 2020-11-27 (×5): 200 mg via ORAL
  Filled 2020-11-23 (×5): qty 1

## 2020-11-23 MED ORDER — SPIRONOLACTONE 12.5 MG HALF TABLET
12.5000 mg | ORAL_TABLET | Freq: Every day | ORAL | Status: DC
  Administered 2020-11-23 – 2020-11-27 (×5): 12.5 mg via ORAL
  Filled 2020-11-23 (×5): qty 1

## 2020-11-23 MED ORDER — PAROXETINE HCL 20 MG PO TABS
20.0000 mg | ORAL_TABLET | Freq: Every day | ORAL | Status: DC
  Administered 2020-11-23 – 2020-11-27 (×5): 20 mg via ORAL
  Filled 2020-11-23 (×5): qty 1

## 2020-11-23 MED ORDER — LOSARTAN POTASSIUM 50 MG PO TABS
50.0000 mg | ORAL_TABLET | Freq: Every day | ORAL | Status: DC
  Administered 2020-11-23 – 2020-11-27 (×5): 50 mg via ORAL
  Filled 2020-11-23 (×5): qty 1

## 2020-11-23 MED ORDER — TRAZODONE HCL 50 MG PO TABS
50.0000 mg | ORAL_TABLET | Freq: Every day | ORAL | Status: DC
  Administered 2020-11-23 – 2020-11-26 (×4): 50 mg via ORAL
  Filled 2020-11-23 (×4): qty 1

## 2020-11-23 MED ORDER — TRAMADOL HCL 50 MG PO TABS
50.0000 mg | ORAL_TABLET | Freq: Four times a day (QID) | ORAL | Status: DC | PRN
  Administered 2020-11-24 – 2020-11-25 (×3): 50 mg via ORAL
  Filled 2020-11-23 (×3): qty 1

## 2020-11-23 MED ORDER — LEVOTHYROXINE SODIUM 100 MCG PO TABS
100.0000 ug | ORAL_TABLET | Freq: Every day | ORAL | Status: DC
  Administered 2020-11-24 – 2020-11-27 (×4): 100 ug via ORAL
  Filled 2020-11-23 (×4): qty 1

## 2020-11-23 MED ORDER — POLYETHYLENE GLYCOL 3350 17 G PO PACK
17.0000 g | PACK | Freq: Every day | ORAL | Status: DC
  Administered 2020-11-23 – 2020-11-27 (×4): 17 g via ORAL
  Filled 2020-11-23 (×4): qty 1

## 2020-11-23 MED ORDER — FINASTERIDE 5 MG PO TABS
5.0000 mg | ORAL_TABLET | Freq: Every day | ORAL | Status: DC
  Administered 2020-11-23 – 2020-11-27 (×5): 5 mg via ORAL
  Filled 2020-11-23 (×5): qty 1

## 2020-11-23 MED ORDER — SENNOSIDES-DOCUSATE SODIUM 8.6-50 MG PO TABS
1.0000 | ORAL_TABLET | Freq: Two times a day (BID) | ORAL | Status: DC
  Administered 2020-11-23 – 2020-11-27 (×9): 1 via ORAL
  Filled 2020-11-23 (×9): qty 1

## 2020-11-23 NOTE — Progress Notes (Signed)
PHARMACY NOTE -  cefepime  Pharmacy has been assisting with dosing of cefepime for testicular abscess/UTI with serratia and pseudomonas.  Dosage remains stable at 2g IV q8 hr and need for further dosage adjustment appears unlikely at present given SCr at baseline  Pharmacy will sign off, following peripherally for culture results or dose adjustments. Please reconsult if a change in clinical status warrants re-evaluation of dosage.  Reuel Boom, PharmD, BCPS [phone] 11/23/2020, 11:20 AM

## 2020-11-23 NOTE — Progress Notes (Signed)
Urology Inpatient Progress Report        Intv/Subj: No overnight events.  Patient is able to urinate spontaneously.    Principal Problem:   Testicular abscess Active Problems:   Coronary artery disease   AF (paroxysmal atrial fibrillation) ([facility])   Essential hypertension   Acute lower UTI   Chronic HFrEF (heart failure with reduced ejection fraction) ([place])  Current Facility-Administered Medications  Medication Dose Route Frequency Provider Last Rate Last Admin  . acetaminophen (TYLENOL) tablet 650 mg  650 mg Oral Q6H PRN Etta Quill, DO       Or  . acetaminophen (TYLENOL) suppository 650 mg  650 mg Rectal Q6H PRN Etta Quill, DO      . cefTRIAXone (ROCEPHIN) 2 g in sodium chloride 0.9 % 100 mL IVPB  2 g Intravenous Q24H Jennette Kettle M, DO 200 mL/hr at 11/22/20 1720 2 g at 11/22/20 1720  . morphine 2 MG/ML injection 2 mg  2 mg Intravenous Q2H PRN Etta Quill, DO   2 mg at 11/23/20 0336  . ondansetron (ZOFRAN) tablet 4 mg  4 mg Oral Q6H PRN Etta Quill, DO       Or  . ondansetron [facility]) injection 4 mg  4 mg Intravenous Q6H PRN Etta Quill, DO   4 mg at 11/23/20 0336     Objective: Vital: Vitals:   11/22/20 0839 11/22/20 1135 11/22/20 2218 11/23/20 0617  BP: 135/86 (!) 141/57 (!) 151/66 (!) 151/81  Pulse: 61 60 63 62  Resp: 18 20 (!) 22 16  Temp: 97.9 F (36.6 C) 98.5 F (36.9 C) 98.2 F (36.8 C) 98.3 F (36.8 C)  TempSrc: Oral Oral    SpO2: 93% 94% 93% 93%  Weight:      Height:       I/Os: I/O last 3 completed shifts: In: 4403 [P.O.:1320; IV Piggyback:100] Out: 925 [Urine:925]  Physical Exam:  General: Patient is in no apparent distress Lungs: Normal respiratory effort, chest expands symmetrically. GI: the abdomen is soft and nontender without mass. GU: moderate penile shaft swelling with foreskin in proper anatomic position, significant left testicular swelling/tenderness to palpation/thickened skin adherent to underlying  testicle Ext: lower extremities symmetric  Lab Results: Recent Labs    11/21/20 1653 11/22/20 0601  WBC 15.7* 12.7*  HGB 13.0 12.2*  HCT 40.6 37.6*   Recent Labs    11/21/20 1653 11/22/20 0601  NA 136 139  K 3.7 4.1  CL 102 104  CO2 26 24  GLUCOSE 163* 113*  BUN 25* 21  CREATININE 0.92 0.87  CALCIUM 8.4* 7.9*   No results for input(s): LABPT, INR in the last 72 hours. No results for input(s): LABURIN in the last 72 hours. Results for orders placed or performed during the hospital encounter of 11/21/20  Resp Panel by RT-PCR (Flu A&B, Covid) Nasopharyngeal Swab     Status: None   Collection Time: 11/21/20  4:55 PM   Specimen: Nasopharyngeal Swab; Nasopharyngeal(NP) swabs in vial transport medium  Result Value Ref Range Status   SARS Coronavirus 2 by RT PCR NEGATIVE NEGATIVE Final    Comment: (NOTE) SARS-CoV-2 target nucleic acids are NOT DETECTED.  The SARS-CoV-2 RNA is generally detectable in upper respiratory specimens during the acute phase of infection. The lowest concentration of SARS-CoV-2 viral copies this assay can detect is 138 copies/mL. A negative result does not preclude SARS-Cov-2 infection and should not be used as the sole basis for treatment or other patient  management decisions. A negative result may occur with  improper specimen collection/handling, submission of specimen other than nasopharyngeal swab, presence of viral mutation(s) within the areas targeted by this assay, and inadequate number of viral copies(<138 copies/mL). A negative result must be combined with clinical observations, patient history, and epidemiological information. The expected result is Negative.  Fact Sheet for Patients:  EntrepreneurPulse.com.au  Fact Sheet for Healthcare Providers:  IncredibleEmployment.be  This test is no t yet approved or cleared by the [place] FDA and  has been authorized for detection and/or diagnosis of  SARS-CoV-2 by FDA under an Emergency Use Authorization (EUA). This EUA will remain  in effect (meaning this test can be used) for the duration of the COVID-19 declaration under Section 564(b)(1) of the Act, 21 U.S.C.section 360bbb-3(b)(1), unless the authorization is terminated  or revoked sooner.       Influenza A by PCR NEGATIVE NEGATIVE Final   Influenza B by PCR NEGATIVE NEGATIVE Final    Comment: (NOTE) The Xpert Xpress SARS-CoV-2/FLU/RSV plus assay is intended as an aid in the diagnosis of influenza from Nasopharyngeal swab specimens and should not be used as a sole basis for treatment. Nasal washings and aspirates are unacceptable for Xpert Xpress SARS-CoV-2/FLU/RSV testing.  Fact Sheet for Patients: EntrepreneurPulse.com.au  Fact Sheet for Healthcare Providers: IncredibleEmployment.be  This test is not yet approved or cleared by the [place] FDA and has been authorized for detection and/or diagnosis of SARS-CoV-2 by FDA under an Emergency Use Authorization (EUA). This EUA will remain in effect (meaning this test can be used) for the duration of the COVID-19 declaration under Section 564(b)(1) of the Act, 21 U.S.C. section 360bbb-3(b)(1), unless the authorization is terminated or revoked.  Performed at [facility], [place] [address], [place], [place] [address]     Studies/Results: US SCROTUM W/DOPPLER  Result Date: 11/21/2020 CLINICAL DATA:  Scrotal swelling. EXAM: SCROTAL ULTRASOUND DOPPLER ULTRASOUND OF THE TESTICLES TECHNIQUE: Complete ultrasound examination of the testicles, epididymis, and other scrotal structures was performed. Color and spectral Doppler ultrasound were also utilized to evaluate blood flow to the testicles. COMPARISON:  None. FINDINGS: Right testicle Measurements: 3.4 x 3.0 x 2.7 cm. No mass or microlithiasis visualized. Left testicle Measurements: 3.6 x 2.4 x 2.9 cm. No definite testicular  masses. There is a heterogeneous partly cystic mass that is contiguous with the testicle, but does not clearly arise from the testicle. This may arise from the epididymis. No color Doppler blood flow is seen within this. There is doppler blood flow along the periphery. The cystic and solid appearing mass measures 4.8 x 4.4 x 4.7 cm. Right epididymis:  Normal in size and appearance. Left epididymis: Complex cystic and solid avascular mass lies in the expected location of the right epididymal head. Hydrocele: Bilateral hydroceles, moderate left and small right, with some septations and apparent debris on the left. Varicocele:  None visualized. Pulsed Doppler interrogation of both testes demonstrates normal low resistance arterial and venous waveforms bilaterally. Bilateral scrotal wall thickening to 3 cm. IMPRESSION: 1. Complex cystic and solid avascular mass abuts the left testicle, but does not appear to arise from the testicle. Mass measures 4.8 cm in greatest dimension. This is felt to be epididymal in origin, but is of unclear etiology. 2. No defined testicular mass, no evidence of torsion and no findings consistent with epididymitis/orchitis. 3. Left greater than right hydroceles. 4. Significant scrotal skin thickening. Electronically Signed   By: Lajean Manes M.D.   On: 11/21/2020  18:25    Assessment: 79 yo man with hx of BPH s/p rezum approximately 2 weeks ago with subsequent UTI and left epididymoorchitis.    Plan: -physical exam consistent with left epididymoorchitis -continue abx -continue scrotal elevation -patient can eat and drink today, make NPO at midnight again tonight in event clinical course changes -spoke with patient's wife [name] this AM for update   Jacalyn Lefevre, MD Urology 11/23/2020, 7:47 AM

## 2020-11-23 NOTE — Progress Notes (Signed)
[facility] Progress Note  Patient: Nathaniel Ibarra    JME:[id number]  DOA: 11/21/2020     Date of Service: the patient was seen and examined on 11/23/2020  Brief hospital course: Past medical history of HFrEF, CAD, HTN, bladder cancer, PAF on Eliquis.  Presents with complaints of dysuria. Currently plan is continue IV antibiotics and monitor urology recommendation..  Assessment and Plan: 1.  UTI, Serratia and Pseudomonas Testicular abscess versus epididymitis Urology following. Urology is suggested that the outpatient culture was positive for Serratia. Culture urine is positive for Pseudomonas. Initially was on IV Rocephin. Currently on IV cefepime. Urology initially was planning to consider I&D of the abscess. Currently recommending conservative measures. Monitor cultures.  2.  Chronic systolic CHF. Essential hypertension. Paroxysmal A. fib, rate controlled CAD Continue statin, continue amiodarone, continue beta-blocker.  Continuing ARB as well. Holding Eliquis.  Monitor.  3.  Hypothyroidism Continue Synthroid.  4.  BPH Continue home regimen.  Body mass index is 27.12 kg/m.    Interventions:        Diet: Regular diet cardiac DVT Prophylaxis:   SCDs Start: 11/21/20 2009    Advance goals of care discussion: Full code  Family Communication: no family was present at bedside, at the time of interview.   Disposition:  Status is: Inpatient  Remains inpatient appropriate because:Ongoing diagnostic testing needed not appropriate for outpatient work up   Dispo: The patient is from: Home              Anticipated d/c is to: Home              Anticipated d/c date is: 2 days              Patient currently is not medically stable to d/c.        Subjective: No nausea no vomiting.  No fever no chills.  Scrotal stretch pain still present but improving.  Physical Exam:  General: Appear in mild distress, no Rash; Oral Mucosa Clear, moist. no Abnormal Neck  Mass Or lumps, Conjunctiva normal  Cardiovascular: S1 and S2 Present, no Murmur, Respiratory: good respiratory effort, Bilateral Air entry present and CTA, no Crackles, no wheezes Abdomen: Bowl Sound present, Soft and no tenderness, scrotal swelling Extremities: no Pedal edema Neurology: alert and oriented to time, place, and person affect appropriate. no new focal deficit Gait not checked due to patient safety concerns  Vitals:   11/22/20 1135 11/22/20 2218 11/23/20 0617 11/23/20 1152  BP: (!) 141/57 (!) 151/66 (!) 151/81 (!) 146/66  Pulse: 60 63 62 (!) 59  Resp: 20 (!) 22 16 (!) 21  Temp: 98.5 F (36.9 C) 98.2 F (36.8 C) 98.3 F (36.8 C) 98.7 F (37.1 C)  TempSrc: Oral   Oral  SpO2: 94% 93% 93% 92%  Weight:      Height:        Intake/Output Summary (Last 24 hours) at 11/23/2020 2010 Last data filed at 11/23/2020 1710 Gross per 24 hour  Intake 480 ml  Output 1150 ml  Net -670 ml   Filed Weights   11/21/20 1121  Weight: 90.7 kg    Data Reviewed: I have personally reviewed and interpreted daily labs, tele strips, imagings as discussed above. I reviewed all nursing notes, pharmacy notes, vitals, pertinent old records I have discussed plan of care as described above with RN and patient/family.  CBC: Recent Labs  Lab 11/21/20 1653 11/22/20 0601 11/23/20 0837  WBC 15.7* 12.7* 13.0*  NEUTROABS 13.3*  --  10.4*  HGB 13.0 12.2* 12.2*  HCT 40.6 37.6* 38.3*  MCV 98.5 97.7 97.7  PLT 359 340 825   Basic Metabolic Panel: Recent Labs  Lab 11/21/20 1653 11/22/20 0601  NA 136 139  K 3.7 4.1  CL 102 104  CO2 26 24  GLUCOSE 163* 113*  BUN 25* 21  CREATININE 0.92 0.87  CALCIUM 8.4* 7.9*    Studies: No results found.  Scheduled Meds: . amiodarone  200 mg Oral Daily  . finasteride  5 mg Oral Daily  . [START ON 11/24/2020] levothyroxine  100 mcg Oral Q0600  . losartan  50 mg Oral Daily  . PARoxetine  20 mg Oral Daily  . polyethylene glycol  17 g Oral Daily  .  senna-docusate  1 tablet Oral BID  . spironolactone  12.5 mg Oral Daily  . tamsulosin  0.8 mg Oral QPC supper  . traZODone  50 mg Oral QHS   Continuous Infusions: . ceFEPime (MAXIPIME) IV 2 g (11/23/20 1444)   PRN Meds: acetaminophen **OR** acetaminophen, morphine injection, ondansetron **OR** ondansetron (ZOFRAN) IV, traMADol  Time spent: 35 minutes  Author: Berle Mull, MD [facility] 11/23/2020 8:10 PM  To reach On-call, see care teams to locate the attending and reach out via [email or web address]. Between 7PM-7AM, please contact night-coverage If you still have difficulty reaching the attending provider, please page the [facility] (Director on Call) for [facility] on amion for assistance.

## 2020-11-24 ENCOUNTER — Inpatient Hospital Stay (HOSPITAL_COMMUNITY): Payer: PPO | Admitting: Anesthesiology

## 2020-11-24 ENCOUNTER — Encounter (HOSPITAL_COMMUNITY)
Admission: EM | Disposition: A | Discharge status: Home / Self Care | Payer: Self-pay | Source: Home / Self Care | Attending: Internal Medicine

## 2020-11-24 ENCOUNTER — Encounter (HOSPITAL_COMMUNITY): Payer: Self-pay | Admitting: Internal Medicine

## 2020-11-24 HISTORY — PX: SCROTAL EXPLORATION: SHX2386

## 2020-11-24 LAB — BASIC METABOLIC PANEL
Anion gap: 11 (ref 5–15)
BUN: 22 mg/dL (ref 8–23)
CO2: 26 mmol/L (ref 22–32)
Calcium: 8.2 mg/dL — ABNORMAL LOW (ref 8.9–10.3)
Chloride: 99 mmol/L (ref 98–111)
Creatinine, Ser: 0.81 mg/dL (ref 0.61–1.24)
GFR, Estimated: >60 mL/min (ref >60)
Glucose, Bld: 114 mg/dL — ABNORMAL HIGH (ref 70–99)
Potassium: 4.5 mmol/L (ref 3.5–5.1)
Sodium: 136 mmol/L (ref 135–145)

## 2020-11-24 LAB — CBC WITH DIFFERENTIAL/PLATELET
Abs Immature Granulocytes: 0.22 10*3/uL — ABNORMAL HIGH (ref 0.00–0.07)
Basophils Absolute: 0 10*3/uL (ref 0.0–0.1)
Basophils Relative: 0 %
Eosinophils Absolute: 0.2 10*3/uL (ref 0.0–0.5)
Eosinophils Relative: 1 %
HCT: 38.5 % — ABNORMAL LOW (ref 39.0–52.0)
Hemoglobin: 12.6 g/dL — ABNORMAL LOW (ref 13.0–17.0)
Immature Granulocytes: 2 %
Lymphocytes Relative: 7 %
Lymphs Abs: 1 10*3/uL (ref 0.7–4.0)
MCH: 31.3 pg (ref 26.0–34.0)
MCHC: 32.7 g/dL (ref 30.0–36.0)
MCV: 95.8 fL (ref 80.0–100.0)
Monocytes Absolute: 1.4 10*3/uL — ABNORMAL HIGH (ref 0.1–1.0)
Monocytes Relative: 9 %
Neutro Abs: 11.9 10*3/uL — ABNORMAL HIGH (ref 1.7–7.7)
Neutrophils Relative %: 81 %
Platelets: 441 10*3/uL — ABNORMAL HIGH (ref 150–400)
RBC: 4.02 MIL/uL — ABNORMAL LOW (ref 4.22–5.81)
RDW: 11.8 % (ref 11.5–15.5)
WBC: 14.7 10*3/uL — ABNORMAL HIGH (ref 4.0–10.5)
nRBC: 0 % (ref 0.0–0.2)

## 2020-11-24 LAB — URINE CULTURE: Culture: 20000 — AB

## 2020-11-24 SURGERY — EXPLORATION, SCROTUM
Anesthesia: General | Site: Perineum | Laterality: Left

## 2020-11-24 MED ORDER — LACTATED RINGERS IV SOLN: INTRAVENOUS | Status: DC

## 2020-11-24 MED ORDER — ONDANSETRON HCL 4 MG/2ML IJ SOLN
INTRAMUSCULAR | Status: DC | PRN
  Administered 2020-11-24: 4 mg via INTRAVENOUS

## 2020-11-24 MED ORDER — IRRISEPT - 450ML BOTTLE WITH 0.05% CHG IN STERILE WATER, USP 99.95% OPTIME
TOPICAL | Status: DC | PRN
  Administered 2020-11-24: 200 mL

## 2020-11-24 MED ORDER — BUPIVACAINE HCL (PF) 0.25 % IJ SOLN
INTRAMUSCULAR | Status: AC
  Filled 2020-11-24: qty 30

## 2020-11-24 MED ORDER — FENTANYL CITRATE (PF) 100 MCG/2ML IJ SOLN
25.0000 ug | INTRAMUSCULAR | Status: DC | PRN
  Administered 2020-11-24: 50 ug via INTRAVENOUS

## 2020-11-24 MED ORDER — ROCURONIUM BROMIDE 10 MG/ML (PF) SYRINGE
PREFILLED_SYRINGE | INTRAVENOUS | Status: AC
  Filled 2020-11-24: qty 10

## 2020-11-24 MED ORDER — DEXAMETHASONE SODIUM PHOSPHATE 10 MG/ML IJ SOLN
INTRAMUSCULAR | Status: DC | PRN
  Administered 2020-11-24: 5 mg via INTRAVENOUS

## 2020-11-24 MED ORDER — ACETAMINOPHEN 500 MG PO TABS: 1000.0000 mg | ORAL_TABLET | Freq: Once | ORAL | Status: DC | PRN

## 2020-11-24 MED ORDER — LIDOCAINE 2% (20 MG/ML) 5 ML SYRINGE
INTRAMUSCULAR | Status: DC | PRN
  Administered 2020-11-24: 80 mg via INTRAVENOUS

## 2020-11-24 MED ORDER — OXYCODONE HCL 5 MG/5ML PO SOLN: 5.0000 mg | Freq: Once | ORAL | Status: DC | PRN

## 2020-11-24 MED ORDER — ONDANSETRON HCL 4 MG/2ML IJ SOLN
INTRAMUSCULAR | Status: AC
  Filled 2020-11-24: qty 2

## 2020-11-24 MED ORDER — FENTANYL CITRATE (PF) 100 MCG/2ML IJ SOLN
INTRAMUSCULAR | Status: AC
  Filled 2020-11-24: qty 2

## 2020-11-24 MED ORDER — PROPOFOL 10 MG/ML IV BOLUS
INTRAVENOUS | Status: DC | PRN
  Administered 2020-11-24: 120 mg via INTRAVENOUS

## 2020-11-24 MED ORDER — 0.9 % SODIUM CHLORIDE (POUR BTL) OPTIME
TOPICAL | Status: DC | PRN
  Administered 2020-11-24: 1000 mL

## 2020-11-24 MED ORDER — FENTANYL CITRATE (PF) 100 MCG/2ML IJ SOLN
INTRAMUSCULAR | Status: DC | PRN
  Administered 2020-11-24 (×2): 50 ug via INTRAVENOUS

## 2020-11-24 MED ORDER — ACETAMINOPHEN 10 MG/ML IV SOLN: 1000.0000 mg | Freq: Once | INTRAVENOUS | Status: DC | PRN

## 2020-11-24 MED ORDER — PROPOFOL 10 MG/ML IV BOLUS
INTRAVENOUS | Status: AC
  Filled 2020-11-24: qty 20

## 2020-11-24 MED ORDER — OXYCODONE HCL 5 MG PO TABS: 5.0000 mg | ORAL_TABLET | Freq: Once | ORAL | Status: DC | PRN

## 2020-11-24 MED ORDER — BUPIVACAINE HCL (PF) 0.25 % IJ SOLN
INTRAMUSCULAR | Status: DC | PRN
  Administered 2020-11-24: 20 mL

## 2020-11-24 MED ORDER — ACETAMINOPHEN 160 MG/5ML PO SOLN: 1000.0000 mg | Freq: Once | ORAL | Status: DC | PRN

## 2020-11-24 MED ORDER — BACITRACIN ZINC 500 UNIT/GM EX OINT
TOPICAL_OINTMENT | CUTANEOUS | Status: AC
  Filled 2020-11-24: qty 28.35

## 2020-11-24 MED ORDER — BACITRACIN ZINC 500 UNIT/GM EX OINT
TOPICAL_OINTMENT | CUTANEOUS | Status: DC | PRN
  Administered 2020-11-24: 1 via TOPICAL

## 2020-11-24 SURGICAL SUPPLY — 38 items
ADH SKN CLS APL DERMABOND .7 (GAUZE/BANDAGES/DRESSINGS)
BNDG GAUZE ELAST 4 BULKY (GAUZE/BANDAGES/DRESSINGS) ×3 IMPLANT
BRIEF STRETCH FOR OB PAD LRG (UNDERPADS AND DIAPERS) IMPLANT
COVER SURGICAL LIGHT HANDLE (MISCELLANEOUS) ×3 IMPLANT
COVER WAND RF STERILE (DRAPES) IMPLANT
DECANTER SPIKE VIAL GLASS SM (MISCELLANEOUS) ×3 IMPLANT
DERMABOND ADVANCED (GAUZE/BANDAGES/DRESSINGS)
DERMABOND ADVANCED .7 DNX12 (GAUZE/BANDAGES/DRESSINGS) IMPLANT
DRAIN PENROSE 0.25X18 (DRAIN) ×2 IMPLANT
DRAPE LAPAROTOMY T 98X78 PEDS (DRAPES) ×3 IMPLANT
ELECT NDL TIP 2.8 STRL (NEEDLE) ×1 IMPLANT
ELECT NEEDLE TIP 2.8 STRL (NEEDLE) ×3 IMPLANT
ELECT REM PT RETURN 15FT ADLT (MISCELLANEOUS) ×3 IMPLANT
GAUZE SPONGE 4X4 12PLY STRL (GAUZE/BANDAGES/DRESSINGS) ×2 IMPLANT
GLOVE BIOGEL M STRL SZ7.5 (GLOVE) ×3 IMPLANT
GOWN STRL REUS W/TWL LRG LVL3 (GOWN DISPOSABLE) ×3 IMPLANT
KIT BASIN OR (CUSTOM PROCEDURE TRAY) ×3 IMPLANT
KIT TURNOVER KIT A (KITS) IMPLANT
NEEDLE HYPO 22GX1.5 SAFETY (NEEDLE) IMPLANT
NS IRRIG 1000ML POUR BTL (IV SOLUTION) ×3 IMPLANT
PACK GENERAL/GYN (CUSTOM PROCEDURE TRAY) ×3 IMPLANT
SUPPORT SCROTAL LG STRP (MISCELLANEOUS) ×2 IMPLANT
SUPPORTER ATHLETIC LG (MISCELLANEOUS) ×1
SUT CHROMIC 3 0 SH 27 (SUTURE) ×4 IMPLANT
SUT MNCRL AB 4-0 PS2 18 (SUTURE) IMPLANT
SUT PROLENE 4 0 RB 1 (SUTURE)
SUT PROLENE 4-0 RB1 .5 CRCL 36 (SUTURE) IMPLANT
SUT SILK 0 (SUTURE) ×3
SUT SILK 0 30XBRD TIE 6 (SUTURE) IMPLANT
SUT SILK 2 0 SH (SUTURE) ×2 IMPLANT
SUT SILK 3 0 (SUTURE) ×3
SUT SILK 3-0 18XBRD TIE 12 (SUTURE) IMPLANT
SUT VIC AB 2-0 UR5 27 (SUTURE) ×2 IMPLANT
SUT VICRYL 0 TIES 12 18 (SUTURE) IMPLANT
SYR CONTROL 10ML LL (SYRINGE) IMPLANT
TOWEL OR 17X26 10 PK STRL BLUE (TOWEL DISPOSABLE) ×6 IMPLANT
TOWEL OR NON WOVEN STRL DISP B (DISPOSABLE) ×3 IMPLANT
WATER STERILE IRR 1000ML POUR (IV SOLUTION) ×3 IMPLANT

## 2020-11-24 NOTE — Anesthesia Preprocedure Evaluation (Addendum)
Anesthesia Evaluation  Patient identified by MRN, date of birth, ID band Patient awake    Reviewed: Allergy & Precautions, NPO status , Patient's Chart, lab work & pertinent test results  History of Anesthesia Complications Negative for: history of anesthetic complications  Airway Mallampati: III  TM Distance: >3 FB Neck ROM: Full    Dental  (+) Dental Advisory Given   Pulmonary neg pulmonary ROS, neg shortness of breath, neg sleep apnea, neg COPD, neg recent URI,  Covid-19 Nucleic Acid Test Results Lab Results      Component                Value               Date                      SARSCOV2NAA              NEGATIVE            11/21/2020                [place]              NEGATIVE            05/16/2020              breath sounds clear to auscultation       Cardiovascular hypertension, + CAD, + Past MI and +CHF   Rhythm:Regular  1. Left ventricular ejection fraction, by estimation, is 25 to 30%. The  left ventricle has severely decreased function. The left ventricle  demonstrates global hypokinesis. The left ventricular internal cavity size  was moderately dilated. There is mild  left ventricular hypertrophy. Left ventricular diastolic parameters were  normal.  2. Right ventricular systolic function is normal. The right ventricular  size is normal. There is mildly elevated pulmonary artery systolic  pressure.  3. Left atrial size was mildly dilated.  4. Right atrial size was mildly dilated.  5. The mitral valve is normal in structure. Moderate mitral valve  regurgitation.  6. The aortic valve is normal in structure. Aortic valve regurgitation is  trivial.    Ost 1st Diag to 1st Diag lesion is 30% stenosed.  Prox Cx lesion is 40% stenosed.  Mid LAD lesion is 40% stenosed.   No significant restenosis of stent and mild CAD and appears to have normal LVEF.    Neuro/Psych negative neurological ROS   negative psych ROS   GI/Hepatic Neg liver ROS, GERD  ,  Endo/Other  Hypothyroidism   Renal/GU negative Renal ROSLab Results      Component                Value               Date                      CREATININE               0.81                11/24/2020                Musculoskeletal negative musculoskeletal ROS (+)   Abdominal   Peds  Hematology  (+) Blood dyscrasia, anemia , Lab Results      Component                Value  Date                      WBC                      14.7 (H)            11/24/2020                HGB                      12.6 (L)            11/24/2020                HCT                      38.5 (L)            11/24/2020                MCV                      95.8                11/24/2020                PLT                      441 (H)             11/24/2020              Anesthesia Other Findings   Reproductive/Obstetrics                            Anesthesia Physical Anesthesia Plan  ASA: III  Anesthesia Plan: General   Post-op Pain Management:    Induction: Intravenous  PONV Risk Score and Plan: 2 and Ondansetron and Dexamethasone  Airway Management Planned: Oral ETT and LMA  Additional Equipment: None  Intra-op Plan:   Post-operative Plan: Extubation in OR  Informed Consent: I have reviewed the patients History and Physical, chart, labs and discussed the procedure including the risks, benefits and alternatives for the proposed anesthesia with the patient or authorized representative who has indicated his/her understanding and acceptance.     Dental advisory given  Plan Discussed with: CRNA and Surgeon  Anesthesia Plan Comments:         Anesthesia Quick Evaluation

## 2020-11-24 NOTE — H&P (View-Only) (Signed)
Urology Inpatient Progress Report         Intv/Subj: Patient with decreased O2 sats overnight requiring 2 L.  Also complaining of back pain this morning.  Principal Problem:   Testicular abscess Active Problems:   Coronary artery disease   AF (paroxysmal atrial fibrillation) (HCC)   Essential hypertension   Acute lower UTI   Chronic HFrEF (heart failure with reduced ejection fraction) ([place])  Current Facility-Administered Medications  Medication Dose Route Frequency Provider Last Rate Last Admin  . acetaminophen (TYLENOL) tablet 650 mg  650 mg Oral Q6H PRN Etta Quill, DO   650 mg at 11/23/20 1643   Or  . acetaminophen (TYLENOL) suppository 650 mg  650 mg Rectal Q6H PRN Etta Quill, DO      . amiodarone (PACERONE) tablet 200 mg  200 mg Oral Daily Lavina Hamman, MD   200 mg at 11/23/20 1027  . ceFEPIme (MAXIPIME) 2 g in sodium chloride 0.9 % 100 mL IVPB  2 g Intravenous Q8H Lavina Hamman, MD 200 mL/hr at 11/24/20 0615 2 g at 11/24/20 0615  . finasteride (PROSCAR) tablet 5 mg  5 mg Oral Daily Lavina Hamman, MD   5 mg at 11/23/20 1027  . levothyroxine (SYNTHROID) tablet 100 mcg  100 mcg Oral Q0600 Lavina Hamman, MD   100 mcg at 11/24/20 0616  . losartan (COZAAR) tablet 50 mg  50 mg Oral Daily Lavina Hamman, MD   50 mg at 11/23/20 1027  . morphine 2 MG/ML injection 2 mg  2 mg Intravenous Q2H PRN Etta Quill, DO   2 mg at 11/23/20 0336  . ondansetron (ZOFRAN) tablet 4 mg  4 mg Oral Q6H PRN Etta Quill, DO       Or  . ondansetron [facility]) injection 4 mg  4 mg Intravenous Q6H PRN Etta Quill, DO   4 mg at 11/23/20 0336  . PARoxetine (PAXIL) tablet 20 mg  20 mg Oral Daily Lavina Hamman, MD   20 mg at 11/23/20 1027  . polyethylene glycol (MIRALAX / GLYCOLAX) packet 17 g  17 g Oral Daily Lavina Hamman, MD   17 g at 11/23/20 1026  . senna-docusate (Senokot-S) tablet 1 tablet  1 tablet Oral BID Lavina Hamman, MD   1 tablet at 11/23/20 2215  .  spironolactone (ALDACTONE) tablet 12.5 mg  12.5 mg Oral Daily Lavina Hamman, MD   12.5 mg at 11/23/20 1300  . tamsulosin (FLOMAX) capsule 0.8 mg  0.8 mg Oral QPC supper Lavina Hamman, MD   0.8 mg at 11/23/20 1819  . traMADol (ULTRAM) tablet 50 mg  50 mg Oral Q6H PRN Lavina Hamman, MD      . traZODone (DESYREL) tablet 50 mg  50 mg Oral QHS Lavina Hamman, MD   50 mg at 11/23/20 2215     Objective: Vital: Vitals:   11/23/20 0617 11/23/20 1152 11/23/20 2037 11/24/20 0429  BP: (!) 151/81 (!) 146/66 (!) 154/64 (!) 161/76  Pulse: 62 (!) 59 65 (!) 59  Resp: 16 (!) 21 18 20   Temp: 98.3 F (36.8 C) 98.7 F (37.1 C) 99.5 F (37.5 C) 98.6 F (37 C)  TempSrc:  Oral Oral Oral  SpO2: 93% 92% 91% 92%  Weight:      Height:       I/Os: I/O last 3 completed shifts: In: 480 [P.O.:480] Out: [place] [Urine:1850]  Physical Exam:  General: Patient is  in no apparent distress Lungs: Normal respiratory effort, chest expands symmetrically. GI: The abdomen is soft and nontender  GU: Penile edema improved, left hemiscrotum continues to be erythematous with thickened skin, enlarged tender left testis, erythematous skin consistent with cellulitis over suprapubic area Ext: lower extremities symmetric  Lab Results: Recent Labs    11/21/20 1653 11/22/20 0601 11/23/20 0837  WBC 15.7* 12.7* 13.0*  HGB 13.0 12.2* 12.2*  HCT 40.6 37.6* 38.3*   Recent Labs    11/21/20 1653 11/22/20 0601  NA 136 139  K 3.7 4.1  CL 102 104  CO2 26 24  GLUCOSE 163* 113*  BUN 25* 21  CREATININE 0.92 0.87  CALCIUM 8.4* 7.9*   No results for input(s): LABPT, INR in the last 72 hours. No results for input(s): LABURIN in the last 72 hours. Results for orders placed or performed during the hospital encounter of 11/21/20  Resp Panel by RT-PCR (Flu A&B, Covid) Nasopharyngeal Swab     Status: None   Collection Time: 11/21/20  4:55 PM   Specimen: Nasopharyngeal Swab; Nasopharyngeal(NP) swabs in vial transport medium   Result Value Ref Range Status   SARS Coronavirus 2 by RT PCR NEGATIVE NEGATIVE Final    Comment: (NOTE) SARS-CoV-2 target nucleic acids are NOT DETECTED.  The SARS-CoV-2 RNA is generally detectable in upper respiratory specimens during the acute phase of infection. The lowest concentration of SARS-CoV-2 viral copies this assay can detect is 138 copies/mL. A negative result does not preclude SARS-Cov-2 infection and should not be used as the sole basis for treatment or other patient management decisions. A negative result may occur with  improper specimen collection/handling, submission of specimen other than nasopharyngeal swab, presence of viral mutation(s) within the areas targeted by this assay, and inadequate number of viral copies(<138 copies/mL). A negative result must be combined with clinical observations, patient history, and epidemiological information. The expected result is Negative.  Fact Sheet for Patients:  EntrepreneurPulse.com.au  Fact Sheet for Healthcare Providers:  IncredibleEmployment.be  This test is no t yet approved or cleared by the [place] FDA and  has been authorized for detection and/or diagnosis of SARS-CoV-2 by FDA under an Emergency Use Authorization (EUA). This EUA will remain  in effect (meaning this test can be used) for the duration of the COVID-19 declaration under Section 564(b)(1) of the Act, 21 U.S.C.section 360bbb-3(b)(1), unless the authorization is terminated  or revoked sooner.       Influenza A by PCR NEGATIVE NEGATIVE Final   Influenza B by PCR NEGATIVE NEGATIVE Final    Comment: (NOTE) The Xpert Xpress SARS-CoV-2/FLU/RSV plus assay is intended as an aid in the diagnosis of influenza from Nasopharyngeal swab specimens and should not be used as a sole basis for treatment. Nasal washings and aspirates are unacceptable for Xpert Xpress SARS-CoV-2/FLU/RSV testing.  Fact Sheet for Patients:  EntrepreneurPulse.com.au  Fact Sheet for Healthcare Providers: IncredibleEmployment.be  This test is not yet approved or cleared by the [place] FDA and has been authorized for detection and/or diagnosis of SARS-CoV-2 by FDA under an Emergency Use Authorization (EUA). This EUA will remain in effect (meaning this test can be used) for the duration of the COVID-19 declaration under Section 564(b)(1) of the Act, 21 U.S.C. section 360bbb-3(b)(1), unless the authorization is terminated or revoked.  Performed at [facility], [place] [address] [place], [place] [address]   Culture, Urine     Status: Abnormal (Preliminary result)   Collection Time: 11/21/20  9:05  PM   Specimen: Urine, Clean Catch  Result Value Ref Range Status   Specimen Description   Final    URINE, CLEAN CATCH Performed at [facility], [place] [address] [place], [place] [address]    Special Requests   Final    NONE Performed at [facility], [place] [address] [place], [place] [address]    Culture 20,000 COLONIES/mL PSEUDOMONAS AERUGINOSA (A)  Final   Report Status PENDING  Incomplete    Studies/Results: No results found.  Assessment: 79yo M with epididymitis, possible epididymal abscess.   - no acute urologic intervention recommended at this time.  Imaging may indicate epididymal abscess, however could also be dilated and inflamed epididymis from epididymitis and in the setting of recent eliquis and proximity to testicular structures, recommend conservative treatment at this time.  Physical exam consistent with acute epididymoorchitis and surgical intervention will be extremely challenging at this time and may possibly result in orchiectomy - agree with continued IV antibiotics, may consider expanding antibiotic coverage if no improvement in WBC this AM - Please hold Eliquis for possible intervention -Continue n.p.o. until  labs have resulted this morning and decision regarding repeat scrotal ultrasound was made - recommend scrotal elevation to help with scrotal swelling - pain control as needed   Update Patient's WBC continues to rise now 14.7.  Increasing leukocytosis combined with patient's increased oxygen requirement and physical exam findings this morning concerning for uncontrolled infection.  Discussed with patient and his wife need for scrotal exploration, washout and possible orchiectomy on the left side.  Risks and benefits of the procedure were discussed with the patient and his wife.  Will proceed to OR today.    Jacalyn Lefevre, MD Urology 11/24/2020, 7:44 AM

## 2020-11-24 NOTE — Transfer of Care (Signed)
Immediate Anesthesia Transfer of Care Note  Patient: Nathaniel Ibarra  Procedure(s) Performed: [place] EXPLORATION WITH LEFT ORCHIECTOMY (Left Perineum)  Patient Location: PACU  Anesthesia Type:General  Level of Consciousness: unresponsive, patient cooperative and responds to stimulation  Airway & Oxygen Therapy: Patient Spontanous Breathing and Patient connected to face mask oxygen  Post-op Assessment: Report given to RN and Post -op Vital signs reviewed and stable  Post vital signs: Reviewed and stable  Last Vitals:  Vitals Value Taken Time  BP 134/65 11/24/20 1507  Temp    Pulse 62 11/24/20 1509  Resp 15 11/24/20 1509  SpO2 95 % 11/24/20 1509  Vitals shown include unvalidated device data.  Last Pain:  Vitals:   11/24/20 1217  TempSrc:   PainSc: 0-No pain      Patients Stated Pain Goal: 3 (78/67/67 2094)  Complications: No complications documented.

## 2020-11-24 NOTE — Progress Notes (Addendum)
Urology Inpatient Progress Report         Intv/Subj: Patient with decreased O2 sats overnight requiring 2 L.  Also complaining of back pain this morning.  Principal Problem:   Testicular abscess Active Problems:   Coronary artery disease   AF (paroxysmal atrial fibrillation) (HCC)   Essential hypertension   Acute lower UTI   Chronic HFrEF (heart failure with reduced ejection fraction) ([place])  Current Facility-Administered Medications  Medication Dose Route Frequency Provider Last Rate Last Admin  . acetaminophen (TYLENOL) tablet 650 mg  650 mg Oral Q6H PRN Etta Quill, DO   650 mg at 11/23/20 1643   Or  . acetaminophen (TYLENOL) suppository 650 mg  650 mg Rectal Q6H PRN Etta Quill, DO      . amiodarone (PACERONE) tablet 200 mg  200 mg Oral Daily Lavina Hamman, MD   200 mg at 11/23/20 1027  . ceFEPIme (MAXIPIME) 2 g in sodium chloride 0.9 % 100 mL IVPB  2 g Intravenous Q8H Lavina Hamman, MD 200 mL/hr at 11/24/20 0615 2 g at 11/24/20 0615  . finasteride (PROSCAR) tablet 5 mg  5 mg Oral Daily Lavina Hamman, MD   5 mg at 11/23/20 1027  . levothyroxine (SYNTHROID) tablet 100 mcg  100 mcg Oral Q0600 Lavina Hamman, MD   100 mcg at 11/24/20 0616  . losartan (COZAAR) tablet 50 mg  50 mg Oral Daily Lavina Hamman, MD   50 mg at 11/23/20 1027  . morphine 2 MG/ML injection 2 mg  2 mg Intravenous Q2H PRN Etta Quill, DO   2 mg at 11/23/20 0336  . ondansetron (ZOFRAN) tablet 4 mg  4 mg Oral Q6H PRN Etta Quill, DO       Or  . ondansetron [facility]) injection 4 mg  4 mg Intravenous Q6H PRN Etta Quill, DO   4 mg at 11/23/20 0336  . PARoxetine (PAXIL) tablet 20 mg  20 mg Oral Daily Lavina Hamman, MD   20 mg at 11/23/20 1027  . polyethylene glycol (MIRALAX / GLYCOLAX) packet 17 g  17 g Oral Daily Lavina Hamman, MD   17 g at 11/23/20 1026  . senna-docusate (Senokot-S) tablet 1 tablet  1 tablet Oral BID Lavina Hamman, MD   1 tablet at 11/23/20 2215  .  spironolactone (ALDACTONE) tablet 12.5 mg  12.5 mg Oral Daily Lavina Hamman, MD   12.5 mg at 11/23/20 1300  . tamsulosin (FLOMAX) capsule 0.8 mg  0.8 mg Oral QPC supper Lavina Hamman, MD   0.8 mg at 11/23/20 1819  . traMADol (ULTRAM) tablet 50 mg  50 mg Oral Q6H PRN Lavina Hamman, MD      . traZODone (DESYREL) tablet 50 mg  50 mg Oral QHS Lavina Hamman, MD   50 mg at 11/23/20 2215     Objective: Vital: Vitals:   11/23/20 0617 11/23/20 1152 11/23/20 2037 11/24/20 0429  BP: (!) 151/81 (!) 146/66 (!) 154/64 (!) 161/76  Pulse: 62 (!) 59 65 (!) 59  Resp: 16 (!) 21 18 20   Temp: 98.3 F (36.8 C) 98.7 F (37.1 C) 99.5 F (37.5 C) 98.6 F (37 C)  TempSrc:  Oral Oral Oral  SpO2: 93% 92% 91% 92%  Weight:      Height:       I/Os: I/O last 3 completed shifts: In: 480 [P.O.:480] Out: [place] [Urine:1850]  Physical Exam:  General: Patient is  in no apparent distress Lungs: Normal respiratory effort, chest expands symmetrically. GI: The abdomen is soft and nontender  GU: Penile edema improved, left hemiscrotum continues to be erythematous with thickened skin, enlarged tender left testis, erythematous skin consistent with cellulitis over suprapubic area Ext: lower extremities symmetric  Lab Results: Recent Labs    11/21/20 1653 11/22/20 0601 11/23/20 0837  WBC 15.7* 12.7* 13.0*  HGB 13.0 12.2* 12.2*  HCT 40.6 37.6* 38.3*   Recent Labs    11/21/20 1653 11/22/20 0601  NA 136 139  K 3.7 4.1  CL 102 104  CO2 26 24  GLUCOSE 163* 113*  BUN 25* 21  CREATININE 0.92 0.87  CALCIUM 8.4* 7.9*   No results for input(s): LABPT, INR in the last 72 hours. No results for input(s): LABURIN in the last 72 hours. Results for orders placed or performed during the hospital encounter of 11/21/20  Resp Panel by RT-PCR (Flu A&B, Covid) Nasopharyngeal Swab     Status: None   Collection Time: 11/21/20  4:55 PM   Specimen: Nasopharyngeal Swab; Nasopharyngeal(NP) swabs in vial transport medium   Result Value Ref Range Status   SARS Coronavirus 2 by RT PCR NEGATIVE NEGATIVE Final    Comment: (NOTE) SARS-CoV-2 target nucleic acids are NOT DETECTED.  The SARS-CoV-2 RNA is generally detectable in upper respiratory specimens during the acute phase of infection. The lowest concentration of SARS-CoV-2 viral copies this assay can detect is 138 copies/mL. A negative result does not preclude SARS-Cov-2 infection and should not be used as the sole basis for treatment or other patient management decisions. A negative result may occur with  improper specimen collection/handling, submission of specimen other than nasopharyngeal swab, presence of viral mutation(s) within the areas targeted by this assay, and inadequate number of viral copies(<138 copies/mL). A negative result must be combined with clinical observations, patient history, and epidemiological information. The expected result is Negative.  Fact Sheet for Patients:  EntrepreneurPulse.com.au  Fact Sheet for Healthcare Providers:  IncredibleEmployment.be  This test is no t yet approved or cleared by the [place] FDA and  has been authorized for detection and/or diagnosis of SARS-CoV-2 by FDA under an Emergency Use Authorization (EUA). This EUA will remain  in effect (meaning this test can be used) for the duration of the COVID-19 declaration under Section 564(b)(1) of the Act, 21 U.S.C.section 360bbb-3(b)(1), unless the authorization is terminated  or revoked sooner.       Influenza A by PCR NEGATIVE NEGATIVE Final   Influenza B by PCR NEGATIVE NEGATIVE Final    Comment: (NOTE) The Xpert Xpress SARS-CoV-2/FLU/RSV plus assay is intended as an aid in the diagnosis of influenza from Nasopharyngeal swab specimens and should not be used as a sole basis for treatment. Nasal washings and aspirates are unacceptable for Xpert Xpress SARS-CoV-2/FLU/RSV testing.  Fact Sheet for  Patients: EntrepreneurPulse.com.au  Fact Sheet for Healthcare Providers: IncredibleEmployment.be  This test is not yet approved or cleared by the [place] FDA and has been authorized for detection and/or diagnosis of SARS-CoV-2 by FDA under an Emergency Use Authorization (EUA). This EUA will remain in effect (meaning this test can be used) for the duration of the COVID-19 declaration under Section 564(b)(1) of the Act, 21 U.S.C. section 360bbb-3(b)(1), unless the authorization is terminated or revoked.  Performed at [facility], [place] [address] [place], [place] [address]   Culture, Urine     Status: Abnormal (Preliminary result)   Collection Time: 11/21/20  9:05  PM   Specimen: Urine, Clean Catch  Result Value Ref Range Status   Specimen Description   Final    URINE, CLEAN CATCH Performed at [facility], [place] [address] [place], [place] [address]    Special Requests   Final    NONE Performed at [facility], [place] [address], [place], [place] [address]    Culture 20,000 COLONIES/mL PSEUDOMONAS AERUGINOSA (A)  Final   Report Status PENDING  Incomplete    Studies/Results: No results found.  Assessment: 79yo M with epididymitis, possible epididymal abscess.   - no acute urologic intervention recommended at this time.  Imaging may indicate epididymal abscess, however could also be dilated and inflamed epididymis from epididymitis and in the setting of recent eliquis and proximity to testicular structures, recommend conservative treatment at this time.  Physical exam consistent with acute epididymoorchitis and surgical intervention will be extremely challenging at this time and may possibly result in orchiectomy - agree with continued IV antibiotics, may consider expanding antibiotic coverage if no improvement in WBC this AM - Please hold Eliquis for possible intervention -Continue  n.p.o. until labs have resulted this morning and decision regarding repeat scrotal ultrasound was made - recommend scrotal elevation to help with scrotal swelling - pain control as needed   Update Patient's WBC continues to rise now 14.7.  Increasing leukocytosis combined with patient's increased oxygen requirement and physical exam findings this morning concerning for uncontrolled infection.  Discussed with patient and his wife need for scrotal exploration, washout and possible orchiectomy on the left side.  Risks and benefits of the procedure were discussed with the patient and his wife.  Will proceed to OR today.    Jacalyn Lefevre, MD Urology 11/24/2020, 7:44 AM

## 2020-11-24 NOTE — Progress Notes (Signed)
[facility] Progress Note  Patient: Nathaniel Ibarra    LDJ:[id number]  DOA: 11/21/2020     Date of Service: the patient was seen and examined on 11/24/2020  Brief hospital course: Past medical history of HFrEF, CAD, HTN, bladder cancer, PAF on Eliquis.  Now presents with complaints of dysuria found to have epididymoorchitis.  SP left scrotal exploration and left simple orchiectomy Currently plan is continue IV antibiotics, monitor culture and recovery.  Assessment and Plan: 1.  UTI, Serratia and Pseudomonas Testicular abscess versus epididymitis Urology following. Urology is suggested that the outpatient culture was positive for Serratia. Culture urine is positive for Pseudomonas. Initially was on IV Rocephin. Currently on IV cefepime. Urology initially was planning conservative measures but due to worsening leukocytosis despite being on the antibiotic patient was taken for I&D of the abscess with simple orchiectomy. Monitor cultures.  2.  Chronic systolic CHF. Essential hypertension. Paroxysmal A. fib, rate controlled CAD Continue statin, continue amiodarone, continue beta-blocker. Continuing ARB as well. Holding Eliquis.  Monitor.  3.  Hypothyroidism Continue Synthroid.  4.  BPH Continue home regimen.  5.  Hypoxia. Incentive Spirometer. Chest x-ray. Monitor results.  Diet: Cardiac diet DVT Prophylaxis:   SCDs Start: 11/21/20 2009    Advance goals of care discussion: Full code  Family Communication: family was present at bedside, at the time of interview.  The pt provided permission to discuss medical plan with the family. Opportunity was given to ask question and all questions were answered satisfactorily.   Disposition:  Status is: Inpatient  Remains inpatient appropriate because:Ongoing diagnostic testing needed not appropriate for outpatient work up and Inpatient level of care appropriate due to severity of illness   Dispo: The patient is from:  Home              Anticipated d/c is to: Home              Anticipated d/c date is: 2 days              Patient currently is not medically stable to d/c.  Subjective: No nausea no vomiting but no fever no chills with  Physical Exam:  General: Appear in mild distress, no Rash; Oral Mucosa Clear, moist. no Abnormal Neck Mass Or lumps, Conjunctiva normal  Cardiovascular: S1 and S2 Present, no Murmur, Respiratory: increased respiratory effort, Bilateral Air entry present and bilateral  Crackles, no wheezes Abdomen: Bowel Sound present, Soft and no tenderness Extremities: no Pedal edema Neurology: alert and oriented to time, place, and person affect appropriate. no new focal deficit Gait not checked due to patient safety concerns  Vitals:   11/24/20 1530 11/24/20 1545 11/24/20 1600 11/24/20 1616  BP: (!) 146/62 131/70 138/65 (!) 143/63  Pulse: (!) 59 (!) 55 (!) 55 (!) 55  Resp: 17 11 14 14   Temp:   (!) 97 F (36.1 C) (!) 97.5 F (36.4 C)  TempSrc:      SpO2: 92% 92% 93% 96%  Weight:      Height:        Intake/Output Summary (Last 24 hours) at 11/24/2020 1942 Last data filed at 11/24/2020 1800 Gross per 24 hour  Intake 300 ml  Output 1125 ml  Net -825 ml   Filed Weights   11/21/20 1121 11/24/20 1217  Weight: 90.7 kg 90.7 kg    Data Reviewed: I have personally reviewed and interpreted daily labs, tele strips, imagings as discussed above. I reviewed all nursing notes, pharmacy notes, vitals, pertinent  old records I have discussed plan of care as described above with RN and patient/family.  CBC: Recent Labs  Lab 11/21/20 1653 11/22/20 0601 11/23/20 0837 11/24/20 0755  WBC 15.7* 12.7* 13.0* 14.7*  NEUTROABS 13.3*  --  10.4* 11.9*  HGB 13.0 12.2* 12.2* 12.6*  HCT 40.6 37.6* 38.3* 38.5*  MCV 98.5 97.7 97.7 95.8  PLT 359 340 366 614*   Basic Metabolic Panel: Recent Labs  Lab 11/21/20 1653 11/22/20 0601 11/24/20 0755  NA 136 139 136  K 3.7 4.1 4.5  CL 102 104 99   CO2 26 24 26   GLUCOSE 163* 113* 114*  BUN 25* 21 22  CREATININE 0.92 0.87 0.81  CALCIUM 8.4* 7.9* 8.2*    Studies: No results found.  Scheduled Meds: . amiodarone  200 mg Oral Daily  . fentaNYL      . finasteride  5 mg Oral Daily  . levothyroxine  100 mcg Oral Q0600  . losartan  50 mg Oral Daily  . PARoxetine  20 mg Oral Daily  . polyethylene glycol  17 g Oral Daily  . senna-docusate  1 tablet Oral BID  . spironolactone  12.5 mg Oral Daily  . tamsulosin  0.8 mg Oral QPC supper  . traZODone  50 mg Oral QHS   Continuous Infusions: . ceFEPime (MAXIPIME) IV 2 g (11/24/20 0615)   PRN Meds: acetaminophen **OR** acetaminophen, morphine injection, ondansetron **OR** ondansetron (ZOFRAN) IV, traMADol  Time spent: 35 minutes  Author: Berle Mull, MD [facility] 11/24/2020 7:42 PM  To reach On-call, see care teams to locate the attending and reach out via [email or web address]. Between 7PM-7AM, please contact night-coverage If you still have difficulty reaching the attending provider, please page the [facility] (Director on Call) for [facility] on amion for assistance.

## 2020-11-24 NOTE — Op Note (Signed)
Operative Note  Preoperative diagnosis:  1.  Left epididymitis with abscess  Postoperative diagnosis: 1.  Same  Procedure(s): 1.  Left scrotal exploration, left simple orchiectomy  Surgeon: Jacalyn Lefevre, MD  Assistants:  None  Anesthesia:  General  Complications:  None  EBL:  25 mL  Specimens: 1. Left testicle   Drains/Catheters: 1.  None  Intraoperative findings:   1. Enlarged, edematous left epididymis with fluid collection consistent with abscess  Indication:  Nathaniel Ibarra is a 79 y.o. male with recent BPH procedure [place]um complicated by UTI and subsequent left epididymitis with evolving abscess.    Description of procedure:  After informed consent the patient was brought to the major OR, placed on the table and administered general anesthesia. His genitalia was then sterilely prepped and draped. An official timeout was then performed.  A left transvere hemiscrotal incision was then made and carried down over the testicle.  The testicle was noted to be enlarged and edematous with adherent tunica vaginalis.  Dissection continued until the tunica vaginalis could be opened.  There was a hydrocele noted as well as what appeared to be epididymal abscess.  The spermatic cord was also edematous.  Given the degree of edema and patient's infection the decision was made to proceed with a left simple orchiectomy.  The spermatic cord was ligated with 0 silk.  The vas deferens was taken separately from the blood supply.  The left testicle and epididymis were sharply excised and sent to pathology.  The left hemiscrotum was then copiously irrigated with antibiotic irrigant.  Hemostasis was achieved.  1/4 inch Penrose drain was then placed in the dependent part of the hemiscrotum.  Then closed the deep scrotal tissue with interrupted 2-0 vicryl suture. I injected quarter percent Marcaine in the subcutaneous tissue and closed the skin with horizontal mattress 2-0 chromic. Neosporin, a  sterile gauze dressing, fluff Kerlix and a scrotal support were applied. The patient tolerated the procedure well no intraoperative complications. Needle sponge and instrument counts were correct at the end of the operation.   The patient emerged from anesthesia without complication.  He was transferred the PACU in stable condition.  Disposition: stable  Plan:  Patient to continue on IV cefipime and continue drain.  Will re-examine tomorrow.

## 2020-11-24 NOTE — Care Management Important Message (Signed)
Important Message  Patient Details IM Letter given to the Patient. Name: Nathaniel Ibarra MRN: [id number] Date of Birth: [birth year]   Medicare Important Message Given:  Yes     Kerin Salen 11/24/2020, 2:01 PM

## 2020-11-24 NOTE — TOC Initial Note (Signed)
Transition of Care [facility]) - Initial/Assessment Note    Patient Details  Name: Nathaniel Ibarra MRN: [id number] Date of Birth: June 01, 1941  [facility] CM/SW Contact:    Dessa Phi, RN Phone Number: 11/24/2020, 1:48 PM  Clinical Narrative:Patient I surgery-spoke to spouse-patient Indep PTA,no dme. Await post surgery for d/c needs.                   Expected Discharge Plan: Home/Self Care Barriers to Discharge: Continued Medical Work up   Patient Goals and CMS Choice Patient states their goals for this hospitalization and ongoing recovery are:: go home      Expected Discharge Plan and Services Expected Discharge Plan: Home/Self Care   Discharge Planning Services: CM Consult   Living arrangements for the past 2 months: Single Family Home                                      Prior Living Arrangements/Services Living arrangements for the past 2 months: Single Family Home Lives with:: Spouse Patient language and need for interpreter reviewed:: Yes Do you feel safe going back to the place where you live?: Yes      Need for Family Participation in Patient Care: No (Comment) Care giver support system in place?: Yes (comment)   Criminal Activity/Legal Involvement Pertinent to Current Situation/Hospitalization: No - Comment as needed  Activities of Daily Living Home Assistive Devices/Equipment: None ADL Screening (condition at time of admission) Patient's cognitive ability adequate to safely complete daily activities?: Yes Is the patient deaf or have difficulty hearing?: No Does the patient have difficulty seeing, even when wearing glasses/contacts?: No Does the patient have difficulty concentrating, remembering, or making decisions?: No Patient able to express need for assistance with ADLs?: No Does the patient have difficulty dressing or bathing?: No Independently performs ADLs?: Yes (appropriate for developmental age) Does the patient have  difficulty walking or climbing stairs?: No Weakness of Legs: None Weakness of Arms/Hands: None  Permission Sought/Granted   Permission granted to share information with : Yes, Verbal Permission Granted  Share Information with NAME: Case Manager     Permission granted to share info w Relationship: [name] spouse [phone]     Emotional Assessment Appearance:: Appears stated age Attitude/Demeanor/Rapport: Gracious Affect (typically observed): Accepting Orientation: : Oriented to Self, Oriented to Place, Oriented to  Time, Oriented to Situation Alcohol / Substance Use: Not Applicable Psych Involvement: No (comment)  Admission diagnosis:  Epididymal abscess [N45.4] Testicular abscess [N45.4] Patient Active Problem List   Diagnosis Date Noted  . Testicular abscess 11/21/2020  . Acute lower UTI 11/21/2020  . Chronic HFrEF (heart failure with reduced ejection fraction) ([place]) 11/21/2020  . AF (paroxysmal atrial fibrillation) ([place])   . Essential hypertension   . Hypothyroidism   . Acute on chronic combined systolic and diastolic CHF (congestive heart failure) ([place]) 05/16/2020  . Acute on chronic systolic CHF (congestive heart failure) ([place]) 05/16/2020  . BPH (benign prostatic hyperplasia)   . GERD (gastroesophageal reflux disease)   . Hypertensive urgency   . CHF (congestive heart failure) ([place]) 11/13/2017  . Atrial fibrillation with RVR ([place]) 11/12/2017  . Pulmonary edema cardiac cause ([place]) 11/12/2017  . Elevated troponin 11/12/2017  . Coronary artery disease 09/02/2015  . Acute coronary syndrome ([place]) 09/01/2015  . Unstable angina ([place]) 09/01/2015  . Chest pain 08/16/2015   PCP:  [name], MD  Pharmacy:   [id number] [place], [place] [address] [place] [address] Phone: [phone] Fax: [phone]     Social Determinants of Health (SDOH) Interventions    Readmission Risk Interventions Readmission Risk Prevention Plan 11/24/2020   Post Dischage Appt Complete  Medication Screening Complete  Transportation Screening Complete  Some recent data might be hidden

## 2020-11-24 NOTE — Anesthesia Procedure Notes (Signed)
Procedure Name: LMA Insertion Performed by: Gean Maidens, CRNA Pre-anesthesia Checklist: Suction available, Emergency Drugs available, Patient identified, Patient being monitored and Timeout performed Patient Re-evaluated:Patient Re-evaluated prior to induction Oxygen Delivery Method: Circle system utilized Preoxygenation: Pre-oxygenation with 100% oxygen Induction Type: IV induction Ventilation: Mask ventilation without difficulty LMA: LMA inserted LMA Size: 4.0 Number of attempts: 1 Placement Confirmation: positive ETCO2 and breath sounds checked- equal and bilateral Tube secured with: Tape Dental Injury: Teeth and Oropharynx as per pre-operative assessment

## 2020-11-24 NOTE — Interval H&P Note (Signed)
History and Physical Interval Note:  11/24/2020 12:41 PM  Nathaniel Ibarra  has presented today for surgery, with the diagnosis of scrotal abcess.  The various methods of treatment have been discussed with the patient and family. After consideration of risks, benefits and other options for treatment, the patient has consented to  Procedure(s): SCROTUM EXPLORATION POSSIBLE IRRIGATION AND DEBRIDIMENT POSSIBLE LEFT ORCHIECTOMY (Left) as a surgical intervention.  The patient's history has been reviewed, patient examined, no change in status, stable for surgery.  I have reviewed the patient's chart and labs.  Questions were answered to the patient's satisfaction.     Nathaniel Ibarra

## 2020-11-25 ENCOUNTER — Inpatient Hospital Stay (HOSPITAL_COMMUNITY): Payer: PPO

## 2020-11-25 ENCOUNTER — Encounter (HOSPITAL_COMMUNITY): Payer: Self-pay | Admitting: Urology

## 2020-11-25 DIAGNOSIS — I5023 Acute on chronic systolic (congestive) heart failure: Secondary | ICD-10-CM

## 2020-11-25 DIAGNOSIS — J189 Pneumonia, unspecified organism: Secondary | ICD-10-CM

## 2020-11-25 LAB — CBC WITH DIFFERENTIAL/PLATELET
Abs Immature Granulocytes: 0.2 10*3/uL — ABNORMAL HIGH (ref 0.00–0.07)
Basophils Absolute: 0 10*3/uL (ref 0.0–0.1)
Basophils Relative: 0 %
Eosinophils Absolute: 0 10*3/uL (ref 0.0–0.5)
Eosinophils Relative: 0 %
HCT: 37.4 % — ABNORMAL LOW (ref 39.0–52.0)
Hemoglobin: 12 g/dL — ABNORMAL LOW (ref 13.0–17.0)
Immature Granulocytes: 1 %
Lymphocytes Relative: 4 %
Lymphs Abs: 0.7 10*3/uL (ref 0.7–4.0)
MCH: 31.1 pg (ref 26.0–34.0)
MCHC: 32.1 g/dL (ref 30.0–36.0)
MCV: 96.9 fL (ref 80.0–100.0)
Monocytes Absolute: 0.7 10*3/uL (ref 0.1–1.0)
Monocytes Relative: 4 %
Neutro Abs: 14.7 10*3/uL — ABNORMAL HIGH (ref 1.7–7.7)
Neutrophils Relative %: 91 %
Platelets: 371 10*3/uL (ref 150–400)
RBC: 3.86 MIL/uL — ABNORMAL LOW (ref 4.22–5.81)
RDW: 11.8 % (ref 11.5–15.5)
WBC: 16.4 10*3/uL — ABNORMAL HIGH (ref 4.0–10.5)
nRBC: 0 % (ref 0.0–0.2)

## 2020-11-25 LAB — COMPREHENSIVE METABOLIC PANEL
ALT: 61 U/L — ABNORMAL HIGH (ref 0–44)
AST: 39 U/L (ref 15–41)
Albumin: 2.6 g/dL — ABNORMAL LOW (ref 3.5–5.0)
Alkaline Phosphatase: 77 U/L (ref 38–126)
Anion gap: 10 (ref 5–15)
BUN: 23 mg/dL (ref 8–23)
CO2: 26 mmol/L (ref 22–32)
Calcium: 8.2 mg/dL — ABNORMAL LOW (ref 8.9–10.3)
Chloride: 100 mmol/L (ref 98–111)
Creatinine, Ser: 0.78 mg/dL (ref 0.61–1.24)
GFR, Estimated: >60 mL/min (ref >60)
Glucose, Bld: 186 mg/dL — ABNORMAL HIGH (ref 70–99)
Potassium: 4.4 mmol/L (ref 3.5–5.1)
Sodium: 136 mmol/L (ref 135–145)
Total Bilirubin: 0.3 mg/dL (ref 0.3–1.2)
Total Protein: 5.9 g/dL — ABNORMAL LOW (ref 6.5–8.1)

## 2020-11-25 LAB — MAGNESIUM: Magnesium: 2.3 mg/dL (ref 1.7–2.4)

## 2020-11-25 MED ORDER — INFLUENZA VAC A&B SA ADJ QUAD 0.5 ML IM PRSY
0.5000 mL | PREFILLED_SYRINGE | INTRAMUSCULAR | Status: DC
  Filled 2020-11-25: qty 0.5

## 2020-11-25 MED ORDER — FUROSEMIDE 10 MG/ML IJ SOLN
40.0000 mg | Freq: Every day | INTRAMUSCULAR | Status: DC
  Administered 2020-11-25 – 2020-11-26 (×2): 40 mg via INTRAVENOUS
  Filled 2020-11-25 (×2): qty 4

## 2020-11-25 MED ORDER — SODIUM CHLORIDE 0.9 % IV SOLN
INTRAVENOUS | Status: DC | PRN
  Administered 2020-11-25 – 2020-11-26 (×2): 250 mL via INTRAVENOUS

## 2020-11-25 MED ORDER — AZITHROMYCIN 250 MG PO TABS
500.0000 mg | ORAL_TABLET | Freq: Every day | ORAL | Status: DC
Start: 2020-11-25 — End: 2020-11-25

## 2020-11-25 MED ORDER — DOXYCYCLINE HYCLATE 100 MG PO TABS
100.0000 mg | ORAL_TABLET | Freq: Two times a day (BID) | ORAL | Status: DC
  Administered 2020-11-25 – 2020-11-27 (×5): 100 mg via ORAL
  Filled 2020-11-25 (×5): qty 1

## 2020-11-25 MED ORDER — AZITHROMYCIN 250 MG PO TABS
250.0000 mg | ORAL_TABLET | Freq: Every day | ORAL | Status: DC
Start: 2020-11-26 — End: 2020-11-25

## 2020-11-25 NOTE — Anesthesia Postprocedure Evaluation (Signed)
Anesthesia Post Note  Patient: Nathaniel Ibarra  Procedure(s) Performed: [place] EXPLORATION WITH LEFT ORCHIECTOMY (Left Perineum)     Patient location during evaluation: PACU Anesthesia Type: General Level of consciousness: awake and alert Pain management: pain level controlled Vital Signs Assessment: post-procedure vital signs reviewed and stable Respiratory status: spontaneous breathing, nonlabored ventilation, respiratory function stable and patient connected to nasal cannula oxygen Cardiovascular status: blood pressure returned to baseline and stable Postop Assessment: no apparent nausea or vomiting Anesthetic complications: no   No complications documented.  Last Vitals:  Vitals:   11/24/20 2056 11/25/20 0540  BP: (!) 151/72 (!) 153/69  Pulse: (!) 56 (!) 53  Resp: 18 18  Temp: (!) 36.4 C 36.4 C  SpO2: 95% 97%    Last Pain:  Vitals:   11/25/20 0828  TempSrc:   PainSc: 4                  Marisela Line

## 2020-11-25 NOTE — Progress Notes (Addendum)
Urology Inpatient Progress Report    1 Day Post-Op   Intv/Subj: No acute events overnight. Patient pain improved after surgery yesterday.  Voiding with condom catheter.  Still requiring O2.   Principal Problem:   Testicular abscess Active Problems:   Coronary artery disease   AF (paroxysmal atrial fibrillation) (HCC)   Essential hypertension   Acute lower UTI   Chronic HFrEF (heart failure with reduced ejection fraction) ([place])  Current Facility-Administered Medications  Medication Dose Route Frequency Provider Last Rate Last Admin  . acetaminophen (TYLENOL) tablet 650 mg  650 mg Oral Q6H PRN Etta Quill, DO   650 mg at 11/24/20 0174   Or  . acetaminophen (TYLENOL) suppository 650 mg  650 mg Rectal Q6H PRN Etta Quill, DO      . amiodarone (PACERONE) tablet 200 mg  200 mg Oral Daily Lavina Hamman, MD   200 mg at 11/24/20 9449  . ceFEPIme (MAXIPIME) 2 g in sodium chloride 0.9 % 100 mL IVPB  2 g Intravenous Q8H Lavina Hamman, MD 200 mL/hr at 11/25/20 0508 2 g at 11/25/20 0508  . finasteride (PROSCAR) tablet 5 mg  5 mg Oral Daily Lavina Hamman, MD   5 mg at 11/24/20 6759  . levothyroxine (SYNTHROID) tablet 100 mcg  100 mcg Oral Q0600 Lavina Hamman, MD   100 mcg at 11/25/20 1638  . losartan (COZAAR) tablet 50 mg  50 mg Oral Daily Lavina Hamman, MD   50 mg at 11/24/20 4665  . morphine 2 MG/ML injection 2 mg  2 mg Intravenous Q2H PRN Etta Quill, DO   2 mg at 11/23/20 0336  . ondansetron (ZOFRAN) tablet 4 mg  4 mg Oral Q6H PRN Etta Quill, DO       Or  . ondansetron [facility]) injection 4 mg  4 mg Intravenous Q6H PRN Etta Quill, DO   4 mg at 11/23/20 0336  . PARoxetine (PAXIL) tablet 20 mg  20 mg Oral Daily Lavina Hamman, MD   20 mg at 11/24/20 9935  . polyethylene glycol (MIRALAX / GLYCOLAX) packet 17 g  17 g Oral Daily Lavina Hamman, MD   17 g at 11/23/20 1026  . senna-docusate (Senokot-S) tablet 1 tablet  1 tablet Oral BID Lavina Hamman, MD   1  tablet at 11/24/20 2125  . spironolactone (ALDACTONE) tablet 12.5 mg  12.5 mg Oral Daily Lavina Hamman, MD   12.5 mg at 11/24/20 7017  . tamsulosin (FLOMAX) capsule 0.8 mg  0.8 mg Oral QPC supper Lavina Hamman, MD   0.8 mg at 11/23/20 1819  . traMADol (ULTRAM) tablet 50 mg  50 mg Oral Q6H PRN Lavina Hamman, MD   50 mg at 11/25/20 0514  . traZODone (DESYREL) tablet 50 mg  50 mg Oral QHS Lavina Hamman, MD   50 mg at 11/24/20 2125     Objective: Vital: Vitals:   11/24/20 1600 11/24/20 1616 11/24/20 2056 11/25/20 0540  BP: 138/65 (!) 143/63 (!) 151/72 (!) 153/69  Pulse: (!) 55 (!) 55 (!) 56 (!) 53  Resp: 14 14 18 18   Temp: (!) 97 F (36.1 C) (!) 97.5 F (36.4 C) (!) 97.5 F (36.4 C) 97.6 F (36.4 C)  TempSrc:   Oral Oral  SpO2: 93% 96% 95% 97%  Weight:      Height:       I/Os: I/O last 3 completed shifts: In: 87 [P.O.:480; I.V.:300]  Out: 8338 [Urine:1700; Blood:25]  Physical Exam:  General: Patient is in no apparent distress Lungs: Normal respiratory effort, chest expands symmetrically. GI: The abdomen is soft and nontender GU: left hemiscrotal incision c/d/i, swelling improved from yesterday, penile edema continues to improve, penrose drain with serosanguinous drainage, improving suprapubic erythema and tenderness Foley: condom catheter with clear yellow urine Ext: lower extremities symmetric  Lab Results: Recent Labs    11/23/20 0837 11/24/20 0755  WBC 13.0* 14.7*  HGB 12.2* 12.6*  HCT 38.3* 38.5*   Recent Labs    11/24/20 0755  NA 136  K 4.5  CL 99  CO2 26  GLUCOSE 114*  BUN 22  CREATININE 0.81  CALCIUM 8.2*   No results for input(s): LABPT, INR in the last 72 hours. No results for input(s): LABURIN in the last 72 hours. Results for orders placed or performed during the hospital encounter of 11/21/20  Resp Panel by RT-PCR (Flu A&B, Covid) Nasopharyngeal Swab     Status: None   Collection Time: 11/21/20  4:55 PM   Specimen: Nasopharyngeal Swab;  Nasopharyngeal(NP) swabs in vial transport medium  Result Value Ref Range Status   SARS Coronavirus 2 by RT PCR NEGATIVE NEGATIVE Final    Comment: (NOTE) SARS-CoV-2 target nucleic acids are NOT DETECTED.  The SARS-CoV-2 RNA is generally detectable in upper respiratory specimens during the acute phase of infection. The lowest concentration of SARS-CoV-2 viral copies this assay can detect is 138 copies/mL. A negative result does not preclude SARS-Cov-2 infection and should not be used as the sole basis for treatment or other patient management decisions. A negative result may occur with  improper specimen collection/handling, submission of specimen other than nasopharyngeal swab, presence of viral mutation(s) within the areas targeted by this assay, and inadequate number of viral copies(<138 copies/mL). A negative result must be combined with clinical observations, patient history, and epidemiological information. The expected result is Negative.  Fact Sheet for Patients:  EntrepreneurPulse.com.au  Fact Sheet for Healthcare Providers:  IncredibleEmployment.be  This test is no t yet approved or cleared by the [place] FDA and  has been authorized for detection and/or diagnosis of SARS-CoV-2 by FDA under an Emergency Use Authorization (EUA). This EUA will remain  in effect (meaning this test can be used) for the duration of the COVID-19 declaration under Section 564(b)(1) of the Act, 21 U.S.C.section 360bbb-3(b)(1), unless the authorization is terminated  or revoked sooner.       Influenza A by PCR NEGATIVE NEGATIVE Final   Influenza B by PCR NEGATIVE NEGATIVE Final    Comment: (NOTE) The Xpert Xpress SARS-CoV-2/FLU/RSV plus assay is intended as an aid in the diagnosis of influenza from Nasopharyngeal swab specimens and should not be used as a sole basis for treatment. Nasal washings and aspirates are unacceptable for Xpert Xpress  SARS-CoV-2/FLU/RSV testing.  Fact Sheet for Patients: EntrepreneurPulse.com.au  Fact Sheet for Healthcare Providers: IncredibleEmployment.be  This test is not yet approved or cleared by the [place] FDA and has been authorized for detection and/or diagnosis of SARS-CoV-2 by FDA under an Emergency Use Authorization (EUA). This EUA will remain in effect (meaning this test can be used) for the duration of the COVID-19 declaration under Section 564(b)(1) of the Act, 21 U.S.C. section 360bbb-3(b)(1), unless the authorization is terminated or revoked.  Performed at [facility], [place] [address], [place], [place] [address]   Culture, Urine     Status: Abnormal   Collection Time: 11/21/20  9:05 PM  Specimen: Urine, Clean Catch  Result Value Ref Range Status   Specimen Description   Final    URINE, CLEAN CATCH Performed at [facility], [place] [address], [place], [place] [address]    Special Requests   Final    NONE Performed at [facility], [place] [address], [place], [place] [address]    Culture 20,000 COLONIES/mL PSEUDOMONAS AERUGINOSA (A)  Final   Report Status 11/24/2020 FINAL  Final   Organism ID, Bacteria PSEUDOMONAS AERUGINOSA (A)  Final      Susceptibility   Pseudomonas aeruginosa - MIC*    CEFTAZIDIME 2 SENSITIVE Sensitive     CIPROFLOXACIN <=0.25 SENSITIVE Sensitive     GENTAMICIN <=1 SENSITIVE Sensitive     IMIPENEM <=0.25 SENSITIVE Sensitive     PIP/TAZO <=4 SENSITIVE Sensitive     CEFEPIME 2 SENSITIVE Sensitive     * 20,000 COLONIES/mL PSEUDOMONAS AERUGINOSA  Aerobic/Anaerobic Culture (surgical/deep wound)     Status: None (Preliminary result)   Collection Time: 11/24/20  2:16 PM   Specimen: PATH Other; Tissue  Result Value Ref Range Status   Specimen Description   Final    ABSCESS SCROTUM Performed at [facility], [place] [address] [place], [place] [address]     Special Requests   Final    NONE Performed at [facility], [place] [address] [place], [place] [address]    Gram Stain PENDING  Incomplete   Culture PENDING  Incomplete   Report Status PENDING  Incomplete    Studies/Results: CXR IMPRESSION: 1. Cardiomegaly and vascular congestion. 2. Subtle asymmetric density over the left mid chest, please correlate for pneumonia symptoms.   Electronically Signed   By: Monte Fantasia M.D.   On: 11/25/2020 07:27   Assessment: 79yo M with epididymitis, possible epididymal abscess now POD1 s/p left scrotal exploration and orchiectomy with pain improving.  CBC pending this AM.      - dressing changed this AM with minimal drainage from penrose, tentative plans to d/c penrose tomorrow - recommend scrotal elevation to help with scrotal swelling - pain control as needed - possible d/c tomorrow is leukocytosis improves and O2 requirement can be weaned, will defer to hospitalist regarding CXR and possible pna - transition to PO antibiotics pending CBC results   Jacalyn Lefevre, MD Urology 11/25/2020, 6:54 AM

## 2020-11-25 NOTE — Progress Notes (Signed)
[facility]  PROGRESS NOTE  Nathaniel Ibarra ION:[id number] DOB: 1941-04-14 DOA: 11/21/2020 PCP: [name], MD Admit date - 11/21/2020   Admitting Physician Etta Quill, DO  Outpatient Primary MD for the patient is [name], MD  LOS - 4 Brief Narrative   Nathaniel Ibarra is a 79 y.o. year old male with medical history significant for hypothyroidism, paroxysmal atrial fibrillation on Eliquis, CAD, CHF with reduced ejection fraction who presented on 11/21/2020 with dysuria and was found to have epididymitis with likely epididymal abscess now status post left scrotal exploration orchiectomy.  Hospital course complicated by CHF with reduced ejection fraction and likely pneumonia.    Subjective  Today does report a cough productive of clear sputum, no chest pain  A & P  Epididymitis with possible epididymal abscess, s/p left scrotal exploration orchiectomy by urology -Dressing and Penrose management per urology -Recommend continue scrotal elevation to help with scrotal swelling -Pain control as needed  CHF with reduced ejection fraction exacerbation.  Chest x-ray shows vascular congestion, patient endorses cough briefly required 2 L but now on room air.Likely related to IV fluids received during hospital stay in the setting of above -IV Lasix 40 mg x 1, monitor response -Continue spironolactone, losartan -Daily weights  Acute hypoxic respiratory failure.  Transient.  No actual documented hypoxia but did require O2 unclear due to above or possible pneumonia given mention of possible opacity on chest x-ray.  Patient already on cefepime for scrotal abscess -Added doxycycline for atypical pneumonia coverage -Encourage incentive spirometry -Ambulatory O2 testing on tomorrow, currently on room air -Sputum culture  Atrial fibrillation, currently rate controlled -Continue amiodarone and Eliquis      Family Communication  : Wife updated at bedside  Code  Status : Full code  Disposition Plan  :  Patient is from home anticipated d/c date:  1-2 days. Barriers to d/c or necessity for inpatient status:  Requiring IV Lasix for presumed CHF extubation, continuing antibiotics for scrotal abscess as well as likely pneumonia Consults  : urology  Procedures  :    DVT Prophylaxis  :  SCDs   MDM: The below labs and imaging reports were reviewed and summarized above.  Medication management as above.  Lab Results  Component Value Date   PLT 371 11/25/2020    Diet :  Diet Order            Diet Heart Room service appropriate? Yes; Fluid consistency: Thin  Diet effective now                  Inpatient Medications Scheduled Meds: . amiodarone  200 mg Oral Daily  . doxycycline  100 mg Oral Q12H  . finasteride  5 mg Oral Daily  . furosemide  40 mg Intravenous Daily  . [START ON 11/26/2020] influenza vaccine adjuvanted  0.5 mL Intramuscular Tomorrow-1000  . levothyroxine  100 mcg Oral Q0600  . losartan  50 mg Oral Daily  . PARoxetine  20 mg Oral Daily  . polyethylene glycol  17 g Oral Daily  . senna-docusate  1 tablet Oral BID  . spironolactone  12.5 mg Oral Daily  . tamsulosin  0.8 mg Oral QPC supper  . traZODone  50 mg Oral QHS   Continuous Infusions: . sodium chloride 10 mL/hr at 11/25/20 1500  . ceFEPime (MAXIPIME) IV 2 g (11/25/20 2107)   PRN Meds:.sodium chloride, acetaminophen **OR** acetaminophen, morphine injection, ondansetron **OR** ondansetron (ZOFRAN) IV, traMADol  Antibiotics  :  Anti-infectives (From admission, onward)   Start     Dose/Rate Route Frequency Ordered Stop   11/26/20 1000  azithromycin (ZITHROMAX) tablet 250 mg  Status:  Discontinued       "Followed by" Linked Group Details   250 mg Oral Daily 11/25/20 1255 11/25/20 1306   11/25/20 1400  doxycycline (VIBRA-TABS) tablet 100 mg        100 mg Oral Every 12 hours 11/25/20 1306 11/30/20 0959   11/25/20 1345  azithromycin (ZITHROMAX) tablet 500 mg  Status:   Discontinued       "Followed by" Linked Group Details   500 mg Oral Daily 11/25/20 1255 11/25/20 1306   11/23/20 1400  ceFEPIme (MAXIPIME) 2 g in sodium chloride 0.9 % 100 mL IVPB        2 g 200 mL/hr over 30 Minutes Intravenous Every 8 hours 11/23/20 1116     11/22/20 1800  cefTRIAXone (ROCEPHIN) 2 g in sodium chloride 0.9 % 100 mL IVPB  Status:  Discontinued        2 g 200 mL/hr over 30 Minutes Intravenous Every 24 hours 11/21/20 2001 11/23/20 1116   11/21/20 1845  cefTRIAXone (ROCEPHIN) 2 g in sodium chloride 0.9 % 100 mL IVPB        2 g 200 mL/hr over 30 Minutes Intravenous  Once 11/21/20 1840 11/21/20 1930       Objective   Vitals:   11/25/20 1118 11/25/20 1457 11/25/20 1458 11/25/20 2056  BP: (!) 166/65   (!) 148/68  Pulse: (!) 55   (!) 55  Resp: (!) 23     Temp: 97.6 F (36.4 C)   (!) 97.5 F (36.4 C)  TempSrc: Oral   Oral  SpO2: 93% 97% 95% 93%  Weight:      Height:        SpO2: 93 % O2 Flow Rate (L/min): 2 L/min  Wt Readings from Last 3 Encounters:  11/24/20 90.7 kg  05/27/20 87.8 kg  05/18/20 89.5 kg     Intake/Output Summary (Last 24 hours) at 11/25/2020 2207 Last data filed at 11/25/2020 1655 Gross per 24 hour  Intake 1580.34 ml  Output 3850 ml  Net -2269.66 ml    Physical Exam:     Awake Alert, Oriented X 3, Normal affect No new F.N deficits,  [place].AT, Normal respiratory effort on room air, crackles at bases appreciated RRR,No Gallops,Rubs or new Murmurs,  +ve B.Sounds, Abd Soft, No tenderness, No rebound, guarding or rigidity. Dressing in place in groin   I have personally reviewed the following:   Data Reviewed:  CBC Recent Labs  Lab 11/21/20 1653 11/22/20 0601 11/23/20 0837 11/24/20 0755 11/25/20 0826  WBC 15.7* 12.7* 13.0* 14.7* 16.4*  HGB 13.0 12.2* 12.2* 12.6* 12.0*  HCT 40.6 37.6* 38.3* 38.5* 37.4*  PLT 359 340 366 441* 371  MCV 98.5 97.7 97.7 95.8 96.9  MCH 31.6 31.7 31.1 31.3 31.1  MCHC 32.0 32.4 31.9 32.7 32.1  RDW  11.9 11.9 12.0 11.8 11.8  LYMPHSABS 1.0  --  1.0 1.0 0.7  MONOABS 1.2*  --  1.3* 1.4* 0.7  EOSABS 0.1  --  0.1 0.2 0.0  BASOSABS 0.0  --  0.0 0.0 0.0    Chemistries  Recent Labs  Lab 11/21/20 1653 11/22/20 0601 11/24/20 0755 11/25/20 0826  NA 136 139 136 136  K 3.7 4.1 4.5 4.4  CL 102 104 99 100  CO2 26 24 26 26   GLUCOSE 163* 113* 114*  186*  BUN 25* 21 22 23   CREATININE 0.92 0.87 0.81 0.78  CALCIUM 8.4* 7.9* 8.2* 8.2*  MG  --   --   --  2.3  AST 21  --   --  39  ALT 20  --   --  61*  ALKPHOS 78  --   --  77  BILITOT 0.6  --   --  0.3   ------------------------------------------------------------------------------------------------------------------ No results for input(s): CHOL, HDL, LDLCALC, TRIG, CHOLHDL, LDLDIRECT in the last 72 hours.  No results found for: HGBA1C ------------------------------------------------------------------------------------------------------------------ No results for input(s): TSH, T4TOTAL, T3FREE, THYROIDAB in the last 72 hours.  Invalid input(s): FREET3 ------------------------------------------------------------------------------------------------------------------ No results for input(s): VITAMINB12, FOLATE, FERRITIN, TIBC, IRON, RETICCTPCT in the last 72 hours.  Coagulation profile No results for input(s): INR, PROTIME in the last 168 hours.  No results for input(s): DDIMER in the last 72 hours.  Cardiac Enzymes No results for input(s): CKMB, TROPONINI, MYOGLOBIN in the last 168 hours.  Invalid input(s): CK ------------------------------------------------------------------------------------------------------------------    Component Value Date/Time   BNP 208.7 (H) 05/27/2020 1302    Micro Results Recent Results (from the past 240 hour(s))  Resp Panel by RT-PCR (Flu A&B, Covid) Nasopharyngeal Swab     Status: None   Collection Time: 11/21/20  4:55 PM   Specimen: Nasopharyngeal Swab; Nasopharyngeal(NP) swabs in vial transport  medium  Result Value Ref Range Status   SARS Coronavirus 2 by RT PCR NEGATIVE NEGATIVE Final    Comment: (NOTE) SARS-CoV-2 target nucleic acids are NOT DETECTED.  The SARS-CoV-2 RNA is generally detectable in upper respiratory specimens during the acute phase of infection. The lowest concentration of SARS-CoV-2 viral copies this assay can detect is 138 copies/mL. A negative result does not preclude SARS-Cov-2 infection and should not be used as the sole basis for treatment or other patient management decisions. A negative result may occur with  improper specimen collection/handling, submission of specimen other than nasopharyngeal swab, presence of viral mutation(s) within the areas targeted by this assay, and inadequate number of viral copies(<138 copies/mL). A negative result must be combined with clinical observations, patient history, and epidemiological information. The expected result is Negative.  Fact Sheet for Patients:  EntrepreneurPulse.com.au  Fact Sheet for Healthcare Providers:  IncredibleEmployment.be  This test is no t yet approved or cleared by the [place] FDA and  has been authorized for detection and/or diagnosis of SARS-CoV-2 by FDA under an Emergency Use Authorization (EUA). This EUA will remain  in effect (meaning this test can be used) for the duration of the COVID-19 declaration under Section 564(b)(1) of the Act, 21 U.S.C.section 360bbb-3(b)(1), unless the authorization is terminated  or revoked sooner.       Influenza A by PCR NEGATIVE NEGATIVE Final   Influenza B by PCR NEGATIVE NEGATIVE Final    Comment: (NOTE) The Xpert Xpress SARS-CoV-2/FLU/RSV plus assay is intended as an aid in the diagnosis of influenza from Nasopharyngeal swab specimens and should not be used as a sole basis for treatment. Nasal washings and aspirates are unacceptable for Xpert Xpress SARS-CoV-2/FLU/RSV testing.  Fact Sheet for  Patients: EntrepreneurPulse.com.au  Fact Sheet for Healthcare Providers: IncredibleEmployment.be  This test is not yet approved or cleared by the [place] FDA and has been authorized for detection and/or diagnosis of SARS-CoV-2 by FDA under an Emergency Use Authorization (EUA). This EUA will remain in effect (meaning this test can be used) for the duration of the COVID-19 declaration under Section 564(b)(1) of the Act,  21 U.S.C. section 360bbb-3(b)(1), unless the authorization is terminated or revoked.  Performed at [facility], [place] [address] [place], [place] [address]   Culture, Urine     Status: Abnormal   Collection Time: 11/21/20  9:05 PM   Specimen: Urine, Clean Catch  Result Value Ref Range Status   Specimen Description   Final    URINE, CLEAN CATCH Performed at [facility], [place] [address] [place], [place] [address]    Special Requests   Final    NONE Performed at [facility], [place] [address], [place], [place] [address]    Culture 20,000 COLONIES/mL PSEUDOMONAS AERUGINOSA (A)  Final   Report Status 11/24/2020 FINAL  Final   Organism ID, Bacteria PSEUDOMONAS AERUGINOSA (A)  Final      Susceptibility   Pseudomonas aeruginosa - MIC*    CEFTAZIDIME 2 SENSITIVE Sensitive     CIPROFLOXACIN <=0.25 SENSITIVE Sensitive     GENTAMICIN <=1 SENSITIVE Sensitive     IMIPENEM <=0.25 SENSITIVE Sensitive     PIP/TAZO <=4 SENSITIVE Sensitive     CEFEPIME 2 SENSITIVE Sensitive     * 20,000 COLONIES/mL PSEUDOMONAS AERUGINOSA  Aerobic/Anaerobic Culture (surgical/deep wound)     Status: None (Preliminary result)   Collection Time: 11/24/20  2:16 PM   Specimen: PATH Other; Tissue  Result Value Ref Range Status   Specimen Description   Final    ABSCESS SCROTUM Performed at [facility], [place] [address], [place], [place] [address]    Special Requests   Final    NONE Performed at  [facility], [place] [address] [place], [place] [address]    Gram Stain   Final    RARE WBC PRESENT, PREDOMINANTLY MONONUCLEAR NO ORGANISMS SEEN    Culture   Final    NO GROWTH < 24 HOURS Performed at [facility], [place] [address] [place],  [address]    Report Status PENDING  Incomplete    Radiology Reports DG CHEST PORT 1 VIEW  Result Date: 11/25/2020 CLINICAL DATA:  Shortness of breath EXAM: PORTABLE CHEST 1 VIEW COMPARISON:  05/16/2020 FINDINGS: Cardiomegaly and vascular pedicle widening. Diffuse interstitial prominence with subtle asymmetric density in the left mid lung. No visible effusion or pneumothorax. IMPRESSION: 1. Cardiomegaly and vascular congestion. 2. Subtle asymmetric density over the left mid chest, please correlate for pneumonia symptoms. Electronically Signed   By: Monte Fantasia M.D.   On: 11/25/2020 07:27   US SCROTUM W/DOPPLER  Result Date: 11/21/2020 CLINICAL DATA:  Scrotal swelling. EXAM: SCROTAL ULTRASOUND DOPPLER ULTRASOUND OF THE TESTICLES TECHNIQUE: Complete ultrasound examination of the testicles, epididymis, and other scrotal structures was performed. Color and spectral Doppler ultrasound were also utilized to evaluate blood flow to the testicles. COMPARISON:  None. FINDINGS: Right testicle Measurements: 3.4 x 3.0 x 2.7 cm. No mass or microlithiasis visualized. Left testicle Measurements: 3.6 x 2.4 x 2.9 cm. No definite testicular masses. There is a heterogeneous partly cystic mass that is contiguous with the testicle, but does not clearly arise from the testicle. This may arise from the epididymis. No color Doppler blood flow is seen within this. There is doppler blood flow along the periphery. The cystic and solid appearing mass measures 4.8 x 4.4 x 4.7 cm. Right epididymis:  Normal in size and appearance. Left epididymis: Complex cystic and solid avascular mass lies in the expected location of the right epididymal head. Hydrocele:  Bilateral hydroceles, moderate left and small  right, with some septations and apparent debris on the left. Varicocele:  None visualized. Pulsed Doppler interrogation of both testes demonstrates normal low resistance arterial and venous waveforms bilaterally. Bilateral scrotal wall thickening to 3 cm. IMPRESSION: 1. Complex cystic and solid avascular mass abuts the left testicle, but does not appear to arise from the testicle. Mass measures 4.8 cm in greatest dimension. This is felt to be epididymal in origin, but is of unclear etiology. 2. No defined testicular mass, no evidence of torsion and no findings consistent with epididymitis/orchitis. 3. Left greater than right hydroceles. 4. Significant scrotal skin thickening. Electronically Signed   By: Lajean Manes M.D.   On: 11/21/2020 18:25     Time Spent in minutes  30     Desiree Hane M.D on 11/25/2020 at 10:07 PM  To page go to [email or web address] - password [email or web address] [employer]

## 2020-11-26 LAB — EXPECTORATED SPUTUM ASSESSMENT W GRAM STAIN, RFLX TO RESP C

## 2020-11-26 LAB — CBC
HCT: 38.7 % — ABNORMAL LOW (ref 39.0–52.0)
Hemoglobin: 12.2 g/dL — ABNORMAL LOW (ref 13.0–17.0)
MCH: 30.8 pg (ref 26.0–34.0)
MCHC: 31.5 g/dL (ref 30.0–36.0)
MCV: 97.7 fL (ref 80.0–100.0)
Platelets: 392 10*3/uL (ref 150–400)
RBC: 3.96 MIL/uL — ABNORMAL LOW (ref 4.22–5.81)
RDW: 11.9 % (ref 11.5–15.5)
WBC: 14.4 10*3/uL — ABNORMAL HIGH (ref 4.0–10.5)
nRBC: 0 % (ref 0.0–0.2)

## 2020-11-26 LAB — BASIC METABOLIC PANEL
Anion gap: 10 (ref 5–15)
BUN: 27 mg/dL — ABNORMAL HIGH (ref 8–23)
CO2: 27 mmol/L (ref 22–32)
Calcium: 8.2 mg/dL — ABNORMAL LOW (ref 8.9–10.3)
Chloride: 103 mmol/L (ref 98–111)
Creatinine, Ser: 0.89 mg/dL (ref 0.61–1.24)
GFR, Estimated: >60 mL/min (ref >60)
Glucose, Bld: 105 mg/dL — ABNORMAL HIGH (ref 70–99)
Potassium: 4.1 mmol/L (ref 3.5–5.1)
Sodium: 140 mmol/L (ref 135–145)

## 2020-11-26 LAB — SURGICAL PATHOLOGY

## 2020-11-26 MED ORDER — LEVOFLOXACIN 750 MG PO TABS
750.0000 mg | ORAL_TABLET | Freq: Every day | ORAL | Status: DC
  Administered 2020-11-26 – 2020-11-27 (×2): 750 mg via ORAL
  Filled 2020-11-26 (×2): qty 1

## 2020-11-26 MED ORDER — FUROSEMIDE 40 MG PO TABS: 40.0000 mg | ORAL_TABLET | Freq: Every day | ORAL | Status: DC

## 2020-11-26 MED ORDER — APIXABAN 5 MG PO TABS
5.0000 mg | ORAL_TABLET | Freq: Two times a day (BID) | ORAL | Status: DC
  Administered 2020-11-26 – 2020-11-27 (×3): 5 mg via ORAL
  Filled 2020-11-26 (×3): qty 1

## 2020-11-26 MED ORDER — FUROSEMIDE 40 MG PO TABS
40.0000 mg | ORAL_TABLET | Freq: Every day | ORAL | Status: DC
  Administered 2020-11-27: 40 mg via ORAL
  Filled 2020-11-26: qty 1

## 2020-11-26 NOTE — Progress Notes (Signed)
SATURATION QUALIFICATIONS: (This note is used to comply with regulatory documentation for home oxygen)  Patient Saturations on Room Air at Rest = 96%  Patient Saturations on Room Air while Ambulating = 96%  Patient Saturations on 0 Liters of oxygen while Ambulating = 95%  Please briefly explain why patient needs home oxygen:  Patient requires no home oxygen Nathaniel Ibarra

## 2020-11-26 NOTE — Progress Notes (Signed)
[facility]  PROGRESS NOTE  Nathaniel Ibarra GUR:[id number] DOB: [birth year] DOA: 11/21/2020 PCP: [name], MD Admit date - 11/21/2020   Admitting Physician Etta Quill, DO  Outpatient Primary MD for the patient is [name], MD  LOS - 5 Brief Narrative   Nathaniel Ibarra is a 79 y.o. year old male with medical history significant for hypothyroidism, paroxysmal atrial fibrillation on Eliquis, CAD, CHF with reduced ejection fraction who presented on 11/21/2020 with dysuria and was found to have epididymitis with likely epididymal abscess now status post left scrotal exploration orchiectomy.  Hospital course complicated by CHF with reduced ejection fraction and likely pneumonia.    Subjective  T feels breathing is much improved denies any congestion.  Still having a bit of groin pain but improved from yesterday.  No fevers or chills.  A & P  Epididymitis with possible epididymal abscess, s/p left scrotal exploration orchiectomy by urology postop day 2.  Penrose removed by urology today -Dressing  management per urology -Recommend continue scrotal elevation to help with scrotal swelling -Pain control as needed -Transition to Levaquin x10 days  CHF with reduced ejection fraction exacerbation.  Chest x-ray shows vascular congestion and productive cough on 12/8 that is improved since IV Lasix.  Now on room air and no overt crackles -We will transition to oral Lasix regimen of 40 mg daily, continue to monitor response, p-Continue spironolactone, losartan -Daily weights  Acute hypoxic respiratory failure likely secondary to combined mild stage exacerbation and CAP transient.  No actual documented hypoxia but did require O2 unclear due to above or possible pneumonia given mention of possible opacity on chest x-ray.  Most likely related to mild exacerbation of CHF as well as pneumonia.  Doing well on room air currently.  -Continue Levaquin for CAP and doxycycline for  atypical pneumonia coverage -Encourage incentive spirometry -Ambulatory O2 testing today -Sputum culture  Atrial fibrillation, currently rate controlled -Continue amiodarone and Eliquis (resumed today)      Family Communication  : Wife updated at bedside  Code Status : Full code  Disposition Plan  :  Patient is from home anticipated d/c date:  1-2 days. Barriers to d/c or necessity for inpatient status:  Anticipate discharge in 24 hours and volume status remained stable on oral Lasix regimen, and breathing status remained stable on current antibiotic regimen Consults  : urology  Procedures  :    DVT Prophylaxis  :  SCDs   MDM: The below labs and imaging reports were reviewed and summarized above.  Medication management as above.  Lab Results  Component Value Date   PLT 392 11/26/2020    Diet :  Diet Order            Diet Heart Room service appropriate? Yes; Fluid consistency: Thin  Diet effective now                  Inpatient Medications Scheduled Meds: . amiodarone  200 mg Oral Daily  . apixaban  5 mg Oral BID  . doxycycline  100 mg Oral Q12H  . finasteride  5 mg Oral Daily  . [START ON 11/27/2020] furosemide  40 mg Oral Daily  . influenza vaccine adjuvanted  0.5 mL Intramuscular Tomorrow-1000  . levofloxacin  750 mg Oral Daily  . levothyroxine  100 mcg Oral Q0600  . losartan  50 mg Oral Daily  . PARoxetine  20 mg Oral Daily  . polyethylene glycol  17 g Oral Daily  .  senna-docusate  1 tablet Oral BID  . spironolactone  12.5 mg Oral Daily  . tamsulosin  0.8 mg Oral QPC supper  . traZODone  50 mg Oral QHS   Continuous Infusions: . sodium chloride 250 mL (11/26/20 0955)   PRN Meds:.sodium chloride, acetaminophen **OR** acetaminophen, morphine injection, ondansetron **OR** ondansetron (ZOFRAN) IV, traMADol  Antibiotics  :   Anti-infectives (From admission, onward)   Start     Dose/Rate Route Frequency Ordered Stop   11/26/20 1215  levofloxacin  (LEVAQUIN) tablet 750 mg        750 mg Oral Daily 11/26/20 1118 12/06/20 0959   11/26/20 1000  azithromycin (ZITHROMAX) tablet 250 mg  Status:  Discontinued       "Followed by" Linked Group Details   250 mg Oral Daily 11/25/20 1255 11/25/20 1306   11/25/20 1400  doxycycline (VIBRA-TABS) tablet 100 mg        100 mg Oral Every 12 hours 11/25/20 1306 11/30/20 0959   11/25/20 1345  azithromycin (ZITHROMAX) tablet 500 mg  Status:  Discontinued       "Followed by" Linked Group Details   500 mg Oral Daily 11/25/20 1255 11/25/20 1306   11/23/20 1400  ceFEPIme (MAXIPIME) 2 g in sodium chloride 0.9 % 100 mL IVPB  Status:  Discontinued        2 g 200 mL/hr over 30 Minutes Intravenous Every 8 hours 11/23/20 1116 11/26/20 1118   11/22/20 1800  cefTRIAXone (ROCEPHIN) 2 g in sodium chloride 0.9 % 100 mL IVPB  Status:  Discontinued        2 g 200 mL/hr over 30 Minutes Intravenous Every 24 hours 11/21/20 2001 11/23/20 1116   11/21/20 1845  cefTRIAXone (ROCEPHIN) 2 g in sodium chloride 0.9 % 100 mL IVPB        2 g 200 mL/hr over 30 Minutes Intravenous  Once 11/21/20 1840 11/21/20 1930       Objective   Vitals:   11/25/20 2056 11/26/20 0611 11/26/20 0929 11/26/20 1118  BP: (!) 148/68 (!) 145/87 (!) 161/62 (!) 144/65  Pulse: (!) 55 (!) 50 (!) 56 (!) 52  Resp:  19  19  Temp: (!) 97.5 F (36.4 C) 98 F (36.7 C)  (!) 97.5 F (36.4 C)  TempSrc: Oral Oral    SpO2: 93% 96%  96%  Weight:      Height:        SpO2: 96 % O2 Flow Rate (L/min): 2 L/min  Wt Readings from Last 3 Encounters:  11/24/20 90.7 kg  05/27/20 87.8 kg  05/18/20 89.5 kg     Intake/Output Summary (Last 24 hours) at 11/26/2020 1455 Last data filed at 11/26/2020 1239 Gross per 24 hour  Intake 240 ml  Output 5625 ml  Net -5385 ml    Physical Exam:     Awake Alert, Oriented X 3, Normal affect No new F.N deficits,  Malcolm.AT, Normal respiratory effort on room air, no crackles appreciated No peripheral edema +ve B.Sounds,  Abd Soft, No tenderness, No rebound, guarding or rigidity. Left scrotal surgical incision healing well with stitches in place with no drainage, no induration   I have personally reviewed the following:   Data Reviewed:  CBC Recent Labs  Lab 11/21/20 1653 11/22/20 0601 11/23/20 0837 11/24/20 0755 11/25/20 0826 11/26/20 0523  WBC 15.7* 12.7* 13.0* 14.7* 16.4* 14.4*  HGB 13.0 12.2* 12.2* 12.6* 12.0* 12.2*  HCT 40.6 37.6* 38.3* 38.5* 37.4* 38.7*  PLT 359 340 366 441*  371 392  MCV 98.5 97.7 97.7 95.8 96.9 97.7  MCH 31.6 31.7 31.1 31.3 31.1 30.8  MCHC 32.0 32.4 31.9 32.7 32.1 31.5  RDW 11.9 11.9 12.0 11.8 11.8 11.9  LYMPHSABS 1.0  --  1.0 1.0 0.7  --   MONOABS 1.2*  --  1.3* 1.4* 0.7  --   EOSABS 0.1  --  0.1 0.2 0.0  --   BASOSABS 0.0  --  0.0 0.0 0.0  --     Chemistries  Recent Labs  Lab 11/21/20 1653 11/22/20 0601 11/24/20 0755 11/25/20 0826 11/26/20 0523  NA 136 139 136 136 140  K 3.7 4.1 4.5 4.4 4.1  CL 102 104 99 100 103  CO2 26 24 26 26 27   GLUCOSE 163* 113* 114* 186* 105*  BUN 25* 21 22 23  27*  CREATININE 0.92 0.87 0.81 0.78 0.89  CALCIUM 8.4* 7.9* 8.2* 8.2* 8.2*  MG  --   --   --  2.3  --   AST 21  --   --  39  --   ALT 20  --   --  61*  --   ALKPHOS 78  --   --  77  --   BILITOT 0.6  --   --  0.3  --    ------------------------------------------------------------------------------------------------------------------ No results for input(s): CHOL, HDL, LDLCALC, TRIG, CHOLHDL, LDLDIRECT in the last 72 hours.  No results found for: HGBA1C ------------------------------------------------------------------------------------------------------------------ No results for input(s): TSH, T4TOTAL, T3FREE, THYROIDAB in the last 72 hours.  Invalid input(s): FREET3 ------------------------------------------------------------------------------------------------------------------ No results for input(s): VITAMINB12, FOLATE, FERRITIN, TIBC, IRON, RETICCTPCT in the last  72 hours.  Coagulation profile No results for input(s): INR, PROTIME in the last 168 hours.  No results for input(s): DDIMER in the last 72 hours.  Cardiac Enzymes No results for input(s): CKMB, TROPONINI, MYOGLOBIN in the last 168 hours.  Invalid input(s): CK ------------------------------------------------------------------------------------------------------------------    Component Value Date/Time   BNP 208.7 (H) 05/27/2020 1302    Micro Results Recent Results (from the past 240 hour(s))  Resp Panel by RT-PCR (Flu A&B, Covid) Nasopharyngeal Swab     Status: None   Collection Time: 11/21/20  4:55 PM   Specimen: Nasopharyngeal Swab; Nasopharyngeal(NP) swabs in vial transport medium  Result Value Ref Range Status   SARS Coronavirus 2 by RT PCR NEGATIVE NEGATIVE Final    Comment: (NOTE) SARS-CoV-2 target nucleic acids are NOT DETECTED.  The SARS-CoV-2 RNA is generally detectable in upper respiratory specimens during the acute phase of infection. The lowest concentration of SARS-CoV-2 viral copies this assay can detect is 138 copies/mL. A negative result does not preclude SARS-Cov-2 infection and should not be used as the sole basis for treatment or other patient management decisions. A negative result may occur with  improper specimen collection/handling, submission of specimen other than nasopharyngeal swab, presence of viral mutation(s) within the areas targeted by this assay, and inadequate number of viral copies(<138 copies/mL). A negative result must be combined with clinical observations, patient history, and epidemiological information. The expected result is Negative.  Fact Sheet for Patients:  EntrepreneurPulse.com.au  Fact Sheet for Healthcare Providers:  IncredibleEmployment.be  This test is no t yet approved or cleared by the [place] FDA and  has been authorized for detection and/or diagnosis of SARS-CoV-2 by FDA  under an Emergency Use Authorization (EUA). This EUA will remain  in effect (meaning this test can be used) for the duration of the COVID-19 declaration under Section 564(b)(1) of  the Act, 21 U.S.C.section 360bbb-3(b)(1), unless the authorization is terminated  or revoked sooner.       Influenza A by PCR NEGATIVE NEGATIVE Final   Influenza B by PCR NEGATIVE NEGATIVE Final    Comment: (NOTE) The Xpert Xpress SARS-CoV-2/FLU/RSV plus assay is intended as an aid in the diagnosis of influenza from Nasopharyngeal swab specimens and should not be used as a sole basis for treatment. Nasal washings and aspirates are unacceptable for Xpert Xpress SARS-CoV-2/FLU/RSV testing.  Fact Sheet for Patients: EntrepreneurPulse.com.au  Fact Sheet for Healthcare Providers: IncredibleEmployment.be  This test is not yet approved or cleared by the [place] FDA and has been authorized for detection and/or diagnosis of SARS-CoV-2 by FDA under an Emergency Use Authorization (EUA). This EUA will remain in effect (meaning this test can be used) for the duration of the COVID-19 declaration under Section 564(b)(1) of the Act, 21 U.S.C. section 360bbb-3(b)(1), unless the authorization is terminated or revoked.  Performed at [facility], [place] [address], [place], [place] [address]   Culture, Urine     Status: Abnormal   Collection Time: 11/21/20  9:05 PM   Specimen: Urine, Clean Catch  Result Value Ref Range Status   Specimen Description   Final    URINE, CLEAN CATCH Performed at [facility], [place] [address], [place], [place] [address]    Special Requests   Final    NONE Performed at [facility], [place] [address] [place], [place] [address]    Culture 20,000 COLONIES/mL PSEUDOMONAS AERUGINOSA (A)  Final   Report Status 11/24/2020 FINAL  Final   Organism ID, Bacteria PSEUDOMONAS AERUGINOSA (A)  Final       Susceptibility   Pseudomonas aeruginosa - MIC*    CEFTAZIDIME 2 SENSITIVE Sensitive     CIPROFLOXACIN <=0.25 SENSITIVE Sensitive     GENTAMICIN <=1 SENSITIVE Sensitive     IMIPENEM <=0.25 SENSITIVE Sensitive     PIP/TAZO <=4 SENSITIVE Sensitive     CEFEPIME 2 SENSITIVE Sensitive     * 20,000 COLONIES/mL PSEUDOMONAS AERUGINOSA  Aerobic/Anaerobic Culture (surgical/deep wound)     Status: None (Preliminary result)   Collection Time: 11/24/20  2:16 PM   Specimen: PATH Other; Tissue  Result Value Ref Range Status   Specimen Description   Final    ABSCESS SCROTUM Performed at [facility], [place] [address] [place], [place] [address]    Special Requests   Final    NONE Performed at [facility], [place] [address], [place], [place] [address]    Gram Stain   Final    RARE WBC PRESENT, PREDOMINANTLY MONONUCLEAR NO ORGANISMS SEEN    Culture   Final    NO GROWTH 2 DAYS Performed at [facility], [place] [address], [place], [place] [address]    Report Status PENDING  Incomplete    Radiology Reports DG CHEST PORT 1 VIEW  Result Date: 11/25/2020 CLINICAL DATA:  Shortness of breath EXAM: PORTABLE CHEST 1 VIEW COMPARISON:  05/16/2020 FINDINGS: Cardiomegaly and vascular pedicle widening. Diffuse interstitial prominence with subtle asymmetric density in the left mid lung. No visible effusion or pneumothorax. IMPRESSION: 1. Cardiomegaly and vascular congestion. 2. Subtle asymmetric density over the left mid chest, please correlate for pneumonia symptoms. Electronically Signed   By: Monte Fantasia M.D.   On: 11/25/2020 07:27   US SCROTUM W/DOPPLER  Result Date: 11/21/2020 CLINICAL DATA:  Scrotal swelling. EXAM: SCROTAL ULTRASOUND DOPPLER ULTRASOUND OF THE  TESTICLES TECHNIQUE: Complete ultrasound examination of the testicles, epididymis, and other scrotal structures was performed. Color and spectral Doppler ultrasound were also utilized to evaluate blood flow to the  testicles. COMPARISON:  None. FINDINGS: Right testicle Measurements: 3.4 x 3.0 x 2.7 cm. No mass or microlithiasis visualized. Left testicle Measurements: 3.6 x 2.4 x 2.9 cm. No definite testicular masses. There is a heterogeneous partly cystic mass that is contiguous with the testicle, but does not clearly arise from the testicle. This may arise from the epididymis. No color Doppler blood flow is seen within this. There is doppler blood flow along the periphery. The cystic and solid appearing mass measures 4.8 x 4.4 x 4.7 cm. Right epididymis:  Normal in size and appearance. Left epididymis: Complex cystic and solid avascular mass lies in the expected location of the right epididymal head. Hydrocele: Bilateral hydroceles, moderate left and small right, with some septations and apparent debris on the left. Varicocele:  None visualized. Pulsed Doppler interrogation of both testes demonstrates normal low resistance arterial and venous waveforms bilaterally. Bilateral scrotal wall thickening to 3 cm. IMPRESSION: 1. Complex cystic and solid avascular mass abuts the left testicle, but does not appear to arise from the testicle. Mass measures 4.8 cm in greatest dimension. This is felt to be epididymal in origin, but is of unclear etiology. 2. No defined testicular mass, no evidence of torsion and no findings consistent with epididymitis/orchitis. 3. Left greater than right hydroceles. 4. Significant scrotal skin thickening. Electronically Signed   By: Lajean Manes M.D.   On: 11/21/2020 18:25     Time Spent in minutes  30     Desiree Hane M.D on 11/26/2020 at 2:55 PM  To page go to [email or web address] - password [facility]

## 2020-11-26 NOTE — Progress Notes (Signed)
Urology Inpatient Progress Report  Epididymal abscess [N45.4] Testicular abscess [N45.4]  Procedure(s): SCROTUM EXPLORATION WITH LEFT ORCHIECTOMY  2 Days Post-Op   Intv/Subj: No acute events overnight.  Patient weaned of supplemental O2.   Principal Problem:   Testicular abscess Active Problems:   Coronary artery disease   AF (paroxysmal atrial fibrillation) (HCC)   Essential hypertension   Acute lower UTI   Chronic HFrEF (heart failure with reduced ejection fraction) ([facility])  Current Facility-Administered Medications  Medication Dose Route Frequency Provider Last Rate Last Admin  . 0.9 %  sodium chloride infusion   Intravenous PRN Oretha Milch D, MD 10 mL/hr at 11/25/20 1500 Rate Verify at 11/25/20 1500  . acetaminophen (TYLENOL) tablet 650 mg  650 mg Oral Q6H PRN Etta Quill, DO   650 mg at 11/24/20 7902   Or  . acetaminophen (TYLENOL) suppository 650 mg  650 mg Rectal Q6H PRN Etta Quill, DO      . amiodarone (PACERONE) tablet 200 mg  200 mg Oral Daily Lavina Hamman, MD   200 mg at 11/25/20 1007  . ceFEPIme (MAXIPIME) 2 g in sodium chloride 0.9 % 100 mL IVPB  2 g Intravenous Q8H Lavina Hamman, MD 200 mL/hr at 11/26/20 0510 2 g at 11/26/20 0510  . doxycycline (VIBRA-TABS) tablet 100 mg  100 mg Oral Q12H Oretha Milch D, MD   100 mg at 11/25/20 2106  . finasteride (PROSCAR) tablet 5 mg  5 mg Oral Daily Lavina Hamman, MD   5 mg at 11/25/20 1007  . furosemide (LASIX) injection 40 mg  40 mg Intravenous Daily Oretha Milch D, MD   40 mg at 11/25/20 1342  . influenza vaccine adjuvanted (FLUAD) injection 0.5 mL  0.5 mL Intramuscular Tomorrow-1000 Kendell Bane, RN      . levothyroxine (SYNTHROID) tablet 100 mcg  100 mcg Oral Q0600 Lavina Hamman, MD   100 mcg at 11/26/20 4097  . losartan (COZAAR) tablet 50 mg  50 mg Oral Daily Lavina Hamman, MD   50 mg at 11/25/20 1007  . morphine 2 MG/ML injection 2 mg  2 mg Intravenous Q2H PRN Etta Quill, DO   2 mg at  11/23/20 0336  . ondansetron (ZOFRAN) tablet 4 mg  4 mg Oral Q6H PRN Etta Quill, DO       Or  . ondansetron [facility]) injection 4 mg  4 mg Intravenous Q6H PRN Etta Quill, DO   4 mg at 11/23/20 0336  . PARoxetine (PAXIL) tablet 20 mg  20 mg Oral Daily Lavina Hamman, MD   20 mg at 11/25/20 1007  . polyethylene glycol (MIRALAX / GLYCOLAX) packet 17 g  17 g Oral Daily Lavina Hamman, MD   17 g at 11/25/20 1007  . senna-docusate (Senokot-S) tablet 1 tablet  1 tablet Oral BID Lavina Hamman, MD   1 tablet at 11/25/20 2105  . spironolactone (ALDACTONE) tablet 12.5 mg  12.5 mg Oral Daily Lavina Hamman, MD   12.5 mg at 11/25/20 1007  . tamsulosin (FLOMAX) capsule 0.8 mg  0.8 mg Oral QPC supper Lavina Hamman, MD   0.8 mg at 11/25/20 1703  . traMADol (ULTRAM) tablet 50 mg  50 mg Oral Q6H PRN Lavina Hamman, MD   50 mg at 11/25/20 1703  . traZODone (DESYREL) tablet 50 mg  50 mg Oral QHS Lavina Hamman, MD   50 mg at 11/25/20 2106  Objective: Vital: Vitals:   11/25/20 1457 11/25/20 1458 11/25/20 2056 11/26/20 0611  BP:   (!) 148/68 (!) 145/87  Pulse:   (!) 55 (!) 50  Resp:    19  Temp:   (!) 97.5 F (36.4 C) 98 F (36.7 C)  TempSrc:   Oral Oral  SpO2: 97% 95% 93% 96%  Weight:      Height:       I/Os: I/O last 3 completed shifts: In: 1580.3 [P.O.:840; I.V.:40.3; IV Piggyback:700] Out: 6301 [Urine:5450]  Physical Exam:  General: Patient is in no apparent distress Lungs: Normal respiratory effort, chest expands symmetrically. GI: abdomen is soft and nontender without mass. SW:FUXN scrotal incision c/d/i with stitches, Penrose drain removed, healing well; erythema improved Foley: condom cath with clear yellow urine Ext: lower extremities symmetric  Lab Results: Recent Labs    11/24/20 0755 11/25/20 0826 11/26/20 0523  WBC 14.7* 16.4* 14.4*  HGB 12.6* 12.0* 12.2*  HCT 38.5* 37.4* 38.7*   Recent Labs    11/24/20 0755 11/25/20 0826 11/26/20 0523  NA 136  136 140  K 4.5 4.4 4.1  CL 99 100 103  CO2 26 26 27   GLUCOSE 114* 186* 105*  BUN 22 23 27*  CREATININE 0.81 0.78 0.89  CALCIUM 8.2* 8.2* 8.2*   No results for input(s): LABPT, INR in the last 72 hours. No results for input(s): LABURIN in the last 72 hours. Results for orders placed or performed during the hospital encounter of 11/21/20  Resp Panel by RT-PCR (Flu A&B, Covid) Nasopharyngeal Swab     Status: None   Collection Time: 11/21/20  4:55 PM   Specimen: Nasopharyngeal Swab; Nasopharyngeal(NP) swabs in vial transport medium  Result Value Ref Range Status   SARS Coronavirus 2 by RT PCR NEGATIVE NEGATIVE Final    Comment: (NOTE) SARS-CoV-2 target nucleic acids are NOT DETECTED.  The SARS-CoV-2 RNA is generally detectable in upper respiratory specimens during the acute phase of infection. The lowest concentration of SARS-CoV-2 viral copies this assay can detect is 138 copies/mL. A negative result does not preclude SARS-Cov-2 infection and should not be used as the sole basis for treatment or other patient management decisions. A negative result may occur with  improper specimen collection/handling, submission of specimen other than nasopharyngeal swab, presence of viral mutation(s) within the areas targeted by this assay, and inadequate number of viral copies(<138 copies/mL). A negative result must be combined with clinical observations, patient history, and epidemiological information. The expected result is Negative.  Fact Sheet for Patients:  EntrepreneurPulse.com.au  Fact Sheet for Healthcare Providers:  IncredibleEmployment.be  This test is no t yet approved or cleared by the [place] FDA and  has been authorized for detection and/or diagnosis of SARS-CoV-2 by FDA under an Emergency Use Authorization (EUA). This EUA will remain  in effect (meaning this test can be used) for the duration of the COVID-19 declaration under Section  564(b)(1) of the Act, 21 U.S.C.section 360bbb-3(b)(1), unless the authorization is terminated  or revoked sooner.       Influenza A by PCR NEGATIVE NEGATIVE Final   Influenza B by PCR NEGATIVE NEGATIVE Final    Comment: (NOTE) The Xpert Xpress SARS-CoV-2/FLU/RSV plus assay is intended as an aid in the diagnosis of influenza from Nasopharyngeal swab specimens and should not be used as a sole basis for treatment. Nasal washings and aspirates are unacceptable for Xpert Xpress SARS-CoV-2/FLU/RSV testing.  Fact Sheet for Patients: EntrepreneurPulse.com.au  Fact Sheet for Healthcare Providers: IncredibleEmployment.be  This test is not yet approved or cleared by the [place] and has been authorized for detection and/or diagnosis of SARS-CoV-2 by FDA under an Emergency Use Authorization (EUA). This EUA will remain in effect (meaning this test can be used) for the duration of the COVID-19 declaration under Section 564(b)(1) of the Act, 21 U.S.C. section 360bbb-3(b)(1), unless the authorization is terminated or revoked.  Performed at [facility], [place] [address], [place], [place] [address]   Culture, Urine     Status: Abnormal   Collection Time: 11/21/20  9:05 PM   Specimen: Urine, Clean Catch  Result Value Ref Range Status   Specimen Description   Final    URINE, CLEAN CATCH Performed at [facility] [place] [address], [place], [place] [address]    Special Requests   Final    NONE Performed at [facility], [place] [address] [place], [place] [address]    Culture 20,000 COLONIES/mL PSEUDOMONAS AERUGINOSA (A)  Final   Report Status 11/24/2020 FINAL  Final   Organism ID, Bacteria PSEUDOMONAS AERUGINOSA (A)  Final      Susceptibility   Pseudomonas aeruginosa - MIC*    CEFTAZIDIME 2 SENSITIVE Sensitive     CIPROFLOXACIN <=0.25 SENSITIVE Sensitive     GENTAMICIN <=1 SENSITIVE Sensitive      IMIPENEM <=0.25 SENSITIVE Sensitive     PIP/TAZO <=4 SENSITIVE Sensitive     CEFEPIME 2 SENSITIVE Sensitive     * 20,000 COLONIES/mL PSEUDOMONAS AERUGINOSA  Aerobic/Anaerobic Culture (surgical/deep wound)     Status: None (Preliminary result)   Collection Time: 11/24/20  2:16 PM   Specimen: PATH Other; Tissue  Result Value Ref Range Status   Specimen Description   Final    ABSCESS SCROTUM Performed at [facility], [place] [address], [place], [place] [address]    Special Requests   Final    NONE Performed at [facility], [place] [address], [place], [place] [address]    Gram Stain   Final    RARE WBC PRESENT, PREDOMINANTLY MONONUCLEAR NO ORGANISMS SEEN    Culture   Final    NO GROWTH < 24 HOURS Performed at [facility], [place] [address], [place], [place] [address]    Report Status PENDING  Incomplete    Studies/Results: DG CHEST PORT 1 VIEW  Result Date: 11/25/2020 CLINICAL DATA:  Shortness of breath EXAM: PORTABLE CHEST 1 VIEW COMPARISON:  05/16/2020 FINDINGS: Cardiomegaly and vascular pedicle widening. Diffuse interstitial prominence with subtle asymmetric density in the left mid lung. No visible effusion or pneumothorax. IMPRESSION: 1. Cardiomegaly and vascular congestion. 2. Subtle asymmetric density over the left mid chest, please correlate for pneumonia symptoms. Electronically Signed   By: Monte Fantasia M.D.   On: 11/25/2020 07:27    Assessment: 79yo M with epididymitis, possible epididymal abscess now POD2 s/p left scrotal exploration and orchiectomy with pain improving.  Leukocytosis improving and he has been weaned off O2.     - dressing changed this AM with minimal drainage from penrose, penrose removed, scrotum elevated - recommend scrotal elevation to help with scrotal swelling - pain control as needed - transition to PO abx (office urine culture see below for resistance) - restart eliquis today  - anticipated d/c  tomorrow  Performed By: [facility]	[place], [place], [place], [place], [address]; Lab Dir: Vedia Pereyra, [phone]  >100,000 C/ML GRAM NEGATIVE RODS; ID AND SENSITIVITY TO FOLLOW   ROrganism  1 -   N  Notes: Performed By: [facility]	[place], [employer], [address] [place], [place], [place], [address]; Lab Dir: Vedia Pereyra, [phone]  .  Antimicrobial Susceptibility and Organism Identification Report  FINAL   Source     : URINE CULTURE  Iso/Result : 01     Serratia marcescens  Antimicrobic/Dose                        MIC   SYSTEMIC   URINE  ---------------------------------------------------------------  Amp/Sulbactam                          >16/8       R        R  Amikacin                                 <16       S        S  Ampicillin                               >16       R        R  Amox/K Clav                            >16/8       R        R  Aztreonam                                 <4       S        S  Ceftriaxone                               <1       S        S  Ceftazidime                               <1       S        S  Cefotaxime                                <2       S        S  Cefazolin                                >16       R        R  Ciprofloxacin                             <1       S        S  Cefepime                                  <  2       S        S  Cefotetan                                <16      R*       R*  Ertapenem                               <0.5       S        S  Nitrofurantoin                           >64                R  Gentamicin                                <4       S        S  Levofloxacin                              <2       S        S  Meropenem                                 <1       S        S  Pip/Tazo                                 <16       S        S  Trimeth/Sulfa                          <2/38       S        S  Tetracycline                              >8       R         R  Tobramycin                                <4       Tina Griffiths, MD Urology 11/26/2020, 7:26 AM

## 2020-11-27 LAB — BASIC METABOLIC PANEL
Anion gap: 9 (ref 5–15)
BUN: 26 mg/dL — ABNORMAL HIGH (ref 8–23)
CO2: 28 mmol/L (ref 22–32)
Calcium: 8.5 mg/dL — ABNORMAL LOW (ref 8.9–10.3)
Chloride: 100 mmol/L (ref 98–111)
Creatinine, Ser: 1 mg/dL (ref 0.61–1.24)
GFR, Estimated: >60 mL/min (ref >60)
Glucose, Bld: 155 mg/dL — ABNORMAL HIGH (ref 70–99)
Potassium: 3.8 mmol/L (ref 3.5–5.1)
Sodium: 137 mmol/L (ref 135–145)

## 2020-11-27 LAB — CBC
HCT: 39.9 % (ref 39.0–52.0)
Hemoglobin: 12.8 g/dL — ABNORMAL LOW (ref 13.0–17.0)
MCH: 31 pg (ref 26.0–34.0)
MCHC: 32.1 g/dL (ref 30.0–36.0)
MCV: 96.6 fL (ref 80.0–100.0)
Platelets: 429 10*3/uL — ABNORMAL HIGH (ref 150–400)
RBC: 4.13 MIL/uL — ABNORMAL LOW (ref 4.22–5.81)
RDW: 12 % (ref 11.5–15.5)
WBC: 10.1 10*3/uL (ref 4.0–10.5)
nRBC: 0 % (ref 0.0–0.2)

## 2020-11-27 MED ORDER — SULFAMETHOXAZOLE-TRIMETHOPRIM 800-160 MG PO TABS: 1.0000 | ORAL_TABLET | Freq: Two times a day (BID) | ORAL | 0 refills | Status: AC

## 2020-11-27 MED ORDER — SENNOSIDES-DOCUSATE SODIUM 8.6-50 MG PO TABS: 1.0000 | ORAL_TABLET | Freq: Two times a day (BID) | ORAL | 0 refills | Status: AC

## 2020-11-27 MED ORDER — FUROSEMIDE 40 MG PO TABS
40.0000 mg | ORAL_TABLET | Freq: Every day | ORAL | 2 refills | Status: DC
Start: 2020-11-28 — End: 2022-10-12

## 2020-11-27 MED ORDER — LEVOFLOXACIN 750 MG PO TABS: 750.0000 mg | ORAL_TABLET | Freq: Every day | ORAL | 0 refills | Status: DC

## 2020-11-27 MED ORDER — DOXYCYCLINE MONOHYDRATE 100 MG PO TABS: 100.0000 mg | ORAL_TABLET | Freq: Two times a day (BID) | ORAL | 0 refills | Status: DC

## 2020-11-27 MED ORDER — POLYETHYLENE GLYCOL 3350 17 G PO PACK: 17.0000 g | PACK | Freq: Every day | ORAL | 0 refills | Status: DC | PRN

## 2020-11-27 NOTE — Plan of Care (Signed)
  Problem: Education: Goal: Knowledge of General Education information will improve Description: Including pain rating scale, medication(s)/side effects and non-pharmacologic comfort measures Outcome: Completed/Met   Problem: Health Behavior/Discharge Planning: Goal: Ability to manage health-related needs will improve 11/27/2020 1357 by Annie Sable, RN Outcome: Completed/Met 11/27/2020 1020 by Annie Sable, RN Outcome: Progressing   Problem: Clinical Measurements: Goal: Ability to maintain clinical measurements within normal limits will improve Outcome: Completed/Met Goal: Will remain free from infection 11/27/2020 1357 by Annie Sable, RN Outcome: Completed/Met 11/27/2020 1020 by Annie Sable, RN Outcome: Adequate for Discharge Goal: Diagnostic test results will improve Outcome: Completed/Met Goal: Respiratory complications will improve Outcome: Completed/Met Goal: Cardiovascular complication will be avoided Outcome: Completed/Met   Problem: Activity: Goal: Risk for activity intolerance will decrease 11/27/2020 1357 by Annie Sable, RN Outcome: Completed/Met 11/27/2020 1020 by Annie Sable, RN Outcome: Adequate for Discharge   Problem: Nutrition: Goal: Adequate nutrition will be maintained Outcome: Completed/Met   Problem: Coping: Goal: Level of anxiety will decrease Outcome: Completed/Met   Problem: Elimination: Goal: Will not experience complications related to bowel motility Outcome: Completed/Met Goal: Will not experience complications related to urinary retention Outcome: Completed/Met   Problem: Pain Managment: Goal: General experience of comfort will improve 11/27/2020 1357 by Annie Sable, RN Outcome: Completed/Met 11/27/2020 1020 by Annie Sable, RN Outcome: Adequate for Discharge   Problem: Safety: Goal: Ability to remain free from injury will improve Outcome: Completed/Met   Problem: Skin Integrity: Goal: Risk for  impaired skin integrity will decrease 11/27/2020 1357 by Annie Sable, RN Outcome: Completed/Met 11/27/2020 1020 by Annie Sable, RN Outcome: Adequate for Discharge

## 2020-11-27 NOTE — Discharge Instructions (Addendum)
  You are on  Antibiotics for infection of the testes and pneumonia. You will be taking doxycycline 1 tab twice a day starting 12/11 for 3 days for pneumonia You will also be taking Bactrim 1 tab twice a day for 5 more days for the testes infection It is important that you not take any potassium while on these antibiotics for the next 5 days   Scrotal surgery postoperative instructions  Wound:  In most cases your incision will have absorbable sutures that will dissolve within the first 10-20 days. Some will fall out even earlier. Expect some redness as the sutures dissolved but this should occur only around the sutures. If there is generalized redness, especially with increasing pain or swelling, let us know. The scrotum will very likely get "black and blue" as the blood in the tissues spread. Sometimes the whole scrotum will turn colors. The black and blue is followed by a yellow and brown color. In time, all the discoloration will go away. In some cases some firm swelling in the area of the testicle may persist for up to 4-6 weeks after the surgery and is considered normal in most cases.    Diet:  You may return to your normal diet within 24 hours following your surgery. You may note some mild nausea and possibly vomiting the first 6-8 hours following surgery. This is usually due to the side effects of anesthesia, and will disappear quite soon. I would suggest clear liquids and a very light meal the first evening following your surgery.  Activity:  Your physical activity should be restricted the first 48 hours. During that time you should remain relatively inactive, moving about only when necessary. During the first 4 weeks following surgery he should avoid lifting any heavy objects (anything greater than 15 pounds), and avoid strenuous exercise. If you work, ask [place] specifically about your restrictions, both for work and home. We will write a note to your employer if needed.  You should plan  to wear a tight pair of jockey shorts or an athletic supporter for the first 4-5 days, even to sleep. This will keep the scrotum immobilized to some degree and keep the swelling down.  Ice packs should be placed on and off over the scrotum for the first 48 hours. Frozen peas or corn in a ZipLock bag can be frozen, used and re-frozen. Fifteen minutes on and 15 minutes off is a reasonable schedule. The ice is a good pain reliever and keeps the swelling down.  Hygiene:  You may shower 48 hours after your surgery. Tub bathing should be restricted until the seventh day.          Medication:  You will be sent home with some type of pain medication. In many cases you will be sent home with a narcotic pain pill (hydrococone or oxycodone). If the pain is not too bad, you may take either Tylenol (acetaminophen) or Advil (ibuprofen) which contain no narcotic agents, and might be tolerated a little better, with fewer side effects. If the pain medication you are sent home with does not control the pain, you will have to let us know. Some narcotic pain medications cannot be given or refilled by a phone call to a pharmacy.  Problems you should report to [place]:   Fever of 101.0 degrees Fahrenheit or greater.  Moderate or severe swelling under the skin incision or involving the scrotum.  Drug reaction such as hives, a rash, nausea or vomiting.

## 2020-11-27 NOTE — Progress Notes (Signed)
Urology Inpatient Progress Report    Intv/Subj: No acute events overnight. Patient is without complaint.  Principal Problem:   Testicular abscess Active Problems:   Coronary artery disease   AF (paroxysmal atrial fibrillation) (HCC)   Essential hypertension   Acute lower UTI   Chronic HFrEF (heart failure with reduced ejection fraction) ([place])  Current Facility-Administered Medications  Medication Dose Route Frequency Provider Last Rate Last Admin  . 0.9 %  sodium chloride infusion   Intravenous PRN Desiree Hane, MD   Stopped at 11/26/20 1801  . acetaminophen (TYLENOL) tablet 650 mg  650 mg Oral Q6H PRN Etta Quill, DO   650 mg at 11/24/20 6433   Or  . acetaminophen (TYLENOL) suppository 650 mg  650 mg Rectal Q6H PRN Etta Quill, DO      . amiodarone (PACERONE) tablet 200 mg  200 mg Oral Daily Lavina Hamman, MD   200 mg at 11/26/20 0930  . apixaban (ELIQUIS) tablet 5 mg  5 mg Oral BID Oretha Milch D, MD   5 mg at 11/26/20 2136  . doxycycline (VIBRA-TABS) tablet 100 mg  100 mg Oral Q12H Oretha Milch D, MD   100 mg at 11/26/20 2136  . finasteride (PROSCAR) tablet 5 mg  5 mg Oral Daily Lavina Hamman, MD   5 mg at 11/26/20 0931  . furosemide (LASIX) tablet 40 mg  40 mg Oral Daily Oretha Milch D, MD      . influenza vaccine adjuvanted (FLUAD) injection 0.5 mL  0.5 mL Intramuscular Tomorrow-1000 Kendell Bane, RN      . levofloxacin [facility]) tablet 750 mg  750 mg Oral Daily Oretha Milch D, MD   750 mg at 11/26/20 1200  . levothyroxine (SYNTHROID) tablet 100 mcg  100 mcg Oral Q0600 Lavina Hamman, MD   100 mcg at 11/27/20 0556  . losartan (COZAAR) tablet 50 mg  50 mg Oral Daily Lavina Hamman, MD   50 mg at 11/26/20 0930  . morphine 2 MG/ML injection 2 mg  2 mg Intravenous Q2H PRN Etta Quill, DO   2 mg at 11/23/20 0336  . ondansetron (ZOFRAN) tablet 4 mg  4 mg Oral Q6H PRN Etta Quill, DO   4 mg at 11/26/20 2248   Or  . ondansetron (ZOFRAN)  injection 4 mg  4 mg Intravenous Q6H PRN Etta Quill, DO   4 mg at 11/23/20 0336  . PARoxetine (PAXIL) tablet 20 mg  20 mg Oral Daily Lavina Hamman, MD   20 mg at 11/26/20 0930  . polyethylene glycol (MIRALAX / GLYCOLAX) packet 17 g  17 g Oral Daily Lavina Hamman, MD   17 g at 11/26/20 0931  . senna-docusate (Senokot-S) tablet 1 tablet  1 tablet Oral BID Lavina Hamman, MD   1 tablet at 11/26/20 2136  . spironolactone (ALDACTONE) tablet 12.5 mg  12.5 mg Oral Daily Lavina Hamman, MD   12.5 mg at 11/26/20 0930  . tamsulosin (FLOMAX) capsule 0.8 mg  0.8 mg Oral QPC supper Lavina Hamman, MD   0.8 mg at 11/26/20 1757  . traMADol (ULTRAM) tablet 50 mg  50 mg Oral Q6H PRN Lavina Hamman, MD   50 mg at 11/25/20 1703  . traZODone (DESYREL) tablet 50 mg  50 mg Oral QHS Lavina Hamman, MD   50 mg at 11/26/20 2136     Objective: Vital: Vitals:   11/26/20 1118 11/26/20 2036  11/27/20 0514 11/27/20 0522  BP: (!) 144/65 (!) 156/71  (!) 147/76  Pulse: (!) 52 (!) 56  (!) 55  Resp: 19 16  16   Temp: (!) 97.5 F (36.4 C) 99.4 F (37.4 C)  98.8 F (37.1 C)  TempSrc:  Oral  Oral  SpO2: 96% 93%  90%  Weight:   86.8 kg   Height:       I/Os: I/O last 3 completed shifts: In: 480 [P.O.:480] Out: 5705 [[id number]]  Physical Exam:  General: Patient is in no apparent distress Lungs: Normal respiratory effort, chest expands symmetrically. GI: The abdomen is soft and nontender without mass. GU:  Left scrotal incision c/d/i, no drainage/erythema Ext: lower extremities symmetric  Lab Results: Recent Labs    11/24/20 0755 11/25/20 0826 11/26/20 0523  WBC 14.7* 16.4* 14.4*  HGB 12.6* 12.0* 12.2*  HCT 38.5* 37.4* 38.7*   Recent Labs    11/24/20 0755 11/25/20 0826 11/26/20 0523  NA 136 136 140  K 4.5 4.4 4.1  CL 99 100 103  CO2 26 26 27   GLUCOSE 114* 186* 105*  BUN 22 23 27*  CREATININE 0.81 0.78 0.89  CALCIUM 8.2* 8.2* 8.2*   No results for input(s): LABPT, INR in the last 72  hours. No results for input(s): LABURIN in the last 72 hours. Results for orders placed or performed during the hospital encounter of 11/21/20  Resp Panel by RT-PCR (Flu A&B, Covid) Nasopharyngeal Swab     Status: None   Collection Time: 11/21/20  4:55 PM   Specimen: Nasopharyngeal Swab; Nasopharyngeal(NP) swabs in vial transport medium  Result Value Ref Range Status   SARS Coronavirus 2 by RT PCR NEGATIVE NEGATIVE Final    Comment: (NOTE) SARS-CoV-2 target nucleic acids are NOT DETECTED.  The SARS-CoV-2 RNA is generally detectable in upper respiratory specimens during the acute phase of infection. The lowest concentration of SARS-CoV-2 viral copies this assay can detect is 138 copies/mL. A negative result does not preclude SARS-Cov-2 infection and should not be used as the sole basis for treatment or other patient management decisions. A negative result may occur with  improper specimen collection/handling, submission of specimen other than nasopharyngeal swab, presence of viral mutation(s) within the areas targeted by this assay, and inadequate number of viral copies(<138 copies/mL). A negative result must be combined with clinical observations, patient history, and epidemiological information. The expected result is Negative.  Fact Sheet for Patients:  EntrepreneurPulse.com.au  Fact Sheet for Healthcare Providers:  IncredibleEmployment.be  This test is no t yet approved or cleared by the [place] FDA and  has been authorized for detection and/or diagnosis of SARS-CoV-2 by FDA under an Emergency Use Authorization (EUA). This EUA will remain  in effect (meaning this test can be used) for the duration of the COVID-19 declaration under Section 564(b)(1) of the Act, 21 U.S.C.section 360bbb-3(b)(1), unless the authorization is terminated  or revoked sooner.       Influenza A by PCR NEGATIVE NEGATIVE Final   Influenza B by PCR NEGATIVE  NEGATIVE Final    Comment: (NOTE) The Xpert Xpress SARS-CoV-2/FLU/RSV plus assay is intended as an aid in the diagnosis of influenza from Nasopharyngeal swab specimens and should not be used as a sole basis for treatment. Nasal washings and aspirates are unacceptable for Xpert Xpress SARS-CoV-2/FLU/RSV testing.  Fact Sheet for Patients: EntrepreneurPulse.com.au  Fact Sheet for Healthcare Providers: IncredibleEmployment.be  This test is not yet approved or cleared by the [place] FDA and has been  authorized for detection and/or diagnosis of SARS-CoV-2 by FDA under an Emergency Use Authorization (EUA). This EUA will remain in effect (meaning this test can be used) for the duration of the COVID-19 declaration under Section 564(b)(1) of the Act, 21 U.S.C. section 360bbb-3(b)(1), unless the authorization is terminated or revoked.  Performed at [facility], [place] [address], [place], [place] [address]   Culture, Urine     Status: Abnormal   Collection Time: 11/21/20  9:05 PM   Specimen: Urine, Clean Catch  Result Value Ref Range Status   Specimen Description   Final    URINE, CLEAN CATCH Performed at [facility], [place] [address] [place], [place] [address]    Special Requests   Final    NONE Performed at [facility], [place] [address], [place], [place] [address]    Culture 20,000 COLONIES/mL PSEUDOMONAS AERUGINOSA (A)  Final   Report Status 11/24/2020 FINAL  Final   Organism ID, Bacteria PSEUDOMONAS AERUGINOSA (A)  Final      Susceptibility   Pseudomonas aeruginosa - MIC*    CEFTAZIDIME 2 SENSITIVE Sensitive     CIPROFLOXACIN <=0.25 SENSITIVE Sensitive     GENTAMICIN <=1 SENSITIVE Sensitive     IMIPENEM <=0.25 SENSITIVE Sensitive     PIP/TAZO <=4 SENSITIVE Sensitive     CEFEPIME 2 SENSITIVE Sensitive     * 20,000 COLONIES/mL PSEUDOMONAS AERUGINOSA  Aerobic/Anaerobic Culture  (surgical/deep wound)     Status: None (Preliminary result)   Collection Time: 11/24/20  2:16 PM   Specimen: PATH Other; Tissue  Result Value Ref Range Status   Specimen Description   Final    ABSCESS SCROTUM Performed at [facility], [place] [address] [place], [place] [address]    Special Requests   Final    NONE Performed at [facility], [place] [address], [place], [place] [address]    Gram Stain   Final    RARE WBC PRESENT, PREDOMINANTLY MONONUCLEAR NO ORGANISMS SEEN    Culture   Final    NO GROWTH 2 DAYS NO ANAEROBES ISOLATED; CULTURE IN PROGRESS FOR 5 DAYS Performed at [place] [address] [place], [place] [address]    Report Status PENDING  Incomplete  Expectorated sputum assessment w rflx to resp cult     Status: None   Collection Time: 11/26/20  1:31 PM   Specimen: Expectorated Sputum  Result Value Ref Range Status   Specimen Description EXPECTORATED SPUTUM  Final   Special Requests NONE  Final   Sputum evaluation   Final    THIS SPECIMEN IS ACCEPTABLE FOR SPUTUM CULTURE Performed at [facility], [place] [address], [place], [place] [address]    Report Status 11/26/2020 FINAL  Final  Culture, respiratory     Status: None (Preliminary result)   Collection Time: 11/26/20  1:31 PM  Result Value Ref Range Status   Specimen Description   Final    EXPECTORATED SPUTUM Performed at [place] [address], [place], [place] [address]    Special Requests   Final    NONE Reflexed from [id number] Performed at [facility], [place] [address], [place], [place] [address]    Gram Stain   Final    RARE WBC PRESENT,BOTH PMN AND MONONUCLEAR MODERATE GRAM POSITIVE COCCI MODERATE GRAM NEGATIVE RODS RARE GRAM POSITIVE RODS RARE GRAM VARIABLE ROD Performed at [facility], [place],  [place] [address]    Culture PENDING  Incomplete   Report Status PENDING  Incomplete     Studies/Results: No results found.  Assessment: 79yo M with epididymitis, possible epididymal abscessnow POD3 s/p left scrotal exploration and orchiectomy with pain improving. Leukocytosis improving and he has been weaned off O2.    - recommend scrotal elevation to help with scrotal swelling - pain control as needed - continue levaquin for total of 10 days - anticipated d/c today    Jacalyn Lefevre, MD Urology 11/27/2020, 7:52 AM

## 2020-11-27 NOTE — Progress Notes (Signed)
Pt discharged home today per Dr Nettey. Pt's IV site D/C'd and WDL. Pt's VSS. Pt provided with home medication list, discharge instructions and prescriptions. Verbalized understanding. Pt left floor via WC in stable condition accompanied by NT. 

## 2020-11-27 NOTE — Plan of Care (Signed)
  Problem: Health Behavior/Discharge Planning: Goal: Ability to manage health-related needs will improve Outcome: Progressing   Problem: Clinical Measurements: Goal: Ability to maintain clinical measurements within normal limits will improve Outcome: Completed/Met Goal: Will remain free from infection Outcome: Adequate for Discharge Goal: Diagnostic test results will improve Outcome: Completed/Met Goal: Respiratory complications will improve Outcome: Completed/Met Goal: Cardiovascular complication will be avoided Outcome: Completed/Met   Problem: Activity: Goal: Risk for activity intolerance will decrease Outcome: Adequate for Discharge   Problem: Nutrition: Goal: Adequate nutrition will be maintained Outcome: Completed/Met   Problem: Coping: Goal: Level of anxiety will decrease Outcome: Completed/Met   Problem: Elimination: Goal: Will not experience complications related to bowel motility Outcome: Completed/Met Goal: Will not experience complications related to urinary retention Outcome: Completed/Met   Problem: Pain Managment: Goal: General experience of comfort will improve Outcome: Adequate for Discharge   Problem: Safety: Goal: Ability to remain free from injury will improve Outcome: Completed/Met   Problem: Skin Integrity: Goal: Risk for impaired skin integrity will decrease Outcome: Adequate for Discharge

## 2020-11-27 NOTE — Discharge Summary (Signed)
Nathaniel Ibarra:[id number] DOB: 06-Apr-1941 DOA: 11/21/2020  PCP: [name], MD  Admit date: 11/21/2020 Discharge date: 11/27/2020  Admitted From: home Disposition:  home  Recommendations for Outpatient Follow-up:  1. Follow up with PCP in 1-2 weeks 2. Bactrim x 5 days for epididymitis and doxycycline for pneumonia x 2 days. Instructed wife to avoid all potassium supplementation and see PCP in 1 week for BMP check 3. Condom cath with bag  4. Follow up outpatient arranged by urology  Home Health:none  Equipment/Devices: none  Discharge Condition: Stable CODE STATUS:FULL    Brief/Interim Summary: History of present illness:  Nathaniel Ibarra is a 79 y.o. year old male with medical history significant for hypothyroidism, paroxysmal atrial fibrillation on Eliquis, CAD, CHF with reduced ejection fraction who presented on 11/21/2020 with dysuria and was found to have epididymitis with likely epididymal abscess now status post left scrotal exploration orchiectomy.  Hospital course complicated by CHF with reduced ejection fraction and likely pneumonia.  Epididymitis with possible epididymal abscess, s/p left scrotal exploration orchiectomy on 11/24/20 by urology. Cultures were positive for Serratia ( according to urology office urine culture --see Dr. [name] note from 11/26/20) -Recommend continue scrotal elevation to help with scrotal swelling per urology -Pain control as needed -Transitioned to bactrim for remaining 5 days ( avoiding Levaquin to avoid prolonged QTC while on amiodarone) --Will follow up with urology as outpatient  CHF with reduced ejection fraction exacerbation.  Chest x-ray showed vascular congestion and productive cough on 12/8 that improved with IV Lasix.  Now on room air and no overt crackles -Will continue home regimen of oral Lasix regimen of 40 mg daily, -Continue spironolactone, losartan  Acute hypoxic respiratory failure likely secondary to combined mild  stage exacerbation and CAP ,now resolved.  No actual documented hypoxia but did require O2 unclear due to above or possible pneumonia given mention of possible opacity on chest x-ray.  Most likely related to mild exacerbation of CHF as well as pneumonia.  Doing well on room air currently at rest and with ambulation.  -Initially required Levaquin for CAP and doxycycline for atypical pneumonia coverage but was transitioned to bactrim and doxycycline on discharge -Encourage incentive spirometry  Atrial fibrillation, currently rate controlled -Continue amiodarone and Eliquis (   Consultations:  Urology  Procedures/Studies: Left scrotal exploration, left simple orchiectomy, 11/24/20 Subjective:  Discharge Exam: Vitals:   11/26/20 2036 11/27/20 0522  BP: (!) 156/71 (!) 147/76  Pulse: (!) 56 (!) 55  Resp: 16 16  Temp: 99.4 F (37.4 C) 98.8 F (37.1 C)  SpO2: 93% 90%   Vitals:   11/26/20 1118 11/26/20 2036 11/27/20 0514 11/27/20 0522  BP: (!) 144/65 (!) 156/71  (!) 147/76  Pulse: (!) 52 (!) 56  (!) 55  Resp: 19 16  16   Temp: (!) 97.5 F (36.4 C) 99.4 F (37.4 C)  98.8 F (37.1 C)  TempSrc:  Oral  Oral  SpO2: 96% 93%  90%  Weight:   86.8 kg   Height:        Awake Alert, Oriented X 3, Normal affect No new F.N deficits,  [place].AT, Normal respiratory effort on room air, no crackles appreciated No peripheral edema +ve B.Sounds, Abd Soft, No tenderness, No rebound, guarding or rigidity. Left scrotal surgical incision healing well with stitches in place with no drainage, no induration   Discharge Diagnoses:  Principal Problem:   Testicular abscess Active Problems:   Coronary artery disease   AF (paroxysmal atrial  fibrillation) ([place])   Essential hypertension   Acute lower UTI   Chronic HFrEF (heart failure with reduced ejection fraction) [facility])    Discharge Instructions  Discharge Instructions    Diet - low sodium heart healthy   Complete by: As directed    Discharge  wound care:   Complete by: As directed    Elevate scrotum   Increase activity slowly   Complete by: As directed    Nursing communication   Complete by: As directed    Please give patient condom catheter with leg bag to go home with     Allergies as of 11/27/2020   No Known Allergies     Medication List    STOP taking these medications   cefpodoxime 200 MG tablet Commonly known as: VANTIN   potassium chloride SA 20 MEQ tablet Commonly known as: KLOR-CON     TAKE these medications   amiodarone 200 MG tablet Commonly known as: Pacerone Take 1 tablet (200 mg total) by mouth daily.   apixaban 5 MG Tabs tablet Commonly known as: ELIQUIS Take 1 tablet (5 mg total) by mouth 2 (two) times daily.   finasteride 5 MG tablet Commonly known as: PROSCAR Take 1 tablet (5 mg total) by mouth daily.   furosemide 40 MG tablet Commonly known as: LASIX Take 1 tablet (40 mg total) by mouth daily. Start taking on: November 28, 2020   levofloxacin 750 MG tablet Commonly known as: LEVAQUIN Take 1 tablet (750 mg total) by mouth daily for 7 days. Start taking on: November 28, 2020   levothyroxine 100 MCG tablet Commonly known as: SYNTHROID Take 100 mcg by mouth daily.   losartan 50 MG tablet Commonly known as: COZAAR Take 1 tablet (50 mg total) by mouth daily.   PARoxetine 20 MG tablet Commonly known as: PAXIL Take 20 mg by mouth daily.   polyethylene glycol 17 g packet Commonly known as: MIRALAX / GLYCOLAX Take 17 g by mouth daily as needed.   senna-docusate 8.6-50 MG tablet Commonly known as: Senokot-S Take 1 tablet by mouth 2 (two) times daily for 5 days.   spironolactone 25 MG tablet Commonly known as: ALDACTONE Take 0.5 tablets (12.5 mg total) by mouth daily.   tamsulosin 0.4 MG Caps capsule Commonly known as: FLOMAX Take 0.8 mg by mouth at bedtime.   traMADol 50 MG tablet Commonly known as: ULTRAM Take 50 mg by mouth every 6 (six) hours.   traZODone 50 MG  tablet Commonly known as: DESYREL Take 50 mg by mouth at bedtime.            Discharge Care Instructions  (From admission, onward)         Start     Ordered   11/27/20 0000  Discharge wound care:       Comments: Elevate scrotum   11/27/20 1237          Follow-up Information    [facility].   Why: You will be contacted for an apt in 1 week Contact information: [place] [phone]             No Known Allergies      The results of significant diagnostics from this hospitalization (including imaging, microbiology, ancillary and laboratory) are listed below for reference.     Microbiology: Recent Results (from the past 240 hour(s))  Resp Panel by RT-PCR (Flu A&B, Covid) Nasopharyngeal Swab     Status: None  Collection Time: 11/21/20  4:55 PM   Specimen: Nasopharyngeal Swab; Nasopharyngeal(NP) swabs in vial transport medium  Result Value Ref Range Status   SARS Coronavirus 2 by RT PCR NEGATIVE NEGATIVE Final    Comment: (NOTE) SARS-CoV-2 target nucleic acids are NOT DETECTED.  The SARS-CoV-2 RNA is generally detectable in upper respiratory specimens during the acute phase of infection. The lowest concentration of SARS-CoV-2 viral copies this assay can detect is 138 copies/mL. A negative result does not preclude SARS-Cov-2 infection and should not be used as the sole basis for treatment or other patient management decisions. A negative result may occur with  improper specimen collection/handling, submission of specimen other than nasopharyngeal swab, presence of viral mutation(s) within the areas targeted by this assay, and inadequate number of viral copies(<138 copies/mL). A negative result must be combined with clinical observations, patient history, and epidemiological information. The expected result is Negative.  Fact Sheet for Patients:  EntrepreneurPulse.com.au  Fact  Sheet for Healthcare Providers:  IncredibleEmployment.be  This test is no t yet approved or cleared by the [place] FDA and  has been authorized for detection and/or diagnosis of SARS-CoV-2 by FDA under an Emergency Use Authorization (EUA). This EUA will remain  in effect (meaning this test can be used) for the duration of the COVID-19 declaration under Section 564(b)(1) of the Act, 21 U.S.C.section 360bbb-3(b)(1), unless the authorization is terminated  or revoked sooner.       Influenza A by PCR NEGATIVE NEGATIVE Final   Influenza B by PCR NEGATIVE NEGATIVE Final    Comment: (NOTE) The Xpert Xpress SARS-CoV-2/FLU/RSV plus assay is intended as an aid in the diagnosis of influenza from Nasopharyngeal swab specimens and should not be used as a sole basis for treatment. Nasal washings and aspirates are unacceptable for Xpert Xpress SARS-CoV-2/FLU/RSV testing.  Fact Sheet for Patients: EntrepreneurPulse.com.au  Fact Sheet for Healthcare Providers: IncredibleEmployment.be  This test is not yet approved or cleared by the [place] FDA and has been authorized for detection and/or diagnosis of SARS-CoV-2 by FDA under an Emergency Use Authorization (EUA). This EUA will remain in effect (meaning this test can be used) for the duration of the COVID-19 declaration under Section 564(b)(1) of the Act, 21 U.S.C. section 360bbb-3(b)(1), unless the authorization is terminated or revoked.  Performed at [facility], [place] [address], [place], [place] [address]   Culture, Urine     Status: Abnormal   Collection Time: 11/21/20  9:05 PM   Specimen: Urine, Clean Catch  Result Value Ref Range Status   Specimen Description   Final    URINE, CLEAN CATCH Performed at [facility], [place] [address], [place], [place] [address]    Special Requests   Final    NONE Performed at [facility], [place] [address] [place], [place] [address]    Culture 20,000 COLONIES/mL PSEUDOMONAS AERUGINOSA (A)  Final   Report Status 11/24/2020 FINAL  Final   Organism ID, Bacteria PSEUDOMONAS AERUGINOSA (A)  Final      Susceptibility   Pseudomonas aeruginosa - MIC*    CEFTAZIDIME 2 SENSITIVE Sensitive     CIPROFLOXACIN <=0.25 SENSITIVE Sensitive     GENTAMICIN <=1 SENSITIVE Sensitive     IMIPENEM <=0.25 SENSITIVE Sensitive     PIP/TAZO <=4 SENSITIVE Sensitive     CEFEPIME 2 SENSITIVE Sensitive     * 20,000 COLONIES/mL PSEUDOMONAS AERUGINOSA  Aerobic/Anaerobic Culture (surgical/deep wound)     Status: None (Preliminary result)  Collection Time: 11/24/20  2:16 PM   Specimen: PATH Other; Tissue  Result Value Ref Range Status   Specimen Description   Final    ABSCESS SCROTUM Performed at [facility], [place] [address], [place], [place] [address]    Special Requests   Final    NONE Performed at [facility], [place] [address] [place], [place] [address]    Gram Stain   Final    RARE WBC PRESENT, PREDOMINANTLY MONONUCLEAR NO ORGANISMS SEEN    Culture   Final    NO GROWTH 2 DAYS NO ANAEROBES ISOLATED; CULTURE IN PROGRESS FOR 5 DAYS Performed at [place] [address], [place], [place] [address]    Report Status PENDING  Incomplete  Expectorated sputum assessment w rflx to resp cult     Status: None   Collection Time: 11/26/20  1:31 PM   Specimen: Expectorated Sputum  Result Value Ref Range Status   Specimen Description EXPECTORATED SPUTUM  Final   Special Requests NONE  Final   Sputum evaluation   Final    THIS SPECIMEN IS ACCEPTABLE FOR SPUTUM CULTURE Performed at [facility], [place] [address], [place], [place] [address]    Report Status 11/26/2020 FINAL  Final  Culture, respiratory     Status: None (Preliminary result)   Collection Time: 11/26/20  1:31 PM  Result Value Ref Range Status   Specimen Description   Final     EXPECTORATED SPUTUM Performed at [place] [address] [place], [place] [address]    Special Requests   Final    NONE Reflexed from [id number] Performed at [facility], [place] [address] [place], [place] [address]    Gram Stain   Final    RARE WBC PRESENT,BOTH PMN AND MONONUCLEAR MODERATE GRAM POSITIVE COCCI MODERATE GRAM NEGATIVE RODS RARE GRAM POSITIVE RODS RARE GRAM VARIABLE ROD    Culture   Final    CULTURE REINCUBATED FOR BETTER GROWTH Performed at [facility], [place] [address] [place], [place] [address]    Report Status PENDING  Incomplete     Labs: BNP (last 3 results) Recent Labs    05/16/20 1104 05/27/20 1302  BNP 359.9* 324.4*   Basic Metabolic Panel: Recent Labs  Lab 11/22/20 0601 11/24/20 0755 11/25/20 0826 11/26/20 0523 11/27/20 1058  NA 139 136 136 140 137  K 4.1 4.5 4.4 4.1 3.8  CL 104 99 100 103 100  CO2 24 26 26 27 28   GLUCOSE 113* 114* 186* 105* 155*  BUN 21 22 23  27* 26*  CREATININE 0.87 0.81 0.78 0.89 1.00  CALCIUM 7.9* 8.2* 8.2* 8.2* 8.5*  MG  --   --  2.3  --   --    Liver Function Tests: Recent Labs  Lab 11/21/20 1653 11/25/20 0826  AST 21 39  ALT 20 61*  ALKPHOS 78 77  BILITOT 0.6 0.3  PROT 6.8 5.9*  ALBUMIN 3.3* 2.6*   No results for input(s): LIPASE, AMYLASE in the last 168 hours. No results for input(s): AMMONIA in the last 168 hours. CBC: Recent Labs  Lab 11/21/20 1653 11/22/20 0601 11/23/20 0837 11/24/20 0755 11/25/20 0826 11/26/20 0523 11/27/20 1053  WBC 15.7*   < > 13.0* 14.7* 16.4* 14.4* 10.1  NEUTROABS 13.3*  --  10.4* 11.9* 14.7*  --   --   HGB 13.0   < > 12.2* 12.6* 12.0* 12.2* 12.8*  HCT 40.6   < >  38.3* 38.5* 37.4* 38.7* 39.9  MCV 98.5   < > 97.7 95.8 96.9 97.7 96.6  PLT 359   < > 366 441* 371 392 429*   < > = values in this interval not displayed.   Cardiac Enzymes: No results for input(s): CKTOTAL, CKMB, CKMBINDEX, TROPONINI in the last 168  hours. BNP: Invalid input(s): POCBNP CBG: No results for input(s): GLUCAP in the last 168 hours. D-Dimer No results for input(s): DDIMER in the last 72 hours. Hgb A1c No results for input(s): HGBA1C in the last 72 hours. Lipid Profile No results for input(s): CHOL, HDL, LDLCALC, TRIG, CHOLHDL, LDLDIRECT in the last 72 hours. Thyroid function studies No results for input(s): TSH, T4TOTAL, T3FREE, THYROIDAB in the last 72 hours.  Invalid input(s): FREET3 Anemia work up No results for input(s): VITAMINB12, FOLATE, FERRITIN, TIBC, IRON, RETICCTPCT in the last 72 hours. Urinalysis    Component Value Date/Time   COLORURINE AMBER (A) 11/21/2020 1653   APPEARANCEUR HAZY (A) 11/21/2020 1653   LABSPEC 1.016 11/21/2020 1653   PHURINE 5.0 11/21/2020 1653   GLUCOSEU NEGATIVE 11/21/2020 1653   HGBUR MODERATE (A) 11/21/2020 1653   BILIRUBINUR NEGATIVE 11/21/2020 1653   KETONESUR NEGATIVE 11/21/2020 1653   PROTEINUR NEGATIVE 11/21/2020 1653   NITRITE NEGATIVE 11/21/2020 1653   LEUKOCYTESUR LARGE (A) 11/21/2020 1653   Sepsis Labs Invalid input(s): PROCALCITONIN,  WBC,  LACTICIDVEN Microbiology Recent Results (from the past 240 hour(s))  Resp Panel by RT-PCR (Flu A&B, Covid) Nasopharyngeal Swab     Status: None   Collection Time: 11/21/20  4:55 PM   Specimen: Nasopharyngeal Swab; Nasopharyngeal(NP) swabs in vial transport medium  Result Value Ref Range Status   SARS Coronavirus 2 by RT PCR NEGATIVE NEGATIVE Final    Comment: (NOTE) SARS-CoV-2 target nucleic acids are NOT DETECTED.  The SARS-CoV-2 RNA is generally detectable in upper respiratory specimens during the acute phase of infection. The lowest concentration of SARS-CoV-2 viral copies this assay can detect is 138 copies/mL. A negative result does not preclude SARS-Cov-2 infection and should not be used as the sole basis for treatment or other patient management decisions. A negative result may occur with  improper specimen  collection/handling, submission of specimen other than nasopharyngeal swab, presence of viral mutation(s) within the areas targeted by this assay, and inadequate number of viral copies(<138 copies/mL). A negative result must be combined with clinical observations, patient history, and epidemiological information. The expected result is Negative.  Fact Sheet for Patients:  EntrepreneurPulse.com.au  Fact Sheet for Healthcare Providers:  IncredibleEmployment.be  This test is no t yet approved or cleared by the [place] FDA and  has been authorized for detection and/or diagnosis of SARS-CoV-2 by FDA under an Emergency Use Authorization (EUA). This EUA will remain  in effect (meaning this test can be used) for the duration of the COVID-19 declaration under Section 564(b)(1) of the Act, 21 U.S.C.section 360bbb-3(b)(1), unless the authorization is terminated  or revoked sooner.       Influenza A by PCR NEGATIVE NEGATIVE Final   Influenza B by PCR NEGATIVE NEGATIVE Final    Comment: (NOTE) The Xpert Xpress SARS-CoV-2/FLU/RSV plus assay is intended as an aid in the diagnosis of influenza from Nasopharyngeal swab specimens and should not be used as a sole basis for treatment. Nasal washings and aspirates are unacceptable for Xpert Xpress SARS-CoV-2/FLU/RSV testing.  Fact Sheet for Patients: EntrepreneurPulse.com.au  Fact Sheet for Healthcare Providers: IncredibleEmployment.be  This test is not yet approved or cleared by  the [employer] and has been authorized for detection and/or diagnosis of SARS-CoV-2 by FDA under an Emergency Use Authorization (EUA). This EUA will remain in effect (meaning this test can be used) for the duration of the COVID-19 declaration under Section 564(b)(1) of the Act, 21 U.S.C. section 360bbb-3(b)(1), unless the authorization is terminated or revoked.  Performed at [facility], [place] [address], [place], [place] [address]   Culture, Urine     Status: Abnormal   Collection Time: 11/21/20  9:05 PM   Specimen: Urine, Clean Catch  Result Value Ref Range Status   Specimen Description   Final    URINE, CLEAN CATCH Performed at [facility], [place] [address] [place], [place] [address]    Special Requests   Final    NONE Performed at [facility], [place] [address], [place], [place] [address]    Culture 20,000 COLONIES/mL PSEUDOMONAS AERUGINOSA (A)  Final   Report Status 11/24/2020 FINAL  Final   Organism ID, Bacteria PSEUDOMONAS AERUGINOSA (A)  Final      Susceptibility   Pseudomonas aeruginosa - MIC*    CEFTAZIDIME 2 SENSITIVE Sensitive     CIPROFLOXACIN <=0.25 SENSITIVE Sensitive     GENTAMICIN <=1 SENSITIVE Sensitive     IMIPENEM <=0.25 SENSITIVE Sensitive     PIP/TAZO <=4 SENSITIVE Sensitive     CEFEPIME 2 SENSITIVE Sensitive     * 20,000 COLONIES/mL PSEUDOMONAS AERUGINOSA  Aerobic/Anaerobic Culture (surgical/deep wound)     Status: None (Preliminary result)   Collection Time: 11/24/20  2:16 PM   Specimen: PATH Other; Tissue  Result Value Ref Range Status   Specimen Description   Final    ABSCESS SCROTUM Performed at [facility], [place] [address], [place], [place] [address]    Special Requests   Final    NONE Performed at [facility], [place] [address] [place], [place] [address]    Gram Stain   Final    RARE WBC PRESENT, PREDOMINANTLY MONONUCLEAR NO ORGANISMS SEEN    Culture   Final    NO GROWTH 2 DAYS NO ANAEROBES ISOLATED; CULTURE IN PROGRESS FOR 5 DAYS Performed at [place] [address] [place], [place] [address]    Report Status PENDING  Incomplete  Expectorated sputum assessment w rflx to resp cult     Status: None   Collection Time: 11/26/20  1:31 PM   Specimen: Expectorated Sputum  Result Value Ref Range Status   Specimen Description  EXPECTORATED SPUTUM  Final   Special Requests NONE  Final   Sputum evaluation   Final    THIS SPECIMEN IS ACCEPTABLE FOR SPUTUM CULTURE Performed at [facility], [place] [address], [place], [place] [address]    Report Status 11/26/2020 FINAL  Final  Culture, respiratory     Status: None (Preliminary result)   Collection Time: 11/26/20  1:31 PM  Result Value Ref Range Status   Specimen Description   Final    EXPECTORATED SPUTUM Performed at [place] [address] [place],  [address]    Special Requests   Final    NONE Reflexed from [id number] Performed at [facility], [place] [address], [place], [place] [address]    Gram Stain   Final    RARE WBC PRESENT,BOTH PMN AND MONONUCLEAR MODERATE GRAM POSITIVE COCCI MODERATE GRAM NEGATIVE RODS RARE GRAM POSITIVE RODS RARE GRAM VARIABLE ROD  Culture   Final    CULTURE REINCUBATED FOR BETTER GROWTH Performed at [facility], [place] [address] [place], [place] [address]    Report Status PENDING  Incomplete     Time coordinating discharge: Over 30 minutes  SIGNED:   Desiree Hane, MD  [facility] 11/27/2020, 12:37 PM Pager   If 7PM-7AM, please contact night-coverage [email or web address] Password [email or web address]

## 2020-11-27 NOTE — Care Management Important Message (Signed)
Important Message  Patient Details IM Letter given to the Patient Name: Nathaniel Ibarra MRN: [id number] Date of Birth: [birth year]   Medicare Important Message Given:  Yes     Kerin Salen 11/27/2020, 1:11 PM

## 2020-11-28 LAB — CULTURE, RESPIRATORY W GRAM STAIN: Culture: NORMAL

## 2020-11-29 LAB — AEROBIC/ANAEROBIC CULTURE W GRAM STAIN (SURGICAL/DEEP WOUND): Culture: NO GROWTH

## 2020-12-03 DIAGNOSIS — N453 Epididymo-orchitis: Secondary | ICD-10-CM | POA: Diagnosis not present

## 2020-12-10 DIAGNOSIS — R8279 Other abnormal findings on microbiological examination of urine: Secondary | ICD-10-CM | POA: Diagnosis not present

## 2020-12-10 DIAGNOSIS — N453 Epididymo-orchitis: Secondary | ICD-10-CM | POA: Diagnosis not present

## 2020-12-14 DIAGNOSIS — N453 Epididymo-orchitis: Secondary | ICD-10-CM | POA: Diagnosis not present

## 2020-12-28 ENCOUNTER — Inpatient Hospital Stay
Admission: EM | Admit: 2020-12-28 | Discharge: 2021-01-13 | DRG: 177 | Disposition: A | Discharge status: Home / Self Care | Payer: PPO | Attending: Internal Medicine | Admitting: Internal Medicine

## 2020-12-28 ENCOUNTER — Other Ambulatory Visit: Payer: Self-pay

## 2020-12-28 ENCOUNTER — Emergency Department: Payer: PPO

## 2020-12-28 ENCOUNTER — Encounter: Payer: Self-pay | Admitting: Emergency Medicine

## 2020-12-28 DIAGNOSIS — I2510 Atherosclerotic heart disease of native coronary artery without angina pectoris: Secondary | ICD-10-CM | POA: Diagnosis not present

## 2020-12-28 DIAGNOSIS — I10 Essential (primary) hypertension: Secondary | ICD-10-CM | POA: Diagnosis not present

## 2020-12-28 DIAGNOSIS — R7989 Other specified abnormal findings of blood chemistry: Secondary | ICD-10-CM | POA: Diagnosis not present

## 2020-12-28 DIAGNOSIS — I5022 Chronic systolic (congestive) heart failure: Secondary | ICD-10-CM | POA: Diagnosis not present

## 2020-12-28 DIAGNOSIS — R0902 Hypoxemia: Secondary | ICD-10-CM | POA: Diagnosis not present

## 2020-12-28 DIAGNOSIS — I5032 Chronic diastolic (congestive) heart failure: Secondary | ICD-10-CM | POA: Diagnosis not present

## 2020-12-28 DIAGNOSIS — F32A Depression, unspecified: Secondary | ICD-10-CM | POA: Diagnosis not present

## 2020-12-28 DIAGNOSIS — Z955 Presence of coronary angioplasty implant and graft: Secondary | ICD-10-CM | POA: Diagnosis not present

## 2020-12-28 DIAGNOSIS — U071 COVID-19: Principal | ICD-10-CM | POA: Diagnosis present

## 2020-12-28 DIAGNOSIS — Z79899 Other long term (current) drug therapy: Secondary | ICD-10-CM

## 2020-12-28 DIAGNOSIS — N400 Enlarged prostate without lower urinary tract symptoms: Secondary | ICD-10-CM | POA: Diagnosis not present

## 2020-12-28 DIAGNOSIS — G4733 Obstructive sleep apnea (adult) (pediatric): Secondary | ICD-10-CM | POA: Diagnosis not present

## 2020-12-28 DIAGNOSIS — I480 Paroxysmal atrial fibrillation: Secondary | ICD-10-CM | POA: Diagnosis present

## 2020-12-28 DIAGNOSIS — T380X5A Adverse effect of glucocorticoids and synthetic analogues, initial encounter: Secondary | ICD-10-CM | POA: Diagnosis not present

## 2020-12-28 DIAGNOSIS — R0602 Shortness of breath: Secondary | ICD-10-CM

## 2020-12-28 DIAGNOSIS — D649 Anemia, unspecified: Secondary | ICD-10-CM | POA: Diagnosis not present

## 2020-12-28 DIAGNOSIS — R042 Hemoptysis: Secondary | ICD-10-CM | POA: Diagnosis not present

## 2020-12-28 DIAGNOSIS — R7982 Elevated C-reactive protein (CRP): Secondary | ICD-10-CM | POA: Diagnosis present

## 2020-12-28 DIAGNOSIS — Z7901 Long term (current) use of anticoagulants: Secondary | ICD-10-CM

## 2020-12-28 DIAGNOSIS — E039 Hypothyroidism, unspecified: Secondary | ICD-10-CM | POA: Diagnosis not present

## 2020-12-28 DIAGNOSIS — J80 Acute respiratory distress syndrome: Secondary | ICD-10-CM | POA: Diagnosis present

## 2020-12-28 DIAGNOSIS — J189 Pneumonia, unspecified organism: Secondary | ICD-10-CM | POA: Diagnosis not present

## 2020-12-28 DIAGNOSIS — K219 Gastro-esophageal reflux disease without esophagitis: Secondary | ICD-10-CM | POA: Diagnosis not present

## 2020-12-28 DIAGNOSIS — R739 Hyperglycemia, unspecified: Secondary | ICD-10-CM | POA: Diagnosis not present

## 2020-12-28 DIAGNOSIS — Z8551 Personal history of malignant neoplasm of bladder: Secondary | ICD-10-CM | POA: Diagnosis not present

## 2020-12-28 DIAGNOSIS — J1282 Pneumonia due to coronavirus disease 2019: Secondary | ICD-10-CM | POA: Diagnosis not present

## 2020-12-28 DIAGNOSIS — R062 Wheezing: Secondary | ICD-10-CM | POA: Diagnosis not present

## 2020-12-28 DIAGNOSIS — E875 Hyperkalemia: Secondary | ICD-10-CM | POA: Diagnosis not present

## 2020-12-28 DIAGNOSIS — I252 Old myocardial infarction: Secondary | ICD-10-CM | POA: Diagnosis not present

## 2020-12-28 DIAGNOSIS — J90 Pleural effusion, not elsewhere classified: Secondary | ICD-10-CM | POA: Diagnosis not present

## 2020-12-28 DIAGNOSIS — I110 Hypertensive heart disease with heart failure: Secondary | ICD-10-CM | POA: Diagnosis present

## 2020-12-28 DIAGNOSIS — J1289 Other viral pneumonia: Secondary | ICD-10-CM | POA: Diagnosis not present

## 2020-12-28 DIAGNOSIS — J9601 Acute respiratory failure with hypoxia: Secondary | ICD-10-CM

## 2020-12-28 DIAGNOSIS — R059 Cough, unspecified: Secondary | ICD-10-CM | POA: Diagnosis not present

## 2020-12-28 LAB — CBC
HCT: 37.8 % — ABNORMAL LOW (ref 39.0–52.0)
Hemoglobin: 12.2 g/dL — ABNORMAL LOW (ref 13.0–17.0)
MCH: 30.3 pg (ref 26.0–34.0)
MCHC: 32.3 g/dL (ref 30.0–36.0)
MCV: 94 fL (ref 80.0–100.0)
Platelets: 301 10*3/uL (ref 150–400)
RBC: 4.02 MIL/uL — ABNORMAL LOW (ref 4.22–5.81)
RDW: 13.4 % (ref 11.5–15.5)
WBC: 12.2 10*3/uL — ABNORMAL HIGH (ref 4.0–10.5)
nRBC: 0 % (ref 0.0–0.2)

## 2020-12-28 LAB — D-DIMER, QUANTITATIVE: D-Dimer, Quant: 0.63 ug/mL-FEU — ABNORMAL HIGH (ref 0.00–0.50)

## 2020-12-28 LAB — BASIC METABOLIC PANEL
Anion gap: 12 (ref 5–15)
BUN: 26 mg/dL — ABNORMAL HIGH (ref 8–23)
CO2: 28 mmol/L (ref 22–32)
Calcium: 8.5 mg/dL — ABNORMAL LOW (ref 8.9–10.3)
Chloride: 100 mmol/L (ref 98–111)
Creatinine, Ser: 1.21 mg/dL (ref 0.61–1.24)
GFR, Estimated: >60 mL/min (ref >60)
Glucose, Bld: 117 mg/dL — ABNORMAL HIGH (ref 70–99)
Potassium: 3.5 mmol/L (ref 3.5–5.1)
Sodium: 140 mmol/L (ref 135–145)

## 2020-12-28 LAB — LACTATE DEHYDROGENASE: LDH: 206 U/L — ABNORMAL HIGH (ref 98–192)

## 2020-12-28 LAB — BRAIN NATRIURETIC PEPTIDE
B Natriuretic Peptide: 179.7 pg/mL — ABNORMAL HIGH (ref 0.0–100.0)
B Natriuretic Peptide: 182.5 pg/mL — ABNORMAL HIGH (ref 0.0–100.0)

## 2020-12-28 LAB — FERRITIN: Ferritin: 496 ng/mL — ABNORMAL HIGH (ref 24–336)

## 2020-12-28 LAB — TSH: TSH: 0.899 u[IU]/mL (ref 0.350–4.500)

## 2020-12-28 LAB — PROCALCITONIN
Procalcitonin: <0.1 ng/mL
Procalcitonin: <0.1 ng/mL

## 2020-12-28 LAB — MAGNESIUM: Magnesium: 2.2 mg/dL (ref 1.7–2.4)

## 2020-12-28 LAB — TROPONIN I (HIGH SENSITIVITY): Troponin I (High Sensitivity): 18 ng/L — ABNORMAL HIGH (ref <18)

## 2020-12-28 LAB — POC SARS CORONAVIRUS 2 AG -  ED: SARS Coronavirus 2 Ag: POSITIVE — AB

## 2020-12-28 MED ORDER — SODIUM CHLORIDE 0.9 % IV SOLN: 200.0000 mg | Freq: Once | INTRAVENOUS | Status: DC

## 2020-12-28 MED ORDER — HYDROCOD POLST-CPM POLST ER 10-8 MG/5ML PO SUER
5.0000 mL | Freq: Two times a day (BID) | ORAL | Status: DC | PRN
  Administered 2021-01-01 – 2021-01-02 (×2): 5 mL via ORAL
  Filled 2020-12-28 (×2): qty 5

## 2020-12-28 MED ORDER — VITAMIN D 25 MCG (1000 UNIT) PO TABS
1000.0000 [IU] | ORAL_TABLET | Freq: Every day | ORAL | Status: DC
  Administered 2020-12-29 – 2021-01-13 (×16): 1000 [IU] via ORAL
  Filled 2020-12-28 (×16): qty 1

## 2020-12-28 MED ORDER — ONDANSETRON HCL 4 MG PO TABS
4.0000 mg | ORAL_TABLET | Freq: Four times a day (QID) | ORAL | Status: DC | PRN
  Administered 2021-01-06 – 2021-01-10 (×3): 4 mg via ORAL
  Filled 2020-12-28 (×3): qty 1

## 2020-12-28 MED ORDER — SODIUM CHLORIDE 0.9 % IV SOLN
200.0000 mg | Freq: Once | INTRAVENOUS | Status: AC
  Administered 2020-12-28: 200 mg via INTRAVENOUS
  Filled 2020-12-28: qty 200

## 2020-12-28 MED ORDER — SODIUM CHLORIDE 0.9 % IV SOLN
100.0000 mg | Freq: Every day | INTRAVENOUS | Status: AC
  Administered 2020-12-29 – 2021-01-01 (×4): 100 mg via INTRAVENOUS
  Filled 2020-12-28: qty 100
  Filled 2020-12-28 (×2): qty 20
  Filled 2020-12-28: qty 100

## 2020-12-28 MED ORDER — DOXYCYCLINE HYCLATE 100 MG PO TABS
100.0000 mg | ORAL_TABLET | Freq: Two times a day (BID) | ORAL | Status: DC
Start: 2020-12-28 — End: 2021-01-01
  Administered 2020-12-29 – 2020-12-31 (×6): 100 mg via ORAL
  Filled 2020-12-28 (×8): qty 1

## 2020-12-28 MED ORDER — BARICITINIB 2 MG PO TABS
4.0000 mg | ORAL_TABLET | Freq: Every day | ORAL | Status: DC
  Administered 2020-12-28: 4 mg via ORAL
  Filled 2020-12-28 (×2): qty 2

## 2020-12-28 MED ORDER — MAGNESIUM HYDROXIDE 400 MG/5ML PO SUSP
30.0000 mL | Freq: Every day | ORAL | Status: DC | PRN
  Filled 2020-12-28: qty 30

## 2020-12-28 MED ORDER — POTASSIUM CHLORIDE 20 MEQ PO PACK: 40.0000 meq | PACK | Freq: Once | ORAL | Status: DC

## 2020-12-28 MED ORDER — FAMOTIDINE 20 MG PO TABS
20.0000 mg | ORAL_TABLET | Freq: Two times a day (BID) | ORAL | Status: DC
  Administered 2020-12-28 – 2021-01-13 (×32): 20 mg via ORAL
  Filled 2020-12-28 (×32): qty 1

## 2020-12-28 MED ORDER — GUAIFENESIN-DM 100-10 MG/5ML PO SYRP
10.0000 mL | ORAL_SOLUTION | ORAL | Status: DC | PRN
  Administered 2020-12-30 – 2020-12-31 (×2): 10 mL via ORAL
  Filled 2020-12-28 (×2): qty 10

## 2020-12-28 MED ORDER — TRAZODONE HCL 50 MG PO TABS
50.0000 mg | ORAL_TABLET | Freq: Every day | ORAL | Status: DC
  Administered 2020-12-28 – 2021-01-12 (×16): 50 mg via ORAL
  Filled 2020-12-28 (×16): qty 1

## 2020-12-28 MED ORDER — ZINC SULFATE 220 (50 ZN) MG PO CAPS
220.0000 mg | ORAL_CAPSULE | Freq: Every day | ORAL | Status: DC
  Administered 2020-12-29 – 2021-01-13 (×16): 220 mg via ORAL
  Filled 2020-12-28 (×16): qty 1

## 2020-12-28 MED ORDER — PAROXETINE HCL 20 MG PO TABS
20.0000 mg | ORAL_TABLET | Freq: Every day | ORAL | Status: DC
  Filled 2020-12-28: qty 1

## 2020-12-28 MED ORDER — FUROSEMIDE 40 MG PO TABS
40.0000 mg | ORAL_TABLET | Freq: Every day | ORAL | Status: DC
  Administered 2020-12-29 – 2021-01-01 (×4): 40 mg via ORAL
  Filled 2020-12-28 (×4): qty 1

## 2020-12-28 MED ORDER — METHYLPREDNISOLONE SODIUM SUCC 125 MG IJ SOLR
125.0000 mg | Freq: Once | INTRAMUSCULAR | Status: AC
  Administered 2020-12-28: 125 mg via INTRAVENOUS
  Filled 2020-12-28: qty 2

## 2020-12-28 MED ORDER — PREDNISONE 20 MG PO TABS: 50.0000 mg | ORAL_TABLET | Freq: Every day | ORAL | Status: DC

## 2020-12-28 MED ORDER — TAMSULOSIN HCL 0.4 MG PO CAPS
0.8000 mg | ORAL_CAPSULE | Freq: Every day | ORAL | Status: DC
  Administered 2020-12-29 – 2021-01-13 (×16): 0.8 mg via ORAL
  Filled 2020-12-28 (×16): qty 2

## 2020-12-28 MED ORDER — TRAZODONE HCL 50 MG PO TABS: 25.0000 mg | ORAL_TABLET | Freq: Every evening | ORAL | Status: DC | PRN

## 2020-12-28 MED ORDER — ONDANSETRON HCL 4 MG/2ML IJ SOLN
4.0000 mg | Freq: Four times a day (QID) | INTRAMUSCULAR | Status: DC | PRN
  Administered 2021-01-07: 4 mg via INTRAVENOUS
  Filled 2020-12-28: qty 2

## 2020-12-28 MED ORDER — FINASTERIDE 5 MG PO TABS
5.0000 mg | ORAL_TABLET | Freq: Every day | ORAL | Status: DC
  Administered 2020-12-29 – 2021-01-13 (×16): 5 mg via ORAL
  Filled 2020-12-28 (×17): qty 1

## 2020-12-28 MED ORDER — GUAIFENESIN ER 600 MG PO TB12
600.0000 mg | ORAL_TABLET | Freq: Two times a day (BID) | ORAL | Status: DC
  Administered 2020-12-28 – 2021-01-13 (×32): 600 mg via ORAL
  Filled 2020-12-28 (×32): qty 1

## 2020-12-28 MED ORDER — ACETAMINOPHEN 325 MG PO TABS
650.0000 mg | ORAL_TABLET | Freq: Four times a day (QID) | ORAL | Status: DC | PRN
  Administered 2020-12-31: 650 mg via ORAL
  Filled 2020-12-28: qty 2

## 2020-12-28 MED ORDER — AMIODARONE HCL 200 MG PO TABS
200.0000 mg | ORAL_TABLET | Freq: Every day | ORAL | Status: DC
  Administered 2020-12-29 – 2021-01-13 (×16): 200 mg via ORAL
  Filled 2020-12-28 (×16): qty 1

## 2020-12-28 MED ORDER — BENZONATATE 100 MG PO CAPS
100.0000 mg | ORAL_CAPSULE | Freq: Once | ORAL | Status: AC
  Administered 2020-12-28: 100 mg via ORAL
  Filled 2020-12-28: qty 1

## 2020-12-28 MED ORDER — POLYETHYLENE GLYCOL 3350 17 G PO PACK
17.0000 g | PACK | Freq: Every day | ORAL | Status: DC | PRN
  Administered 2021-01-04: 17 g via ORAL
  Filled 2020-12-28: qty 1

## 2020-12-28 MED ORDER — ENOXAPARIN SODIUM 40 MG/0.4ML ~~LOC~~ SOLN: 40.0000 mg | SUBCUTANEOUS | Status: DC

## 2020-12-28 MED ORDER — ASCORBIC ACID 500 MG PO TABS
500.0000 mg | ORAL_TABLET | Freq: Every day | ORAL | Status: DC
  Administered 2020-12-29 – 2021-01-13 (×16): 500 mg via ORAL
  Filled 2020-12-28 (×16): qty 1

## 2020-12-28 MED ORDER — SODIUM CHLORIDE 0.9 % IV SOLN: INTRAVENOUS | Status: DC

## 2020-12-28 MED ORDER — SODIUM CHLORIDE 0.9 % IV SOLN: 100.0000 mg | Freq: Every day | INTRAVENOUS | Status: DC

## 2020-12-28 MED ORDER — SPIRONOLACTONE 25 MG PO TABS
12.5000 mg | ORAL_TABLET | Freq: Every day | ORAL | Status: DC
  Administered 2020-12-29 – 2021-01-02 (×5): 12.5 mg via ORAL
  Filled 2020-12-28: qty 1
  Filled 2020-12-28 (×2): qty 0.5
  Filled 2020-12-28 (×2): qty 1
  Filled 2020-12-28 (×2): qty 0.5
  Filled 2020-12-28: qty 1
  Filled 2020-12-28: qty 0.5

## 2020-12-28 MED ORDER — METHYLPREDNISOLONE SODIUM SUCC 125 MG IJ SOLR
1.0000 mg/kg | Freq: Two times a day (BID) | INTRAMUSCULAR | Status: AC
  Administered 2020-12-29 – 2020-12-31 (×6): 88.75 mg via INTRAVENOUS
  Filled 2020-12-28 (×6): qty 2

## 2020-12-28 MED ORDER — LOSARTAN POTASSIUM 50 MG PO TABS
50.0000 mg | ORAL_TABLET | Freq: Every day | ORAL | Status: DC
  Administered 2020-12-29 – 2020-12-30 (×2): 50 mg via ORAL
  Filled 2020-12-28 (×2): qty 1

## 2020-12-28 MED ORDER — LEVOTHYROXINE SODIUM 100 MCG PO TABS
100.0000 ug | ORAL_TABLET | Freq: Every day | ORAL | Status: DC
  Administered 2020-12-29 – 2021-01-13 (×16): 100 ug via ORAL
  Filled 2020-12-28 (×16): qty 1

## 2020-12-28 MED ORDER — APIXABAN 5 MG PO TABS
5.0000 mg | ORAL_TABLET | Freq: Two times a day (BID) | ORAL | Status: DC
  Administered 2020-12-28 – 2021-01-02 (×10): 5 mg via ORAL
  Filled 2020-12-28 (×10): qty 1

## 2020-12-28 NOTE — ED Triage Notes (Signed)
First Nurse Note:  Arrives from [facility] for evaluation of cough and congestion.    AAOx3.  Skin warm and dry. NAD

## 2020-12-28 NOTE — H&P (Signed)
[place]   PATIENT NAME: Nathaniel Ibarra    MR#:  [id number]  DATE OF BIRTH:  27-Aug-1941  DATE OF ADMISSION:  12/28/2020  PRIMARY CARE PHYSICIAN: [name], MD   REQUESTING/REFERRING PHYSICIAN: Loraine Leriche, MD  CHIEF COMPLAINT:   Chief Complaint  Patient presents with  . Cough  . Covid Exposure    HISTORY OF PRESENT ILLNESS:  Nathaniel Ibarra  is a 80 y.o. Caucasian male with a known history of hypertension, coronary artery disease, status post PCI and stent, paroxysmal atrial fibrillation, CHF, BPH and hypothyroidism, who presented to the emergency room with acute onset of worsening cough with dyspnea.  His symptoms started about a week and a half ago with sore throat followed by dry cough occasionally productive of whitish and greenish sputum.  He denies any fever chills.  He admitted to significant diminished sense of taste and smell.  He has been having fatigue and tiredness.  He denied any loss of appetite.  No nausea or vomiting or diarrhea.  He has been exposed to a friend with COVID prior to his symptoms.  His grandson and his wife tested positive yesterday for COVID-19 and their symptoms started 5 days ago.  He denies any chest pain or palpitations.  No dysuria, oliguria or hematuria or flank pain.  Upon presentation to the emergency room, blood pressure was 147/45 temperature was 99.1.  Pulse oximetry was 86% on room air and 95% on 2 L of O2 by nasal cannula.  CBC showed leukocytosis of 12.2 and anemia with hemoglobin of 12.2 and hematocrit 37.8 close to previous levels a month ago.  COVID-19 PCR came back positive.  BMP showed borderline potassium of 3.5 and a creatinine of 1.21 with a BUN of 26.  Two-view chest x-ray showed multifocal interstitial and patchy airspace opacities with differential diagnosis including atypical pneumonia and pulmonary edema with interval clearing of prior left midlung opacity shown on 11/25/2020 and mild enlargement of the  cardio-pericardial silhouette.  The patient was given Tessalon Perles, 125 mg of IV Solu-Medrol and was ordered IV remdesivir.  He will be admitted to a medical monitored isolation bed for further evaluation and management PAST MEDICAL HISTORY:   Past Medical History:  Diagnosis Date  . Bladder cancer ([place])   . BPH (benign prostatic hyperplasia)   . CHF (congestive heart failure) ([place])   . GERD (gastroesophageal reflux disease)   . Hypertension   . Myocardial infarction [facility])    cath with stent in 2016  . Thyroid disease   Paroxysmal atrial fibrillation  PAST SURGICAL HISTORY:   Past Surgical History:  Procedure Laterality Date  . CARDIAC CATHETERIZATION N/A 09/01/2015   Procedure: Left Heart Cath and Coronary Angiography;  Surgeon: Dionisio David, MD;  Location: [place] [facility];  Service: Cardiovascular;  Laterality: N/A;  . CARDIAC CATHETERIZATION N/A 09/01/2015   Procedure: Coronary Stent Intervention;  Surgeon: Yolonda Kida, MD;  Location: [place] [facility];  Service: Cardiovascular;  Laterality: N/A;  . CATARACT EXTRACTION W/PHACO Left 11/23/2016   Procedure: CATARACT EXTRACTION PHACO AND INTRAOCULAR LENS PLACEMENT ([facility]);  Surgeon: Leandrew Koyanagi, MD;  Location: [place];  Service: Ophthalmology;  Laterality: Left;  . CATARACT EXTRACTION W/PHACO Right 02/01/2017   Procedure: CATARACT EXTRACTION PHACO AND INTRAOCULAR LENS PLACEMENT ([place]) Complicated  right toric lens;  Surgeon: Leandrew Koyanagi, MD;  Location: [place];  Service: Ophthalmology;  Laterality: Right;  Malyugin toric Lens  . LEFT HEART CATH  AND CORONARY ANGIOGRAPHY Right 11/14/2017   Procedure: LEFT HEART CATH AND CORONARY ANGIOGRAPHY;  Surgeon: Dionisio David, MD;  Location: [place] [facility];  Service: Cardiovascular;  Laterality: Right;  . SCROTAL EXPLORATION Left 11/24/2020   Procedure: SCROTUM EXPLORATION WITH LEFT ORCHIECTOMY;  Surgeon: Robley Fries, MD;  Location:  [facility];  Service: Urology;  Laterality: Left;    SOCIAL HISTORY:   Social History   Tobacco Use  . Smoking status: Never Smoker  . Smokeless tobacco: Never Used  Substance Use Topics  . Alcohol use: No    FAMILY HISTORY:   Family History  Problem Relation Age of Onset  . Emphysema Mother   . Emphysema Father     DRUG ALLERGIES:  No Known Allergies  REVIEW OF SYSTEMS:   ROS As per history of present illness. All pertinent systems were reviewed above. Constitutional, HEENT, cardiovascular, respiratory, GI, GU, musculoskeletal, neuro, psychiatric, endocrine, integumentary and hematologic systems were reviewed and are otherwise negative/unremarkable except for positive findings mentioned above in the HPI.   MEDICATIONS AT HOME:   Prior to Admission medications   Medication Sig Start Date End Date Taking? Authorizing Provider  amiodarone (PACERONE) 200 MG tablet Take 1 tablet (200 mg total) by mouth daily. 05/18/20   Loletha Grayer, MD  apixaban (ELIQUIS) 5 MG TABS tablet Take 1 tablet (5 mg total) by mouth 2 (two) times daily. 05/18/20   Loletha Grayer, MD  doxycycline (ADOXA) 100 MG tablet Take 1 tablet (100 mg total) by mouth 2 (two) times daily. 11/27/20   Desiree Hane, MD  finasteride (PROSCAR) 5 MG tablet Take 1 tablet (5 mg total) by mouth daily. 05/18/20   Loletha Grayer, MD  furosemide (LASIX) 40 MG tablet Take 1 tablet (40 mg total) by mouth daily. 11/28/20   Desiree Hane, MD  levothyroxine (SYNTHROID) 100 MCG tablet Take 100 mcg by mouth daily. 04/27/20   [provider]  losartan (COZAAR) 50 MG tablet Take 1 tablet (50 mg total) by mouth daily. 05/18/20   Loletha Grayer, MD  PARoxetine (PAXIL) 20 MG tablet Take 20 mg by mouth daily. 11/09/20   [provider]  polyethylene glycol (MIRALAX / GLYCOLAX) 17 g packet Take 17 g by mouth daily as needed. 11/27/20   Oretha Milch D, MD  spironolactone (ALDACTONE) 25 MG tablet Take 0.5 tablets  (12.5 mg total) by mouth daily. 05/19/20   Loletha Grayer, MD  tamsulosin (FLOMAX) 0.4 MG CAPS capsule Take 0.8 mg by mouth at bedtime.     [provider]  traMADol (ULTRAM) 50 MG tablet Take 50 mg by mouth every 6 (six) hours. 11/19/20   [provider]  traZODone (DESYREL) 50 MG tablet Take 50 mg by mouth at bedtime.  03/17/20   [provider]      VITAL SIGNS:  Blood pressure (!) 151/73, pulse 70, temperature 99.1 F (37.3 C), temperature source Oral, resp. rate 16, height 6' (1.829 m), weight 88.5 kg, SpO2 92 %.  PHYSICAL EXAMINATION:  Physical Exam  GENERAL:  80 y.o.-year-old Caucasian patient lying in the bed in mild respiratory distress with conversational dyspnea. EYES: Pupils equal, round, reactive to light and accommodation. No scleral icterus. Extraocular muscles intact.  HEENT: Head atraumatic, normocephalic. Oropharynx and nasopharynx clear.  NECK:  Supple, no jugular venous distention. No thyroid enlargement, no tenderness.  LUNGS: Diminished bibasal breath sounds with bibasal crackles. CARDIOVASCULAR: Regular rate and rhythm, S1, S2 normal. No murmurs, rubs, or gallops.  ABDOMEN:  Soft, nondistended, nontender. Bowel sounds present. No organomegaly or mass.  EXTREMITIES: No pedal edema, cyanosis, or clubbing.  NEUROLOGIC: Cranial nerves II through XII are intact. Muscle strength 5/5 in all extremities. Sensation intact. Gait not checked.  PSYCHIATRIC: The patient is alert and oriented x 3.  Normal affect and good eye contact. SKIN: No obvious rash, lesion, or ulcer.   LABORATORY PANEL:   CBC Recent Labs  Lab 12/28/20 1148  WBC 12.2*  HGB 12.2*  HCT 37.8*  PLT 301   ------------------------------------------------------------------------------------------------------------------  Chemistries  Recent Labs  Lab 12/28/20 1148  NA 140  K 3.5  CL 100  CO2 28  GLUCOSE 117*  BUN 26*  CREATININE 1.21  CALCIUM 8.5*    ------------------------------------------------------------------------------------------------------------------  Cardiac Enzymes No results for input(s): TROPONINI in the last 168 hours. ------------------------------------------------------------------------------------------------------------------  RADIOLOGY:  DG Chest 2 View  Result Date: 12/28/2020 CLINICAL DATA:  Cough, hypoxia, shortness of breath. Congestion. The patient states that family members tested positive for COVID yesterday. EXAM: CHEST - 2 VIEW COMPARISON:  11/25/2020 FINDINGS: Interval improvement of previous hazy opacity over the left mid chest. Continued indistinct airspace opacities at the right lung base and along the minor fissure. Interstitial accentuation at the lung bases. Mild interstitial accentuation in the right mid lung. Mild enlargement of the cardiopericardial silhouette . No blunting of the costophrenic angles. IMPRESSION: 1. Multifocal interstitial and patchy airspace opacities. Differential diagnostic considerations include pulmonary edema or atypical pneumonia. 2. Interval clearing of prior left mid lung opacity shown on 11/25/2020. 3. Mild enlargement of the cardiopericardial silhouette Electronically Signed   By: Van Clines M.D.   On: 12/28/2020 13:13      IMPRESSION AND PLAN:   1.  Acute hypoxemic respiratory failure secondary to COVID-19. -The patient will be admitted to a medically monitored isolation bed. -O2 protocol will be followed to keep O2 saturation above 93.   2.  Multifocal pneumonia secondary to COVID-19. -The patient will be admitted to an isolation monitored bed with droplet and contact precautions. -Given multifocal pneumonia we will empirically place the patient on IV Rocephin and Zithromax for possible bacterial superinfection only with elevated Procalcitonin. -The patient will be placed on scheduled Mucinex and as needed Tussionex. -We will avoid nebulization as much  as we can, give bronchodilator MDI if needed, and with deterioration of oxygenation try to avoid BiPAP/CPAP if possible.    -Will obtain sputum Gram stain culture and sensitivity and follow blood cultures. -O2 protocol will be followed. -We will follow CRP, ferritin, LDH and D-dimer. -Will follow manual differential for ANC/ALC ratio as well as follow troponin I and daily CBC with manual differential and CMP. - Will place the patient on IV Remdesivir and IV steroid therapy with IV Solu-Medrol with elevated inflammatory markers. -The patient will be placed on vitamin D3, vitamin C, zinc sulfate, p.o. Pepcid and aspirin. -I discussed Baricitinib and the patient agreed to proceed with it.  3.  Paroxysmal atrial fibrillation. - We will continue amiodarone and Eliquis. - We will check his EKG.  4.  Essential hypertension. - We will continue Cozaar.  5.  Hypothyroidism. - We will continue Synthroid and check TSH.  6.  BPH. - We will continue Flomax Proscar..  7.  Depression. - We will continue Paxil.  8.  DVT prophylaxis. -We will continue Eliquis.   All the records are reviewed and case discussed with ED provider. The plan of care was discussed in details with the patient (and family). I answered  all questions. The patient agreed to proceed with the above mentioned plan. Further management will depend upon hospital course.   CODE STATUS: Full code  Status is: Inpatient  Remains inpatient appropriate because:Ongoing diagnostic testing needed not appropriate for outpatient work up, Unsafe d/c plan, IV treatments appropriate due to intensity of illness or inability to take PO and Inpatient level of care appropriate due to severity of illness   Dispo: The patient is from: Home              Anticipated d/c is to: Home              Anticipated d/c date is: > 3 days              Patient currently is not medically stable to d/c.   TOTAL TIME TAKING CARE OF THIS PATIENT: 55 minutes.     Christel Mormon M.D on 12/28/2020 at 7:51 PM  [facility]   From 7 PM-7 AM, contact night-coverage [email or web address]  CC: Primary care physician; [name], MD

## 2020-12-28 NOTE — ED Triage Notes (Signed)
PT to ER with c/o cough and congestion for last week. Pt denies fever, n/v/d.  States wife and daughter tested pos for COVID yesterday.

## 2020-12-28 NOTE — ED Notes (Signed)
Pt placed on 2L via [place] and sats improved to 92 at this time.

## 2020-12-28 NOTE — Progress Notes (Signed)
Remdesivir - Pharmacy Brief Note   O:  ALT:  CXR:  SpO2: 92% on 2L   A/P:  Remdesivir 200 mg IVPB once followed by 100 mg IVPB daily x 4 days.   Dewie Ahart D 12/28/2020 7:52 PM

## 2020-12-28 NOTE — ED Notes (Signed)
Oxygen tank changed out.

## 2020-12-28 NOTE — ED Provider Notes (Signed)
[facility] Emergency Department Provider Note  ____________________________________________   Event Date/Time   First MD Initiated Contact with Patient 12/28/20 1901     (approximate)  I have reviewed the triage vital signs    HISTORY  Chief Complaint Cough and Covid Exposure    HPI ELIM PIEL is a 80 y.o. male with CHF, hypertension who comes in for cough.  Patient had a positive contact with his wife and daughter who tested positive for COVID yesterday.  Patient states that he has been feeling unwell for about 1 week.  He has had some coughing and some congestion in his chest.  He denies any overt chest pain or shortness of breath or leg swelling.  Patient is on a blood thinner.  He states that his congestion is moderate, constant, nothing makes it better, nothing makes it worse.  He has been compliant with his Lasix at home.          Past Medical History:  Diagnosis Date  . Bladder cancer ([place])   . BPH (benign prostatic hyperplasia)   . CHF (congestive heart failure) ([place])   . GERD (gastroesophageal reflux disease)   . Hypertension   . Myocardial infarction [facility])    cath with stent in 2016  . Thyroid disease     Patient Active Problem List   Diagnosis Date Noted  . Testicular abscess 11/21/2020  . Acute lower UTI 11/21/2020  . Chronic HFrEF (heart failure with reduced ejection fraction) ([place]) 11/21/2020  . AF (paroxysmal atrial fibrillation) ([place])   . Essential hypertension   . Hypothyroidism   . Acute on chronic combined systolic and diastolic CHF (congestive heart failure) ([place]) 05/16/2020  . Acute on chronic systolic CHF (congestive heart failure) ([place]) 05/16/2020  . BPH (benign prostatic hyperplasia)   . GERD (gastroesophageal reflux disease)   . Hypertensive urgency   . CHF (congestive heart failure) ([place]) 11/13/2017  . Atrial fibrillation with RVR ([place]) 11/12/2017  . Pulmonary edema cardiac cause ([place]) 11/12/2017  .  Elevated troponin 11/12/2017  . Coronary artery disease 09/02/2015  . Acute coronary syndrome ([place]) 09/01/2015  . Unstable angina ([place]) 09/01/2015  . Chest pain 08/16/2015    Past Surgical History:  Procedure Laterality Date  . CARDIAC CATHETERIZATION N/A 09/01/2015   Procedure: Left Heart Cath and Coronary Angiography;  Surgeon: Dionisio David, MD;  Location: [place] [facility];  Service: Cardiovascular;  Laterality: N/A;  . CARDIAC CATHETERIZATION N/A 09/01/2015   Procedure: Coronary Stent Intervention;  Surgeon: Yolonda Kida, MD;  Location: [place] [facility];  Service: Cardiovascular;  Laterality: N/A;  . CATARACT EXTRACTION W/PHACO Left 11/23/2016   Procedure: CATARACT EXTRACTION PHACO AND INTRAOCULAR LENS PLACEMENT (IOC);  Surgeon: Leandrew Koyanagi, MD;  Location: [place];  Service: Ophthalmology;  Laterality: Left;  . CATARACT EXTRACTION W/PHACO Right 02/01/2017   Procedure: CATARACT EXTRACTION PHACO AND INTRAOCULAR LENS PLACEMENT ([place]) Complicated  right toric lens;  Surgeon: Leandrew Koyanagi, MD;  Location: [place];  Service: Ophthalmology;  Laterality: Right;  Malyugin toric Lens  . LEFT HEART CATH AND CORONARY ANGIOGRAPHY Right 11/14/2017   Procedure: LEFT HEART CATH AND CORONARY ANGIOGRAPHY;  Surgeon: Dionisio David, MD;  Location: [place] [facility];  Service: Cardiovascular;  Laterality: Right;  . SCROTAL EXPLORATION Left 11/24/2020   Procedure: SCROTUM EXPLORATION WITH LEFT ORCHIECTOMY;  Surgeon: Robley Fries, MD;  Location: [facility];  Service: Urology;  Laterality: Left;    Prior to Admission medications  Medication Sig Start Date End Date Taking? Authorizing Provider  amiodarone (PACERONE) 200 MG tablet Take 1 tablet (200 mg total) by mouth daily. 05/18/20   Loletha Grayer, MD  apixaban (ELIQUIS) 5 MG TABS tablet Take 1 tablet (5 mg total) by mouth 2 (two) times daily. 05/18/20   Loletha Grayer, MD  doxycycline (ADOXA) 100 MG  tablet Take 1 tablet (100 mg total) by mouth 2 (two) times daily. 11/27/20   Desiree Hane, MD  finasteride (PROSCAR) 5 MG tablet Take 1 tablet (5 mg total) by mouth daily. 05/18/20   Loletha Grayer, MD  furosemide (LASIX) 40 MG tablet Take 1 tablet (40 mg total) by mouth daily. 11/28/20   Desiree Hane, MD  levothyroxine (SYNTHROID) 100 MCG tablet Take 100 mcg by mouth daily. 04/27/20   [provider]  losartan (COZAAR) 50 MG tablet Take 1 tablet (50 mg total) by mouth daily. 05/18/20   Loletha Grayer, MD  PARoxetine (PAXIL) 20 MG tablet Take 20 mg by mouth daily. 11/09/20   [provider]  polyethylene glycol (MIRALAX / GLYCOLAX) 17 g packet Take 17 g by mouth daily as needed. 11/27/20   Oretha Milch D, MD  spironolactone (ALDACTONE) 25 MG tablet Take 0.5 tablets (12.5 mg total) by mouth daily. 05/19/20   Loletha Grayer, MD  tamsulosin (FLOMAX) 0.4 MG CAPS capsule Take 0.8 mg by mouth at bedtime.     [provider]  traMADol (ULTRAM) 50 MG tablet Take 50 mg by mouth every 6 (six) hours. 11/19/20   [provider]  traZODone (DESYREL) 50 MG tablet Take 50 mg by mouth at bedtime.  03/17/20   [provider]    Allergies Patient has no known allergies.  Family History  Problem Relation Age of Onset  . Emphysema Mother   . Emphysema Father     Social History Social History   Tobacco Use  . Smoking status: Never Smoker  . Smokeless tobacco: Never Used  Substance Use Topics  . Alcohol use: No  . Drug use: No      Review of Systems Constitutional: No fever/chills Eyes: No visual changes. ENT: No sore throat. Cardiovascular: Denies chest pain. Respiratory: + Cough Gastrointestinal: No abdominal pain.  No nausea, no vomiting.  No diarrhea.  No constipation. Genitourinary: Negative for dysuria. Musculoskeletal: Negative for back pain. Skin: Negative for rash. Neurological: Negative for headaches, focal weakness or  numbness. All other ROS negative ____________________________________________   PHYSICAL EXAM:  VITAL SIGNS: ED Triage Vitals  Enc Vitals Group     BP 12/28/20 1142 (!) 147/45     Pulse Rate 12/28/20 1142 65     Resp 12/28/20 1142 18     Temp 12/28/20 1142 99.1 F (37.3 C)     Temp Source 12/28/20 1142 Oral     SpO2 12/28/20 1142 (!) 86 %     Weight 12/28/20 1143 195 lb (88.5 kg)     Height 12/28/20 1143 6' (1.829 m)     Head Circumference --      Peak Flow --      Pain Score 12/28/20 1143 0     Pain Loc --      Pain Edu? --      Excl. in [place]? --     Constitutional: Alert and oriented. Well appearing and in no acute distress. Eyes: Conjunctivae are normal. EOMI. Head: Atraumatic. Nose: No congestion/rhinnorhea. Mouth/Throat: Mucous membranes are moist.   Neck: No stridor. Trachea Midline. FROM Cardiovascular:  Normal rate, regular rhythm. Grossly normal heart sounds.  Good peripheral circulation. Respiratory: no audible stridor, mild work of breathing  Gastrointestinal: Soft and nontender. No distention. No abdominal bruits.  Musculoskeletal: No lower extremity tenderness nor edema.  No joint effusions. Neurologic:  Normal speech and language. No gross focal neurologic deficits are appreciated.  Skin:  Skin is warm, dry and intact. No rash noted. Psychiatric: Mood and affect are normal. Speech and behavior are normal. GU: Deferred   ____________________________________________   LABS (all labs ordered are listed, but only abnormal results are displayed)  Labs Reviewed  BASIC METABOLIC PANEL - Abnormal; Notable for the following components:      Result Value   Glucose, Bld 117 (*)    BUN 26 (*)    Calcium 8.5 (*)    All other components within normal limits  CBC - Abnormal; Notable for the following components:   WBC 12.2 (*)    RBC 4.02 (*)    Hemoglobin 12.2 (*)    HCT 37.8 (*)    All other components within normal limits  BRAIN NATRIURETIC PEPTIDE -  Abnormal; Notable for the following components:   B Natriuretic Peptide 179.7 (*)    All other components within normal limits  POC SARS CORONAVIRUS 2 AG -  ED - Abnormal; Notable for the following components:   SARS Coronavirus 2 Ag Positive (*)    All other components within normal limits  TROPONIN I (HIGH SENSITIVITY) - Abnormal; Notable for the following components:   Troponin I (High Sensitivity) 18 (*)    All other components within normal limits  EXPECTORATED SPUTUM ASSESSMENT W REFEX TO RESP CULTURE  PROCALCITONIN  PROCALCITONIN  C-REACTIVE PROTEIN  CBC WITH DIFFERENTIAL/PLATELET  COMPREHENSIVE METABOLIC PANEL  C-REACTIVE PROTEIN  FIBRIN DERIVATIVES D-DIMER ([facility] ONLY)  FERRITIN  TSH  BRAIN NATRIURETIC PEPTIDE  D-DIMER, QUANTITATIVE (NOT AT [facility])  FERRITIN  FIBRINOGEN  LACTATE DEHYDROGENASE  PROCALCITONIN  MAGNESIUM  TROPONIN I (HIGH SENSITIVITY)   ____________________________________________   RADIOLOGY Robert Bellow, personally viewed and evaluated these images (plain radiographs) as part of my medical decision making, as well as reviewing the written report by the radiologist.  ED MD interpretation: Bilateral opacifications concerning for COVID-19  Official radiology report(s): DG Chest 2 View  Result Date: 12/28/2020 CLINICAL DATA:  Cough, hypoxia, shortness of breath. Congestion. The patient states that family members tested positive for COVID yesterday. EXAM: CHEST - 2 VIEW COMPARISON:  11/25/2020 FINDINGS: Interval improvement of previous hazy opacity over the left mid chest. Continued indistinct airspace opacities at the right lung base and along the minor fissure. Interstitial accentuation at the lung bases. Mild interstitial accentuation in the right mid lung. Mild enlargement of the cardiopericardial silhouette . No blunting of the costophrenic angles. IMPRESSION: 1. Multifocal interstitial and patchy airspace opacities. Differential diagnostic  considerations include pulmonary edema or atypical pneumonia. 2. Interval clearing of prior left mid lung opacity shown on 11/25/2020. 3. Mild enlargement of the cardiopericardial silhouette Electronically Signed   By: Van Clines M.D.   On: 12/28/2020 13:13    ____________________________________________   PROCEDURES  Procedure(s) performed (including Critical Care):  .Critical Care Performed by: Vanessa Alpaugh, MD Authorized by: Vanessa , MD   Critical care provider statement:    Critical care time (minutes):  45   Critical care was necessary to treat or prevent imminent or life-threatening deterioration of the following conditions:  Respiratory failure   Critical care was time spent personally by  me on the following activities:  Discussions with consultants, evaluation of patient's response to treatment, examination of patient, ordering and performing treatments and interventions, ordering and review of laboratory studies, ordering and review of radiographic studies, pulse oximetry, re-evaluation of patient's condition, obtaining history from patient or surrogate and review of old charts     ____________________________________________   [place] / ASSESSMENT AND PLAN / ED COURSE  SYMERE MUISE was evaluated in Emergency Department on 12/28/2020 for the symptoms described in the history of present illness. He was evaluated in the context of the global COVID-19 pandemic, which necessitated consideration that the patient might be at risk for infection with the SARS-CoV-2 virus that causes COVID-19. Institutional protocols and algorithms that pertain to the evaluation of patients at risk for COVID-19 are in a state of rapid change based on information released by regulatory bodies including the CDC and federal and state organizations. These policies and algorithms were followed during the patient's care in the ED.     Pt presents with cough.  Patient notably  hypoxic in triage was started on 2 L.  Suspect could be secondary to COVID.  Lower suspicion for PE at this time given lower risk already on a blood thinner.  He denies any chest pain or shortness of breath.  Will get labs to evaluate for dehydration and electrolyte abnormalities.  Will continue to closely monitor patient during workup.   COVID swab consistent with COVID.  Chest x-ray shows COVID-pneumonia.  Patient given Solu-Medrol and remdesivir.  Will discuss with hospital team for admission        ____________________________________________   FINAL CLINICAL IMPRESSION(S) / ED DIAGNOSES   Final diagnoses:  Acute respiratory failure with hypoxia ([place])  Pneumonia due to COVID-19 virus      MEDICATIONS GIVEN DURING THIS VISIT:  Medications  doxycycline (VIBRA-TABS) tablet 100 mg (has no administration in time range)  amiodarone (PACERONE) tablet 200 mg (has no administration in time range)  furosemide (LASIX) tablet 40 mg (has no administration in time range)  losartan (COZAAR) tablet 50 mg (has no administration in time range)  spironolactone (ALDACTONE) tablet 12.5 mg (has no administration in time range)  PARoxetine (PAXIL) tablet 20 mg (has no administration in time range)  traZODone (DESYREL) tablet 50 mg (has no administration in time range)  levothyroxine (SYNTHROID) tablet 100 mcg (has no administration in time range)  polyethylene glycol (MIRALAX / GLYCOLAX) packet 17 g (has no administration in time range)  finasteride (PROSCAR) tablet 5 mg (has no administration in time range)  tamsulosin (FLOMAX) capsule 0.8 mg (has no administration in time range)  apixaban (ELIQUIS) tablet 5 mg (has no administration in time range)  baricitinib (OLUMIANT) tablet 4 mg (has no administration in time range)  methylPREDNISolone sodium succinate (SOLU-MEDROL) 125 mg/2 mL injection 88.75 mg (has no administration in time range)    Followed by  predniSONE (DELTASONE) tablet 50 mg (has  no administration in time range)  guaiFENesin-dextromethorphan (ROBITUSSIN DM) 100-10 MG/5ML syrup 10 mL (has no administration in time range)  chlorpheniramine-HYDROcodone (TUSSIONEX) 10-8 MG/5ML suspension 5 mL (has no administration in time range)  ascorbic acid (VITAMIN C) tablet 500 mg (has no administration in time range)  zinc sulfate capsule 220 mg (has no administration in time range)  famotidine (PEPCID) tablet 20 mg (has no administration in time range)  acetaminophen (TYLENOL) tablet 650 mg (has no administration in time range)  traZODone (DESYREL) tablet 25 mg (has no administration in time range)  magnesium hydroxide (MILK OF MAGNESIA) suspension 30 mL (has no administration in time range)  ondansetron (ZOFRAN) tablet 4 mg (has no administration in time range)    Or  ondansetron (ZOFRAN) injection 4 mg (has no administration in time range)  0.9 %  sodium chloride infusion (has no administration in time range)  guaiFENesin (MUCINEX) 12 hr tablet 600 mg (has no administration in time range)  cholecalciferol (VITAMIN D3) tablet 1,000 Units (has no administration in time range)  remdesivir 200 mg in sodium chloride 0.9% 250 mL IVPB (0 mg Intravenous Stopped 12/28/20 2106)    Followed by  remdesivir 100 mg in sodium chloride 0.9 % 100 mL IVPB (has no administration in time range)  potassium chloride (KLOR-CON) packet 40 mEq (has no administration in time range)  methylPREDNISolone sodium succinate (SOLU-MEDROL) 125 mg/2 mL injection 125 mg (125 mg Intravenous Given 12/28/20 2026)  benzonatate (TESSALON) capsule 100 mg (100 mg Oral Given 12/28/20 2027)     ED Discharge Orders    None       Note:  This document was prepared using Dragon voice recognition software and may include unintentional dictation errors.   Vanessa Logan, MD 12/28/20 2125

## 2020-12-29 DIAGNOSIS — J1282 Pneumonia due to coronavirus disease 2019: Secondary | ICD-10-CM | POA: Diagnosis not present

## 2020-12-29 DIAGNOSIS — J9601 Acute respiratory failure with hypoxia: Secondary | ICD-10-CM | POA: Diagnosis not present

## 2020-12-29 DIAGNOSIS — U071 COVID-19: Secondary | ICD-10-CM | POA: Diagnosis not present

## 2020-12-29 LAB — COMPREHENSIVE METABOLIC PANEL
ALT: 30 U/L (ref 0–44)
AST: 31 U/L (ref 15–41)
Albumin: 3.1 g/dL — ABNORMAL LOW (ref 3.5–5.0)
Alkaline Phosphatase: 71 U/L (ref 38–126)
Anion gap: 12 (ref 5–15)
BUN: 23 mg/dL (ref 8–23)
CO2: 25 mmol/L (ref 22–32)
Calcium: 8.6 mg/dL — ABNORMAL LOW (ref 8.9–10.3)
Chloride: 102 mmol/L (ref 98–111)
Creatinine, Ser: 0.93 mg/dL (ref 0.61–1.24)
GFR, Estimated: >60 mL/min (ref >60)
Glucose, Bld: 215 mg/dL — ABNORMAL HIGH (ref 70–99)
Potassium: 3.9 mmol/L (ref 3.5–5.1)
Sodium: 139 mmol/L (ref 135–145)
Total Bilirubin: 0.7 mg/dL (ref 0.3–1.2)
Total Protein: 6.6 g/dL (ref 6.5–8.1)

## 2020-12-29 LAB — CBC WITH DIFFERENTIAL/PLATELET
Abs Immature Granulocytes: 0.16 10*3/uL — ABNORMAL HIGH (ref 0.00–0.07)
Basophils Absolute: 0 10*3/uL (ref 0.0–0.1)
Basophils Relative: 0 %
Eosinophils Absolute: 0 10*3/uL (ref 0.0–0.5)
Eosinophils Relative: 0 %
HCT: 38.4 % — ABNORMAL LOW (ref 39.0–52.0)
Hemoglobin: 12.6 g/dL — ABNORMAL LOW (ref 13.0–17.0)
Immature Granulocytes: 1 %
Lymphocytes Relative: 4 %
Lymphs Abs: 0.6 10*3/uL — ABNORMAL LOW (ref 0.7–4.0)
MCH: 30.7 pg (ref 26.0–34.0)
MCHC: 32.8 g/dL (ref 30.0–36.0)
MCV: 93.7 fL (ref 80.0–100.0)
Monocytes Absolute: 0.1 10*3/uL (ref 0.1–1.0)
Monocytes Relative: 1 %
Neutro Abs: 11.9 10*3/uL — ABNORMAL HIGH (ref 1.7–7.7)
Neutrophils Relative %: 94 %
Platelets: 305 10*3/uL (ref 150–400)
RBC: 4.1 MIL/uL — ABNORMAL LOW (ref 4.22–5.81)
RDW: 13.3 % (ref 11.5–15.5)
WBC: 12.8 10*3/uL — ABNORMAL HIGH (ref 4.0–10.5)
nRBC: 0 % (ref 0.0–0.2)

## 2020-12-29 LAB — FERRITIN: Ferritin: 520 ng/mL — ABNORMAL HIGH (ref 24–336)

## 2020-12-29 LAB — C-REACTIVE PROTEIN: CRP: 24 mg/dL — ABNORMAL HIGH (ref <1.0)

## 2020-12-29 LAB — FIBRINOGEN: Fibrinogen: >750 mg/dL — ABNORMAL HIGH (ref 210–475)

## 2020-12-29 LAB — D-DIMER, QUANTITATIVE: D-Dimer, Quant: 0.45 ug/mL-FEU (ref 0.00–0.50)

## 2020-12-29 LAB — TROPONIN I (HIGH SENSITIVITY): Troponin I (High Sensitivity): 18 ng/L — ABNORMAL HIGH (ref <18)

## 2020-12-29 LAB — GLUCOSE, CAPILLARY: Glucose-Capillary: 165 mg/dL — ABNORMAL HIGH (ref 70–99)

## 2020-12-29 LAB — PROCALCITONIN: Procalcitonin: <0.1 ng/mL

## 2020-12-29 LAB — CBG MONITORING, ED: Glucose-Capillary: 143 mg/dL — ABNORMAL HIGH (ref 70–99)

## 2020-12-29 MED ORDER — INSULIN ASPART 100 UNIT/ML ~~LOC~~ SOLN
0.0000 [IU] | Freq: Every day | SUBCUTANEOUS | Status: DC
  Administered 2020-12-31: 3 [IU] via SUBCUTANEOUS
  Administered 2021-01-01 – 2021-01-04 (×2): 2 [IU] via SUBCUTANEOUS
  Administered 2021-01-05: 4 [IU] via SUBCUTANEOUS
  Administered 2021-01-06: 5 [IU] via SUBCUTANEOUS
  Administered 2021-01-07: 3 [IU] via SUBCUTANEOUS
  Administered 2021-01-08 – 2021-01-09 (×2): 2 [IU] via SUBCUTANEOUS
  Filled 2020-12-29 (×7): qty 1

## 2020-12-29 MED ORDER — INSULIN ASPART 100 UNIT/ML ~~LOC~~ SOLN
0.0000 [IU] | Freq: Three times a day (TID) | SUBCUTANEOUS | Status: DC
  Administered 2020-12-29 – 2020-12-30 (×2): 2 [IU] via SUBCUTANEOUS
  Administered 2020-12-30: 3 [IU] via SUBCUTANEOUS
  Administered 2020-12-30: 8 [IU] via SUBCUTANEOUS
  Administered 2020-12-31: 5 [IU] via SUBCUTANEOUS
  Administered 2020-12-31: 2 [IU] via SUBCUTANEOUS
  Administered 2020-12-31: 5 [IU] via SUBCUTANEOUS
  Administered 2021-01-01: 3 [IU] via SUBCUTANEOUS
  Administered 2021-01-01: 2 [IU] via SUBCUTANEOUS
  Administered 2021-01-01 – 2021-01-02 (×2): 5 [IU] via SUBCUTANEOUS
  Administered 2021-01-02: 3 [IU] via SUBCUTANEOUS
  Administered 2021-01-02 – 2021-01-03 (×2): 8 [IU] via SUBCUTANEOUS
  Administered 2021-01-03 (×2): 3 [IU] via SUBCUTANEOUS
  Administered 2021-01-04: 2 [IU] via SUBCUTANEOUS
  Administered 2021-01-04: 8 [IU] via SUBCUTANEOUS
  Administered 2021-01-04: 2 [IU] via SUBCUTANEOUS
  Administered 2021-01-05: 3 [IU] via SUBCUTANEOUS
  Administered 2021-01-05: 2 [IU] via SUBCUTANEOUS
  Administered 2021-01-05: 8 [IU] via SUBCUTANEOUS
  Administered 2021-01-06 (×2): 3 [IU] via SUBCUTANEOUS
  Administered 2021-01-06: 5 [IU] via SUBCUTANEOUS
  Administered 2021-01-07: 3 [IU] via SUBCUTANEOUS
  Administered 2021-01-07: 8 [IU] via SUBCUTANEOUS
  Administered 2021-01-07: 5 [IU] via SUBCUTANEOUS
  Administered 2021-01-08: 2 [IU] via SUBCUTANEOUS
  Administered 2021-01-08: 3 [IU] via SUBCUTANEOUS
  Administered 2021-01-08: 2 [IU] via SUBCUTANEOUS
  Administered 2021-01-09: 3 [IU] via SUBCUTANEOUS
  Administered 2021-01-09 – 2021-01-10 (×3): 2 [IU] via SUBCUTANEOUS
  Administered 2021-01-10: 3 [IU] via SUBCUTANEOUS
  Administered 2021-01-11: 2 [IU] via SUBCUTANEOUS
  Administered 2021-01-11 (×2): 3 [IU] via SUBCUTANEOUS
  Administered 2021-01-12: 5 [IU] via SUBCUTANEOUS
  Administered 2021-01-13: 3 [IU] via SUBCUTANEOUS
  Filled 2020-12-29 (×41): qty 1

## 2020-12-29 NOTE — ED Notes (Signed)
Pt's soiled brief changed

## 2020-12-29 NOTE — Progress Notes (Addendum)
PROGRESS NOTE    Nathaniel Ibarra  TGG:[id number] DOB: [birth year] DOA: 12/28/2020 PCP: [name], MD   Brief Narrative: Taken from H&P Nathaniel Ibarra  is a 80 y.o. Caucasian male with a known history of hypertension, coronary artery disease, status post PCI and stent, paroxysmal atrial fibrillation, CHF, BPH and hypothyroidism, who presented to the emergency room with acute onset of worsening cough with dyspnea.  His symptoms started about a week and a half ago with sore throat followed by dry cough occasionally productive of whitish and greenish sputum.  He denies any fever chills.  He admitted to significant diminished sense of taste and smell.  He has been having fatigue and tiredness.  He denied any loss of appetite.  No nausea or vomiting or diarrhea.  He has been exposed to a friend with COVID prior to his symptoms.  His grandson and his wife tested positive yesterday for COVID-19 and their symptoms started 5 days ago. COVID-19 PCR was positive, chest x-ray with bilateral infiltrate consistent with atypical pneumonia/pulmonary edema. He was hypoxic in high 80s requiring 2 to 4 L of oxygen. He was started on remdesivir, steroid and baricitinib. Baricitinib discontinued today due to worsening leukocytosis.  Subjective: No new complaints. Appetite improving. No N/V.Still on 4L of oxygen. Vaccinated but no booster.  Assessment & Plan:   Active Problems:   Acute hypoxemic respiratory failure due to COVID-19 [facility])  Acute hypoxic respiratory failure secondary to COVID-19 pneumonia. Patient with neutrophilic predominant leukocytosis, procalcitonin remain negative, markedly elevated CRP, D-dimer within normal limit. Clinically seems improving. -Continue with remdesivir-day 2 -Continue with steroid-day 2 -Continue with Zithromax. -We will stop baricitinib and monitor. -Continue to monitor inflammatory markers. -Continue with supplemental oxygen to keep the saturation above  90%. -Continue with supportive care and supplements -Discontinue IV fluid.  Paroxysmal atrial fibrillation.  Currently rate well controlled.  EKG ordered but has not done yet. -Continue with amiodarone and Eliquis.  Hypertension.  Blood pressure mildly elevated. -Continue with home dose of Cozaar.  Hypothyroidism. -Continue with home dose of Synthroid.  Hyperglycemia.  No prior diagnosis of diabetes, most likely steroid-induced. -Check A1c -Add SSI  BPH. -Continue home dose of Flomax and Proscar  Depression.  Paxil was listed in his chart but apparently patient stopped taking it. -Discontinue Paxil and monitor.  Objective: Vitals:   12/29/20 0630 12/29/20 0700 12/29/20 0800 12/29/20 0803  BP: (!) 173/67 (!) 143/87 (!) 164/57   Pulse: (!) 54 62 61   Resp: (!) 22 (!) 21 16   Temp:      TempSrc:      SpO2: 92%   94%  Weight:      Height:        Intake/Output Summary (Last 24 hours) at 12/29/2020 0918 Last data filed at 12/28/2020 2106 Gross per 24 hour  Intake 250 ml  Output --  Net 250 ml   Filed Weights   12/28/20 1143  Weight: 88.5 kg    Examination:  General exam: Appears calm and comfortable  Respiratory system:Bilateral scatered wheeze. Respiratory effort normal. Cardiovascular system: S1 & S2 heard, RRR. Gastrointestinal system: Soft, nontender, nondistended, bowel sounds positive. Central nervous system: Alert and oriented. No focal neurological deficits. Extremities: No edema, no cyanosis, pulses intact and symmetrical. Psychiatry: Judgement and insight appear normal. Mood & affect appropriate.    DVT prophylaxis: Eliquis Code Status: Full Family Communication: Talked with wife on phone. Disposition Plan:  Status is: Inpatient  Remains inpatient appropriate because:Inpatient level  of care appropriate due to severity of illness   Dispo: The patient is from: Home              Anticipated d/c is to: Home              Anticipated d/c date is: 2  days              Patient currently is not medically stable to d/c.   Consultants:   None  Procedures:  Antimicrobials:  Zithromax  Data Reviewed: I have personally reviewed following labs and imaging studies  CBC: Recent Labs  Lab 12/28/20 1148 12/29/20 0345  WBC 12.2* 12.8*  NEUTROABS  --  11.9*  HGB 12.2* 12.6*  HCT 37.8* 38.4*  MCV 94.0 93.7  PLT 301 [id number]   Basic Metabolic Panel: Recent Labs  Lab 12/28/20 1148 12/28/20 2047 12/29/20 0345  NA 140  --  139  K 3.5  --  3.9  CL 100  --  102  CO2 28  --  25  GLUCOSE 117*  --  215*  BUN 26*  --  23  CREATININE 1.21  --  0.93  CALCIUM 8.5*  --  8.6*  MG  --  2.2  --    GFR: Estimated Creatinine Clearance: 70.7 mL/min (by C-G formula based on SCr of 0.93 mg/dL). Liver Function Tests: Recent Labs  Lab 12/29/20 0345  AST 31  ALT 30  ALKPHOS 71  BILITOT 0.7  PROT 6.6  ALBUMIN 3.1*   No results for input(s): LIPASE, AMYLASE in the last 168 hours. No results for input(s): AMMONIA in the last 168 hours. Coagulation Profile: No results for input(s): INR, PROTIME in the last 168 hours. Cardiac Enzymes: No results for input(s): CKTOTAL, CKMB, CKMBINDEX, TROPONINI in the last 168 hours. BNP (last 3 results) No results for input(s): PROBNP in the last 8760 hours. HbA1C: No results for input(s): HGBA1C in the last 72 hours. CBG: No results for input(s): GLUCAP in the last 168 hours. Lipid Profile: No results for input(s): CHOL, HDL, LDLCALC, TRIG, CHOLHDL, LDLDIRECT in the last 72 hours. Thyroid Function Tests: Recent Labs    12/28/20 2047  TSH 0.899   Anemia Panel: Recent Labs    12/28/20 2047 12/29/20 0345  FERRITIN 496* 520*   Sepsis Labs: Recent Labs  Lab 12/28/20 1148 12/28/20 2047  PROCALCITON <0.10 <0.10    No results found for this or any previous visit (from the past 240 hour(s)).   Radiology Studies: DG Chest 2 View  Result Date: 12/28/2020 CLINICAL DATA:  Cough, hypoxia,  shortness of breath. Congestion. The patient states that family members tested positive for COVID yesterday. EXAM: CHEST - 2 VIEW COMPARISON:  11/25/2020 FINDINGS: Interval improvement of previous hazy opacity over the left mid chest. Continued indistinct airspace opacities at the right lung base and along the minor fissure. Interstitial accentuation at the lung bases. Mild interstitial accentuation in the right mid lung. Mild enlargement of the cardiopericardial silhouette . No blunting of the costophrenic angles. IMPRESSION: 1. Multifocal interstitial and patchy airspace opacities. Differential diagnostic considerations include pulmonary edema or atypical pneumonia. 2. Interval clearing of prior left mid lung opacity shown on 11/25/2020. 3. Mild enlargement of the cardiopericardial silhouette Electronically Signed   By: Van Clines M.D.   On: 12/28/2020 13:13    Scheduled Meds: . amiodarone  200 mg Oral Daily  . apixaban  5 mg Oral BID  . vitamin C  500 mg Oral Daily  .  cholecalciferol  1,000 Units Oral Daily  . doxycycline  100 mg Oral BID  . famotidine  20 mg Oral BID  . finasteride  5 mg Oral Daily  . furosemide  40 mg Oral Daily  . guaiFENesin  600 mg Oral BID  . levothyroxine  100 mcg Oral Daily  . losartan  50 mg Oral Daily  . methylPREDNISolone (SOLU-MEDROL) injection  1 mg/kg Intravenous Q12H   Followed by  . [START ON 01/01/2021] predniSONE  50 mg Oral Daily  . spironolactone  12.5 mg Oral Daily  . tamsulosin  0.8 mg Oral Daily  . traZODone  50 mg Oral QHS  . zinc sulfate  220 mg Oral Daily   Continuous Infusions: . remdesivir 100 mg in NS 100 mL       LOS: 1 day   Time spent: 35 minutes.  Lorella Nimrod, MD [facility]  If 7PM-7AM, please contact night-coverage [email or web address]  12/29/2020, 9:18 AM   This record has been created using Systems analyst. Errors have been sought and corrected,but may not always be located. Such creation errors  do not reflect on the standard of care.

## 2020-12-29 NOTE — ED Notes (Signed)
Pt soiled, bed and brief changed

## 2020-12-29 NOTE — ED Notes (Signed)
Wife given update via phone.

## 2020-12-29 NOTE — ED Notes (Signed)
[employer] here to fix central monitoring battery problem

## 2020-12-30 DIAGNOSIS — J9601 Acute respiratory failure with hypoxia: Secondary | ICD-10-CM | POA: Diagnosis not present

## 2020-12-30 DIAGNOSIS — J1282 Pneumonia due to coronavirus disease 2019: Secondary | ICD-10-CM | POA: Diagnosis not present

## 2020-12-30 DIAGNOSIS — U071 COVID-19: Secondary | ICD-10-CM | POA: Diagnosis not present

## 2020-12-30 LAB — CBC WITH DIFFERENTIAL/PLATELET
Abs Immature Granulocytes: 0.24 10*3/uL — ABNORMAL HIGH (ref 0.00–0.07)
Basophils Absolute: 0 10*3/uL (ref 0.0–0.1)
Basophils Relative: 0 %
Eosinophils Absolute: 0 10*3/uL (ref 0.0–0.5)
Eosinophils Relative: 0 %
HCT: 35.8 % — ABNORMAL LOW (ref 39.0–52.0)
Hemoglobin: 12.1 g/dL — ABNORMAL LOW (ref 13.0–17.0)
Immature Granulocytes: 1 %
Lymphocytes Relative: 4 %
Lymphs Abs: 0.6 10*3/uL — ABNORMAL LOW (ref 0.7–4.0)
MCH: 30.9 pg (ref 26.0–34.0)
MCHC: 33.8 g/dL (ref 30.0–36.0)
MCV: 91.6 fL (ref 80.0–100.0)
Monocytes Absolute: 0.7 10*3/uL (ref 0.1–1.0)
Monocytes Relative: 4 %
Neutro Abs: 16.4 10*3/uL — ABNORMAL HIGH (ref 1.7–7.7)
Neutrophils Relative %: 91 %
Platelets: 382 10*3/uL (ref 150–400)
RBC: 3.91 MIL/uL — ABNORMAL LOW (ref 4.22–5.81)
RDW: 13.1 % (ref 11.5–15.5)
WBC: 18 10*3/uL — ABNORMAL HIGH (ref 4.0–10.5)
nRBC: 0 % (ref 0.0–0.2)

## 2020-12-30 LAB — D-DIMER, QUANTITATIVE: D-Dimer, Quant: 0.55 ug/mL-FEU — ABNORMAL HIGH (ref 0.00–0.50)

## 2020-12-30 LAB — COMPREHENSIVE METABOLIC PANEL
ALT: 33 U/L (ref 0–44)
AST: 30 U/L (ref 15–41)
Albumin: 2.7 g/dL — ABNORMAL LOW (ref 3.5–5.0)
Alkaline Phosphatase: 68 U/L (ref 38–126)
Anion gap: 10 (ref 5–15)
BUN: 27 mg/dL — ABNORMAL HIGH (ref 8–23)
CO2: 27 mmol/L (ref 22–32)
Calcium: 8.3 mg/dL — ABNORMAL LOW (ref 8.9–10.3)
Chloride: 105 mmol/L (ref 98–111)
Creatinine, Ser: 0.74 mg/dL (ref 0.61–1.24)
GFR, Estimated: >60 mL/min (ref >60)
Glucose, Bld: 199 mg/dL — ABNORMAL HIGH (ref 70–99)
Potassium: 3.4 mmol/L — ABNORMAL LOW (ref 3.5–5.1)
Sodium: 142 mmol/L (ref 135–145)
Total Bilirubin: 0.4 mg/dL (ref 0.3–1.2)
Total Protein: 6 g/dL — ABNORMAL LOW (ref 6.5–8.1)

## 2020-12-30 LAB — HEMOGLOBIN A1C
Hgb A1c MFr Bld: 5.5 % (ref 4.8–5.6)
Mean Plasma Glucose: 111.15 mg/dL

## 2020-12-30 LAB — EXPECTORATED SPUTUM ASSESSMENT W GRAM STAIN, RFLX TO RESP C

## 2020-12-30 LAB — GLUCOSE, CAPILLARY
Glucose-Capillary: 168 mg/dL — ABNORMAL HIGH (ref 70–99)
Glucose-Capillary: 262 mg/dL — ABNORMAL HIGH (ref 70–99)
Glucose-Capillary: 141 mg/dL — ABNORMAL HIGH (ref 70–99)
Glucose-Capillary: 192 mg/dL — ABNORMAL HIGH (ref 70–99)

## 2020-12-30 LAB — C-REACTIVE PROTEIN: CRP: 16.1 mg/dL — ABNORMAL HIGH (ref <1.0)

## 2020-12-30 LAB — FERRITIN: Ferritin: 932 ng/mL — ABNORMAL HIGH (ref 24–336)

## 2020-12-30 LAB — PROCALCITONIN: Procalcitonin: <0.1 ng/mL

## 2020-12-30 MED ORDER — HYDRALAZINE HCL 25 MG PO TABS
25.0000 mg | ORAL_TABLET | Freq: Three times a day (TID) | ORAL | Status: DC | PRN
Start: 2020-12-30 — End: 2021-01-02
  Administered 2020-12-30 – 2021-01-02 (×2): 25 mg via ORAL
  Filled 2020-12-30 (×3): qty 1

## 2020-12-30 MED ORDER — MENTHOL 3 MG MT LOZG
1.0000 | LOZENGE | OROMUCOSAL | Status: DC | PRN
  Filled 2020-12-30 (×2): qty 9

## 2020-12-30 MED ORDER — CEPASTAT 14.5 MG MT LOZG
1.0000 | LOZENGE | OROMUCOSAL | Status: DC | PRN
  Filled 2020-12-30 (×2): qty 9

## 2020-12-30 MED ORDER — FUROSEMIDE 10 MG/ML IJ SOLN
40.0000 mg | Freq: Once | INTRAMUSCULAR | Status: AC
  Administered 2020-12-30: 40 mg via INTRAVENOUS
  Filled 2020-12-30: qty 4

## 2020-12-30 MED ORDER — SODIUM CHLORIDE 0.9 % IV SOLN: INTRAVENOUS | Status: DC | PRN

## 2020-12-30 MED ORDER — POTASSIUM CHLORIDE CRYS ER 20 MEQ PO TBCR
40.0000 meq | EXTENDED_RELEASE_TABLET | Freq: Once | ORAL | Status: AC
  Administered 2020-12-30: 40 meq via ORAL
  Filled 2020-12-30: qty 2

## 2020-12-30 MED ORDER — LOSARTAN POTASSIUM 50 MG PO TABS
50.0000 mg | ORAL_TABLET | Freq: Once | ORAL | Status: AC
  Administered 2020-12-30: 50 mg via ORAL
  Filled 2020-12-30: qty 1

## 2020-12-30 MED ORDER — LOSARTAN POTASSIUM 50 MG PO TABS
100.0000 mg | ORAL_TABLET | Freq: Every day | ORAL | Status: DC
  Administered 2020-12-31 – 2021-01-02 (×3): 100 mg via ORAL
  Filled 2020-12-30 (×3): qty 2

## 2020-12-30 MED ORDER — IPRATROPIUM-ALBUTEROL 20-100 MCG/ACT IN AERS
1.0000 | INHALATION_SPRAY | Freq: Four times a day (QID) | RESPIRATORY_TRACT | Status: DC
  Administered 2020-12-30 – 2021-01-13 (×54): 1 via RESPIRATORY_TRACT
  Filled 2020-12-30: qty 4

## 2020-12-30 MED ORDER — TOCILIZUMAB 400 MG/20ML IV SOLN
8.0000 mg/kg | Freq: Once | INTRAVENOUS | Status: AC
  Administered 2020-12-30: 708 mg via INTRAVENOUS
  Filled 2020-12-30: qty 35.4

## 2020-12-30 MED ORDER — ORAL CARE MOUTH RINSE
15.0000 mL | Freq: Two times a day (BID) | OROMUCOSAL | Status: DC
  Administered 2020-12-30 – 2021-01-13 (×24): 15 mL via OROMUCOSAL

## 2020-12-30 NOTE — Progress Notes (Signed)
Per patient request spoke with wife via phone to give update.    Fuller Mandril, RN

## 2020-12-30 NOTE — Plan of Care (Signed)
  Problem: Education: Goal: Knowledge of General Education information will improve Description: Including pain rating scale, medication(s)/side effects and non-pharmacologic comfort measures 12/30/2020 1308 by Orvan Seen, RN Outcome: Progressing 12/30/2020 1308 by Orvan Seen, RN Outcome: Progressing   Problem: Clinical Measurements: Goal: Respiratory complications will improve 12/30/2020 1308 by Orvan Seen, RN Outcome: Progressing 12/30/2020 1308 by Orvan Seen, RN Outcome: Progressing   Problem: Safety: Goal: Ability to remain free from injury will improve 12/30/2020 1308 by Orvan Seen, RN Outcome: Progressing 12/30/2020 1308 by Orvan Seen, RN Outcome: Progressing

## 2020-12-30 NOTE — Progress Notes (Signed)
PROGRESS NOTE    Nathaniel Ibarra  RSW:[id number] DOB: 1941/01/26 DOA: 12/28/2020 PCP: [name], MD   Brief Narrative: Taken from H&P Nathaniel Ibarra  is a 80 y.o. Caucasian male with a known history of hypertension, coronary artery disease, status post PCI and stent, paroxysmal atrial fibrillation, CHF, BPH and hypothyroidism, who presented to the emergency room with acute onset of worsening cough with dyspnea.  His symptoms started about a week and a half ago with sore throat followed by dry cough occasionally productive of whitish and greenish sputum.  He denies any fever chills.  He admitted to significant diminished sense of taste and smell.  He has been having fatigue and tiredness.  He denied any loss of appetite.  No nausea or vomiting or diarrhea.  He has been exposed to a friend with COVID prior to his symptoms.  His grandson and his wife tested positive yesterday for COVID-19 and their symptoms started 5 days ago. COVID-19 PCR was positive, chest x-ray with bilateral infiltrate consistent with atypical pneumonia/pulmonary edema. He was hypoxic in high 80s requiring 2 to 4 L of oxygen initially currently on 6 L. He was started on remdesivir, steroid and baricitinib. Baricitinib discontinued due to worsening leukocytosis.  patient is vaccinated with 2 doses and no booster.  Subjective: Patient remained little short of breath.  Saturating in mid 90s on 6 L of oxygen when seen today.  Assessment & Plan:   Active Problems:   Pneumonia due to COVID-19 virus  Acute hypoxic respiratory failure secondary to COVID-19 pneumonia. Patient with neutrophilic predominant leukocytosis, procalcitonin remain negative, markedly elevated CRP, D-dimer within normal limit,Inflammatory markers started improving. Clinically seems improving. -Continue with remdesivir-day 3 -Continue with steroid-day 3 -Continue with Zithromax. -Continue with lasix -Continue to monitor inflammatory  markers. -Continue with supplemental oxygen to keep the saturation above 90%. -Continue with supportive care and supplements -Discontinue IV fluid.  Paroxysmal atrial fibrillation.  Currently rate well controlled.  EKG ordered but has not done yet. -Continue with amiodarone and Eliquis.  Hypertension.  Blood pressure remained elevated. -Continue with home dose of spironolactone. -Increase the dose of Cozaar from 50 mg to 100 mg daily  Hypothyroidism. -Continue with home dose of Synthroid.  Hyperglycemia.  No prior diagnosis of diabetes, most likely steroid-induced. -Check A1c- 5.5 he is not diabetic -Continue SSI as needed for steroid-induced hyperglycemia.  BPH. -Continue home dose of Flomax and Proscar  Depression.  Paxil was listed in his chart but apparently patient stopped taking it. -Discontinue Paxil and monitor.  Objective: Vitals:   12/29/20 1936 12/29/20 2047 12/30/20 0042 12/30/20 0610  BP: (!) 150/64 (!) 153/57 (!) 162/65 (!) 161/64  Pulse: 60 (!) 59 (!) 59 64  Resp: (!) 22 (!) 24 (!) 24 16  Temp: 98.3 F (36.8 C) 97.6 F (36.4 C) 97.8 F (36.6 C) (!) 97.2 F (36.2 C)  TempSrc: Oral Oral Oral Oral  SpO2: 92% 91% 91% 94%  Weight:      Height:        Intake/Output Summary (Last 24 hours) at 12/30/2020 0820 Last data filed at 12/30/2020 0938 Gross per 24 hour  Intake 638 ml  Output 0 ml  Net 638 ml   Filed Weights   12/28/20 1143  Weight: 88.5 kg    Examination:  General.  Well-developed elderly man, in no acute distress. Pulmonary.  Lungs clear bilaterally, normal respiratory effort. CV.  Regular rate and rhythm, no JVD, rub or murmur. Abdomen.  Soft, nontender,  nondistended, BS positive. CNS.  Alert and oriented x3.  No focal neurologic deficit. Extremities.  No edema, no cyanosis, pulses intact and symmetrical. Psychiatry.  Judgment and insight appears normal.   DVT prophylaxis: Eliquis Code Status: Full Family Communication: Talked with  wife on phone. Disposition Plan:  Status is: Inpatient  Remains inpatient appropriate because:Inpatient level of care appropriate due to severity of illness   Dispo: The patient is from: Home              Anticipated d/c is to: Home              Anticipated d/c date is: 2 days              Patient currently is not medically stable to d/c.   Consultants:   None  Procedures:  Antimicrobials:  Doxycycline  Data Reviewed: I have personally reviewed following labs and imaging studies  CBC: Recent Labs  Lab 12/28/20 1148 12/29/20 0345 12/30/20 0417  WBC 12.2* 12.8* 18.0*  NEUTROABS  --  11.9* 16.4*  HGB 12.2* 12.6* 12.1*  HCT 37.8* 38.4* 35.8*  MCV 94.0 93.7 91.6  PLT 301 305 425   Basic Metabolic Panel: Recent Labs  Lab 12/28/20 1148 12/28/20 2047 12/29/20 0345 12/30/20 0417  NA 140  --  139 142  K 3.5  --  3.9 3.4*  CL 100  --  102 105  CO2 28  --  25 27  GLUCOSE 117*  --  215* 199*  BUN 26*  --  23 27*  CREATININE 1.21  --  0.93 0.74  CALCIUM 8.5*  --  8.6* 8.3*  MG  --  2.2  --   --    GFR: Estimated Creatinine Clearance: 82.2 mL/min (by C-G formula based on SCr of 0.74 mg/dL). Liver Function Tests: Recent Labs  Lab 12/29/20 0345 12/30/20 0417  AST 31 30  ALT 30 33  ALKPHOS 71 68  BILITOT 0.7 0.4  PROT 6.6 6.0*  ALBUMIN 3.1* 2.7*   No results for input(s): LIPASE, AMYLASE in the last 168 hours. No results for input(s): AMMONIA in the last 168 hours. Coagulation Profile: No results for input(s): INR, PROTIME in the last 168 hours. Cardiac Enzymes: No results for input(s): CKTOTAL, CKMB, CKMBINDEX, TROPONINI in the last 168 hours. BNP (last 3 results) No results for input(s): PROBNP in the last 8760 hours. HbA1C: No results for input(s): HGBA1C in the last 72 hours. CBG: Recent Labs  Lab 12/29/20 1701 12/29/20 2104 12/30/20 0745  GLUCAP 143* 165* 168*   Lipid Profile: No results for input(s): CHOL, HDL, LDLCALC, TRIG, CHOLHDL,  LDLDIRECT in the last 72 hours. Thyroid Function Tests: Recent Labs    12/28/20 2047  TSH 0.899   Anemia Panel: Recent Labs    12/29/20 0345 12/30/20 0417  FERRITIN 520* 932*   Sepsis Labs: Recent Labs  Lab 12/28/20 1148 12/28/20 2047 12/29/20 0345 12/30/20 0417  PROCALCITON <0.10 <0.10 <0.10 <0.10    No results found for this or any previous visit (from the past 240 hour(s)).   Radiology Studies: DG Chest 2 View  Result Date: 12/28/2020 CLINICAL DATA:  Cough, hypoxia, shortness of breath. Congestion. The patient states that family members tested positive for COVID yesterday. EXAM: CHEST - 2 VIEW COMPARISON:  11/25/2020 FINDINGS: Interval improvement of previous hazy opacity over the left mid chest. Continued indistinct airspace opacities at the right lung base and along the minor fissure. Interstitial accentuation at the lung bases. Mild  interstitial accentuation in the right mid lung. Mild enlargement of the cardiopericardial silhouette . No blunting of the costophrenic angles. IMPRESSION: 1. Multifocal interstitial and patchy airspace opacities. Differential diagnostic considerations include pulmonary edema or atypical pneumonia. 2. Interval clearing of prior left mid lung opacity shown on 11/25/2020. 3. Mild enlargement of the cardiopericardial silhouette Electronically Signed   By: Van Clines M.D.   On: 12/28/2020 13:13    Scheduled Meds: . amiodarone  200 mg Oral Daily  . apixaban  5 mg Oral BID  . vitamin C  500 mg Oral Daily  . cholecalciferol  1,000 Units Oral Daily  . doxycycline  100 mg Oral BID  . famotidine  20 mg Oral BID  . finasteride  5 mg Oral Daily  . furosemide  40 mg Oral Daily  . guaiFENesin  600 mg Oral BID  . insulin aspart  0-15 Units Subcutaneous TID WC  . insulin aspart  0-5 Units Subcutaneous QHS  . levothyroxine  100 mcg Oral Daily  . losartan  50 mg Oral Daily  . methylPREDNISolone (SOLU-MEDROL) injection  1 mg/kg Intravenous Q12H    Followed by  . [START ON 01/01/2021] predniSONE  50 mg Oral Daily  . spironolactone  12.5 mg Oral Daily  . tamsulosin  0.8 mg Oral Daily  . traZODone  50 mg Oral QHS  . zinc sulfate  220 mg Oral Daily   Continuous Infusions: . remdesivir 100 mg in NS 100 mL Stopped (12/29/20 1131)     LOS: 2 days   Time spent: 30 minutes.  Lorella Nimrod, MD [facility]  If 7PM-7AM, please contact night-coverage [email or web address]  12/30/2020, 8:20 AM   This record has been created using Systems analyst. Errors have been sought and corrected,but may not always be located. Such creation errors do not reflect on the standard of care.

## 2020-12-31 ENCOUNTER — Encounter: Payer: Self-pay | Admitting: Family Medicine

## 2020-12-31 ENCOUNTER — Inpatient Hospital Stay: Payer: PPO

## 2020-12-31 DIAGNOSIS — U071 COVID-19: Secondary | ICD-10-CM | POA: Diagnosis not present

## 2020-12-31 DIAGNOSIS — J9601 Acute respiratory failure with hypoxia: Secondary | ICD-10-CM | POA: Diagnosis not present

## 2020-12-31 DIAGNOSIS — J1282 Pneumonia due to coronavirus disease 2019: Secondary | ICD-10-CM | POA: Diagnosis not present

## 2020-12-31 LAB — CBC WITH DIFFERENTIAL/PLATELET
Abs Immature Granulocytes: 0.47 10*3/uL — ABNORMAL HIGH (ref 0.00–0.07)
Basophils Absolute: 0 10*3/uL (ref 0.0–0.1)
Basophils Relative: 0 %
Eosinophils Absolute: 0 10*3/uL (ref 0.0–0.5)
Eosinophils Relative: 0 %
HCT: 36.5 % — ABNORMAL LOW (ref 39.0–52.0)
Hemoglobin: 12.1 g/dL — ABNORMAL LOW (ref 13.0–17.0)
Immature Granulocytes: 3 %
Lymphocytes Relative: 3 %
Lymphs Abs: 0.5 10*3/uL — ABNORMAL LOW (ref 0.7–4.0)
MCH: 30.3 pg (ref 26.0–34.0)
MCHC: 33.2 g/dL (ref 30.0–36.0)
MCV: 91.5 fL (ref 80.0–100.0)
Monocytes Absolute: 0.6 10*3/uL (ref 0.1–1.0)
Monocytes Relative: 4 %
Neutro Abs: 16.3 10*3/uL — ABNORMAL HIGH (ref 1.7–7.7)
Neutrophils Relative %: 90 %
Platelets: 424 10*3/uL — ABNORMAL HIGH (ref 150–400)
RBC: 3.99 MIL/uL — ABNORMAL LOW (ref 4.22–5.81)
RDW: 13.1 % (ref 11.5–15.5)
WBC: 17.9 10*3/uL — ABNORMAL HIGH (ref 4.0–10.5)
nRBC: 0 % (ref 0.0–0.2)

## 2020-12-31 LAB — GLUCOSE, CAPILLARY
Glucose-Capillary: 177 mg/dL — ABNORMAL HIGH (ref 70–99)
Glucose-Capillary: 143 mg/dL — ABNORMAL HIGH (ref 70–99)
Glucose-Capillary: 221 mg/dL — ABNORMAL HIGH (ref 70–99)
Glucose-Capillary: 217 mg/dL — ABNORMAL HIGH (ref 70–99)
Glucose-Capillary: 281 mg/dL — ABNORMAL HIGH (ref 70–99)

## 2020-12-31 LAB — FERRITIN: Ferritin: 709 ng/mL — ABNORMAL HIGH (ref 24–336)

## 2020-12-31 LAB — COMPREHENSIVE METABOLIC PANEL
ALT: 35 U/L (ref 0–44)
AST: 25 U/L (ref 15–41)
Albumin: 2.7 g/dL — ABNORMAL LOW (ref 3.5–5.0)
Alkaline Phosphatase: 76 U/L (ref 38–126)
Anion gap: 8 (ref 5–15)
BUN: 29 mg/dL — ABNORMAL HIGH (ref 8–23)
CO2: 30 mmol/L (ref 22–32)
Calcium: 8.4 mg/dL — ABNORMAL LOW (ref 8.9–10.3)
Chloride: 104 mmol/L (ref 98–111)
Creatinine, Ser: 1.01 mg/dL (ref 0.61–1.24)
GFR, Estimated: >60 mL/min (ref >60)
Glucose, Bld: 188 mg/dL — ABNORMAL HIGH (ref 70–99)
Potassium: 4 mmol/L (ref 3.5–5.1)
Sodium: 142 mmol/L (ref 135–145)
Total Bilirubin: 0.5 mg/dL (ref 0.3–1.2)
Total Protein: 5.8 g/dL — ABNORMAL LOW (ref 6.5–8.1)

## 2020-12-31 LAB — C-REACTIVE PROTEIN: CRP: 8.5 mg/dL — ABNORMAL HIGH (ref <1.0)

## 2020-12-31 LAB — FIBRIN DERIVATIVES D-DIMER (ARMC ONLY): Fibrin derivatives D-dimer (ARMC): 1130.82 ng/mL (FEU) — ABNORMAL HIGH (ref 0.00–499.00)

## 2020-12-31 MED ORDER — KETOROLAC TROMETHAMINE 15 MG/ML IJ SOLN
15.0000 mg | Freq: Four times a day (QID) | INTRAMUSCULAR | Status: DC | PRN
  Administered 2020-12-31 – 2021-01-01 (×2): 15 mg via INTRAVENOUS
  Filled 2020-12-31 (×2): qty 1

## 2020-12-31 MED ORDER — IOHEXOL 350 MG/ML SOLN
75.0000 mL | Freq: Once | INTRAVENOUS | Status: AC | PRN
  Administered 2020-12-31: 75 mL via INTRAVENOUS

## 2020-12-31 NOTE — Progress Notes (Signed)
RT to patient bedside due to desat, patient placed on HFNC at this time.

## 2020-12-31 NOTE — Progress Notes (Signed)
PROGRESS NOTE    Nathaniel Ibarra  WUJ:[id number] DOB: [birth year] DOA: 12/28/2020 PCP: [name], MD   Brief Narrative: Taken from H&P Nathaniel Ibarra  is a 80 y.o. Caucasian male with a known history of hypertension, coronary artery disease, status post PCI and stent, paroxysmal atrial fibrillation, CHF, BPH and hypothyroidism, who presented to the emergency room with acute onset of worsening cough with dyspnea.  His symptoms started about a week and a half ago with sore throat followed by dry cough occasionally productive of whitish and greenish sputum.  He denies any fever chills.  He admitted to significant diminished sense of taste and smell.  He has been having fatigue and tiredness.  He denied any loss of appetite.  No nausea or vomiting or diarrhea.  He has been exposed to a friend with COVID prior to his symptoms.  His grandson and his wife tested positive yesterday for COVID-19 and their symptoms started 5 days ago. COVID-19 PCR was positive, chest x-ray with bilateral infiltrate consistent with atypical pneumonia/pulmonary edema. He was hypoxic in high 80s requiring 2 to 4 L of oxygen initially currently on 6 L. He was started on remdesivir, steroid and baricitinib. Baricitinib discontinued due to worsening leukocytosis.  patient is vaccinated with 2 doses and no booster. Patient desaturated later in the day requiring nonrebreather, 1 dose of Actemra given.  CTA ordered for today although he is on Eliquis, which was negative for PE although films were little motion degraded.  Subjective: Patient seems little improving today.  Oxygen requirement decreased to 6 L from nonrebreather.  Still little short of breath, stating that he just feels that his lungs are not expanding completely.  Assessment & Plan:   Active Problems:   Pneumonia due to COVID-19 virus  Acute hypoxic respiratory failure secondary to COVID-19 pneumonia. Patient with neutrophilic predominant leukocytosis,  procalcitonin remain negative, markedly elevated CRP, D-dimer trending up, 1 dose of Actemra given yesterday due to worsening oxygen requirement. -Get CTA-patient was already on Eliquis, it was negative for PE but films were little motion degraded. -Continue with remdesivir-day 4 -Continue with steroid-day 4 -Continue with Zithromax. -Continue with lasix -Continue to monitor inflammatory markers. -Continue with supplemental oxygen to keep the saturation above 90%. -Continue with supportive care and supplements -Discontinue IV fluid.  Paroxysmal atrial fibrillation.  Currently rate well controlled.  EKG ordered but has not done yet. -Continue with amiodarone and Eliquis.  Hypertension.  Blood pressure remained elevated. -Continue with home dose of spironolactone. -Continue with increased dose of Cozaar. -Hydralazine 25 mg every 8 hourly as needed.  Hypothyroidism. -Continue with home dose of Synthroid.  Hyperglycemia.  No prior diagnosis of diabetes, most likely steroid-induced. -Check A1c- 5.5 he is not diabetic -Continue SSI as needed for steroid-induced hyperglycemia.  BPH. -Continue home dose of Flomax and Proscar  Depression.  Paxil was listed in his chart but apparently patient stopped taking it. -Discontinue Paxil and monitor.  Objective: Vitals:   12/31/20 0406 12/31/20 0504 12/31/20 0518 12/31/20 0806  BP: (!) 175/66   (!) 156/84  Pulse: 61   (!) 56  Resp: 18   19  Temp: 97.6 F (36.4 C)   97.8 F (36.6 C)  TempSrc: Oral   Oral  SpO2: (!) 89% 92% 91% 100%  Weight:      Height:        Intake/Output Summary (Last 24 hours) at 12/31/2020 0858 Last data filed at 12/30/2020 2312 Gross per 24 hour  Intake 100  ml  Output 1050 ml  Net -950 ml   Filed Weights   12/28/20 1143  Weight: 88.5 kg    Examination:  General.  Well-developed elderly man, in no acute distress. Pulmonary.  Lungs clear bilaterally, normal respiratory effort. CV.  Regular rate and  rhythm, no JVD, rub or murmur. Abdomen.  Soft, nontender, nondistended, BS positive. CNS.  Alert and oriented x3.  No focal neurologic deficit. Extremities.  No edema, no cyanosis, pulses intact and symmetrical. Psychiatry.  Judgment and insight appears normal.  DVT prophylaxis: Eliquis Code Status: Full Family Communication: Talked with wife on phone. Disposition Plan:  Status is: Inpatient  Remains inpatient appropriate because:Inpatient level of care appropriate due to severity of illness   Dispo: The patient is from: Home              Anticipated d/c is to: Home              Anticipated d/c date is: 1-2 days.               Patient currently is not medically stable to d/c.   Consultants:   None  Procedures:  Antimicrobials:  Doxycycline  Data Reviewed: I have personally reviewed following labs and imaging studies  CBC: Recent Labs  Lab 12/28/20 1148 12/29/20 0345 12/30/20 0417 12/31/20 0352  WBC 12.2* 12.8* 18.0* 17.9*  NEUTROABS  --  11.9* 16.4* 16.3*  HGB 12.2* 12.6* 12.1* 12.1*  HCT 37.8* 38.4* 35.8* 36.5*  MCV 94.0 93.7 91.6 91.5  PLT 301 305 382 [id number]*   Basic Metabolic Panel: Recent Labs  Lab 12/28/20 1148 12/28/20 2047 12/29/20 0345 12/30/20 0417 12/31/20 0352  NA 140  --  139 142 142  K 3.5  --  3.9 3.4* 4.0  CL 100  --  102 105 104  CO2 28  --  25 27 30   GLUCOSE 117*  --  215* 199* 188*  BUN 26*  --  23 27* 29*  CREATININE 1.21  --  0.93 0.74 1.01  CALCIUM 8.5*  --  8.6* 8.3* 8.4*  MG  --  2.2  --   --   --    GFR: Estimated Creatinine Clearance: 65.1 mL/min (by C-G formula based on SCr of 1.01 mg/dL). Liver Function Tests: Recent Labs  Lab 12/29/20 0345 12/30/20 0417 12/31/20 0352  AST 31 30 25   ALT 30 33 35  ALKPHOS 71 68 76  BILITOT 0.7 0.4 0.5  PROT 6.6 6.0* 5.8*  ALBUMIN 3.1* 2.7* 2.7*   No results for input(s): LIPASE, AMYLASE in the last 168 hours. No results for input(s): AMMONIA in the last 168 hours. Coagulation  Profile: No results for input(s): INR, PROTIME in the last 168 hours. Cardiac Enzymes: No results for input(s): CKTOTAL, CKMB, CKMBINDEX, TROPONINI in the last 168 hours. BNP (last 3 results) No results for input(s): PROBNP in the last 8760 hours. HbA1C: Recent Labs    12/30/20 0417  HGBA1C 5.5   CBG: Recent Labs  Lab 12/30/20 1115 12/30/20 1637 12/30/20 2006 12/31/20 0348 12/31/20 0759  GLUCAP 262* 141* 192* 177* 143*   Lipid Profile: No results for input(s): CHOL, HDL, LDLCALC, TRIG, CHOLHDL, LDLDIRECT in the last 72 hours. Thyroid Function Tests: Recent Labs    12/28/20 2047  TSH 0.899   Anemia Panel: Recent Labs    12/30/20 0417 12/31/20 0352  FERRITIN 932* 709*   Sepsis Labs: Recent Labs  Lab 12/28/20 1148 12/28/20 2047 12/29/20 0345 12/30/20 0417  PROCALCITON <0.10 <0.10 <0.10 <0.10    Recent Results (from the past 240 hour(s))  Culture, sputum-assessment     Status: None   Collection Time: 12/30/20  9:59 AM   Specimen: Sputum  Result Value Ref Range Status   Specimen Description SPUTUM  Final   Special Requests NONE  Final   Sputum evaluation   Final    THIS SPECIMEN IS ACCEPTABLE FOR SPUTUM CULTURE Performed at [facility], [address] [place], [place] [address]    Report Status 12/30/2020 FINAL  Final  Culture, respiratory     Status: None (Preliminary result)   Collection Time: 12/30/20  9:59 AM   Specimen: SPU  Result Value Ref Range Status   Specimen Description   Final    SPUTUM Performed at [facility], [address], [place], [place] [address]    Special Requests   Final    NONE Reflexed from [id number] Performed at [facility], [address] [place], [place] [address]    Gram Stain   Final    RARE WBC PRESENT, PREDOMINANTLY PMN FEW GRAM POSITIVE COCCI IN CHAINS RARE GRAM POSITIVE RODS    Culture   Final    TOO YOUNG TO READ Performed at [facility], [place] [address] [place],  [place] [address]    Report Status PENDING  Incomplete     Radiology Studies: No results found.  Scheduled Meds: . amiodarone  200 mg Oral Daily  . apixaban  5 mg Oral BID  . vitamin C  500 mg Oral Daily  . cholecalciferol  1,000 Units Oral Daily  . doxycycline  100 mg Oral BID  . famotidine  20 mg Oral BID  . finasteride  5 mg Oral Daily  . furosemide  40 mg Oral Daily  . guaiFENesin  600 mg Oral BID  . insulin aspart  0-15 Units Subcutaneous TID WC  . insulin aspart  0-5 Units Subcutaneous QHS  . Ipratropium-Albuterol  1 puff Inhalation Q6H  . levothyroxine  100 mcg Oral Daily  . losartan  100 mg Oral Daily  . mouth rinse  15 mL Mouth Rinse BID  . methylPREDNISolone (SOLU-MEDROL) injection  1 mg/kg Intravenous Q12H   Followed by  . [START ON 01/01/2021] predniSONE  50 mg Oral Daily  . spironolactone  12.5 mg Oral Daily  . tamsulosin  0.8 mg Oral Daily  . traZODone  50 mg Oral QHS  . zinc sulfate  220 mg Oral Daily   Continuous Infusions: . sodium chloride 10 mL/hr at 12/30/20 1726  . remdesivir 100 mg in NS 100 mL 100 mg (12/30/20 1132)     LOS: 3 days   Time spent: 30 minutes.  Lorella Nimrod, MD [facility]  If 7PM-7AM, please contact night-coverage [email or web address]  12/31/2020, 8:58 AM   This record has been created using Systems analyst. Errors have been sought and corrected,but may not always be located. Such creation errors do not reflect on the standard of care.

## 2020-12-31 NOTE — Progress Notes (Signed)
Patient with oxygen off.  Replaced oxygen nasal cannula to nose and education given to breathe through nose out through mouth.  Oxygen sat 92% on 11 L.  Incentive spirometry demonstrated appropriately and encouraged use.  Strong cough with thick secretions.

## 2020-12-31 NOTE — Progress Notes (Signed)
   12/30/20 1143  Assess: MEWS Score  Temp 97.7 F (36.5 C)  BP (!) 184/76  Pulse Rate (!) 58  Resp (!) 27  Level of Consciousness Alert  SpO2 96 %  O2 Device Nasal Cannula  O2 Flow Rate (L/min) 6 L/min  Assess: MEWS Score  MEWS Temp 0  MEWS Systolic 0  MEWS Pulse 0  MEWS RR 2  MEWS LOC 0  MEWS Score 2  MEWS Score Color Yellow  Assess: if the MEWS score is Yellow or Red  Were vital signs taken at a resting state? Yes  Focused Assessment No change from prior assessment  Early Detection of Sepsis Score *See Row Information* Low  MEWS guidelines implemented *See Row Information* Yes  Treat  MEWS Interventions Other (Comment) (MD notified)  Pain Scale 0-10  Pain Score 0  Take Vital Signs  Increase Vital Sign Frequency  Yellow: Q 2hr X 2 then Q 4hr X 2, if remains yellow, continue Q 4hrs  Escalate  MEWS: Escalate Yellow: discuss with charge nurse/RN and consider discussing with provider and RRT  Notify: Charge Nurse/RN  Name of Charge Nurse/RN Notified Jordan Hawks  Date Charge Nurse/RN Notified 12/30/20  Time Charge Nurse/RN Notified [id number]  Notify: Provider  Provider Name/Title Dr Reesa Chew  Date Provider Notified 12/30/20  Time Provider Notified 1146  Notification Type Call  Notification Reason Change in status  Response  (waiting for reply)  Inserted for Rochel Brome RN

## 2021-01-01 DIAGNOSIS — J9601 Acute respiratory failure with hypoxia: Secondary | ICD-10-CM | POA: Diagnosis not present

## 2021-01-01 DIAGNOSIS — J1282 Pneumonia due to coronavirus disease 2019: Secondary | ICD-10-CM | POA: Diagnosis not present

## 2021-01-01 DIAGNOSIS — U071 COVID-19: Secondary | ICD-10-CM | POA: Diagnosis not present

## 2021-01-01 LAB — CBC WITH DIFFERENTIAL/PLATELET
Abs Immature Granulocytes: 0.71 10*3/uL — ABNORMAL HIGH (ref 0.00–0.07)
Basophils Absolute: 0.1 10*3/uL (ref 0.0–0.1)
Basophils Relative: 0 %
Eosinophils Absolute: 0.1 10*3/uL (ref 0.0–0.5)
Eosinophils Relative: 0 %
HCT: 35.5 % — ABNORMAL LOW (ref 39.0–52.0)
Hemoglobin: 12 g/dL — ABNORMAL LOW (ref 13.0–17.0)
Immature Granulocytes: 4 %
Lymphocytes Relative: 2 %
Lymphs Abs: 0.4 10*3/uL — ABNORMAL LOW (ref 0.7–4.0)
MCH: 31 pg (ref 26.0–34.0)
MCHC: 33.8 g/dL (ref 30.0–36.0)
MCV: 91.7 fL (ref 80.0–100.0)
Monocytes Absolute: 0.7 10*3/uL (ref 0.1–1.0)
Monocytes Relative: 4 %
Neutro Abs: 18.4 10*3/uL — ABNORMAL HIGH (ref 1.7–7.7)
Neutrophils Relative %: 90 %
Platelets: 387 10*3/uL (ref 150–400)
RBC: 3.87 MIL/uL — ABNORMAL LOW (ref 4.22–5.81)
RDW: 13.2 % (ref 11.5–15.5)
WBC: 20.4 10*3/uL — ABNORMAL HIGH (ref 4.0–10.5)
nRBC: 0 % (ref 0.0–0.2)

## 2021-01-01 LAB — C-REACTIVE PROTEIN: CRP: 4.5 mg/dL — ABNORMAL HIGH (ref <1.0)

## 2021-01-01 LAB — COMPREHENSIVE METABOLIC PANEL
ALT: 40 U/L (ref 0–44)
AST: 26 U/L (ref 15–41)
Albumin: 2.5 g/dL — ABNORMAL LOW (ref 3.5–5.0)
Alkaline Phosphatase: 84 U/L (ref 38–126)
Anion gap: 8 (ref 5–15)
BUN: 32 mg/dL — ABNORMAL HIGH (ref 8–23)
CO2: 29 mmol/L (ref 22–32)
Calcium: 8.1 mg/dL — ABNORMAL LOW (ref 8.9–10.3)
Chloride: 104 mmol/L (ref 98–111)
Creatinine, Ser: 0.96 mg/dL (ref 0.61–1.24)
GFR, Estimated: >60 mL/min (ref >60)
Glucose, Bld: 227 mg/dL — ABNORMAL HIGH (ref 70–99)
Potassium: 3.4 mmol/L — ABNORMAL LOW (ref 3.5–5.1)
Sodium: 141 mmol/L (ref 135–145)
Total Bilirubin: 0.6 mg/dL (ref 0.3–1.2)
Total Protein: 5.4 g/dL — ABNORMAL LOW (ref 6.5–8.1)

## 2021-01-01 LAB — MRSA PCR SCREENING: MRSA by PCR: NEGATIVE

## 2021-01-01 LAB — GLUCOSE, CAPILLARY
Glucose-Capillary: 125 mg/dL — ABNORMAL HIGH (ref 70–99)
Glucose-Capillary: 241 mg/dL — ABNORMAL HIGH (ref 70–99)
Glucose-Capillary: 195 mg/dL — ABNORMAL HIGH (ref 70–99)
Glucose-Capillary: 203 mg/dL — ABNORMAL HIGH (ref 70–99)

## 2021-01-01 LAB — FERRITIN: Ferritin: 440 ng/mL — ABNORMAL HIGH (ref 24–336)

## 2021-01-01 LAB — FIBRIN DERIVATIVES D-DIMER (ARMC ONLY): Fibrin derivatives D-dimer (ARMC): 2588.88 ng/mL (FEU) — ABNORMAL HIGH (ref 0.00–499.00)

## 2021-01-01 MED ORDER — METHYLPREDNISOLONE SODIUM SUCC 40 MG IJ SOLR
40.0000 mg | Freq: Two times a day (BID) | INTRAMUSCULAR | Status: DC
  Administered 2021-01-01 (×2): 40 mg via INTRAVENOUS
  Filled 2021-01-01 (×2): qty 1

## 2021-01-01 MED ORDER — POTASSIUM CHLORIDE CRYS ER 20 MEQ PO TBCR
40.0000 meq | EXTENDED_RELEASE_TABLET | Freq: Once | ORAL | Status: AC
  Administered 2021-01-01: 40 meq via ORAL
  Filled 2021-01-01: qty 2

## 2021-01-01 MED ORDER — DIPHENHYDRAMINE HCL 12.5 MG/5ML PO ELIX
12.5000 mg | ORAL_SOLUTION | Freq: Once | ORAL | Status: AC
  Administered 2021-01-01: 12.5 mg via ORAL
  Filled 2021-01-01 (×2): qty 5

## 2021-01-01 MED ORDER — SODIUM CHLORIDE 0.9 % IV SOLN
2.0000 g | INTRAVENOUS | Status: DC
  Administered 2021-01-01 – 2021-01-03 (×3): 2 g via INTRAVENOUS
  Filled 2021-01-01 (×4): qty 20

## 2021-01-01 MED ORDER — SODIUM CHLORIDE 0.9 % IV SOLN
500.0000 mg | INTRAVENOUS | Status: DC
  Administered 2021-01-01 – 2021-01-03 (×3): 500 mg via INTRAVENOUS
  Filled 2021-01-01 (×4): qty 500

## 2021-01-01 MED ORDER — TOCILIZUMAB 400 MG/20ML IV SOLN
8.0000 mg/kg | Freq: Once | INTRAVENOUS | Status: AC
  Administered 2021-01-01: 708 mg via INTRAVENOUS
  Filled 2021-01-01: qty 35.4

## 2021-01-01 NOTE — Progress Notes (Signed)
Received patient from [facility]. Patient is awake, alert and oriented. No signs of distress. Patient is talking on the phone at this time. No complaints of pain. Vss. Will continue to monitor

## 2021-01-01 NOTE — Treatment Plan (Signed)
Pt brought with Erica Rn to unit 2A room 241. Report called to charge nurse at this time. Pt placed on 15L non rebreather per respiratory at this time. Pt stayed at 92% oxygen via transport. Pts wife at bedside and educated pt since she was covid + she had to leave unit as well (test positive Sunday per wife).  Charge nurse made aware that patient needed tele and continuous pulse ox. Tele called as well and made aware of transfer. Respiratory met patient in room at this time to connect ordered oxygen. Pt not in distress during transfer. Belongings brought with patient at this time in patient belonging bags. KLB, RN.  

## 2021-01-01 NOTE — Progress Notes (Signed)
Called by RN due to low saturations.  Patient was prone when I came to the room and sat's were 96% on 15LPM HFNC.  Patient seems to be fine currently and I communicated this to the RN.  Physician has now placed orders for patient to be placed on heated high flow [place].  Spoke wih charge RN [facility] does not do heated HFNC on this floor and has asked for transfer to 2A.  Spoke with patient RN about this she states patient is fine right now and we have placed a non rebreather mask at bedside on 15lpm if saturations drop.

## 2021-01-01 NOTE — Care Plan (Signed)
Pt keeps taking off high flow stating he cannot breathe. Writer went into room and patient trying to get out of bed., Re educated patient about need for high flow and non rebreather. These 2 items were placed back on patient at this time, sat 92%. Medications given as ordered.

## 2021-01-01 NOTE — Progress Notes (Signed)
PROGRESS NOTE    Nathaniel Ibarra  KDT:[id number] DOB: [birth year] DOA: 12/28/2020 PCP: [name], MD   Brief Narrative: Taken from H&P Nathaniel Ibarra  is a 80 y.o. Caucasian male with a known history of hypertension, coronary artery disease, status post PCI and stent, paroxysmal atrial fibrillation, CHF, BPH and hypothyroidism, who presented to the emergency room with acute onset of worsening cough with dyspnea.  His symptoms started about a week and a half ago with sore throat followed by dry cough occasionally productive of whitish and greenish sputum.  He denies any fever chills.  He admitted to significant diminished sense of taste and smell.  He has been having fatigue and tiredness.  He denied any loss of appetite.  No nausea or vomiting or diarrhea.  He has been exposed to a friend with COVID prior to his symptoms.  His grandson and his wife tested positive yesterday for COVID-19 and their symptoms started 5 days ago. COVID-19 PCR was positive, chest x-ray with bilateral infiltrate consistent with atypical pneumonia/pulmonary edema. He was hypoxic in high 80s requiring 2 to 4 L of oxygen initially currently on 6 L. He was started on remdesivir, steroid and baricitinib. Baricitinib discontinued due to worsening leukocytosis.  patient is vaccinated with 2 doses and no booster. Patient desaturated later in the day requiring nonrebreather, 1 dose of Actemra given.  CTA ordered for today although he is on Eliquis, which was negative for PE although films were little motion degraded. Worsening oxygen requirement today-transitioning to heated high flow.  Subjective: Patient was eating breakfast when seen today.  Able to speak full sentences. Desaturating in mid 80s on 15 L-waiting for transfer to progressive care unit for heated high flow.  Talked with wife on his phone.  Assessment & Plan:   Active Problems:   Pneumonia due to COVID-19 virus  Acute hypoxic respiratory failure  secondary to COVID-19 pneumonia. Patient with neutrophilic predominant leukocytosis, procalcitonin remain negative, markedly elevated CRP, D-dimer trending up, 1 dose of Actemra given yesterday due to worsening oxygen requirement. -Get CTA-patient was already on Eliquis, it was negative for PE but films were little motion degraded. 1/14.  Patient has worsening oxygen requirement today, CTA with extensive bilateral infiltrate, will complete the course of remdesivir today.  Worsening leukocytosis with left shift.  Worsening inflammatory markers.  MRSA PCR negative. -Give him another dose of Actemra. -Add ceftriaxone and Zithromax. -Continue with remdesivir-day 5 -Continue with steroid-day 5 -Continue with lasix -Continue to monitor inflammatory markers. -Continue with supplemental oxygen to keep the saturation above 90%. -Continue with supportive care and supplements.  Paroxysmal atrial fibrillation.  Currently rate well controlled.  EKG ordered but has not done yet. -Continue with amiodarone and Eliquis.  Hypertension.  Blood pressure remained elevated. -Continue with home dose of spironolactone. -Continue with increased dose of Cozaar. -Hydralazine 25 mg every 8 hourly as needed.  Hypothyroidism. -Continue with home dose of Synthroid.  Hyperglycemia.  No prior diagnosis of diabetes, most likely steroid-induced. -Check A1c- 5.5 he is not diabetic -Continue SSI as needed for steroid-induced hyperglycemia.  BPH. -Continue home dose of Flomax and Proscar  Depression.  Paxil was listed in his chart but apparently patient stopped taking it. -Discontinue Paxil and monitor.  Objective: Vitals:   12/31/20 2044 12/31/20 2245 01/01/21 0534 01/01/21 0745  BP: (!) 159/64 (!) 171/72 (!) 185/64 (!) 152/85  Pulse: (!) 57 (!) 56 (!) 59 61  Resp:  (!) 24 (!) 24 20  Temp:  98  F (36.7 C) (!) 97.3 F (36.3 C) 97.6 F (36.4 C)  TempSrc:  Oral Oral Oral  SpO2:  95% 91% (!) 87%  Weight:       Height:        Intake/Output Summary (Last 24 hours) at 01/01/2021 0848 Last data filed at 01/01/2021 0555 Gross per 24 hour  Intake 176.73 ml  Output 1100 ml  Net -923.27 ml   Filed Weights   12/28/20 1143  Weight: 88.5 kg    Examination:  General.  Well-developed elderly man, in no acute distress. Pulmonary.  Decreased breath sound at bases, normal respiratory effort. CV.  Regular rate and rhythm, no JVD, rub or murmur. Abdomen.  Soft, nontender, nondistended, BS positive. CNS.  Alert and oriented x3.  No focal neurologic deficit. Extremities.  No edema, no cyanosis, pulses intact and symmetrical. Psychiatry.  Judgment and insight appears normal.  DVT prophylaxis: Eliquis Code Status: Full Family Communication: Talked with wife on phone. Disposition Plan:  Status is: Inpatient  Remains inpatient appropriate because:Inpatient level of care appropriate due to severity of illness   Dispo: The patient is from: Home              Anticipated d/c is to: Home              Anticipated d/c date is: 1-2 days.               Patient currently is not medically stable to d/c.   Consultants:   None  Procedures:  Antimicrobials:  Ceftriaxone Zithromax  Data Reviewed: I have personally reviewed following labs and imaging studies  CBC: Recent Labs  Lab 12/28/20 1148 12/29/20 0345 12/30/20 0417 12/31/20 0352 01/01/21 0248  WBC 12.2* 12.8* 18.0* 17.9* 20.4*  NEUTROABS  --  11.9* 16.4* 16.3* 18.4*  HGB 12.2* 12.6* 12.1* 12.1* 12.0*  HCT 37.8* 38.4* 35.8* 36.5* 35.5*  MCV 94.0 93.7 91.6 91.5 91.7  PLT 301 305 382 424* 741   Basic Metabolic Panel: Recent Labs  Lab 12/28/20 1148 12/28/20 2047 12/29/20 0345 12/30/20 0417 12/31/20 0352 01/01/21 0248  NA 140  --  139 142 142 141  K 3.5  --  3.9 3.4* 4.0 3.4*  CL 100  --  102 105 104 104  CO2 28  --  25 27 30 29   GLUCOSE 117*  --  215* 199* 188* 227*  BUN 26*  --  23 27* 29* 32*  CREATININE 1.21  --  0.93 0.74  1.01 0.96  CALCIUM 8.5*  --  8.6* 8.3* 8.4* 8.1*  MG  --  2.2  --   --   --   --    GFR: Estimated Creatinine Clearance: 68.5 mL/min (by C-G formula based on SCr of 0.96 mg/dL). Liver Function Tests: Recent Labs  Lab 12/29/20 0345 12/30/20 0417 12/31/20 0352 01/01/21 0248  AST 31 30 25 26   ALT 30 33 35 40  ALKPHOS 71 68 76 84  BILITOT 0.7 0.4 0.5 0.6  PROT 6.6 6.0* 5.8* 5.4*  ALBUMIN 3.1* 2.7* 2.7* 2.5*   No results for input(s): LIPASE, AMYLASE in the last 168 hours. No results for input(s): AMMONIA in the last 168 hours. Coagulation Profile: No results for input(s): INR, PROTIME in the last 168 hours. Cardiac Enzymes: No results for input(s): CKTOTAL, CKMB, CKMBINDEX, TROPONINI in the last 168 hours. BNP (last 3 results) No results for input(s): PROBNP in the last 8760 hours. HbA1C: Recent Labs    12/30/20 0417  HGBA1C 5.5   CBG: Recent Labs  Lab 12/31/20 0348 12/31/20 0759 12/31/20 1111 12/31/20 1659 12/31/20 2012  GLUCAP 177* 143* 221* 217* 281*   Lipid Profile: No results for input(s): CHOL, HDL, LDLCALC, TRIG, CHOLHDL, LDLDIRECT in the last 72 hours. Thyroid Function Tests: No results for input(s): TSH, T4TOTAL, FREET4, T3FREE, THYROIDAB in the last 72 hours. Anemia Panel: Recent Labs    12/31/20 0352 01/01/21 0248  FERRITIN 709* 440*   Sepsis Labs: Recent Labs  Lab 12/28/20 1148 12/28/20 2047 12/29/20 0345 12/30/20 0417  PROCALCITON <0.10 <0.10 <0.10 <0.10    Recent Results (from the past 240 hour(s))  Culture, sputum-assessment     Status: None   Collection Time: 12/30/20  9:59 AM   Specimen: Sputum  Result Value Ref Range Status   Specimen Description SPUTUM  Final   Special Requests NONE  Final   Sputum evaluation   Final    THIS SPECIMEN IS ACCEPTABLE FOR SPUTUM CULTURE Performed at [facility], [address] [place], [place] [address]    Report Status 12/30/2020 FINAL  Final  Culture, respiratory     Status: None  (Preliminary result)   Collection Time: 12/30/20  9:59 AM   Specimen: SPU  Result Value Ref Range Status   Specimen Description   Final    SPUTUM Performed at [facility], [address] [place], [place] [address]    Special Requests   Final    NONE Reflexed from [id number] Performed at [facility], [address] [place], [place] [address]    Gram Stain   Final    RARE WBC PRESENT, PREDOMINANTLY PMN FEW GRAM POSITIVE COCCI IN CHAINS RARE GRAM POSITIVE RODS    Culture   Final    TOO YOUNG TO READ Performed at [facility], [place] [address] [place], [place] [address]    Report Status PENDING  Incomplete     Radiology Studies: CT ANGIO CHEST PE W OR WO CONTRAST  Result Date: 12/31/2020 CLINICAL DATA:  COVID-19 positive, shortness of breath, positive D-dimer EXAM: CT ANGIOGRAPHY CHEST WITH CONTRAST TECHNIQUE: Multidetector CT imaging of the chest was performed using the standard protocol during bolus administration of intravenous contrast. Multiplanar CT image reconstructions and MIPs were obtained to evaluate the vascular anatomy. CONTRAST:  57mL OMNIPAQUE IOHEXOL 350 MG/ML SOLN COMPARISON:  12/28/2020 FINDINGS: Cardiovascular: Satisfactory opacification of the pulmonary arteries. Evaluation of the segmental and subsegmental branches is degraded by excessive respiratory motion artifact. No filling defect within the central, main, or lobar branch pulmonary arteries. No evidence of right heart strain. Heart size is mildly enlarged. No pericardial effusion. Thoracic aorta is nonaneurysmal. Atherosclerotic calcification of the aorta and coronary arteries. Mediastinum/Nodes: No axillary, mediastinal, or hilar lymphadenopathy. Trachea and esophagus within normal limits. Lungs/Pleura: Extensive patchy ground-glass opacity throughout both lungs, right greater than left. No pleural effusion or pneumothorax. Upper Abdomen: No acute abnormality. Musculoskeletal: No chest wall  abnormality. No acute or significant osseous findings. Review of the MIP images confirms the above findings. IMPRESSION: 1. Evaluation of the segmental and subsegmental branches is degraded by excessive respiratory motion artifact. No evidence of pulmonary embolism to the lobar branch level. 2. Extensive patchy ground-glass opacity throughout both lungs, right greater than left, consistent with multifocal pneumonia in the setting of known COVID-19 infection. Aortic Atherosclerosis (ICD10-I70.0). Electronically Signed   By: Davina Poke D.O.   On: 12/31/2020 12:38    Scheduled Meds: . amiodarone  200 mg Oral Daily  .  apixaban  5 mg Oral BID  . vitamin C  500 mg Oral Daily  . cholecalciferol  1,000 Units Oral Daily  . diphenhydrAMINE  12.5 mg Oral Once  . doxycycline  100 mg Oral BID  . famotidine  20 mg Oral BID  . finasteride  5 mg Oral Daily  . furosemide  40 mg Oral Daily  . guaiFENesin  600 mg Oral BID  . insulin aspart  0-15 Units Subcutaneous TID WC  . insulin aspart  0-5 Units Subcutaneous QHS  . Ipratropium-Albuterol  1 puff Inhalation Q6H  . levothyroxine  100 mcg Oral Daily  . losartan  100 mg Oral Daily  . mouth rinse  15 mL Mouth Rinse BID  . methylPREDNISolone (SOLU-MEDROL) injection  40 mg Intravenous Q12H  . potassium chloride  40 mEq Oral Once  . spironolactone  12.5 mg Oral Daily  . tamsulosin  0.8 mg Oral Daily  . traZODone  50 mg Oral QHS  . zinc sulfate  220 mg Oral Daily   Continuous Infusions: . sodium chloride Stopped (12/30/20 2116)  . azithromycin    . cefTRIAXone (ROCEPHIN)  IV    . tocilizumab (ACTEMRA) - non-COVID treatment       LOS: 4 days   Time spent: 40 minutes.  Lorella Nimrod, MD [facility]  If 7PM-7AM, please contact night-coverage [email or web address]  01/01/2021, 8:48 AM   This record has been created using Systems analyst. Errors have been sought and corrected,but may not always be located. Such creation errors do  not reflect on the standard of care.

## 2021-01-02 DIAGNOSIS — J1282 Pneumonia due to coronavirus disease 2019: Secondary | ICD-10-CM | POA: Diagnosis not present

## 2021-01-02 DIAGNOSIS — J9601 Acute respiratory failure with hypoxia: Secondary | ICD-10-CM | POA: Diagnosis not present

## 2021-01-02 DIAGNOSIS — U071 COVID-19: Secondary | ICD-10-CM | POA: Diagnosis not present

## 2021-01-02 LAB — COMPREHENSIVE METABOLIC PANEL
ALT: 43 U/L (ref 0–44)
AST: 30 U/L (ref 15–41)
Albumin: 2.6 g/dL — ABNORMAL LOW (ref 3.5–5.0)
Alkaline Phosphatase: 89 U/L (ref 38–126)
Anion gap: 8 (ref 5–15)
BUN: 31 mg/dL — ABNORMAL HIGH (ref 8–23)
CO2: 30 mmol/L (ref 22–32)
Calcium: 8.2 mg/dL — ABNORMAL LOW (ref 8.9–10.3)
Chloride: 104 mmol/L (ref 98–111)
Creatinine, Ser: 0.84 mg/dL (ref 0.61–1.24)
GFR, Estimated: >60 mL/min (ref >60)
Glucose, Bld: 141 mg/dL — ABNORMAL HIGH (ref 70–99)
Potassium: 4.4 mmol/L (ref 3.5–5.1)
Sodium: 142 mmol/L (ref 135–145)
Total Bilirubin: 0.6 mg/dL (ref 0.3–1.2)
Total Protein: 5.3 g/dL — ABNORMAL LOW (ref 6.5–8.1)

## 2021-01-02 LAB — CBC WITH DIFFERENTIAL/PLATELET
Abs Immature Granulocytes: 1.24 10*3/uL — ABNORMAL HIGH (ref 0.00–0.07)
Basophils Absolute: 0.2 10*3/uL — ABNORMAL HIGH (ref 0.0–0.1)
Basophils Relative: 1 %
Eosinophils Absolute: 0 10*3/uL (ref 0.0–0.5)
Eosinophils Relative: 0 %
HCT: 38.5 % — ABNORMAL LOW (ref 39.0–52.0)
Hemoglobin: 12.4 g/dL — ABNORMAL LOW (ref 13.0–17.0)
Immature Granulocytes: 5 %
Lymphocytes Relative: 2 %
Lymphs Abs: 0.5 10*3/uL — ABNORMAL LOW (ref 0.7–4.0)
MCH: 30 pg (ref 26.0–34.0)
MCHC: 32.2 g/dL (ref 30.0–36.0)
MCV: 93 fL (ref 80.0–100.0)
Monocytes Absolute: 0.6 10*3/uL (ref 0.1–1.0)
Monocytes Relative: 2 %
Neutro Abs: 21.6 10*3/uL — ABNORMAL HIGH (ref 1.7–7.7)
Neutrophils Relative %: 90 %
Platelets: 371 10*3/uL (ref 150–400)
RBC: 4.14 MIL/uL — ABNORMAL LOW (ref 4.22–5.81)
RDW: 13.3 % (ref 11.5–15.5)
WBC: 24.1 10*3/uL — ABNORMAL HIGH (ref 4.0–10.5)
nRBC: 0 % (ref 0.0–0.2)

## 2021-01-02 LAB — C-REACTIVE PROTEIN: CRP: 2.1 mg/dL — ABNORMAL HIGH (ref <1.0)

## 2021-01-02 LAB — GLUCOSE, CAPILLARY
Glucose-Capillary: 174 mg/dL — ABNORMAL HIGH (ref 70–99)
Glucose-Capillary: 275 mg/dL — ABNORMAL HIGH (ref 70–99)
Glucose-Capillary: 212 mg/dL — ABNORMAL HIGH (ref 70–99)
Glucose-Capillary: 143 mg/dL — ABNORMAL HIGH (ref 70–99)

## 2021-01-02 LAB — CULTURE, RESPIRATORY W GRAM STAIN: Culture: NORMAL

## 2021-01-02 LAB — FERRITIN: Ferritin: 430 ng/mL — ABNORMAL HIGH (ref 24–336)

## 2021-01-02 LAB — FIBRIN DERIVATIVES D-DIMER (ARMC ONLY): Fibrin derivatives D-dimer (ARMC): 6867.55 ng/mL (FEU) — ABNORMAL HIGH (ref 0.00–499.00)

## 2021-01-02 MED ORDER — FUROSEMIDE 10 MG/ML IJ SOLN
40.0000 mg | Freq: Every day | INTRAMUSCULAR | Status: DC
  Administered 2021-01-02 – 2021-01-03 (×2): 40 mg via INTRAVENOUS
  Filled 2021-01-02 (×2): qty 4

## 2021-01-02 MED ORDER — SPIRONOLACTONE 25 MG PO TABS
25.0000 mg | ORAL_TABLET | Freq: Two times a day (BID) | ORAL | Status: DC
  Administered 2021-01-02 – 2021-01-06 (×9): 25 mg via ORAL
  Filled 2021-01-02 (×12): qty 1

## 2021-01-02 MED ORDER — ALBUMIN HUMAN 25 % IV SOLN
12.5000 g | Freq: Every day | INTRAVENOUS | Status: AC
  Administered 2021-01-02 – 2021-01-05 (×4): 12.5 g via INTRAVENOUS
  Filled 2021-01-02 (×4): qty 50

## 2021-01-02 MED ORDER — INSULIN GLARGINE 100 UNIT/ML ~~LOC~~ SOLN
10.0000 [IU] | Freq: Two times a day (BID) | SUBCUTANEOUS | Status: DC
  Administered 2021-01-02 – 2021-01-13 (×23): 10 [IU] via SUBCUTANEOUS
  Filled 2021-01-02 (×27): qty 0.1

## 2021-01-02 MED ORDER — METHYLPREDNISOLONE SODIUM SUCC 125 MG IJ SOLR
60.0000 mg | Freq: Two times a day (BID) | INTRAMUSCULAR | Status: DC
  Administered 2021-01-02 – 2021-01-07 (×11): 60 mg via INTRAVENOUS
  Filled 2021-01-02 (×11): qty 2

## 2021-01-02 NOTE — Progress Notes (Signed)
PROGRESS NOTE    Nathaniel Ibarra  ENI:[id number] DOB: 27-Jan-1941 DOA: 12/28/2020 PCP: [name], MD   Brief Narrative: Taken from H&P Nathaniel Ibarra  is a 80 y.o. Caucasian male with a known history of hypertension, coronary artery disease, status post PCI and stent, paroxysmal atrial fibrillation, CHF, BPH and hypothyroidism, who presented to the emergency room with acute onset of worsening cough with dyspnea.  His symptoms started about a week and a half ago with sore throat followed by dry cough occasionally productive of whitish and greenish sputum.  He denies any fever chills.  He admitted to significant diminished sense of taste and smell.  He has been having fatigue and tiredness.  He denied any loss of appetite.  No nausea or vomiting or diarrhea.  He has been exposed to a friend with COVID prior to his symptoms.  His grandson and his wife tested positive yesterday for COVID-19 and their symptoms started 5 days ago. COVID-19 PCR was positive, chest x-ray with bilateral infiltrate consistent with atypical pneumonia/pulmonary edema. He was hypoxic in high 80s requiring 2 to 4 L of oxygen initially currently on 6 L. He was started on remdesivir, steroid and baricitinib. Baricitinib discontinued due to worsening leukocytosis.  patient is vaccinated with 2 doses and no booster. Patient desaturated later in the day requiring nonrebreather, 1 dose of Actemra given.  CTA ordered for today although he is on Eliquis, which was negative for PE although films were little motion degraded. Worsening oxygen requirement today-transitioning to heated high flow. Received 2 doses of Actemra. He was started on antibiotics for worsening leukocytosis. Remained on maximum setting of heated high flow today. Complaining of hemoptysis, discontinuing Eliquis and a pulmonary consult, concern of alveolar hemorrhage.  Subjective: Patient was complaining of spitting out blood with cough, he had a cup next  to his chair with red color sputum.  No frank bleeding. He was sitting in chair and able to speak full sentences but barely making in low 90s on 50 L with 100% FiO2.  Assessment & Plan:   Active Problems:   Pneumonia due to COVID-19 virus  Acute hypoxic respiratory failure secondary to COVID-19 pneumonia. Patient with neutrophilic predominant leukocytosis, procalcitonin remain negative, markedly elevated CRP, D-dimer trending up, 1 dose of Actemra given yesterday due to worsening oxygen requirement. -Get CTA-patient was already on Eliquis, it was negative for PE but films were little motion degraded.  Completed the course of remdesivir and received 2 doses of Actemra. 1/14.  Patient has worsening oxygen requirement today, CTA with extensive bilateral infiltrate,  Worsening leukocytosis with left shift.  Worsening inflammatory markers.  MRSA PCR negative. 1/15.  Patient with new onset hemoptysis, he was on Eliquis, worsening oxygen requirement and inflammatory markers.  Hemoglobin stable, no frank bleeding but quite a bit of blood-tinged sputum in a cup. -Pulmonary consult. -Discontinue Eliquis  -Continue ceftriaxone and Zithromax. -Continue with steroid-day 7 -Continue with lasix -Continue to monitor inflammatory markers. -Continue with supplemental oxygen to keep the saturation above 90%. -Continue with supportive care and supplements. -Proning if tolerated. -Chest PT  Paroxysmal atrial fibrillation.  Currently rate well controlled.  EKG ordered but has not done yet. -Continue with amiodarone. -Discontinuing Eliquis today due to concern of hemoptysis.  Hypertension.  Blood pressure remained elevated. -Continue with home dose of spironolactone. -Continue with increased dose of Cozaar. -Hydralazine 25 mg every 8 hourly as needed.  Hypothyroidism. -Continue with home dose of Synthroid.  Hyperglycemia.  No prior diagnosis of  diabetes, most likely steroid-induced. -Check A1c- 5.5  he is not diabetic -Continue SSI as needed for steroid-induced hyperglycemia. -Add 10 units of Lantus twice daily.  BPH. -Continue home dose of Flomax and Proscar  Depression.  Paxil was listed in his chart but apparently patient stopped taking it. -Discontinue Paxil and monitor.  Objective: Vitals:   01/01/21 2011 01/02/21 0513 01/02/21 0615 01/02/21 0800  BP: (!) 189/80 (!) 189/73  (!) 170/75  Pulse: 60 (!) 58  61  Resp:    (!) 41  Temp: 98.3 F (36.8 C) 97.6 F (36.4 C)  97.7 F (36.5 C)  TempSrc: Oral Oral  Oral  SpO2: 96% 93%  90%  Weight:   84.1 kg   Height:        Intake/Output Summary (Last 24 hours) at 01/02/2021 0833 Last data filed at 01/02/2021 0615 Gross per 24 hour  Intake 1075 ml  Output 1075 ml  Net 0 ml   Filed Weights   12/28/20 1143 01/02/21 0615  Weight: 88.5 kg 84.1 kg    Examination:  General.  Well-developed elderly man, sitting in chair, in no acute distress. Pulmonary.  Bilateral crackles, normal respiratory effort. CV.  Regular rate and rhythm, no JVD, rub or murmur. Abdomen.  Soft, nontender, nondistended, BS positive. CNS.  Alert and oriented x3.  No focal neurologic deficit. Extremities.  No edema, no cyanosis, pulses intact and symmetrical. Psychiatry.  Judgment and insight appears normal.  DVT prophylaxis: SCDs Code Status: Full Family Communication: Talked with wife on phone. Disposition Plan:  Status is: Inpatient  Remains inpatient appropriate because:Inpatient level of care appropriate due to severity of illness   Dispo: The patient is from: Home              Anticipated d/c is to: Home              Anticipated d/c date is: 1-2 days.               Patient currently is not medically stable to d/c.   Consultants:   Pulmonology  Procedures:  Antimicrobials:  Ceftriaxone Zithromax  Data Reviewed: I have personally reviewed following labs and imaging studies  CBC: Recent Labs  Lab 12/29/20 0345 12/30/20 0417  12/31/20 0352 01/01/21 0248 01/02/21 0509  WBC 12.8* 18.0* 17.9* 20.4* 24.1*  NEUTROABS 11.9* 16.4* 16.3* 18.4* 21.6*  HGB 12.6* 12.1* 12.1* 12.0* 12.4*  HCT 38.4* 35.8* 36.5* 35.5* 38.5*  MCV 93.7 91.6 91.5 91.7 93.0  PLT 305 382 424* [phone] [id number]   Basic Metabolic Panel: Recent Labs  Lab 12/28/20 2047 12/29/20 0345 12/30/20 0417 12/31/20 0352 01/01/21 0248 01/02/21 0509  NA  --  139 142 142 141 142  K  --  3.9 3.4* 4.0 3.4* 4.4  CL  --  102 105 104 104 104  CO2  --  25 27 30 29 30   GLUCOSE  --  215* 199* 188* 227* 141*  BUN  --  23 27* 29* 32* 31*  CREATININE  --  0.93 0.74 1.01 0.96 0.84  CALCIUM  --  8.6* 8.3* 8.4* 8.1* 8.2*  MG 2.2  --   --   --   --   --    GFR: Estimated Creatinine Clearance: 78.3 mL/min (by C-G formula based on SCr of 0.84 mg/dL). Liver Function Tests: Recent Labs  Lab 12/29/20 0345 12/30/20 0417 12/31/20 0352 01/01/21 0248 01/02/21 0509  AST 31 30 25 26 30   ALT 30 33 35 40  43  ALKPHOS 71 68 76 84 89  BILITOT 0.7 0.4 0.5 0.6 0.6  PROT 6.6 6.0* 5.8* 5.4* 5.3*  ALBUMIN 3.1* 2.7* 2.7* 2.5* 2.6*   No results for input(s): LIPASE, AMYLASE in the last 168 hours. No results for input(s): AMMONIA in the last 168 hours. Coagulation Profile: No results for input(s): INR, PROTIME in the last 168 hours. Cardiac Enzymes: No results for input(s): CKTOTAL, CKMB, CKMBINDEX, TROPONINI in the last 168 hours. BNP (last 3 results) No results for input(s): PROBNP in the last 8760 hours. HbA1C: No results for input(s): HGBA1C in the last 72 hours. CBG: Recent Labs  Lab 01/01/21 0915 01/01/21 1121 01/01/21 1639 01/01/21 2210 01/02/21 0808  GLUCAP 125* 241* 195* 203* 174*   Lipid Profile: No results for input(s): CHOL, HDL, LDLCALC, TRIG, CHOLHDL, LDLDIRECT in the last 72 hours. Thyroid Function Tests: No results for input(s): TSH, T4TOTAL, FREET4, T3FREE, THYROIDAB in the last 72 hours. Anemia Panel: Recent Labs    01/01/21 0248 01/02/21 0509   FERRITIN 440* 430*   Sepsis Labs: Recent Labs  Lab 12/28/20 1148 12/28/20 2047 12/29/20 0345 12/30/20 0417  PROCALCITON <0.10 <0.10 <0.10 <0.10    Recent Results (from the past 240 hour(s))  Culture, sputum-assessment     Status: None   Collection Time: 12/30/20  9:59 AM   Specimen: Sputum  Result Value Ref Range Status   Specimen Description SPUTUM  Final   Special Requests NONE  Final   Sputum evaluation   Final    THIS SPECIMEN IS ACCEPTABLE FOR SPUTUM CULTURE Performed at [facility], [address], [place], [place] [address]    Report Status 12/30/2020 FINAL  Final  Culture, respiratory     Status: None (Preliminary result)   Collection Time: 12/30/20  9:59 AM   Specimen: SPU  Result Value Ref Range Status   Specimen Description   Final    SPUTUM Performed at [facility], [address], [place], [place] [address]    Special Requests   Final    NONE Reflexed from [id number] Performed at [facility], [address] [place], [place] [address]    Gram Stain   Final    RARE WBC PRESENT, PREDOMINANTLY PMN FEW GRAM POSITIVE COCCI IN CHAINS RARE GRAM POSITIVE RODS    Culture   Final    CULTURE REINCUBATED FOR BETTER GROWTH Performed at [facility], [place] [address], [place], [place] [address]    Report Status PENDING  Incomplete  MRSA PCR Screening     Status: None   Collection Time: 01/01/21  8:47 AM   Specimen: Nasal Mucosa; Nasopharyngeal  Result Value Ref Range Status   MRSA by PCR NEGATIVE NEGATIVE Final    Comment:        The GeneXpert MRSA Assay (FDA approved for NASAL specimens only), is one component of a comprehensive MRSA colonization surveillance program. It is not intended to diagnose MRSA infection nor to guide or monitor treatment for MRSA infections. Performed at [facility], [address] [place], [place] [address]      Radiology Studies: CT ANGIO CHEST PE W OR WO CONTRAST  Result  Date: 12/31/2020 CLINICAL DATA:  COVID-19 positive, shortness of breath, positive D-dimer EXAM: CT ANGIOGRAPHY CHEST WITH CONTRAST TECHNIQUE: Multidetector CT imaging of the chest was performed using the standard protocol during bolus administration of intravenous contrast. Multiplanar CT image reconstructions and MIPs were obtained to evaluate the vascular anatomy. CONTRAST:  41mL  OMNIPAQUE IOHEXOL 350 MG/ML SOLN COMPARISON:  12/28/2020 FINDINGS: Cardiovascular: Satisfactory opacification of the pulmonary arteries. Evaluation of the segmental and subsegmental branches is degraded by excessive respiratory motion artifact. No filling defect within the central, main, or lobar branch pulmonary arteries. No evidence of right heart strain. Heart size is mildly enlarged. No pericardial effusion. Thoracic aorta is nonaneurysmal. Atherosclerotic calcification of the aorta and coronary arteries. Mediastinum/Nodes: No axillary, mediastinal, or hilar lymphadenopathy. Trachea and esophagus within normal limits. Lungs/Pleura: Extensive patchy ground-glass opacity throughout both lungs, right greater than left. No pleural effusion or pneumothorax. Upper Abdomen: No acute abnormality. Musculoskeletal: No chest wall abnormality. No acute or significant osseous findings. Review of the MIP images confirms the above findings. IMPRESSION: 1. Evaluation of the segmental and subsegmental branches is degraded by excessive respiratory motion artifact. No evidence of pulmonary embolism to the lobar branch level. 2. Extensive patchy ground-glass opacity throughout both lungs, right greater than left, consistent with multifocal pneumonia in the setting of known COVID-19 infection. Aortic Atherosclerosis (ICD10-I70.0). Electronically Signed   By: Davina Poke D.O.   On: 12/31/2020 12:38    Scheduled Meds: . amiodarone  200 mg Oral Daily  . apixaban  5 mg Oral BID  . vitamin C  500 mg Oral Daily  . cholecalciferol  1,000 Units Oral  Daily  . famotidine  20 mg Oral BID  . finasteride  5 mg Oral Daily  . furosemide  40 mg Oral Daily  . guaiFENesin  600 mg Oral BID  . insulin aspart  0-15 Units Subcutaneous TID WC  . insulin aspart  0-5 Units Subcutaneous QHS  . Ipratropium-Albuterol  1 puff Inhalation Q6H  . levothyroxine  100 mcg Oral Daily  . losartan  100 mg Oral Daily  . mouth rinse  15 mL Mouth Rinse BID  . methylPREDNISolone (SOLU-MEDROL) injection  40 mg Intravenous Q12H  . spironolactone  12.5 mg Oral Daily  . tamsulosin  0.8 mg Oral Daily  . traZODone  50 mg Oral QHS  . zinc sulfate  220 mg Oral Daily   Continuous Infusions: . sodium chloride Stopped (12/30/20 2116)  . azithromycin Stopped (01/01/21 1136)  . cefTRIAXone (ROCEPHIN)  IV Stopped (01/01/21 1052)     LOS: 5 days   Time spent: 40 minutes.  Lorella Nimrod, MD [facility]  If 7PM-7AM, please contact night-coverage [email or web address]  01/02/2021, 8:33 AM   This record has been created using Systems analyst. Errors have been sought and corrected,but may not always be located. Such creation errors do not reflect on the standard of care.

## 2021-01-02 NOTE — Consult Note (Signed)
Pulmonary Medicine          Date: 01/02/2021,   MRN# [id number] Nathaniel Ibarra 1941-11-09     AdmissionWeight: 88.5 kg                 CurrentWeight: 84.1 kg   Referring physician: Dr Reesa Chew   CHIEF COMPLAINT:   Acute hypoxemic respiratory failure due to [place]   As per admission h/p Nathaniel Ibarra  is a 80 y.o. Caucasian male with a known history of hypertension, coronary artery disease, status post PCI and stent, paroxysmal atrial fibrillation, CHF, BPH and hypothyroidism, who presented to the emergency room with acute onset of worsening cough with dyspnea.  His symptoms started about a week and a half ago with sore throat followed by dry cough occasionally productive of whitish and greenish sputum.  He denies any fever chills.  He admitted to significant diminished sense of taste and smell.  He has been having fatigue and tiredness.  He denied any loss of appetite.  No nausea or vomiting or diarrhea.  He has been exposed to a friend with COVID prior to his symptoms.  His grandson and his wife tested positive for COVID-19 and their symptoms started 5 days ago.  He denies any chest pain or palpitations.  No dysuria, oliguria or hematuria or flank pain.  Upon presentation to the emergency room, blood pressure was 147/45 temperature was 99.1.  Pulse oximetry was 86% on room air and 95% on 2 L of O2 by nasal cannula.  CBC showed leukocytosis of 12.2 and anemia with hemoglobin of 12.2 and hematocrit 37.8 close to previous levels a month ago.  COVID-19 PCR came back positive.  BMP showed borderline potassium of 3.5 and a creatinine of 1.21 with a BUN of 26.  Two-view chest x-ray showed multifocal interstitial and patchy airspace opacities with differential diagnosis including atypical pneumonia and pulmonary edema with interval clearing of prior left midlung opacity shown on 11/25/2020 and mild enlargement of the cardio-pericardial silhouette.  The patient  was given Tessalon Perles, 125 mg of IV Solu-Medrol and was ordered IV remdesivir.  He will be admitted to a medical monitored isolation bed for further evaluation and management.  Pulmonary consultation placed for additional evaluation and management of severe acute hypoxemic respiratory failure.    PAST MEDICAL HISTORY   Past Medical History:  Diagnosis Date  . Bladder cancer ([place])   . BPH (benign prostatic hyperplasia)   . CHF (congestive heart failure) ([place])   . GERD (gastroesophageal reflux disease)   . Hypertension   . Myocardial infarction [facility])    cath with stent in 2016  . Thyroid disease      SURGICAL HISTORY   Past Surgical History:  Procedure Laterality Date  . CARDIAC CATHETERIZATION N/A 09/01/2015   Procedure: Left Heart Cath and Coronary Angiography;  Surgeon: Dionisio David, MD;  Location: [place] [facility];  Service: Cardiovascular;  Laterality: N/A;  . CARDIAC CATHETERIZATION N/A 09/01/2015   Procedure: Coronary Stent Intervention;  Surgeon: Yolonda Kida, MD;  Location: [place] [facility];  Service: Cardiovascular;  Laterality: N/A;  . CATARACT EXTRACTION W/PHACO Left 11/23/2016   Procedure: CATARACT EXTRACTION PHACO AND INTRAOCULAR LENS PLACEMENT (IOC);  Surgeon: Leandrew Koyanagi, MD;  Location: [place];  Service: Ophthalmology;  Laterality: Left;  . CATARACT EXTRACTION W/PHACO Right 02/01/2017   Procedure: CATARACT EXTRACTION PHACO AND INTRAOCULAR LENS PLACEMENT (IOC) Complicated  right toric lens;  Surgeon: Leandrew Koyanagi, MD;  Location: [place];  Service: Ophthalmology;  Laterality: Right;  [place]alyugin toric Lens  . LEFT HEART CATH AND CORONARY ANGIOGRAPHY Right 11/14/2017   Procedure: LEFT HEART CATH AND CORONARY ANGIOGRAPHY;  Surgeon: Dionisio David, MD;  Location: [place] [facility];  Service: Cardiovascular;  Laterality: Right;  . SCROTAL EXPLORATION Left 11/24/2020   Procedure: SCROTUM EXPLORATION WITH LEFT ORCHIECTOMY;   Surgeon: Robley Fries, MD;  Location: [facility];  Service: Urology;  Laterality: Left;     FAMILY HISTORY   Family History  Problem Relation Age of Onset  . Emphysema Mother   . Emphysema Father      SOCIAL HISTORY   Social History   Tobacco Use  . Smoking status: Never Smoker  . Smokeless tobacco: Never Used  Substance Use Topics  . Alcohol use: No  . Drug use: No     MEDICATIONS    Home Medication:    Current Medication:  Current Facility-Administered Medications:  .  0.9 %  sodium chloride infusion, , Intravenous, PRN, Lorella Nimrod, MD, Stopped at 12/30/20 2116 .  acetaminophen (TYLENOL) tablet 650 mg, 650 mg, Oral, Q6H PRN, Mansy, Jan A, MD, 650 mg at 12/31/20 0539 .  amiodarone (PACERONE) tablet 200 mg, 200 mg, Oral, Daily, Mansy, Jan A, MD, 200 mg at 01/02/21 0914 .  apixaban (ELIQUIS) tablet 5 mg, 5 mg, Oral, BID, Mansy, Jan A, MD, 5 mg at 01/02/21 0913 .  ascorbic acid (VITAMIN C) tablet 500 mg, 500 mg, Oral, Daily, Mansy, Jan A, MD, 500 mg at 01/02/21 0914 .  azithromycin (ZITHROMAX) 500 mg in sodium chloride 0.9 % 250 mL IVPB, 500 mg, Intravenous, Q24H, Lorella Nimrod, MD, Stopped at 01/01/21 1136 .  cefTRIAXone (ROCEPHIN) 2 g in sodium chloride 0.9 % 100 mL IVPB, 2 g, Intravenous, Q24H, Amin, Soundra Pilon, MD, Last Rate: 200 mL/hr at 01/02/21 0925, 2 g at 01/02/21 0925 .  chlorpheniramine-HYDROcodone (TUSSIONEX) 10-8 MG/5ML suspension 5 mL, 5 mL, Oral, Q12H PRN, Mansy, Jan A, MD, 5 mL at 01/01/21 2103 .  cholecalciferol (VITAMIN D3) tablet 1,000 Units, 1,000 Units, Oral, Daily, Mansy, Jan A, MD, 1,000 Units at 01/02/21 0915 .  famotidine (PEPCID) tablet 20 mg, 20 mg, Oral, BID, Mansy, Jan A, MD, 20 mg at 01/02/21 0915 .  finasteride (PROSCAR) tablet 5 mg, 5 mg, Oral, Daily, Mansy, Jan A, MD, 5 mg at 01/02/21 0914 .  furosemide (LASIX) injection 40 mg, 40 mg, Intravenous, Daily, Lorella Nimrod, MD, 40 mg at 01/02/21 0915 .  guaiFENesin (MUCINEX) 12 hr tablet 600  mg, 600 mg, Oral, BID, Mansy, Jan A, MD, 600 mg at 01/01/21 2104 .  guaiFENesin-dextromethorphan (ROBITUSSIN DM) 100-10 MG/5ML syrup 10 mL, 10 mL, Oral, Q4H PRN, Mansy, Jan A, MD, 10 mL at 12/31/20 2236 .  hydrALAZINE (APRESOLINE) tablet 25 mg, 25 mg, Oral, Q8H PRN, Lorella Nimrod, MD, 25 mg at 01/02/21 0554 .  insulin aspart (novoLOG) injection 0-15 Units, 0-15 Units, Subcutaneous, TID WC, Lorella Nimrod, MD, 3 Units at 01/02/21 0925 .  insulin aspart (novoLOG) injection 0-5 Units, 0-5 Units, Subcutaneous, QHS, Lorella Nimrod, MD, 2 Units at 01/01/21 2223 .  insulin glargine (LANTUS) injection 10 Units, 10 Units, Subcutaneous, BID, Lorella Nimrod, MD .  Ipratropium-Albuterol (COMBIVENT) respimat 1 puff, 1 puff, Inhalation, Q6H, Lorella Nimrod, MD, 1 puff at 01/02/21 0919 .  ketorolac (TORADOL) 15 MG/ML injection 15 mg, 15 mg, Intravenous, Q6H PRN, Mansy, Jan A, MD, 15 mg at 01/01/21 2220 .  levothyroxine (SYNTHROID) tablet 100 mcg, 100 mcg, Oral, Daily, Mansy, Jan A, MD, 100 mcg at 01/02/21 0915 .  losartan (COZAAR) tablet 100 mg, 100 mg, Oral, Daily, Lorella Nimrod, MD, 100 mg at 01/01/21 0753 .  magnesium hydroxide (MILK OF MAGNESIA) suspension 30 mL, 30 mL, Oral, Daily PRN, Mansy, Jan A, MD .  MEDLINE mouth rinse, 15 mL, Mouth Rinse, BID, Lorella Nimrod, MD, 15 mL at 01/02/21 0916 .  menthol-cetylpyridinium (CEPACOL) lozenge 3 mg, 1 lozenge, Oral, PRN, Lu Duffel, RPH .  methylPREDNISolone sodium succinate (SOLU-MEDROL) 125 mg/2 mL injection 60 mg, 60 mg, Intravenous, Q12H, Amin, Sumayya, MD .  ondansetron (ZOFRAN) tablet 4 mg, 4 mg, Oral, Q6H PRN **OR** ondansetron (ZOFRAN) injection 4 mg, 4 mg, Intravenous, Q6H PRN, Mansy, Jan A, MD .  polyethylene glycol (MIRALAX / GLYCOLAX) packet 17 g, 17 g, Oral, Daily PRN, Mansy, Jan A, MD .  spironolactone (ALDACTONE) tablet 12.5 mg, 12.5 mg, Oral, Daily, Mansy, Jan A, MD, 12.5 mg at 01/02/21 0914 .  tamsulosin (FLOMAX) capsule 0.8 mg, 0.8 mg, Oral,  Daily, Mansy, Jan A, MD, 0.8 mg at 01/02/21 0915 .  traZODone (DESYREL) tablet 50 mg, 50 mg, Oral, QHS, Mansy, Jan A, MD, 50 mg at 01/01/21 2104 .  zinc sulfate capsule 220 mg, 220 mg, Oral, Daily, Mansy, Jan A, MD, 220 mg at 01/02/21 [id number]    ALLERGIES   Patient has no known allergies.     REVIEW OF SYSTEMS    Review of Systems:  Gen:  Denies  fever, sweats, chills weigh loss  HEENT: Denies blurred vision, double vision, ear pain, eye pain, hearing loss, nose bleeds, sore throat Cardiac:  No dizziness, chest pain or heaviness, chest tightness,edema Resp:   Denies cough or sputum porduction, shortness of breath,wheezing, hemoptysis,  Gi: Denies swallowing difficulty, stomach pain, nausea or vomiting, diarrhea, constipation, bowel incontinence Gu:  Denies bladder incontinence, burning urine Ext:   Denies Joint pain, stiffness or swelling Skin: Denies  skin rash, easy bruising or bleeding or hives Endoc:  Denies polyuria, polydipsia , polyphagia or weight change Psych:   Denies depression, insomnia or hallucinations   Other:  All other systems negative   VS: BP (!) 170/75 (BP Location: Right Arm)   Pulse 61   Temp 97.7 F (36.5 C) (Oral)   Resp 20   Ht 6' (1.829 m)   Wt 84.1 kg   SpO2 90%   BMI 25.16 kg/m      PHYSICAL EXAM    GENERAL:NAD, no fevers, chills, no weakness no fatigue HEAD: Normocephalic, atraumatic.  EYES: Pupils equal, round, reactive to light. Extraocular muscles intact. No scleral icterus.  MOUTH: Moist mucosal membrane. Dentition intact. No abscess noted.  EAR, NOSE, THROAT: Clear without exudates. No external lesions.  NECK: Supple. No thyromegaly. No nodules. No JVD.  PULMONARY: rhonchi bilaterally  CARDIOVASCULAR: S1 and S2. Regular rate and rhythm. No murmurs, rubs, or gallops. No edema. Pedal pulses 2+ bilaterally.  GASTROINTESTINAL: Soft, nontender, nondistended. No masses. Positive bowel sounds. No hepatosplenomegaly.  MUSCULOSKELETAL: No  swelling, clubbing, or edema. Range of motion full in all extremities.  NEUROLOGIC: Cranial nerves II through XII are intact. No gross focal neurological deficits. Sensation intact. Reflexes intact.  SKIN: No ulceration, lesions, rashes, or cyanosis. Skin warm and dry. Turgor intact.  PSYCHIATRIC: Mood, affect within normal limits. The patient is awake, alert and oriented x 3. Insight, judgment intact.       IMAGING    DG Chest 2  View  Result Date: 12/28/2020 CLINICAL DATA:  Cough, hypoxia, shortness of breath. Congestion. The patient states that family members tested positive for COVID yesterday. EXAM: CHEST - 2 VIEW COMPARISON:  11/25/2020 FINDINGS: Interval improvement of previous hazy opacity over the left mid chest. Continued indistinct airspace opacities at the right lung base and along the minor fissure. Interstitial accentuation at the lung bases. Mild interstitial accentuation in the right mid lung. Mild enlargement of the cardiopericardial silhouette . No blunting of the costophrenic angles. IMPRESSION: 1. Multifocal interstitial and patchy airspace opacities. Differential diagnostic considerations include pulmonary edema or atypical pneumonia. 2. Interval clearing of prior left mid lung opacity shown on 11/25/2020. 3. Mild enlargement of the cardiopericardial silhouette Electronically Signed   By: Van Clines M.D.   On: 12/28/2020 13:13   CT ANGIO CHEST PE W OR WO CONTRAST  Result Date: 12/31/2020 CLINICAL DATA:  COVID-19 positive, shortness of breath, positive D-dimer EXAM: CT ANGIOGRAPHY CHEST WITH CONTRAST TECHNIQUE: Multidetector CT imaging of the chest was performed using the standard protocol during bolus administration of intravenous contrast. Multiplanar CT image reconstructions and MIPs were obtained to evaluate the vascular anatomy. CONTRAST:  75mL OMNIPAQUE IOHEXOL 350 MG/ML SOLN COMPARISON:  12/28/2020 FINDINGS: Cardiovascular: Satisfactory opacification of the  pulmonary arteries. Evaluation of the segmental and subsegmental branches is degraded by excessive respiratory motion artifact. No filling defect within the central, main, or lobar branch pulmonary arteries. No evidence of right heart strain. Heart size is mildly enlarged. No pericardial effusion. Thoracic aorta is nonaneurysmal. Atherosclerotic calcification of the aorta and coronary arteries. Mediastinum/Nodes: No axillary, mediastinal, or hilar lymphadenopathy. Trachea and esophagus within normal limits. Lungs/Pleura: Extensive patchy ground-glass opacity throughout both lungs, right greater than left. No pleural effusion or pneumothorax. Upper Abdomen: No acute abnormality. Musculoskeletal: No chest wall abnormality. No acute or significant osseous findings. Review of the MIP images confirms the above findings. IMPRESSION: 1. Evaluation of the segmental and subsegmental branches is degraded by excessive respiratory motion artifact. No evidence of pulmonary embolism to the lobar branch level. 2. Extensive patchy ground-glass opacity throughout both lungs, right greater than left, consistent with multifocal pneumonia in the setting of known COVID-19 infection. Aortic Atherosclerosis (ICD10-I70.0). Electronically Signed   By: Davina Poke D.O.   On: 12/31/2020 12:38      ASSESSMENT/PLAN    Acute COVID19 pneumonia with severe ARDS -Remdesevir antiviral - pharmacy protocol 5 d -vitamin C -zinc -solumedrol 60 bid  -Diuresis - Lasix 40 IV daily , aldactone increased to 25 bid , dcd hydralazine and losartan to allow higher BP with diuresis - monitor UOP - utilize external urinary catheter if possible -albumin 25% 1 amp daily x4days  -Self prone if patient can tolerate  -encourage to use IS and Acapella device for bronchopulmonary hygiene when able -d/c hepatotoxic medications while on remdesevir -supportive care with ICU telemetry monitoring -PT/OT ordered -procalcitonin, CRP and ferritin  trending        Thank you for allowing me to participate in the care of this patient.   Patient/Family are satisfied with care plan and all questions have been answered.  This document was prepared using Dragon voice recognition software and may include unintentional dictation errors.     Ottie Glazier, M.D.  Division of [place]

## 2021-01-03 DIAGNOSIS — U071 COVID-19: Secondary | ICD-10-CM | POA: Diagnosis not present

## 2021-01-03 DIAGNOSIS — J1282 Pneumonia due to coronavirus disease 2019: Secondary | ICD-10-CM | POA: Diagnosis not present

## 2021-01-03 DIAGNOSIS — J9601 Acute respiratory failure with hypoxia: Secondary | ICD-10-CM | POA: Diagnosis not present

## 2021-01-03 LAB — GLUCOSE, CAPILLARY
Glucose-Capillary: 153 mg/dL — ABNORMAL HIGH (ref 70–99)
Glucose-Capillary: 286 mg/dL — ABNORMAL HIGH (ref 70–99)
Glucose-Capillary: 181 mg/dL — ABNORMAL HIGH (ref 70–99)
Glucose-Capillary: 167 mg/dL — ABNORMAL HIGH (ref 70–99)

## 2021-01-03 LAB — COMPREHENSIVE METABOLIC PANEL
ALT: 40 U/L (ref 0–44)
AST: 24 U/L (ref 15–41)
Albumin: 2.9 g/dL — ABNORMAL LOW (ref 3.5–5.0)
Alkaline Phosphatase: 106 U/L (ref 38–126)
Anion gap: 9 (ref 5–15)
BUN: 32 mg/dL — ABNORMAL HIGH (ref 8–23)
CO2: 30 mmol/L (ref 22–32)
Calcium: 8.5 mg/dL — ABNORMAL LOW (ref 8.9–10.3)
Chloride: 103 mmol/L (ref 98–111)
Creatinine, Ser: 0.8 mg/dL (ref 0.61–1.24)
GFR, Estimated: >60 mL/min (ref >60)
Glucose, Bld: 223 mg/dL — ABNORMAL HIGH (ref 70–99)
Potassium: 4.3 mmol/L (ref 3.5–5.1)
Sodium: 142 mmol/L (ref 135–145)
Total Bilirubin: 0.6 mg/dL (ref 0.3–1.2)
Total Protein: 5.4 g/dL — ABNORMAL LOW (ref 6.5–8.1)

## 2021-01-03 LAB — CBC
HCT: 39.2 % (ref 39.0–52.0)
Hemoglobin: 13.2 g/dL (ref 13.0–17.0)
MCH: 31.1 pg (ref 26.0–34.0)
MCHC: 33.7 g/dL (ref 30.0–36.0)
MCV: 92.2 fL (ref 80.0–100.0)
Platelets: 374 10*3/uL (ref 150–400)
RBC: 4.25 MIL/uL (ref 4.22–5.81)
RDW: 13.4 % (ref 11.5–15.5)
WBC: 27.8 10*3/uL — ABNORMAL HIGH (ref 4.0–10.5)
nRBC: 0 % (ref 0.0–0.2)

## 2021-01-03 LAB — PROTIME-INR
INR: 1.3 — ABNORMAL HIGH (ref 0.8–1.2)
Prothrombin Time: 15.5 seconds — ABNORMAL HIGH (ref 11.4–15.2)

## 2021-01-03 LAB — TROPONIN I (HIGH SENSITIVITY)
Troponin I (High Sensitivity): 25 ng/L — ABNORMAL HIGH (ref <18)
Troponin I (High Sensitivity): 25 ng/L — ABNORMAL HIGH (ref <18)

## 2021-01-03 MED ORDER — CHLORHEXIDINE GLUCONATE CLOTH 2 % EX PADS
6.0000 | MEDICATED_PAD | Freq: Every day | CUTANEOUS | Status: DC
  Administered 2021-01-03 – 2021-01-06 (×4): 6 via TOPICAL

## 2021-01-03 MED ORDER — HYDRALAZINE HCL 25 MG PO TABS
25.0000 mg | ORAL_TABLET | Freq: Three times a day (TID) | ORAL | Status: DC | PRN
  Administered 2021-01-03: 25 mg via ORAL
  Filled 2021-01-03: qty 1

## 2021-01-03 MED ORDER — FUROSEMIDE 10 MG/ML IJ SOLN
60.0000 mg | Freq: Once | INTRAMUSCULAR | Status: AC
  Administered 2021-01-03: 60 mg via INTRAVENOUS

## 2021-01-03 MED ORDER — FUROSEMIDE 10 MG/ML IJ SOLN
INTRAMUSCULAR | Status: AC
  Filled 2021-01-03: qty 8

## 2021-01-03 NOTE — Progress Notes (Signed)
PROGRESS NOTE    Nathaniel Ibarra  [id number] DOB: Oct 12, 1941 DOA: 12/28/2020 PCP: [name], MD   Brief Narrative: Taken from H&P Nathaniel Ibarra  is a 80 y.o. Caucasian male with a known history of hypertension, coronary artery disease, status post PCI and stent, paroxysmal atrial fibrillation, CHF, BPH and hypothyroidism, who presented to the emergency room with acute onset of worsening cough with dyspnea.  His symptoms started about a week and a half ago with sore throat followed by dry cough occasionally productive of whitish and greenish sputum.  He denies any fever chills.  He admitted to significant diminished sense of taste and smell.  He has been having fatigue and tiredness.  He denied any loss of appetite.  No nausea or vomiting or diarrhea.  He has been exposed to a friend with COVID prior to his symptoms.  His grandson and his wife tested positive yesterday for COVID-19 and their symptoms started 5 days ago. COVID-19 PCR was positive, chest x-ray with bilateral infiltrate consistent with atypical pneumonia/pulmonary edema. He was hypoxic in high 80s requiring 2 to 4 L of oxygen initially currently on 6 L. He was started on remdesivir, steroid and baricitinib. Baricitinib discontinued due to worsening leukocytosis.  patient is vaccinated with 2 doses and no booster. Patient desaturated later in the day requiring nonrebreather, 1 dose of Actemra given.  CTA ordered for today although he is on Eliquis, which was negative for PE although films were little motion degraded. Worsening oxygen requirement today-transitioning to heated high flow. Received 2 doses of Actemra. He was started on antibiotics for worsening leukocytosis. Remained on maximum setting of heated high flow today. Complaining of hemoptysis, discontinuing Eliquis and a pulmonary consult, concern of alveolar hemorrhage. Patient with worsening hypoxia, hemoptysis seems improving, transferred to  stepdown.  Subjective: Overnight worsening hypoxia requiring nonrebreather with maximum setting of heated high flow, some nursing concern that while sleeping he was coming off of the oxygen.  He did received an extra dose of Lasix overnight.  Patient was desaturating in 50s this morning while tech was helping him with bed change and they removed all the oxygen during that process. Slowly improved to low to mid 80s after putting back heated high flow and nonrebreather.  Appears little short of breath, still able to speak sentences. Feeling weak.  Continue to have some streaking of blood in sputum but stating that it is much improved as compared to yesterday.  Assessment & Plan:   Active Problems:   Pneumonia due to COVID-19 virus  Acute hypoxic respiratory failure secondary to COVID-19 pneumonia. Patient with neutrophilic predominant leukocytosis, procalcitonin remain negative, markedly elevated CRP, D-dimer trending up, 1 dose of Actemra given yesterday due to worsening oxygen requirement. -Get CTA-patient was already on Eliquis, it was negative for PE but films were little motion degraded.  Completed the course of remdesivir and received 2 doses of Actemra. 1/14.  Patient has worsening oxygen requirement today, CTA with extensive bilateral infiltrate,  Worsening leukocytosis with left shift.  Worsening inflammatory markers.  MRSA PCR negative. 1/15.  Patient with new onset hemoptysis, he was on Eliquis, worsening oxygen requirement and inflammatory markers.  Hemoglobin stable, no frank bleeding but quite a bit of blood-tinged sputum in a cup. 1/16.  Worsening hypoxia-transferring to stepdown as he might need BiPAP or higher level of care.  High risk for more hemoptysis and PE. -Pulmonary consult-appreciate their help. -Keep holding Eliquis.  -Continue ceftriaxone and Zithromax. -Continue with steroid-day 7 -Continue  with lasix -Continue to monitor inflammatory markers. -Continue with  supplemental oxygen to keep the saturation above 90%. -Continue with supportive care and supplements. -Proning if tolerated. -Chest PT  Paroxysmal atrial fibrillation.  Currently rate well controlled.   -Continue with amiodarone. -Keep holding Eliquis due to concern of hemoptysis. -High risk for PE.  Hypertension.  Blood pressure remained elevated, spironolactone dose was increased yesterday. -Continue with  spironolactone. -Continue with increased dose of Cozaar. -Hydralazine 25 mg every 8 hourly as needed.  Hypothyroidism. -Continue with home dose of Synthroid.  Hyperglycemia.  No prior diagnosis of diabetes, most likely steroid-induced. -Check A1c- 5.5 he is not diabetic -Continue SSI as needed for steroid-induced hyperglycemia. -Continue 10 units of Lantus twice daily.  BPH. -Continue home dose of Flomax and Proscar  Depression.  Paxil was listed in his chart but apparently patient stopped taking it. -Discontinue Paxil and monitor.  Objective: Vitals:   01/03/21 0500 01/03/21 0756 01/03/21 0846 01/03/21 1127  BP:   (!) 151/67 (!) 149/68  Pulse:   65   Resp:  20 18 (!) 26  Temp:  97.6 F (36.4 C) 98 F (36.7 C) (!) 96 F (35.6 C)  TempSrc:  Oral Oral Axillary  SpO2: 93% 92% 92% (!) 83%  Weight: 84.4 kg   86.8 kg  Height:    6' (1.829 m)    Intake/Output Summary (Last 24 hours) at 01/03/2021 1255 Last data filed at 01/03/2021 0900 Gross per 24 hour  Intake 240 ml  Output 4600 ml  Net -4360 ml   Filed Weights   01/02/21 0615 01/03/21 0500 01/03/21 1127  Weight: 84.1 kg 84.4 kg 86.8 kg    Examination:  General.  Chronically ill-appearing elderly man, in no acute distress. Pulmonary.  Lungs clear bilaterally, normal respiratory effort. CV.  Regular rate and rhythm, no JVD, rub or murmur. Abdomen.  Soft, nontender, nondistended, BS positive. CNS.  Alert and oriented x3.  No focal neurologic deficit. Extremities.  No edema, no cyanosis, pulses intact and  symmetrical. Psychiatry.  Judgment and insight appears normal.  DVT prophylaxis: SCDs Code Status: Full Family Communication: Talked with wife on phone. Disposition Plan:  Status is: Inpatient  Remains inpatient appropriate because:Inpatient level of care appropriate due to severity of illness   Dispo: The patient is from: Home              Anticipated d/c is to: Home              Anticipated d/c date is: 3-4 days.               Patient currently is not medically stable to d/c.  Patient is critically ill, high risk for deterioration and death.   Consultants:   Pulmonology  Procedures:  Antimicrobials:  Ceftriaxone Zithromax  Data Reviewed: I have personally reviewed following labs and imaging studies  CBC: Recent Labs  Lab 12/29/20 0345 12/30/20 0417 12/31/20 0352 01/01/21 0248 01/02/21 0509 01/03/21 0407  WBC 12.8* 18.0* 17.9* 20.4* 24.1* 27.8*  NEUTROABS 11.9* 16.4* 16.3* 18.4* 21.6*  --   HGB 12.6* 12.1* 12.1* 12.0* 12.4* 13.2  HCT 38.4* 35.8* 36.5* 35.5* 38.5* 39.2  MCV 93.7 91.6 91.5 91.7 93.0 92.2  PLT 305 382 424* 387 371 761   Basic Metabolic Panel: Recent Labs  Lab 12/28/20 2047 12/29/20 0345 12/30/20 0417 12/31/20 0352 01/01/21 0248 01/02/21 0509 01/03/21 0407  NA  --    < > 142 142 141 142 142  K  --    < >  3.4* 4.0 3.4* 4.4 4.3  CL  --    < > 105 104 104 104 103  CO2  --    < > 27 30 29 30 30   GLUCOSE  --    < > 199* 188* 227* 141* 223*  BUN  --    < > 27* 29* 32* 31* 32*  CREATININE  --    < > 0.74 1.01 0.96 0.84 0.80  CALCIUM  --    < > 8.3* 8.4* 8.1* 8.2* 8.5*  MG 2.2  --   --   --   --   --   --    < > = values in this interval not displayed.   GFR: Estimated Creatinine Clearance: 82.2 mL/min (by C-G formula based on SCr of 0.8 mg/dL). Liver Function Tests: Recent Labs  Lab 12/30/20 0417 12/31/20 0352 01/01/21 0248 01/02/21 0509 01/03/21 0407  AST 30 25 26 30 24   ALT 33 35 40 43 40  ALKPHOS 68 76 84 89 106  BILITOT 0.4  0.5 0.6 0.6 0.6  PROT 6.0* 5.8* 5.4* 5.3* 5.4*  ALBUMIN 2.7* 2.7* 2.5* 2.6* 2.9*   No results for input(s): LIPASE, AMYLASE in the last 168 hours. No results for input(s): AMMONIA in the last 168 hours. Coagulation Profile: No results for input(s): INR, PROTIME in the last 168 hours. Cardiac Enzymes: No results for input(s): CKTOTAL, CKMB, CKMBINDEX, TROPONINI in the last 168 hours. BNP (last 3 results) No results for input(s): PROBNP in the last 8760 hours. HbA1C: No results for input(s): HGBA1C in the last 72 hours. CBG: Recent Labs  Lab 01/02/21 1230 01/02/21 1646 01/02/21 2117 01/03/21 0843 01/03/21 1154  GLUCAP 275* 212* 143* 153* 286*   Lipid Profile: No results for input(s): CHOL, HDL, LDLCALC, TRIG, CHOLHDL, LDLDIRECT in the last 72 hours. Thyroid Function Tests: No results for input(s): TSH, T4TOTAL, FREET4, T3FREE, THYROIDAB in the last 72 hours. Anemia Panel: Recent Labs    01/01/21 0248 01/02/21 0509  FERRITIN 440* 430*   Sepsis Labs: Recent Labs  Lab 12/28/20 1148 12/28/20 2047 12/29/20 0345 12/30/20 0417  PROCALCITON <0.10 <0.10 <0.10 <0.10    Recent Results (from the past 240 hour(s))  Culture, sputum-assessment     Status: None   Collection Time: 12/30/20  9:59 AM   Specimen: Sputum  Result Value Ref Range Status   Specimen Description SPUTUM  Final   Special Requests NONE  Final   Sputum evaluation   Final    THIS SPECIMEN IS ACCEPTABLE FOR SPUTUM CULTURE Performed at [facility], [address] [place], [place] [address]    Report Status 12/30/2020 FINAL  Final  Culture, respiratory     Status: None   Collection Time: 12/30/20  9:59 AM   Specimen: SPU  Result Value Ref Range Status   Specimen Description   Final    SPUTUM Performed at [facility], [address], [place], [place] [address]    Special Requests   Final    NONE Reflexed from [id number] Performed at [facility], [address]  [place], [place] [address]    Gram Stain   Final    RARE WBC PRESENT, PREDOMINANTLY PMN FEW GRAM POSITIVE COCCI IN CHAINS RARE GRAM POSITIVE RODS    Culture   Final    FEW Normal respiratory flora-no Staph aureus or Pseudomonas seen Performed at [facility], [address], [place], [place] [address]    Report Status 01/02/2021  FINAL  Final  MRSA PCR Screening     Status: None   Collection Time: 01/01/21  8:47 AM   Specimen: Nasal Mucosa; Nasopharyngeal  Result Value Ref Range Status   MRSA by PCR NEGATIVE NEGATIVE Final    Comment:        The GeneXpert MRSA Assay (FDA approved for NASAL specimens only), is one component of a comprehensive MRSA colonization surveillance program. It is not intended to diagnose MRSA infection nor to guide or monitor treatment for MRSA infections. Performed at [facility], [address] [place], [place] [address]      Radiology Studies: No results found.  Scheduled Meds: . amiodarone  200 mg Oral Daily  . vitamin C  500 mg Oral Daily  . Chlorhexidine Gluconate Cloth  6 each Topical Q2200  . cholecalciferol  1,000 Units Oral Daily  . famotidine  20 mg Oral BID  . finasteride  5 mg Oral Daily  . furosemide      . furosemide  40 mg Intravenous Daily  . guaiFENesin  600 mg Oral BID  . insulin aspart  0-15 Units Subcutaneous TID WC  . insulin aspart  0-5 Units Subcutaneous QHS  . insulin glargine  10 Units Subcutaneous BID  . Ipratropium-Albuterol  1 puff Inhalation Q6H  . levothyroxine  100 mcg Oral Daily  . mouth rinse  15 mL Mouth Rinse BID  . methylPREDNISolone (SOLU-MEDROL) injection  60 mg Intravenous Q12H  . spironolactone  25 mg Oral BID  . tamsulosin  0.8 mg Oral Daily  . traZODone  50 mg Oral QHS  . zinc sulfate  220 mg Oral Daily   Continuous Infusions: . sodium chloride Stopped (12/30/20 2116)  . albumin human 12.5 g (01/03/21 0850)  . azithromycin 500 mg (01/03/21 1013)  . cefTRIAXone (ROCEPHIN)  IV 2 g  (01/03/21 1011)     LOS: 6 days   Time spent: 40 minutes.  Lorella Nimrod, MD [facility]  If 7PM-7AM, please contact night-coverage [email or web address]  01/03/2021, 12:55 PM   This record has been created using Systems analyst. Errors have been sought and corrected,but may not always be located. Such creation errors do not reflect on the standard of care.

## 2021-01-03 NOTE — Progress Notes (Signed)
Critical Care Progress Note           Date: 01/03/2021,   MRN# [id number] Nathaniel Ibarra 1941/03/03     AdmissionWeight: 88.5 kg                 CurrentWeight: 86.8 kg   Referring physician: Dr Reesa Chew   CHIEF COMPLAINT:   Acute hypoxemic respiratory failure due to [place]   As per admission h/p Nathaniel Ibarra  is a 80 y.o. Caucasian male with a known history of hypertension, coronary artery disease, status post PCI and stent, paroxysmal atrial fibrillation, CHF, BPH and hypothyroidism, who presented to the emergency room with acute onset of worsening cough with dyspnea.  His symptoms started about a week and a half ago with sore throat followed by dry cough occasionally productive of whitish and greenish sputum.  He denies any fever chills.  He admitted to significant diminished sense of taste and smell.  He has been having fatigue and tiredness.  He denied any loss of appetite.  No nausea or vomiting or diarrhea.  He has been exposed to a friend with COVID prior to his symptoms.  His grandson and his wife tested positive for COVID-19 and their symptoms started 5 days ago.  He denies any chest pain or palpitations.  No dysuria, oliguria or hematuria or flank pain.  Upon presentation to the emergency room, blood pressure was 147/45 temperature was 99.1.  Pulse oximetry was 86% on room air and 95% on 2 L of O2 by nasal cannula.  CBC showed leukocytosis of 12.2 and anemia with hemoglobin of 12.2 and hematocrit 37.8 close to previous levels a month ago.  COVID-19 PCR came back positive.  BMP showed borderline potassium of 3.5 and a creatinine of 1.21 with a BUN of 26.  Two-view chest x-ray showed multifocal interstitial and patchy airspace opacities with differential diagnosis including atypical pneumonia and pulmonary edema with interval clearing of prior left midlung opacity shown on 11/25/2020 and mild enlargement of the cardio-pericardial silhouette.   The patient was given Tessalon Perles, 125 mg of IV Solu-Medrol and was ordered IV remdesivir.  He will be admitted to a medical monitored isolation bed for further evaluation and management.  Pulmonary consultation placed for additional evaluation and management of severe acute hypoxemic respiratory failure.    01/03/21-  Patient with episodes of non-massive hemoptysis.  Eliquis 2.5 BID was dcd, monitoring patient in SDU as he is high risk for both hemoptysis and PE.    PAST MEDICAL HISTORY   Past Medical History:  Diagnosis Date  . Bladder cancer ([place])   . BPH (benign prostatic hyperplasia)   . CHF (congestive heart failure) ([place])   . GERD (gastroesophageal reflux disease)   . Hypertension   . Myocardial infarction [facility])    cath with stent in 2016  . Thyroid disease      SURGICAL HISTORY   Past Surgical History:  Procedure Laterality Date  . CARDIAC CATHETERIZATION N/A 09/01/2015   Procedure: Left Heart Cath and Coronary Angiography;  Surgeon: Dionisio David, MD;  Location: [place] [facility];  Service: Cardiovascular;  Laterality: N/A;  . CARDIAC CATHETERIZATION N/A 09/01/2015   Procedure: Coronary Stent Intervention;  Surgeon: Yolonda Kida, MD;  Location: [place] [facility];  Service: Cardiovascular;  Laterality: N/A;  . CATARACT EXTRACTION W/PHACO Left 11/23/2016   Procedure: CATARACT EXTRACTION PHACO AND INTRAOCULAR LENS PLACEMENT (IOC);  Surgeon: Leandrew Koyanagi, MD;  Location: [place];  Service: Ophthalmology;  Laterality: Left;  . CATARACT EXTRACTION W/PHACO Right 02/01/2017   Procedure: CATARACT EXTRACTION PHACO AND INTRAOCULAR LENS PLACEMENT ([place]) Complicated  right toric lens;  Surgeon: Leandrew Koyanagi, MD;  Location: [place];  Service: Ophthalmology;  Laterality: Right;  Malyugin toric Lens  . LEFT HEART CATH AND CORONARY ANGIOGRAPHY Right 11/14/2017   Procedure: LEFT HEART CATH AND CORONARY ANGIOGRAPHY;  Surgeon: Dionisio David,  MD;  Location: [place] [facility];  Service: Cardiovascular;  Laterality: Right;  . SCROTAL EXPLORATION Left 11/24/2020   Procedure: SCROTUM EXPLORATION WITH LEFT ORCHIECTOMY;  Surgeon: Robley Fries, MD;  Location: [facility];  Service: Urology;  Laterality: Left;     FAMILY HISTORY   Family History  Problem Relation Age of Onset  . Emphysema Mother   . Emphysema Father      SOCIAL HISTORY   Social History   Tobacco Use  . Smoking status: Never Smoker  . Smokeless tobacco: Never Used  Substance Use Topics  . Alcohol use: No  . Drug use: No     MEDICATIONS    Home Medication:    Current Medication:  Current Facility-Administered Medications:  .  0.9 %  sodium chloride infusion, , Intravenous, PRN, Lorella Nimrod, MD, Stopped at 12/30/20 2116 .  acetaminophen (TYLENOL) tablet 650 mg, 650 mg, Oral, Q6H PRN, Mansy, Jan A, MD, 650 mg at 12/31/20 0539 .  albumin human 25 % solution 12.5 g, 12.5 g, Intravenous, Daily, Lanney Gins, Loic Hobin, MD, Last Rate: 60 mL/hr at 01/03/21 0850, 12.5 g at 01/03/21 0850 .  amiodarone (PACERONE) tablet 200 mg, 200 mg, Oral, Daily, Mansy, Jan A, MD, 200 mg at 01/03/21 0900 .  ascorbic acid (VITAMIN C) tablet 500 mg, 500 mg, Oral, Daily, Mansy, Jan A, MD, 500 mg at 01/03/21 0900 .  azithromycin (ZITHROMAX) 500 mg in sodium chloride 0.9 % 250 mL IVPB, 500 mg, Intravenous, Q24H, Amin, Sumayya, MD, Last Rate: 250 mL/hr at 01/03/21 1013, 500 mg at 01/03/21 1013 .  cefTRIAXone (ROCEPHIN) 2 g in sodium chloride 0.9 % 100 mL IVPB, 2 g, Intravenous, Q24H, Amin, Sumayya, MD, Last Rate: 200 mL/hr at 01/03/21 1011, 2 g at 01/03/21 1011 .  Chlorhexidine Gluconate Cloth 2 % PADS 6 each, 6 each, Topical, Q2200, Lorella Nimrod, MD .  chlorpheniramine-HYDROcodone (TUSSIONEX) 10-8 MG/5ML suspension 5 mL, 5 mL, Oral, Q12H PRN, Mansy, Jan A, MD, 5 mL at 01/02/21 2159 .  cholecalciferol (VITAMIN D3) tablet 1,000 Units, 1,000 Units, Oral, Daily, Mansy, Jan A, MD, 1,000  Units at 01/03/21 0900 .  famotidine (PEPCID) tablet 20 mg, 20 mg, Oral, BID, Mansy, Jan A, MD, 20 mg at 01/03/21 0900 .  finasteride (PROSCAR) tablet 5 mg, 5 mg, Oral, Daily, Mansy, Jan A, MD, 5 mg at 01/03/21 0900 .  furosemide (LASIX) 10 MG/ML injection, , , ,  .  furosemide (LASIX) injection 40 mg, 40 mg, Intravenous, Daily, Lorella Nimrod, MD, 40 mg at 01/03/21 0857 .  guaiFENesin (MUCINEX) 12 hr tablet 600 mg, 600 mg, Oral, BID, Mansy, Jan A, MD, 600 mg at 01/03/21 0900 .  guaiFENesin-dextromethorphan (ROBITUSSIN DM) 100-10 MG/5ML syrup 10 mL, 10 mL, Oral, Q4H PRN, Mansy, Jan A, MD, 10 mL at 12/31/20 2236 .  insulin aspart (novoLOG) injection 0-15 Units, 0-15 Units, Subcutaneous, TID WC, Lorella Nimrod, MD, 8 Units at 01/03/21 1157 .  insulin aspart (novoLOG) injection 0-5 Units, 0-5 Units, Subcutaneous, QHS, Lorella Nimrod, MD, 2 Units at 01/01/21  2223 .  insulin glargine (LANTUS) injection 10 Units, 10 Units, Subcutaneous, BID, Lorella Nimrod, MD, 10 Units at 01/03/21 0901 .  Ipratropium-Albuterol (COMBIVENT) respimat 1 puff, 1 puff, Inhalation, Q6H, Amin, Sumayya, MD, 1 puff at 01/03/21 0900 .  levothyroxine (SYNTHROID) tablet 100 mcg, 100 mcg, Oral, Daily, Mansy, Jan A, MD, 100 mcg at 01/03/21 0900 .  magnesium hydroxide (MILK OF MAGNESIA) suspension 30 mL, 30 mL, Oral, Daily PRN, Mansy, Jan A, MD .  MEDLINE mouth rinse, 15 mL, Mouth Rinse, BID, Amin, Sumayya, MD, 15 mL at 01/03/21 1012 .  menthol-cetylpyridinium (CEPACOL) lozenge 3 mg, 1 lozenge, Oral, PRN, Lu Duffel, RPH .  methylPREDNISolone sodium succinate (SOLU-MEDROL) 125 mg/2 mL injection 60 mg, 60 mg, Intravenous, Q12H, Lorella Nimrod, MD, 60 mg at 01/03/21 1156 .  ondansetron (ZOFRAN) tablet 4 mg, 4 mg, Oral, Q6H PRN **OR** ondansetron (ZOFRAN) injection 4 mg, 4 mg, Intravenous, Q6H PRN, Mansy, Jan A, MD .  polyethylene glycol (MIRALAX / GLYCOLAX) packet 17 g, 17 g, Oral, Daily PRN, Mansy, Jan A, MD .  spironolactone  (ALDACTONE) tablet 25 mg, 25 mg, Oral, BID, Lanney Gins, Saniah Schroeter, MD, 25 mg at 01/03/21 0900 .  tamsulosin (FLOMAX) capsule 0.8 mg, 0.8 mg, Oral, Daily, Mansy, Jan A, MD, 0.8 mg at 01/03/21 0900 .  traZODone (DESYREL) tablet 50 mg, 50 mg, Oral, QHS, Mansy, Jan A, MD, 50 mg at 01/02/21 2200 .  zinc sulfate capsule 220 mg, 220 mg, Oral, Daily, Mansy, Jan A, MD, 220 mg at 01/03/21 0900    ALLERGIES   Patient has no known allergies.     REVIEW OF SYSTEMS    Review of Systems:  Gen:  Denies  fever, sweats, chills weigh loss  HEENT: Denies blurred vision, double vision, ear pain, eye pain, hearing loss, nose bleeds, sore throat Cardiac:  No dizziness, chest pain or heaviness, chest tightness,edema Resp:   Denies cough or sputum porduction, shortness of breath,wheezing, hemoptysis,  Gi: Denies swallowing difficulty, stomach pain, nausea or vomiting, diarrhea, constipation, bowel incontinence Gu:  Denies bladder incontinence, burning urine Ext:   Denies Joint pain, stiffness or swelling Skin: Denies  skin rash, easy bruising or bleeding or hives Endoc:  Denies polyuria, polydipsia , polyphagia or weight change Psych:   Denies depression, insomnia or hallucinations   Other:  All other systems negative   VS: BP (!) 149/68   Pulse 65   Temp (!) 96 F (35.6 C) (Axillary)   Resp (!) 26   Ht 6' (1.829 m)   Wt 86.8 kg   SpO2 (!) 83%   BMI 25.95 kg/m      PHYSICAL EXAM    GENERAL:NAD, no fevers, chills, no weakness no fatigue HEAD: Normocephalic, atraumatic.  EYES: Pupils equal, round, reactive to light. Extraocular muscles intact. No scleral icterus.  MOUTH: Moist mucosal membrane. Dentition intact. No abscess noted.  EAR, NOSE, THROAT: Clear without exudates. No external lesions.  NECK: Supple. No thyromegaly. No nodules. No JVD.  PULMONARY: rhonchi bilaterally  CARDIOVASCULAR: S1 and S2. Regular rate and rhythm. No murmurs, rubs, or gallops. No edema. Pedal pulses 2+  bilaterally.  GASTROINTESTINAL: Soft, nontender, nondistended. No masses. Positive bowel sounds. No hepatosplenomegaly.  MUSCULOSKELETAL: No swelling, clubbing, or edema. Range of motion full in all extremities.  NEUROLOGIC: Cranial nerves II through XII are intact. No gross focal neurological deficits. Sensation intact. Reflexes intact.  SKIN: No ulceration, lesions, rashes, or cyanosis. Skin warm and dry. Turgor intact.  PSYCHIATRIC: Mood, affect within normal  limits. The patient is awake, alert and oriented x 3. Insight, judgment intact.       IMAGING    DG Chest 2 View  Result Date: 12/28/2020 CLINICAL DATA:  Cough, hypoxia, shortness of breath. Congestion. The patient states that family members tested positive for COVID yesterday. EXAM: CHEST - 2 VIEW COMPARISON:  11/25/2020 FINDINGS: Interval improvement of previous hazy opacity over the left mid chest. Continued indistinct airspace opacities at the right lung base and along the minor fissure. Interstitial accentuation at the lung bases. Mild interstitial accentuation in the right mid lung. Mild enlargement of the cardiopericardial silhouette . No blunting of the costophrenic angles. IMPRESSION: 1. Multifocal interstitial and patchy airspace opacities. Differential diagnostic considerations include pulmonary edema or atypical pneumonia. 2. Interval clearing of prior left mid lung opacity shown on 11/25/2020. 3. Mild enlargement of the cardiopericardial silhouette Electronically Signed   By: Van Clines M.D.   On: 12/28/2020 13:13   CT ANGIO CHEST PE W OR WO CONTRAST  Result Date: 12/31/2020 CLINICAL DATA:  COVID-19 positive, shortness of breath, positive D-dimer EXAM: CT ANGIOGRAPHY CHEST WITH CONTRAST TECHNIQUE: Multidetector CT imaging of the chest was performed using the standard protocol during bolus administration of intravenous contrast. Multiplanar CT image reconstructions and MIPs were obtained to evaluate the vascular  anatomy. CONTRAST:  31mL OMNIPAQUE IOHEXOL 350 MG/ML SOLN COMPARISON:  12/28/2020 FINDINGS: Cardiovascular: Satisfactory opacification of the pulmonary arteries. Evaluation of the segmental and subsegmental branches is degraded by excessive respiratory motion artifact. No filling defect within the central, main, or lobar branch pulmonary arteries. No evidence of right heart strain. Heart size is mildly enlarged. No pericardial effusion. Thoracic aorta is nonaneurysmal. Atherosclerotic calcification of the aorta and coronary arteries. Mediastinum/Nodes: No axillary, mediastinal, or hilar lymphadenopathy. Trachea and esophagus within normal limits. Lungs/Pleura: Extensive patchy ground-glass opacity throughout both lungs, right greater than left. No pleural effusion or pneumothorax. Upper Abdomen: No acute abnormality. Musculoskeletal: No chest wall abnormality. No acute or significant osseous findings. Review of the MIP images confirms the above findings. IMPRESSION: 1. Evaluation of the segmental and subsegmental branches is degraded by excessive respiratory motion artifact. No evidence of pulmonary embolism to the lobar branch level. 2. Extensive patchy ground-glass opacity throughout both lungs, right greater than left, consistent with multifocal pneumonia in the setting of known COVID-19 infection. Aortic Atherosclerosis (ICD10-I70.0). Electronically Signed   By: Davina Poke D.O.   On: 12/31/2020 12:38      ASSESSMENT/PLAN    Acute COVID19 pneumonia with severe ARDS -Remdesevir antiviral - pharmacy protocol 5 d -vitamin C -zinc -solumedrol 60 bid  -Diuresis - Lasix 40 IV daily , aldactone increased to 25 bid , dcd hydralazine and losartan to allow higher BP with diuresis - monitor UOP - utilize external urinary catheter if possible -albumin 25% 1 amp daily x4days  -Self prone if patient can tolerate  -encourage to use IS and Acapella device for bronchopulmonary hygiene when able -d/c  hepatotoxic medications while on remdesevir -supportive care with ICU telemetry monitoring -PT/OT ordered -procalcitonin, CRP and ferritin trending   Atrial fibrillation with RVR    Rate controlled - continue PO amiodarone    - holding Eliquis due to hemoptysis  Non-massive hemoptysis    - monitor for additional episodes currently resolved     - hold eliquis    -SCDs for DVT ppx  Hypothyroidism    - continue levothyroxine 100    DM2   - c/w levemir /novolol weight based with ISS ACHS  GI ppx    - protonix      Thank you for allowing me to participate in the care of this patient.    This document was prepared using Dragon voice recognition software and may include unintentional dictation errors.  Critical care provider statement:    Critical care time (minutes):  33   Critical care time was exclusive of:  Separately billable procedures and  treating other patients   Critical care was necessary to treat or prevent imminent or  life-threatening deterioration of the following conditions:  Acute hypoxemic respiratory failure due to COVID19, hemoptysis, hypothyroidism, BPH, multiple comorbid conditions.   Critical care was time spent personally by me on the following  activities:  Development of treatment plan with patient or surrogate,  discussions with consultants, evaluation of patient's response to  treatment, examination of patient, obtaining history from patient or  surrogate, ordering and performing treatments and interventions, ordering  and review of laboratory studies and re-evaluation of patient's condition   I assumed direction of critical care for this patient from another  provider in my specialty: no      Ottie Glazier, M.D.  Division of [place]

## 2021-01-03 NOTE — Progress Notes (Signed)
Pt has desatted a few tx's tonightwith activity.  Lastest was in 32's and slow to

## 2021-01-03 NOTE — Progress Notes (Signed)
Pt has had 500cc urine output since Lasix given and O2 sat is now 93%.

## 2021-01-03 NOTE — Progress Notes (Signed)
Cross Cover Patient with increased oxygen requirements. Very poor reserve with desaturations reported in the 50's with minimal activity and very slow to recover.  However discussing with patient he states he does not feel short of breath. Desatuarion episode while I was at bedside was otherwise asymptomatic(no increased heart rate or increased work of breathing) Bilateral breath sounds with faint crackles.   Hypertensive with systolic in the 408'X  60 of IV lasix ordered Discussed plan with RN to notify me if increased work of breathing or desaturation sustained below 86

## 2021-01-03 NOTE — Evaluation (Signed)
Physical Therapy Evaluation Patient Details Name: Nathaniel Ibarra MRN: [id number] DOB: [birth year] Today's Date: 01/03/2021   History of Present Illness  Nathaniel Ibarra  is a 80 y.o. Caucasian male with a known history of hypertension, coronary artery disease, status post PCI and stent, paroxysmal atrial fibrillation, CHF, BPH and hypothyroidism, who presented to the emergency room with acute onset of worsening cough with dyspnea.  Clinical Impression  Pt received by PT today supine in bed willing to participate with PT evaluation. Pt on HFNC at 50 L continuous, not using non rebreather upon entry. Non rebreather donned and resting SpO2 94%, requiring multiple breaks during conversation to complete pursed lip breathing in order to maintain SpO2 >90%. Completed therapeutic exercises in bed including: heel slides, standing heel raises and ankle pumps x 10 each leg, VCs for appropriate pursed lip breathing during interventions to maintain SpO2. Completed supine<>sit transfer with minA for safety and line management, pt sat EOB for 5 min with VCs to complete pursed lip breathing to maintain SpO2 >90%. Sit<>stand transfer completed with minA pt's SpO2 dropped: 85% improved to 92% following VCs to complete pursed lip breathing and stand with a more upright posture. Pt able to tolerate standing for 3 minutes, then returned to seated EOB. Pt left seated EOB with nurse present in room.  Overall pt tolerated interventions fairly today, limited by SpO2 readings and his ability to maintain saturation >90%.     Follow Up Recommendations LTACH    Equipment Recommendations  Standard walker    Recommendations for Other Services       Precautions / Restrictions Precautions Precautions: None Restrictions Weight Bearing Restrictions: No      Mobility  Bed Mobility Overal bed mobility: Needs Assistance Bed Mobility: Supine to Sit     Supine to sit: Min assist     General bed mobility comments: Pt  with decreased strength, requiring continuous O2 needed minA for bed mobility for line managment and VCs to continue with pursed lip breathing.    Transfers Overall transfer level: Needs assistance Equipment used: Standard walker Transfers: Sit to/from Stand Sit to Stand: Min assist         General transfer comment: Pt required minA for sit<>stand transfer for safety, pt unsteady on feet due to BIL LE weakness  Ambulation/Gait                    Balance Overall balance assessment: Needs assistance Sitting-balance support: Feet supported Sitting balance-Leahy Scale: Fair Sitting balance - Comments: Pt with decreased stabiility completing dynamic seated activites requires CGA while seated EOB for safety   Standing balance support: Bilateral upper extremity supported Standing balance-Leahy Scale: Fair Standing balance comment: Pt with BIL LE weakness requires minA for all standing (static or dynamic activities) to maintain safety                             Pertinent Vitals/Pain Pain Assessment: No/denies pain    Home Living Family/patient expects to be discharged to:: Private residence Living Arrangements: Spouse/significant other Available Help at Discharge: Family;Available 24 hours/day Type of Home: House Home Access: Stairs to enter Entrance Stairs-Rails: None Entrance Stairs-Number of Steps: 3, no railing Home Layout: One level;Laundry or work area in [employer]: Hand held shower head      Prior Function Level of Independence: Independent         Comments: 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      General Comments      Exercises Total Joint Exercises Ankle Circles/Pumps: AROM;Both;20 reps Heel Slides: AROM;Both;10 reps Other Exercises Other Exercises: Educated pt on appropriate transfer tequnique from supine<>sit and sit<>stand, educated pt on appropriate RW use to maintain safety and prevent falls, educated pt on pursed lip breathing to maintain SpO2. Completed standing heel raises x10.   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 PT Goal Formulation: With patient Time For Goal Achievement: 01/17/21 Potential to Achieve Goals: Fair    Frequency Min 2X/week   Barriers to discharge        Co-evaluation               AM-PAC PT "6 Clicks" Mobility  Outcome Measure Help needed turning from your back to your side while in a flat bed without using bedrails?: A Little Help needed moving from lying on your back to sitting on the side of a flat bed without using bedrails?: A Little Help needed moving to and from a bed to a chair (including a wheelchair)?: A Little Help needed standing up from a chair using your arms (e.g., wheelchair  or bedside chair)?: A Little Help needed to walk in hospital room?: A Lot Help needed climbing 3-5 steps with a railing? : A Lot 6 Click Score: 16    End of Session Equipment Utilized During Treatment: Gait belt Activity Tolerance: Patient limited by lethargy;Treatment limited secondary to medical complications (Comment) (Pt limited due to SpO2 levels) Patient left: in bed;with nursing/sitter in room;with bed alarm set Nurse Communication: Mobility status PT Visit Diagnosis: Unsteadiness on feet (R26.81);Other abnormalities of gait and mobility (R26.89);Muscle weakness (generalized) (M62.81)    Time: 4081-4481 PT Time Calculation (min) (ACUTE ONLY): 30 min   Charges:   PT Evaluation $PT Eval Moderate Complexity: 1 Mod PT Treatments $Therapeutic Activity: 23-37 mins        Duanne Guess, PT, DPT 01/03/21, 1:28 PM   Isaias Cowman 01/03/2021, 1:28 PM

## 2021-01-03 NOTE — Progress Notes (Signed)
Pt has desatted a few times tonight with activty, Latest dropped into 50's.  Pt had taken NRB mask off each time and was active.  Pt is not tachypneic.  On call NP notified.  See orders.

## 2021-01-03 NOTE — TOC Progression Note (Addendum)
Transition of Care [facility]) - Progression Note    Patient Details  Name: Nathaniel Ibarra MRN: [id number] Date of Birth: 07-04-1941  Transition of [facility]) CM/SW Contact  Izola Price, RN Phone Number: 01/03/2021, 1:03 PM  Clinical Narrative:    01/02/21 1720 Transferred to ICU for worsening Covid/ARDS per provider notes.  PT recommends LTACH with standard walker Simmie Davies RN CM         Expected Discharge Plan and Services                                                 Social Determinants of Health (SDOH) Interventions    Readmission Risk Interventions Readmission Risk Prevention Plan 11/24/2020  Post Dischage Appt Complete  Medication Screening Complete  Transportation Screening Complete  Some recent data might be hidden

## 2021-01-03 NOTE — Progress Notes (Signed)
Sharion Settler, NP came to see pt and Lasix ordered and given 60mg  IVP.  Pt instructed to stay in bed.  New external cath applied by PCT.  Will cont to monitor.  O2 sat 89%.

## 2021-01-03 NOTE — Progress Notes (Addendum)
OT Cancellation Note  Patient Details Name: SELMA RODELO MRN: [id number] DOB: July 31, 1941   Cancelled Treatment:    Reason Eval/Treat Not Completed: Patient at procedure or test/ unavailable. Order received and chart reviewed. Upon arrival RT in room to assess/treat pt - noting SpO2 87% on 50L at rest in bed. Per conversation c RN - ok to hold this PM. Will follow up at later time/date as available to initiate services.   Dessie Coma, M.S. OTR/L  01/03/21, 3:27 PM  ascom [phone]

## 2021-01-04 ENCOUNTER — Inpatient Hospital Stay: Payer: PPO

## 2021-01-04 DIAGNOSIS — J1282 Pneumonia due to coronavirus disease 2019: Secondary | ICD-10-CM | POA: Diagnosis not present

## 2021-01-04 DIAGNOSIS — U071 COVID-19: Secondary | ICD-10-CM | POA: Diagnosis not present

## 2021-01-04 DIAGNOSIS — J9601 Acute respiratory failure with hypoxia: Secondary | ICD-10-CM | POA: Diagnosis not present

## 2021-01-04 LAB — GLUCOSE, CAPILLARY
Glucose-Capillary: 140 mg/dL — ABNORMAL HIGH (ref 70–99)
Glucose-Capillary: 265 mg/dL — ABNORMAL HIGH (ref 70–99)
Glucose-Capillary: 126 mg/dL — ABNORMAL HIGH (ref 70–99)
Glucose-Capillary: 205 mg/dL — ABNORMAL HIGH (ref 70–99)

## 2021-01-04 LAB — C-REACTIVE PROTEIN: CRP: 0.7 mg/dL (ref <1.0)

## 2021-01-04 LAB — CBC
HCT: 38.2 % — ABNORMAL LOW (ref 39.0–52.0)
Hemoglobin: 13.1 g/dL (ref 13.0–17.0)
MCH: 31 pg (ref 26.0–34.0)
MCHC: 34.3 g/dL (ref 30.0–36.0)
MCV: 90.5 fL (ref 80.0–100.0)
Platelets: 364 10*3/uL (ref 150–400)
RBC: 4.22 MIL/uL (ref 4.22–5.81)
RDW: 13.5 % (ref 11.5–15.5)
WBC: 27.6 10*3/uL — ABNORMAL HIGH (ref 4.0–10.5)
nRBC: 0 % (ref 0.0–0.2)

## 2021-01-04 LAB — COMPREHENSIVE METABOLIC PANEL
ALT: 35 U/L (ref 0–44)
AST: 20 U/L (ref 15–41)
Albumin: 3 g/dL — ABNORMAL LOW (ref 3.5–5.0)
Alkaline Phosphatase: 96 U/L (ref 38–126)
Anion gap: 11 (ref 5–15)
BUN: 35 mg/dL — ABNORMAL HIGH (ref 8–23)
CO2: 33 mmol/L — ABNORMAL HIGH (ref 22–32)
Calcium: 8.3 mg/dL — ABNORMAL LOW (ref 8.9–10.3)
Chloride: 97 mmol/L — ABNORMAL LOW (ref 98–111)
Creatinine, Ser: 0.96 mg/dL (ref 0.61–1.24)
GFR, Estimated: >60 mL/min (ref >60)
Glucose, Bld: 155 mg/dL — ABNORMAL HIGH (ref 70–99)
Potassium: 4 mmol/L (ref 3.5–5.1)
Sodium: 141 mmol/L (ref 135–145)
Total Bilirubin: 0.7 mg/dL (ref 0.3–1.2)
Total Protein: 5.4 g/dL — ABNORMAL LOW (ref 6.5–8.1)

## 2021-01-04 LAB — PROCALCITONIN: Procalcitonin: <0.1 ng/mL

## 2021-01-04 LAB — EXPECTORATED SPUTUM ASSESSMENT W GRAM STAIN, RFLX TO RESP C

## 2021-01-04 LAB — PHOSPHORUS: Phosphorus: 3.9 mg/dL (ref 2.5–4.6)

## 2021-01-04 LAB — MAGNESIUM: Magnesium: 2.4 mg/dL (ref 1.7–2.4)

## 2021-01-04 LAB — PROTIME-INR
INR: 1.3 — ABNORMAL HIGH (ref 0.8–1.2)
Prothrombin Time: 15.6 seconds — ABNORMAL HIGH (ref 11.4–15.2)

## 2021-01-04 LAB — FIBRIN DERIVATIVES D-DIMER (ARMC ONLY): Fibrin derivatives D-dimer (ARMC): 6838.33 ng/mL (FEU) — ABNORMAL HIGH (ref 0.00–499.00)

## 2021-01-04 MED ORDER — FUROSEMIDE 10 MG/ML IJ SOLN
40.0000 mg | Freq: Two times a day (BID) | INTRAMUSCULAR | Status: AC
  Administered 2021-01-04 – 2021-01-05 (×4): 40 mg via INTRAVENOUS
  Filled 2021-01-04 (×4): qty 4

## 2021-01-04 NOTE — Evaluation (Signed)
Occupational Therapy Evaluation Patient Details Name: Nathaniel Ibarra MRN: [id number] DOB: [birth year] Today's Date: 01/04/2021    History of Present Illness Nathaniel Ibarra  is a 80 y.o. Caucasian male with a known history of hypertension, coronary artery disease, status post PCI and stent, paroxysmal atrial fibrillation, CHF, BPH and hypothyroidism, who presented to the emergency room with acute onset of worsening cough with dyspnea.   Clinical Impression   Nathaniel Ibarra was seen for OT evaluation this date. 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 Pt lives with spouse in home c 3 STE. Pt presents to acute OT demonstrating impaired ADL performance and functional mobility 2/2 decreased activity tolerance and functional balance deficits. Pt currently MOD I don/doff B socks seated EOB. CGA + HHA initial sit<>stand improving to SBA for x5 standing trials. SpO2 mid 90s on 40L HFNC - desat 87% with standing marching ~2 mins, resolved c seated rest. Pt would benefit from skilled OT to address noted impairments and functional limitations (see below for any additional details) in order to maximize safety and independence while minimizing falls risk and caregiver burden. Upon hospital discharge, recommend HOT to maximize pt safety and return to functional independence during meaningful occupations of daily life.     Follow Up Recommendations  Home health OT    Equipment Recommendations  3 in 1 bedside commode    Recommendations for Other Services       Precautions / Restrictions Precautions Precautions: Fall Restrictions Weight Bearing Restrictions: No      Mobility Bed Mobility Overal bed mobility: Needs Assistance Bed Mobility: Supine to Sit;Sit to Supine     Supine to sit: Min guard;HOB elevated Sit to supine: Min guard   General bed mobility comments: assist for lines mgmt    Transfers Overall transfer level: Needs  assistance Equipment used: None Transfers: Sit to/from Stand Sit to Stand: Min guard         General transfer comment: Pt completed x5 STS from EOB c SBA    Balance Overall balance assessment: Needs assistance Sitting-balance support: Feet supported Sitting balance-Leahy Scale: Good     Standing balance support: Single extremity supported;During functional activity Standing balance-Leahy Scale: Good Standing balance comment: Reaches outisde BOS                           ADL either performed or assessed with clinical judgement   ADL Overall ADL's : Needs assistance/impaired                                       General ADL Comments: MOD I don/doff B socks seated EOB. CGA + HHA for simulated BSC t/f and standing grooming tasks                  Pertinent Vitals/Pain Pain Assessment: No/denies pain     Hand Dominance Right   Extremity/Trunk Assessment Upper Extremity Assessment Upper Extremity Assessment: Overall WFL for tasks assessed   Lower Extremity Assessment Lower Extremity Assessment: Overall WFL for tasks assessed       Communication Communication Communication: No difficulties   Cognition Arousal/Alertness: Awake/alert Behavior During Therapy: WFL for tasks assessed/performed Overall Cognitive Status: Within Functional Limits for tasks assessed  General Comments  SpO2 mid 90s on 40L HFNC - desat 87% with standing marching ~2 mins, resolved c seated rest    Exercises Exercises: Other exercises Other Exercises Other Exercises: Pt educated re: OT role, DME recs, d/c recs, falls prevention, ECS, HEP Other Exercises: LBD, sup<>sit, sit<>stand, sitting/standing balance/tolerance   Shoulder Instructions      Home Living Family/patient expects to be discharged to:: Private residence Living Arrangements: Spouse/significant other Available Help at Discharge: Family;Available  24 hours/day Type of Home: House Home Access: Stairs to enter [employer] of Steps: 3 Entrance Stairs-Rails: None Home Layout: One level;Laundry or work area in basement     [employer] Shower/Tub: [facility];Tub only         Home Equipment: Hand held shower head          Prior Functioning/Environment Level of Independence: Independent        Comments: 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, walking without any AD        OT Problem List: Decreased activity tolerance;Impaired balance (sitting and/or standing);Cardiopulmonary status limiting activity      OT Treatment/Interventions: Self-care/ADL training;Therapeutic exercise;Therapeutic activities;Patient/family education;Balance training;Energy conservation    OT Goals(Current goals can be found in the care plan section) Acute Rehab OT Goals 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 OT Goal Formulation: With patient Time For Goal Achievement: 01/18/21 Potential to Achieve Goals: Good ADL Goals Pt Will Perform Grooming: Independently;standing Pt Will Transfer to Toilet: Independently;ambulating;regular height toilet Additional ADL Goal #1: Pt will Independently verbalize plan to implement x3 ECS  OT Frequency: Min 1X/week    AM-PAC OT "6 Clicks" Daily Activity     Outcome Measure Help from another person eating meals?: None Help from another person taking care of personal grooming?: A Little Help from another person toileting, which includes using toliet, bedpan, or urinal?: A Little Help from another person bathing (including washing, rinsing, drying)?: A Little Help from another person to put on and taking off regular upper body clothing?: None Help from another person to put on and taking off regular lower body clothing?: None 6 Click Score: 21   End of Session Nurse Communication: Mobility status  Activity Tolerance: Patient tolerated treatment well Patient left: in bed;with call  bell/phone within reach  OT Visit Diagnosis: Other abnormalities of gait and mobility (R26.89)                Time: 2878-6767 OT Time Calculation (min): 21 min Charges:  OT General Charges $OT Visit: 1 Visit OT Evaluation $OT Eval Moderate Complexity: 1 Mod OT Treatments $Self Care/Home Management : 8-22 mins  Dessie Coma, M.S. OTR/L  01/04/21, 2:17 PM  ascom [phone]

## 2021-01-04 NOTE — Progress Notes (Signed)
Physical Therapy Treatment Patient Details Name: Nathaniel Ibarra MRN: [id number] DOB: 03-Dec-1941 Today's Date: 01/04/2021    History of Present Illness Nathaniel Ibarra  is a 80 y.o. Caucasian male with a known history of hypertension, coronary artery disease, status post PCI and stent, paroxysmal atrial fibrillation, CHF, BPH and hypothyroidism, who presented to the emergency room with acute onset of worsening cough with dyspnea.    PT Comments    Received continue upon transfer orders, cleared to work with therapy. Pt received in bed, agreeable to mobility efforts. Supine/seated there-ex performed with O2 sats monitored as pt still on 60L of HFNC. Sats desat to 86% with exertion. Continues to be very motivated to participate. Anticipate HHPT. Will continue to progress.   Follow Up Recommendations  Home health PT;Supervision for mobility/OOB     Equipment Recommendations  Rolling walker with 5" wheels    Recommendations for Other Services       Precautions / Restrictions Precautions Precautions: Fall Restrictions Weight Bearing Restrictions: No    Mobility  Bed Mobility Overal bed mobility: Needs Assistance Bed Mobility: Supine to Sit     Supine to sit: Min guard;HOB elevated Sit to supine: Supervision   General bed mobility comments: safe technique. Assistance for line management  Transfers Overall transfer level: Needs assistance Equipment used: None Transfers: Sit to/from Stand Sit to Stand: Min guard         General transfer comment: Unsteady, bracing B LEs against bed. Anticipate improvement with AD  Ambulation/Gait Ambulation/Gait assistance: Min guard Gait Distance (Feet): 2 Feet Assistive device: None Gait Pattern/deviations: Step-to pattern     General Gait Details: side stepped at edge of bed, limited by O2 tubing and line management. O2 sats decreased to 86% with exertion, however quickly improves to 99% with rest break.   Stairs              Wheelchair Mobility    Modified Rankin (Stroke Patients Only)       Balance Overall balance assessment: Needs assistance Sitting-balance support: Feet supported Sitting balance-Leahy Scale: Good     Standing balance support: Single extremity supported;During functional activity Standing balance-Leahy Scale: Good Standing balance comment: Reaches outisde BOS                            Cognition Arousal/Alertness: Awake/alert Behavior During Therapy: WFL for tasks assessed/performed Overall Cognitive Status: Within Functional Limits for tasks assessed                                        Exercises Other Exercises Other Exercises: Pt educated re: OT role, DME recs, d/c recs, falls prevention, ECS, HEP Other Exercises: LBD, sup<>sit, sit<>stand, sitting/standing balance/tolerance Other Exercises: Supine/seated ther-ex performed on B LE including quad sets, SLRs, heel slides, LAQ, and alt seated marching. All exercise performed x 10 reps Other Exercises: Performed IS at 77mL x 10 reps.    General Comments General comments (skin integrity, edema, etc.): SpO2 mid 90s on 40L HFNC - desat 87% with standing marching ~2 mins, resolved c seated rest      Pertinent Vitals/Pain Pain Assessment: No/denies pain    Home Living Family/patient expects to be discharged to:: Private residence Living Arrangements: Spouse/significant other Available Help at Discharge: Family;Available 24 hours/day Type of Home: House Home Access: Stairs to enter Entrance Stairs-Rails: None Home Layout: One  level;Laundry or work area in [employer]: Hand held shower head      Prior Function Level of Independence: Independent      Comments: 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, walking without any AD   PT Goals (current goals can now be found in the care plan section) Acute Rehab PT Goals 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 PT Goal  Formulation: With patient Time For Goal Achievement: 01/17/21 Potential to Achieve Goals: Fair Progress towards PT goals: Progressing toward goals    Frequency    Min 2X/week      PT Plan Discharge plan needs to be updated    Co-evaluation              AM-PAC PT "6 Clicks" Mobility   Outcome Measure  Help needed turning from your back to your side while in a flat bed without using bedrails?: A Little Help needed moving from lying on your back to sitting on the side of a flat bed without using bedrails?: A Little Help needed moving to and from a bed to a chair (including a wheelchair)?: A Little Help needed standing up from a chair using your arms (e.g., wheelchair or bedside chair)?: A Little Help needed to walk in hospital room?: A Little Help needed climbing 3-5 steps with a railing? : A Lot 6 Click Score: 17    End of Session Equipment Utilized During Treatment: Gait belt Activity Tolerance: Patient tolerated treatment well Patient left: in bed Nurse Communication: Mobility status PT Visit Diagnosis: Unsteadiness on feet (R26.81);Other abnormalities of gait and mobility (R26.89);Muscle weakness (generalized) (M62.81)     Time: 6237-6283 PT Time Calculation (min) (ACUTE ONLY): 26 min  Charges:  $Gait Training: 8-22 mins $Therapeutic Exercise: 8-22 mins                     Nathaniel Ibarra, PT, DPT [phone]    Nathaniel Ibarra 01/04/2021, 4:12 PM

## 2021-01-04 NOTE — Progress Notes (Signed)
NAME:  Nathaniel Ibarra, MRN:  [id number], DOB:  [birth year], LOS: 7 ADMISSION DATE:  12/28/2020, CONSULTATION DATE:  01/02/21 REFERRING MD:  Mauro Kaufmann , CHIEF COMPLAINT:  Respiratory failure  Brief History:  As per admission h/p SteveMcPhersonis a79 y.o.Caucasian malewith a known history of hypertension, coronary artery disease, status post PCI and stent,paroxysmal atrial fibrillation,CHF, BPH and hypothyroidism, who presented to the emergency room with acute onset of worsening coughwith dyspnea. His symptoms started about a week and a half ago with sore throat followed by dry cough occasionally productive of whitish and greenish sputum. He denies any fever chills. He admitted to significant diminished sense of taste and smell. He has been having fatigue and tiredness. He denied any loss of appetite. No nausea or vomiting or diarrhea. He has been exposed to a friend with COVID prior to his symptoms. His grandson and his wife tested positive for COVID-19 and their symptoms started 5 days ago.He denies any chest pain or palpitations. No dysuria, oliguria or hematuria or flank pain.  Upon presentation to the emergency room, blood pressure was 147/45 temperature was 99.1. Pulse oximetry was 86% on room air and 95% on 2 L of O2 by nasal cannula. CBC showed leukocytosis of 12.2 and anemia with hemoglobin of 12.2 and hematocrit 37.8 close to previous levels a month ago. COVID-19 PCR came back positive. BMP showed borderline potassium of 3.5 and a creatinine of 1.21 with a BUN of 26. Two-view chest x-ray showed multifocal interstitial and patchy airspace opacities with differential diagnosis including atypical pneumonia and pulmonary edema with interval clearing of prior left midlung opacity shown on 11/25/2020 and mild enlargement of the cardio-pericardial silhouette.  The patient was given Tessalon Perles, 125 mg of IV Solu-Medrol and was ordered IV remdesivir. He will be admitted to a  medical monitored isolation bed for further evaluation and management.  Pulmonary consultation placed for additional evaluation and management of severe acute hypoxemic respiratory failure.    01/03/21-  Patient with episodes of non-massive hemoptysis.  Eliquis 2.5 BID was dcd, monitoring patient in SDU as he is high risk for both hemoptysis and PE.   Past Medical History:  He,  has a past medical history of Bladder cancer ([place]), BPH (benign prostatic hyperplasia), CHF (congestive heart failure) ([place]), GERD (gastroesophageal reflux disease), Hypertension, Myocardial infarction ([place]), and Thyroid disease.   Significant Hospital Events:  Transferred to step down unit on 01/03/21  Consults:  PCCM  Procedures:    Significant Diagnostic Tests:  CTA Chest 12/31/20 1. Evaluation of the segmental and subsegmental branches is degraded by excessive respiratory motion artifact. No evidence of pulmonary embolism to the lobar branch level. 2. Extensive patchy ground-glass opacity throughout both lungs, right greater than left, consistent with multifocal pneumonia in the setting of known COVID-19 infection.  Micro Data:  01/01/21 MRSA PCR Negative   Antimicrobials:  Doxycycline 1/11 - 1/13  Ceftriaxone 1/14 - 1/16 Azithromycin 1/14 - 1/16   Interim History / Subjective:  Antibiotics stopped by overnight team as pro-calcitonin has been low Lasix was increased to twice daily dosing overnight  Patient is alert and conversant this AM. He continues to cough up sputum with dark brown appearance today. No frank blood in sputum. Denies any other complaints at this time.  Objective   Blood pressure (!) 174/77, pulse (!) 58, temperature 97.9 F (36.6 C), temperature source Oral, resp. rate (!) 27, height 6' (1.829 m), weight 86.8 kg, SpO2 95 %.    FiO2 (%):  [  98 %-100 %] 98 %   Intake/Output Summary (Last 24 hours) at 01/04/2021 0749 Last data filed at 01/04/2021 0200 Gross per 24 hour   Intake 1628.78 ml  Output 1975 ml  Net -346.22 ml   Filed Weights   01/02/21 0615 01/03/21 0500 01/03/21 1127  Weight: 84.1 kg 84.4 kg 86.8 kg    Examination: General: elderly male, ill appearing. Mild respiratory distress. HENT: [place]/AT. Moist mucous membranes. Sclera anicteric. PERRL Lungs: diminished air movement. Crackles at bases. No wheezing. Cardiovascular: RRR, s1s2 Abdomen: soft, non-tender, BS+ Extremities: warm, no edema Neuro: alert and awake. Oriented x3. Moving all extremities. GU: External catheter in place  Resolved Hospital Problem list     Assessment & Plan:   Acute Hypoxemic Respiratory failure due to Covid 19 Pneumonia - Continue HFNC, wean as able - Remdesivir completed 1/10 to 1/14 - Baricitinib received one dose on 1/10 - Actemra on 1/12 and again on 1/14 - Solumedrol started 1/10, continues on 60mg  BID. Plan to decrease to 40mg  BID tomorrow.  - Continue incentive spirometry  - Diuresis with lasix 40mg  BID and spironolactone 25mg  BID - Antibiotics were discontinued overnight. He comleted 6 days of CAP coverage between doxycycline and ceftriaxone + azithromycin. High risk for developing con-current bacterial pneumonia given the amount of immunosuppression for covid.    Hemoptysis In setting of pneumonia and anticoagulation - His hemoptysis appears to have slowed down - Eliquis has been held starting 1/16 - Recommend starting therapeutic lovenox and monitoring for any recurrence in his hemoptysis.  Rest of management per primary team.    Labs   CBC: Recent Labs  Lab 12/29/20 0345 12/30/20 0417 12/31/20 0352 01/01/21 0248 01/02/21 0509 01/03/21 0407 01/04/21 0304  WBC 12.8* 18.0* 17.9* 20.4* 24.1* 27.8* 27.6*  NEUTROABS 11.9* 16.4* 16.3* 18.4* 21.6*  --   --   HGB 12.6* 12.1* 12.1* 12.0* 12.4* 13.2 13.1  HCT 38.4* 35.8* 36.5* 35.5* 38.5* 39.2 38.2*  MCV 93.7 91.6 91.5 91.7 93.0 92.2 90.5  PLT 305 382 424* 387 371 374 364    Basic  Metabolic Panel: Recent Labs  Lab 12/28/20 2047 12/29/20 0345 12/31/20 0352 01/01/21 0248 01/02/21 0509 01/03/21 0407 01/04/21 0304  NA  --    < > 142 141 142 142 141  K  --    < > 4.0 3.4* 4.4 4.3 4.0  CL  --    < > 104 104 104 103 97*  CO2  --    < > 30 29 30 30  33*  GLUCOSE  --    < > 188* 227* 141* 223* 155*  BUN  --    < > 29* 32* 31* 32* 35*  CREATININE  --    < > 1.01 0.96 0.84 0.80 0.96  CALCIUM  --    < > 8.4* 8.1* 8.2* 8.5* 8.3*  MG 2.2  --   --   --   --   --  2.4  PHOS  --   --   --   --   --   --  3.9   < > = values in this interval not displayed.   GFR: Estimated Creatinine Clearance: 68.5 mL/min (by C-G formula based on SCr of 0.96 mg/dL). Recent Labs  Lab 12/28/20 2047 12/29/20 0345 12/30/20 0417 12/31/20 0352 01/01/21 0248 01/02/21 0509 01/03/21 0407 01/04/21 0304  PROCALCITON <0.10 <0.10 <0.10  --   --   --   --  <0.10  WBC  --  12.8* 18.0*   < > 20.4* 24.1* 27.8* 27.6*   < > = values in this interval not displayed.    Liver Function Tests: Recent Labs  Lab 12/31/20 0352 01/01/21 0248 01/02/21 0509 01/03/21 0407 01/04/21 0304  AST 25 26 30 24 20   ALT 35 40 43 40 35  ALKPHOS 76 84 89 106 96  BILITOT 0.5 0.6 0.6 0.6 0.7  PROT 5.8* 5.4* 5.3* 5.4* 5.4*  ALBUMIN 2.7* 2.5* 2.6* 2.9* 3.0*   No results for input(s): LIPASE, AMYLASE in the last 168 hours. No results for input(s): AMMONIA in the last 168 hours.  ABG    Component Value Date/Time   HCO3 25.3 (H) 09/07/2007 1236   TCO2 26 09/07/2007 1236     Coagulation Profile: Recent Labs  Lab 01/03/21 1921 01/04/21 0304  INR 1.3* 1.3*    Cardiac Enzymes: No results for input(s): CKTOTAL, CKMB, CKMBINDEX, TROPONINI in the last 168 hours.  HbA1C: Hgb A1c MFr Bld  Date/Time Value Ref Range Status  12/30/2020 04:17 AM 5.5 4.8 - 5.6 % Final    Comment:    (NOTE) Pre diabetes:          5.7%-6.4%  Diabetes:              >6.4%  Glycemic control for   <7.0% adults with diabetes      CBG: Recent Labs  Lab 01/03/21 0843 01/03/21 1154 01/03/21 1623 01/03/21 2114 01/04/21 0724  GLUCAP 153* 286* 181* 167* 140*       Critical care time: [place], MD [facility] Office: [phone]   See Amion for Pager Details

## 2021-01-04 NOTE — Progress Notes (Signed)
PROGRESS NOTE    Nathaniel Ibarra  WUJ:[id number] DOB: 1941-11-27 DOA: 12/28/2020 PCP: [name], MD   Brief Narrative: Taken from H&P Nathaniel Ibarra  is a 80 y.o. Caucasian male with a known history of hypertension, coronary artery disease, status post PCI and stent, paroxysmal atrial fibrillation, CHF, BPH and hypothyroidism, who presented to the emergency room with acute onset of worsening cough with dyspnea.  His symptoms started about a week and a half ago with sore throat followed by dry cough occasionally productive of whitish and greenish sputum.  He denies any fever chills.  He admitted to significant diminished sense of taste and smell.  He has been having fatigue and tiredness.  He denied any loss of appetite.  No nausea or vomiting or diarrhea.  He has been exposed to a friend with COVID prior to his symptoms.  His grandson and his wife tested positive yesterday for COVID-19 and their symptoms started 5 days ago. COVID-19 PCR was positive, chest x-ray with bilateral infiltrate consistent with atypical pneumonia/pulmonary edema. He was hypoxic in high 80s requiring 2 to 4 L of oxygen initially currently on 6 L. He was started on remdesivir, steroid and baricitinib. Baricitinib discontinued due to worsening leukocytosis.  patient is vaccinated with 2 doses and no booster. Patient desaturated later in the day requiring nonrebreather, 1 dose of Actemra given.  CTA ordered for today although he is on Eliquis, which was negative for PE although films were little motion degraded. Worsening oxygen requirement today-transitioning to heated high flow. Received 2 doses of Actemra. He was started on antibiotics for worsening leukocytosis. Remained on maximum setting of heated high flow today. Complaining of hemoptysis, discontinuing Eliquis and a pulmonary consult, concern of alveolar hemorrhage. Patient with worsening hypoxia, hemoptysis seems improving, transferred to  stepdown.  Subjective: Patient was saturating in mid 90s on 50L and 95%, small improvement.  Able to speak in full sentences.  Very small streaking of blood with coughing.  Assessment & Plan:   Active Problems:   Pneumonia due to COVID-19 virus  Acute hypoxic respiratory failure secondary to COVID-19 pneumonia. Patient with neutrophilic predominant leukocytosis, procalcitonin remain negative, markedly elevated CRP, D-dimer trending up, 1 dose of Actemra given yesterday due to worsening oxygen requirement. -Get CTA-patient was already on Eliquis, it was negative for PE but films were little motion degraded.  Completed the course of remdesivir and received 2 doses of Actemra. 1/14.  Patient has worsening oxygen requirement today, CTA with extensive bilateral infiltrate,  Worsening leukocytosis with left shift.  Worsening inflammatory markers.  MRSA PCR negative. 1/15.  Patient with new onset hemoptysis, he was on Eliquis, worsening oxygen requirement and inflammatory markers.  Hemoglobin stable, no frank bleeding but quite a bit of blood-tinged sputum in a cup. 1/16.  Worsening hypoxia-transferring to stepdown as he might need BiPAP or higher level of care.  High risk for more hemoptysis and PE. 1/17.  Completed the antibiotics for CAP coverage -Pulmonary consult-appreciate their help. -Keep holding Eliquis. -Continue with steroid-day 8 -Continue with lasix and spironolactone -Continue to monitor inflammatory markers. -Continue with supplemental oxygen to keep the saturation above 90%. -Continue with supportive care and supplements. -Proning if tolerated. -Chest PT -PT is recommending LTAC placement.  Paroxysmal atrial fibrillation.  Currently rate well controlled.   -Continue with amiodarone. -Keep holding Eliquis due to concern of hemoptysis. -High risk for PE.  Hypertension.  Blood pressure remained elevated,  -Continue with  spironolactone. -Continue with increased dose of  Cozaar. -Hydralazine 25 mg every 8 hourly as needed.  Hypothyroidism. -Continue with home dose of Synthroid.  Hyperglycemia.  No prior diagnosis of diabetes, most likely steroid-induced. -Check A1c- 5.5 he is not diabetic -Continue SSI as needed for steroid-induced hyperglycemia. -Continue 10 units of Lantus twice daily.  BPH. -Continue home dose of Flomax and Proscar  Depression.  Paxil was listed in his chart but apparently patient stopped taking it. -Discontinue Paxil and monitor.  Objective: Vitals:   01/04/21 0700 01/04/21 0800 01/04/21 0900 01/04/21 1000  BP: (!) 174/77 (!) 178/95 (!) 168/77 (!) 158/66  Pulse: (!) 58 (!) 57 (!) 57 (!) 54  Resp: (!) 27 16 (!) 25 (!) 27  Temp:  98.4 F (36.9 C)    TempSrc:  Oral    SpO2: 95% 93% 93% 97%  Weight:      Height:        Intake/Output Summary (Last 24 hours) at 01/04/2021 1257 Last data filed at 01/04/2021 1038 Gross per 24 hour  Intake 1388.78 ml  Output 1625 ml  Net -236.22 ml   Filed Weights   01/02/21 0615 01/03/21 0500 01/03/21 1127  Weight: 84.1 kg 84.4 kg 86.8 kg    Examination:  General.  Well-developed elderly man, in no acute distress. Pulmonary.  Lungs clear bilaterally, normal respiratory effort. CV.  Regular rate and rhythm, no JVD, rub or murmur. Abdomen.  Soft, nontender, nondistended, BS positive. CNS.  Alert and oriented x3.  No focal neurologic deficit. Extremities.  No edema, no cyanosis, pulses intact and symmetrical. Psychiatry.  Judgment and insight appears normal.  DVT prophylaxis: SCDs Code Status: Full Family Communication: Wife was updated on phone by PCCM and nursing staff. Disposition Plan:  Status is: Inpatient  Remains inpatient appropriate because:Inpatient level of care appropriate due to severity of illness   Dispo: The patient is from: Home              Anticipated d/c is to: Home              Anticipated d/c date is: 3-4 days.               Patient currently is not  medically stable to d/c.  Patient is critically ill, high risk for deterioration and death.   Consultants:   Pulmonology  Procedures:  Antimicrobials:   Data Reviewed: I have personally reviewed following labs and imaging studies  CBC: Recent Labs  Lab 12/29/20 0345 12/30/20 0417 12/31/20 0352 01/01/21 0248 01/02/21 0509 01/03/21 0407 01/04/21 0304  WBC 12.8* 18.0* 17.9* 20.4* 24.1* 27.8* 27.6*  NEUTROABS 11.9* 16.4* 16.3* 18.4* 21.6*  --   --   HGB 12.6* 12.1* 12.1* 12.0* 12.4* 13.2 13.1  HCT 38.4* 35.8* 36.5* 35.5* 38.5* 39.2 38.2*  MCV 93.7 91.6 91.5 91.7 93.0 92.2 90.5  PLT 305 382 424* 387 371 374 767   Basic Metabolic Panel: Recent Labs  Lab 12/28/20 2047 12/29/20 0345 12/31/20 0352 01/01/21 0248 01/02/21 0509 01/03/21 0407 01/04/21 0304  NA  --    < > 142 141 142 142 141  K  --    < > 4.0 3.4* 4.4 4.3 4.0  CL  --    < > 104 104 104 103 97*  CO2  --    < > 30 29 30 30  33*  GLUCOSE  --    < > 188* 227* 141* 223* 155*  BUN  --    < > 29* 32* 31* 32* 35*  CREATININE  --    < > 1.01 0.96 0.84 0.80 0.96  CALCIUM  --    < > 8.4* 8.1* 8.2* 8.5* 8.3*  MG 2.2  --   --   --   --   --  2.4  PHOS  --   --   --   --   --   --  3.9   < > = values in this interval not displayed.   GFR: Estimated Creatinine Clearance: 68.5 mL/min (by C-G formula based on SCr of 0.96 mg/dL). Liver Function Tests: Recent Labs  Lab 12/31/20 0352 01/01/21 0248 01/02/21 0509 01/03/21 0407 01/04/21 0304  AST 25 26 30 24 20   ALT 35 40 43 40 35  ALKPHOS 76 84 89 106 96  BILITOT 0.5 0.6 0.6 0.6 0.7  PROT 5.8* 5.4* 5.3* 5.4* 5.4*  ALBUMIN 2.7* 2.5* 2.6* 2.9* 3.0*   No results for input(s): LIPASE, AMYLASE in the last 168 hours. No results for input(s): AMMONIA in the last 168 hours. Coagulation Profile: Recent Labs  Lab 01/03/21 1921 01/04/21 0304  INR 1.3* 1.3*   Cardiac Enzymes: No results for input(s): CKTOTAL, CKMB, CKMBINDEX, TROPONINI in the last 168 hours. BNP (last  3 results) No results for input(s): PROBNP in the last 8760 hours. HbA1C: No results for input(s): HGBA1C in the last 72 hours. CBG: Recent Labs  Lab 01/03/21 1154 01/03/21 1623 01/03/21 2114 01/04/21 0724 01/04/21 1134  GLUCAP 286* 181* 167* 140* 265*   Lipid Profile: No results for input(s): CHOL, HDL, LDLCALC, TRIG, CHOLHDL, LDLDIRECT in the last 72 hours. Thyroid Function Tests: No results for input(s): TSH, T4TOTAL, FREET4, T3FREE, THYROIDAB in the last 72 hours. Anemia Panel: Recent Labs    01/02/21 0509  FERRITIN 430*   Sepsis Labs: Recent Labs  Lab 12/28/20 2047 12/29/20 0345 12/30/20 0417 01/04/21 0304  PROCALCITON <0.10 <0.10 <0.10 <0.10    Recent Results (from the past 240 hour(s))  Culture, sputum-assessment     Status: None   Collection Time: 12/30/20  9:59 AM   Specimen: Sputum  Result Value Ref Range Status   Specimen Description SPUTUM  Final   Special Requests NONE  Final   Sputum evaluation   Final    THIS SPECIMEN IS ACCEPTABLE FOR SPUTUM CULTURE Performed at [facility], [address], [place], [place] [address]    Report Status 12/30/2020 FINAL  Final  Culture, respiratory     Status: None   Collection Time: 12/30/20  9:59 AM   Specimen: SPU  Result Value Ref Range Status   Specimen Description   Final    SPUTUM Performed at [facility], [address] [place], [place] [address]    Special Requests   Final    NONE Reflexed from [id number] Performed at [facility], [address] [place], [place] [address]    Gram Stain   Final    RARE WBC PRESENT, PREDOMINANTLY PMN FEW GRAM POSITIVE COCCI IN CHAINS RARE GRAM POSITIVE RODS    Culture   Final    FEW Normal respiratory flora-no Staph aureus or Pseudomonas seen Performed at [facility], [place] [address], [place], [place] [address]    Report Status 01/02/2021 FINAL  Final  MRSA PCR Screening     Status: None   Collection Time: 01/01/21  8:47 AM    Specimen: Nasal Mucosa; Nasopharyngeal  Result Value Ref Range Status   MRSA by PCR NEGATIVE NEGATIVE Final  Comment:        The GeneXpert MRSA Assay (FDA approved for NASAL specimens only), is one component of a comprehensive MRSA colonization surveillance program. It is not intended to diagnose MRSA infection nor to guide or monitor treatment for MRSA infections. Performed at [facility], [address] [place], [place] [address]   Expectorated sputum assessment w rflx to resp cult     Status: None   Collection Time: 01/04/21 10:40 AM   Specimen: Expectorated Sputum  Result Value Ref Range Status   Specimen Description EXPECTORATED SPUTUM  Final   Special Requests NONE  Final   Sputum evaluation   Final    Sputum specimen not acceptable for testing.  Please recollect.   NOTIFIED CHLOE ALLEN FOR RECOLLECT ON 01/04/21 AT 23 QSD Performed at [facility], [address] [place],  [address]    Report Status 01/04/2021 FINAL  Final     Radiology Studies: [facility] Chest Port 1 View  Result Date: 01/04/2021 CLINICAL DATA:  Shortness of breath EXAM: PORTABLE CHEST 1 VIEW COMPARISON:  December 28, 2020 FINDINGS: The lung volumes are low. There are worsening bilateral hazy airspace opacities. There are probable developing pleural effusions. The heart size remains enlarged but stable from prior study. There is no pneumothorax. IMPRESSION: Worsening bilateral hazy airspace opacities. Electronically Signed   By: Constance Holster M.D.   On: 01/04/2021 04:31    Scheduled Meds: . amiodarone  200 mg Oral Daily  . vitamin C  500 mg Oral Daily  . Chlorhexidine Gluconate Cloth  6 each Topical Q2200  . cholecalciferol  1,000 Units Oral Daily  . famotidine  20 mg Oral BID  . finasteride  5 mg Oral Daily  . furosemide  40 mg Intravenous BID  . guaiFENesin  600 mg Oral BID  . insulin aspart  0-15 Units Subcutaneous TID WC  . insulin aspart  0-5 Units Subcutaneous  QHS  . insulin glargine  10 Units Subcutaneous BID  . Ipratropium-Albuterol  1 puff Inhalation Q6H  . levothyroxine  100 mcg Oral Daily  . mouth rinse  15 mL Mouth Rinse BID  . methylPREDNISolone (SOLU-MEDROL) injection  60 mg Intravenous Q12H  . spironolactone  25 mg Oral BID  . tamsulosin  0.8 mg Oral Daily  . traZODone  50 mg Oral QHS  . zinc sulfate  220 mg Oral Daily   Continuous Infusions: . sodium chloride 5 mL/hr at 01/04/21 0914  . albumin human 12.5 g (01/04/21 0857)     LOS: 7 days   Time spent: 40 minutes.  Lorella Nimrod, MD [facility]  If 7PM-7AM, please contact night-coverage [email or web address]  01/04/2021, 12:57 PM   This record has been created using Systems analyst. Errors have been sought and corrected,but may not always be located. Such creation errors do not reflect on the standard of care.

## 2021-01-05 DIAGNOSIS — J9601 Acute respiratory failure with hypoxia: Secondary | ICD-10-CM | POA: Diagnosis not present

## 2021-01-05 DIAGNOSIS — J1282 Pneumonia due to coronavirus disease 2019: Secondary | ICD-10-CM | POA: Diagnosis not present

## 2021-01-05 DIAGNOSIS — U071 COVID-19: Secondary | ICD-10-CM | POA: Diagnosis not present

## 2021-01-05 LAB — COMPREHENSIVE METABOLIC PANEL
ALT: 27 U/L (ref 0–44)
AST: 19 U/L (ref 15–41)
Albumin: 3 g/dL — ABNORMAL LOW (ref 3.5–5.0)
Alkaline Phosphatase: 90 U/L (ref 38–126)
Anion gap: 12 (ref 5–15)
BUN: 37 mg/dL — ABNORMAL HIGH (ref 8–23)
CO2: 32 mmol/L (ref 22–32)
Calcium: 8.6 mg/dL — ABNORMAL LOW (ref 8.9–10.3)
Chloride: 97 mmol/L — ABNORMAL LOW (ref 98–111)
Creatinine, Ser: 0.85 mg/dL (ref 0.61–1.24)
GFR, Estimated: >60 mL/min (ref >60)
Glucose, Bld: 142 mg/dL — ABNORMAL HIGH (ref 70–99)
Potassium: 4.4 mmol/L (ref 3.5–5.1)
Sodium: 141 mmol/L (ref 135–145)
Total Bilirubin: 0.7 mg/dL (ref 0.3–1.2)
Total Protein: 5.3 g/dL — ABNORMAL LOW (ref 6.5–8.1)

## 2021-01-05 LAB — GLUCOSE, CAPILLARY
Glucose-Capillary: 148 mg/dL — ABNORMAL HIGH (ref 70–99)
Glucose-Capillary: 261 mg/dL — ABNORMAL HIGH (ref 70–99)
Glucose-Capillary: 197 mg/dL — ABNORMAL HIGH (ref 70–99)
Glucose-Capillary: 319 mg/dL — ABNORMAL HIGH (ref 70–99)

## 2021-01-05 LAB — CBC
HCT: 38.8 % — ABNORMAL LOW (ref 39.0–52.0)
Hemoglobin: 13 g/dL (ref 13.0–17.0)
MCH: 30.4 pg (ref 26.0–34.0)
MCHC: 33.5 g/dL (ref 30.0–36.0)
MCV: 90.9 fL (ref 80.0–100.0)
Platelets: 351 10*3/uL (ref 150–400)
RBC: 4.27 MIL/uL (ref 4.22–5.81)
RDW: 13.6 % (ref 11.5–15.5)
WBC: 25.8 10*3/uL — ABNORMAL HIGH (ref 4.0–10.5)
nRBC: 0 % (ref 0.0–0.2)

## 2021-01-05 LAB — C-REACTIVE PROTEIN: CRP: 0.6 mg/dL (ref <1.0)

## 2021-01-05 LAB — FIBRIN DERIVATIVES D-DIMER (ARMC ONLY): Fibrin derivatives D-dimer (ARMC): 6303.12 ng/mL (FEU) — ABNORMAL HIGH (ref 0.00–499.00)

## 2021-01-05 MED ORDER — ENOXAPARIN SODIUM 40 MG/0.4ML ~~LOC~~ SOLN
40.0000 mg | SUBCUTANEOUS | Status: DC
  Administered 2021-01-05 – 2021-01-07 (×3): 40 mg via SUBCUTANEOUS
  Filled 2021-01-05 (×3): qty 0.4

## 2021-01-05 MED ORDER — SODIUM CHLORIDE 0.9 % IV SOLN
500.0000 mg | INTRAVENOUS | Status: DC
  Administered 2021-01-05 – 2021-01-06 (×2): 500 mg via INTRAVENOUS
  Filled 2021-01-05 (×4): qty 500

## 2021-01-05 MED ORDER — SODIUM CHLORIDE 0.9 % IV SOLN
2.0000 g | INTRAVENOUS | Status: DC
  Administered 2021-01-05 – 2021-01-06 (×2): 2 g via INTRAVENOUS
  Filled 2021-01-05: qty 20
  Filled 2021-01-05 (×2): qty 2
  Filled 2021-01-05: qty 20

## 2021-01-05 NOTE — Progress Notes (Signed)
Brief Pharmacy Note  Consult for enoxaparin for VTE prophylaxis. Discussed with provider. Will defer therapeutic dosing and give 40 mg subcu daily for now. If no further hemoptysis, will plan on resuming Eliquis.  Order for enoxaparin 40 mg subcu daily to start this afternoon.  Dorena Bodo, PharmD

## 2021-01-05 NOTE — Progress Notes (Signed)
Upon entrance to room, pt's bp cuff found lying in floor.  Last couple of bp's recorded are incorrect and were not even on patient.  V/s's redone.

## 2021-01-05 NOTE — Progress Notes (Signed)
PROGRESS NOTE    Nathaniel Ibarra  [id number] DOB: 06/17/1941 DOA: 12/28/2020 PCP: [name], MD   Brief Narrative: Taken from H&P Nathaniel Ibarra  is a 80 y.o. Caucasian male with a known history of hypertension, coronary artery disease, status post PCI and stent, paroxysmal atrial fibrillation, CHF, BPH and hypothyroidism, who presented to the emergency room with acute onset of worsening cough with dyspnea.  His symptoms started about a week and a half ago with sore throat followed by dry cough occasionally productive of whitish and greenish sputum.  He denies any fever chills.  He admitted to significant diminished sense of taste and smell.  He has been having fatigue and tiredness.  He denied any loss of appetite.  No nausea or vomiting or diarrhea.  He has been exposed to a friend with COVID prior to his symptoms.  His grandson and his wife tested positive yesterday for COVID-19 and their symptoms started 5 days ago. COVID-19 PCR was positive, chest x-ray with bilateral infiltrate consistent with atypical pneumonia/pulmonary edema. He was hypoxic in high 80s requiring 2 to 4 L of oxygen initially currently on 6 L. He was started on remdesivir, steroid and baricitinib. Baricitinib discontinued due to worsening leukocytosis.  patient is vaccinated with 2 doses and no booster. Patient desaturated later in the day requiring nonrebreather, 1 dose of Actemra given.  CTA ordered for today although he is on Eliquis, which was negative for PE although films were little motion degraded. Worsening oxygen requirement today-transitioning to heated high flow. Received 2 doses of Actemra, completed the course of remdesivir. He completed the course of antibiotics, which was started due to worsening leukocytosis.  MRSA PCR negative. Remained on maximum setting of heated high flow today. Complaining of hemoptysis, discontinuing Eliquis and a pulmonary consult, concern of alveolar  hemorrhage. Patient with worsening hypoxia, hemoptysis seems improving, transferred to stepdown.  Subjective: Patient remained stable overnight on 15 L of oxygen, desaturating again and he did high flow at 70% was restarted. Able to speak full sentences and denies any complaint.  He was asking about discharge.  Very mild streaking of blood on sputum.  Assessment & Plan:   Active Problems:   Pneumonia due to COVID-19 virus  Acute hypoxic respiratory failure secondary to COVID-19 pneumonia. Patient with neutrophilic predominant leukocytosis, procalcitonin remain negative, markedly elevated CRP, D-dimer trending up, peaked second time.  Completed the course of remdesivir and received 2 doses of Actemra. Sputum culture with normal respiratory flora. CTA was done due to worsening D-dimer and it was negative for PE, patient was on Eliquis at that time.  CRP has been normalized. Developed hemoptysis, holding Eliquis for the past 3 days, hemoptysis improved.  He was transferred to stepdown as he was high risk for PE and alveolar hemorrhage.  Worsening leukocytosis which did show mild improvement today, procalcitonin remain negative.  MRSA PCR negative -Pulmonary consult-appreciate their help. -Keep holding Eliquis, can resume home dose of Eliquis if no hemoptysis on Eliquis for next couple of days. -Start him on Lovenox for DVT prophylaxis-we will stop it if worsening of hemoptysis. -Will start him on ceftriaxone and Zithromax as he is high risk after getting 2 doses of Actemra. -Continue with steroid-day 9 -Continue with lasix and spironolactone -Continue to monitor inflammatory markers. -Continue with supplemental oxygen to keep the saturation above 90%. -Continue with supportive care and supplements. -Proning if tolerated. -Chest PT -PT is recommending HH.  Paroxysmal atrial fibrillation.  Currently rate well controlled.   -  Continue with amiodarone. -Keep holding Eliquis due to concern  of hemoptysis. -High risk for PE. -Starting him on Lovenox prophylaxis after discussing with PCCM.  Hypertension.  Blood pressure remained elevated,  -Continue with  spironolactone. -Continue with increased dose of Cozaar. -Hydralazine 25 mg every 8 hourly as needed.  Hypothyroidism. -Continue with home dose of Synthroid.  Hyperglycemia.  No prior diagnosis of diabetes, most likely steroid-induced. -Check A1c- 5.5 he is not diabetic -Continue SSI as needed for steroid-induced hyperglycemia. -Continue 10 units of Lantus twice daily.  BPH. -Continue home dose of Flomax and Proscar  Depression.  Paxil was listed in his chart but apparently patient stopped taking it. -Discontinue Paxil and monitor.  Objective: Vitals:   01/05/21 0640 01/05/21 0645 01/05/21 0650 01/05/21 0700  BP:    128/70  Pulse: (!) 59 (!) 56 (!) 55 (!) 51  Resp: 20 20 (!) 21 (!) 24  Temp:      TempSrc:      SpO2: (!) 87% (!) 86% (!) 81% (!) 87%  Weight:      Height:        Intake/Output Summary (Last 24 hours) at 01/05/2021 0909 Last data filed at 01/05/2021 0500 Gross per 24 hour  Intake 108.46 ml  Output 3075 ml  Net -2966.54 ml   Filed Weights   01/02/21 0615 01/03/21 0500 01/03/21 1127  Weight: 84.1 kg 84.4 kg 86.8 kg    Examination:  General.  Well-developed elderly man, in no acute distress. Pulmonary.  Lungs clear bilaterally, normal respiratory effort. CV.  Regular rate and rhythm, no JVD, rub or murmur. Abdomen.  Soft, nontender, nondistended, BS positive. CNS.  Alert and oriented x3.  No focal neurologic deficit. Extremities.  No edema, no cyanosis, pulses intact and symmetrical. Psychiatry.  Judgment and insight appears normal.  DVT prophylaxis: SCDs Code Status: Full Family Communication: Wife was updated on phone Disposition Plan:  Status is: Inpatient  Remains inpatient appropriate because:Inpatient level of care appropriate due to severity of illness   Dispo: The patient  is from: Home              Anticipated d/c is to: Home              Anticipated d/c date is: 3-4 days.               Patient currently is not medically stable to d/c.  Patient is critically ill, high risk for deterioration and death.   Consultants:   Pulmonology  Procedures:  Antimicrobials:  Ceftriaxone Zithromax  Data Reviewed: I have personally reviewed following labs and imaging studies  CBC: Recent Labs  Lab 12/30/20 0417 12/31/20 0352 01/01/21 0248 01/02/21 0509 01/03/21 0407 01/04/21 0304 01/05/21 0706  WBC 18.0* 17.9* 20.4* 24.1* 27.8* 27.6* 25.8*  NEUTROABS 16.4* 16.3* 18.4* 21.6*  --   --   --   HGB 12.1* 12.1* 12.0* 12.4* 13.2 13.1 13.0  HCT 35.8* 36.5* 35.5* 38.5* 39.2 38.2* 38.8*  MCV 91.6 91.5 91.7 93.0 92.2 90.5 90.9  PLT 382 424* 387 371 374 364 546   Basic Metabolic Panel: Recent Labs  Lab 01/01/21 0248 01/02/21 0509 01/03/21 0407 01/04/21 0304 01/05/21 0706  NA 141 142 142 141 141  K 3.4* 4.4 4.3 4.0 4.4  CL 104 104 103 97* 97*  CO2 29 30 30  33* 32  GLUCOSE 227* 141* 223* 155* 142*  BUN 32* 31* 32* 35* 37*  CREATININE 0.96 0.84 0.80 0.96 0.85  CALCIUM 8.1*  8.2* 8.5* 8.3* 8.6*  MG  --   --   --  2.4  --   PHOS  --   --   --  3.9  --    GFR: Estimated Creatinine Clearance: 77.3 mL/min (by C-G formula based on SCr of 0.85 mg/dL). Liver Function Tests: Recent Labs  Lab 01/01/21 0248 01/02/21 0509 01/03/21 0407 01/04/21 0304 01/05/21 0706  AST 26 30 24 20 19   ALT 40 43 40 35 27  ALKPHOS 84 89 106 96 90  BILITOT 0.6 0.6 0.6 0.7 0.7  PROT 5.4* 5.3* 5.4* 5.4* 5.3*  ALBUMIN 2.5* 2.6* 2.9* 3.0* 3.0*   No results for input(s): LIPASE, AMYLASE in the last 168 hours. No results for input(s): AMMONIA in the last 168 hours. Coagulation Profile: Recent Labs  Lab 01/03/21 1921 01/04/21 0304  INR 1.3* 1.3*   Cardiac Enzymes: No results for input(s): CKTOTAL, CKMB, CKMBINDEX, TROPONINI in the last 168 hours. BNP (last 3 results) No  results for input(s): PROBNP in the last 8760 hours. HbA1C: No results for input(s): HGBA1C in the last 72 hours. CBG: Recent Labs  Lab 01/04/21 0724 01/04/21 1134 01/04/21 1555 01/04/21 2105 01/05/21 0759  GLUCAP 140* 265* 126* 205* 148*   Lipid Profile: No results for input(s): CHOL, HDL, LDLCALC, TRIG, CHOLHDL, LDLDIRECT in the last 72 hours. Thyroid Function Tests: No results for input(s): TSH, T4TOTAL, FREET4, T3FREE, THYROIDAB in the last 72 hours. Anemia Panel: No results for input(s): VITAMINB12, FOLATE, FERRITIN, TIBC, IRON, RETICCTPCT in the last 72 hours. Sepsis Labs: Recent Labs  Lab 12/30/20 0417 01/04/21 0304  PROCALCITON <0.10 <0.10    Recent Results (from the past 240 hour(s))  Culture, sputum-assessment     Status: None   Collection Time: 12/30/20  9:59 AM   Specimen: Sputum  Result Value Ref Range Status   Specimen Description SPUTUM  Final   Special Requests NONE  Final   Sputum evaluation   Final    THIS SPECIMEN IS ACCEPTABLE FOR SPUTUM CULTURE Performed at [facility], [address] [place], [place] [address]    Report Status 12/30/2020 FINAL  Final  Culture, respiratory     Status: None   Collection Time: 12/30/20  9:59 AM   Specimen: SPU  Result Value Ref Range Status   Specimen Description   Final    SPUTUM Performed at [facility], [address], [place], [place] [address]    Special Requests   Final    NONE Reflexed from [id number] [id number] Performed at [facility], [address] [place], [place] [address]    Gram Stain   Final    RARE WBC PRESENT, PREDOMINANTLY PMN FEW GRAM POSITIVE COCCI IN CHAINS RARE GRAM POSITIVE RODS    Culture   Final    FEW Normal respiratory flora-no Staph aureus or Pseudomonas seen Performed at [facility], [place] [address] [place], [place] [address]    Report Status 01/02/2021 FINAL  Final  MRSA PCR Screening     Status: None   Collection Time: 01/01/21  8:47 AM    Specimen: Nasal Mucosa; Nasopharyngeal  Result Value Ref Range Status   MRSA by PCR NEGATIVE NEGATIVE Final    Comment:        The GeneXpert MRSA Assay (FDA approved for NASAL specimens only), is one component of a comprehensive MRSA colonization surveillance program. It is not intended to diagnose MRSA infection nor to guide or monitor treatment for MRSA infections. Performed  at [facility], [address] [place], [place] [address]   Expectorated sputum assessment w rflx to resp cult     Status: None   Collection Time: 01/04/21 10:40 AM   Specimen: Expectorated Sputum  Result Value Ref Range Status   Specimen Description EXPECTORATED SPUTUM  Final   Special Requests NONE  Final   Sputum evaluation   Final    Sputum specimen not acceptable for testing.  Please recollect.   NOTIFIED CHLOE ALLEN FOR RECOLLECT ON 01/04/21 AT 34 QSD Performed at [facility], [address] [place], [place] [address]    Report Status 01/04/2021 FINAL  Final     Radiology Studies: [facility] Chest Port 1 View  Result Date: 01/04/2021 CLINICAL DATA:  Shortness of breath EXAM: PORTABLE CHEST 1 VIEW COMPARISON:  December 28, 2020 FINDINGS: The lung volumes are low. There are worsening bilateral hazy airspace opacities. There are probable developing pleural effusions. The heart size remains enlarged but stable from prior study. There is no pneumothorax. IMPRESSION: Worsening bilateral hazy airspace opacities. Electronically Signed   By: Constance Holster M.D.   On: 01/04/2021 04:31    Scheduled Meds: . amiodarone  200 mg Oral Daily  . vitamin C  500 mg Oral Daily  . Chlorhexidine Gluconate Cloth  6 each Topical Q2200  . cholecalciferol  1,000 Units Oral Daily  . famotidine  20 mg Oral BID  . finasteride  5 mg Oral Daily  . furosemide  40 mg Intravenous BID  . guaiFENesin  600 mg Oral BID  . insulin aspart  0-15 Units Subcutaneous TID WC  . insulin aspart  0-5 Units Subcutaneous  QHS  . insulin glargine  10 Units Subcutaneous BID  . Ipratropium-Albuterol  1 puff Inhalation Q6H  . levothyroxine  100 mcg Oral Daily  . mouth rinse  15 mL Mouth Rinse BID  . methylPREDNISolone (SOLU-MEDROL) injection  60 mg Intravenous Q12H  . spironolactone  25 mg Oral BID  . tamsulosin  0.8 mg Oral Daily  . traZODone  50 mg Oral QHS  . zinc sulfate  220 mg Oral Daily   Continuous Infusions: . sodium chloride 5 mL/hr at 01/04/21 2300  . albumin human Stopped (01/04/21 1029)     LOS: 8 days   Time spent: 40 minutes.  Lorella Nimrod, MD [facility]  If 7PM-7AM, please contact night-coverage [email or web address]  01/05/2021, 9:09 AM   This record has been created using Systems analyst. Errors have been sought and corrected,but may not always be located. Such creation errors do not reflect on the standard of care.

## 2021-01-06 DIAGNOSIS — J1282 Pneumonia due to coronavirus disease 2019: Secondary | ICD-10-CM | POA: Diagnosis not present

## 2021-01-06 DIAGNOSIS — U071 COVID-19: Secondary | ICD-10-CM | POA: Diagnosis not present

## 2021-01-06 LAB — GLUCOSE, CAPILLARY
Glucose-Capillary: 152 mg/dL — ABNORMAL HIGH (ref 70–99)
Glucose-Capillary: 220 mg/dL — ABNORMAL HIGH (ref 70–99)
Glucose-Capillary: 154 mg/dL — ABNORMAL HIGH (ref 70–99)
Glucose-Capillary: 381 mg/dL — ABNORMAL HIGH (ref 70–99)

## 2021-01-06 LAB — COMPREHENSIVE METABOLIC PANEL
ALT: 29 U/L (ref 0–44)
AST: 22 U/L (ref 15–41)
Albumin: 3 g/dL — ABNORMAL LOW (ref 3.5–5.0)
Alkaline Phosphatase: 90 U/L (ref 38–126)
Anion gap: 10 (ref 5–15)
BUN: 37 mg/dL — ABNORMAL HIGH (ref 8–23)
CO2: 33 mmol/L — ABNORMAL HIGH (ref 22–32)
Calcium: 8.4 mg/dL — ABNORMAL LOW (ref 8.9–10.3)
Chloride: 97 mmol/L — ABNORMAL LOW (ref 98–111)
Creatinine, Ser: 0.95 mg/dL (ref 0.61–1.24)
GFR, Estimated: >60 mL/min (ref >60)
Glucose, Bld: 133 mg/dL — ABNORMAL HIGH (ref 70–99)
Potassium: 4 mmol/L (ref 3.5–5.1)
Sodium: 140 mmol/L (ref 135–145)
Total Bilirubin: 0.7 mg/dL (ref 0.3–1.2)
Total Protein: 5.3 g/dL — ABNORMAL LOW (ref 6.5–8.1)

## 2021-01-06 LAB — CBC
HCT: 39.2 % (ref 39.0–52.0)
Hemoglobin: 13.2 g/dL (ref 13.0–17.0)
MCH: 30.7 pg (ref 26.0–34.0)
MCHC: 33.7 g/dL (ref 30.0–36.0)
MCV: 91.2 fL (ref 80.0–100.0)
Platelets: 313 10*3/uL (ref 150–400)
RBC: 4.3 MIL/uL (ref 4.22–5.81)
RDW: 13.7 % (ref 11.5–15.5)
WBC: 27.9 10*3/uL — ABNORMAL HIGH (ref 4.0–10.5)
nRBC: 0 % (ref 0.0–0.2)

## 2021-01-06 LAB — FIBRIN DERIVATIVES D-DIMER (ARMC ONLY): Fibrin derivatives D-dimer (ARMC): 5145.84 ng/mL (FEU) — ABNORMAL HIGH (ref 0.00–499.00)

## 2021-01-06 LAB — C-REACTIVE PROTEIN: CRP: 0.6 mg/dL (ref <1.0)

## 2021-01-06 NOTE — Progress Notes (Signed)
PROGRESS NOTE    Nathaniel Ibarra  EGB:[id number] DOB: October 24, 1941 DOA: 12/28/2020 PCP: Sherron Monday, MD   Brief Narrative: Taken from H&P Mckinsey Dikes  is a 80 y.o. Caucasian male with a known history of hypertension, coronary artery disease, status post PCI and stent, paroxysmal atrial fibrillation, CHF, BPH and hypothyroidism, who presented to the emergency room with acute onset of worsening cough with dyspnea.  His symptoms started about a week and a half ago with sore throat followed by dry cough occasionally productive of whitish and greenish sputum.  He denies any fever chills.  He admitted to significant diminished sense of taste and smell.  He has been having fatigue and tiredness.  He denied any loss of appetite.  No nausea or vomiting or diarrhea.  He has been exposed to a friend with COVID prior to his symptoms.  His grandson and his wife tested positive yesterday for COVID-19 and their symptoms started 5 days ago. COVID-19 PCR was positive, chest x-ray with bilateral infiltrate consistent with atypical pneumonia/pulmonary edema. He was hypoxic in high 80s requiring 2 to 4 L of oxygen initially currently on 6 L. He was started on remdesivir, steroid and baricitinib. Baricitinib discontinued due to worsening leukocytosis.  patient is vaccinated with 2 doses and no booster. Patient desaturated later in the day requiring nonrebreather, 1 dose of Actemra given.  CTA ordered for today although he is on Eliquis, which was negative for PE although films were little motion degraded. Worsening oxygen requirement today-transitioning to heated high flow. Received 2 doses of Actemra, completed the course of remdesivir. He completed the course of antibiotics, which was started due to worsening leukocytosis.  MRSA PCR negative. Remained on maximum setting of heated high flow today. Complaining of hemoptysis, discontinuing Eliquis and a pulmonary consult, concern of alveolar  hemorrhage. Patient with worsening hypoxia, hemoptysis seems improving, transferred to stepdown.  Subjective: Pt reported feeling ok.  No N/V/D.     Assessment & Plan:   Active Problems:   Pneumonia due to COVID-19 virus  Acute hypoxic respiratory failure secondary to COVID-19 pneumonia. Patient with neutrophilic predominant leukocytosis, procalcitonin remain negative, markedly elevated CRP, D-dimer trending up, peaked second time.  Completed the course of remdesivir and received 2 doses of Actemra. Sputum culture with normal respiratory flora. CTA was done due to worsening D-dimer and it was negative for PE, patient was on Eliquis at that time.  CRP has been normalized. Developed hemoptysis, holding Eliquis for the past 3 days, hemoptysis improved.  He was transferred to stepdown as he was high risk for PE and alveolar hemorrhage.  Worsening leukocytosis which did show mild improvement, procalcitonin remain negative.  MRSA PCR negative -Pulmonary consult-appreciate their help. Plan: --cont empiric ceftriaxone and Zithromax as he is high risk after getting 2 doses of Actemra. --cont solumedrol -Continue spironolactone -Continue to monitor inflammatory markers. -Continue with supplemental oxygen to keep the saturation above 90%. --cont combivent scheduled  Paroxysmal atrial fibrillation.   Currently rate well controlled.   Plan: --cont amiodarone -Keep holding Eliquis due to concern of hemoptysis.  Hypertension.   --cont spironolactone  Hypothyroidism. --cont home Synthroid  Hyperglycemia 2/2 steroid use No prior diagnosis of diabetes, most likely steroid-induced. -Check A1c- 5.5 he is not diabetic -Continue SSI as needed for steroid-induced hyperglycemia. --cont Lantus 10u BID  BPH. -cont home Flomax and Proscar  Depression.   Paxil was listed in his chart but apparently patient stopped taking it.   Objective: Vitals:   01/06/21  3329 01/06/21 0800 01/06/21 0900  01/06/21 1536  BP:  (!) 157/64 (!) 122/100 137/62  Pulse:  (!) 52 (!) 57 (!) 57  Resp:  (!) 23 (!) 26 (!) 24  Temp:  (!) 97.1 F (36.2 C)  98.6 F (37 C)  TempSrc:  Axillary  Oral  SpO2: 91% 91% 93% 96%  Weight:      Height:        Intake/Output Summary (Last 24 hours) at 01/06/2021 1701 Last data filed at 01/06/2021 0800 Gross per 24 hour  Intake 1580.28 ml  Output 950 ml  Net 630.28 ml   Filed Weights   01/02/21 0615 01/03/21 0500 01/03/21 1127  Weight: 84.1 kg 84.4 kg 86.8 kg    Examination:  Constitutional: NAD, AAOx3 HEENT: conjunctivae and lids normal, EOMI CV: No cyanosis.   RESP: on heated hf, 90% 40L, crackles at bases Extremities: No effusions, edema in BLE SKIN: warm, dry Neuro: II - XII grossly intact.   Psych: Normal mood and affect.  Appropriate judgement and reason   DVT prophylaxis: SCDs Code Status: Full Family Communication: Wife was updated on phone Disposition Plan:  Status is: Inpatient  Remains inpatient appropriate because:Inpatient level of care appropriate due to severity of illness   Dispo: The patient is from: Home              Anticipated d/c is to: Home              Anticipated d/c date is: >3 days              Patient currently is not medically stable to d/c.  Still on heated hf.   Consultants:   Pulmonology  Procedures:  Antimicrobials:  Ceftriaxone Zithromax  Data Reviewed: I have personally reviewed following labs and imaging studies  CBC: Recent Labs  Lab 12/31/20 0352 01/01/21 0248 01/02/21 0509 01/03/21 0407 01/04/21 0304 01/05/21 0706 01/06/21 0322  WBC 17.9* 20.4* 24.1* 27.8* 27.6* 25.8* 27.9*  NEUTROABS 16.3* 18.4* 21.6*  --   --   --   --   HGB 12.1* 12.0* 12.4* 13.2 13.1 13.0 13.2  HCT 36.5* 35.5* 38.5* 39.2 38.2* 38.8* 39.2  MCV 91.5 91.7 93.0 92.2 90.5 90.9 91.2  PLT 424* 387 371 374 364 351 518   Basic Metabolic Panel: Recent Labs  Lab 01/02/21 0509 01/03/21 0407 01/04/21 0304 01/05/21 0706  01/06/21 0322  NA 142 142 141 141 140  K 4.4 4.3 4.0 4.4 4.0  CL 104 103 97* 97* 97*  CO2 30 30 33* 32 33*  GLUCOSE 141* 223* 155* 142* 133*  BUN 31* 32* 35* 37* 37*  CREATININE 0.84 0.80 0.96 0.85 0.95  CALCIUM 8.2* 8.5* 8.3* 8.6* 8.4*  MG  --   --  2.4  --   --   PHOS  --   --  3.9  --   --    GFR: Estimated Creatinine Clearance: 69.2 mL/min (by C-G formula based on SCr of 0.95 mg/dL). Liver Function Tests: Recent Labs  Lab 01/02/21 0509 01/03/21 0407 01/04/21 0304 01/05/21 0706 01/06/21 0322  AST 30 24 20 19 22   ALT 43 40 35 27 29  ALKPHOS 89 106 96 90 90  BILITOT 0.6 0.6 0.7 0.7 0.7  PROT 5.3* 5.4* 5.4* 5.3* 5.3*  ALBUMIN 2.6* 2.9* 3.0* 3.0* 3.0*   No results for input(s): LIPASE, AMYLASE in the last 168 hours. No results for input(s): AMMONIA in the last 168 hours. Coagulation Profile: Recent  Labs  Lab 01/03/21 1921 01/04/21 0304  INR 1.3* 1.3*   Cardiac Enzymes: No results for input(s): CKTOTAL, CKMB, CKMBINDEX, TROPONINI in the last 168 hours. BNP (last 3 results) No results for input(s): PROBNP in the last 8760 hours. HbA1C: No results for input(s): HGBA1C in the last 72 hours. CBG: Recent Labs  Lab 01/05/21 1512 01/05/21 2103 01/06/21 0802 01/06/21 1228 01/06/21 1646  GLUCAP 197* 319* 152* 220* 154*   Lipid Profile: No results for input(s): CHOL, HDL, LDLCALC, TRIG, CHOLHDL, LDLDIRECT in the last 72 hours. Thyroid Function Tests: No results for input(s): TSH, T4TOTAL, FREET4, T3FREE, THYROIDAB in the last 72 hours. Anemia Panel: No results for input(s): VITAMINB12, FOLATE, FERRITIN, TIBC, IRON, RETICCTPCT in the last 72 hours. Sepsis Labs: Recent Labs  Lab 01/04/21 0304  PROCALCITON <0.10    Recent Results (from the past 240 hour(s))  Culture, sputum-assessment     Status: None   Collection Time: 12/30/20  9:59 AM   Specimen: Sputum  Result Value Ref Range Status   Specimen Description SPUTUM  Final   Special Requests NONE  Final    Sputum evaluation   Final    THIS SPECIMEN IS ACCEPTABLE FOR SPUTUM CULTURE Performed at [facility], [address], [place], [place] [address]    Report Status 12/30/2020 FINAL  Final  Culture, respiratory     Status: None   Collection Time: 12/30/20  9:59 AM   Specimen: SPU  Result Value Ref Range Status   Specimen Description   Final    SPUTUM Performed at [facility], [address] [place], [place] [address]    Special Requests   Final    NONE Reflexed from [id number] Performed at [facility], [address] [place], [place] [address]    Gram Stain   Final    RARE WBC PRESENT, PREDOMINANTLY PMN FEW GRAM POSITIVE COCCI IN CHAINS RARE GRAM POSITIVE RODS    Culture   Final    FEW Normal respiratory flora-no Staph aureus or Pseudomonas seen Performed at [facility], [place] [address] [place], [place] [address]    Report Status 01/02/2021 FINAL  Final  MRSA PCR Screening     Status: None   Collection Time: 01/01/21  8:47 AM   Specimen: Nasal Mucosa; Nasopharyngeal  Result Value Ref Range Status   MRSA by PCR NEGATIVE NEGATIVE Final    Comment:        The GeneXpert MRSA Assay (FDA approved for NASAL specimens only), is one component of a comprehensive MRSA colonization surveillance program. It is not intended to diagnose MRSA infection nor to guide or monitor treatment for MRSA infections. Performed at [facility], [address] [place], [place] [address]   Expectorated sputum assessment w rflx to resp cult     Status: None   Collection Time: 01/04/21 10:40 AM   Specimen: Expectorated Sputum  Result Value Ref Range Status   Specimen Description EXPECTORATED SPUTUM  Final   Special Requests NONE  Final   Sputum evaluation   Final    Sputum specimen not acceptable for testing.  Please recollect.   NOTIFIED CHLOE ALLEN FOR RECOLLECT ON 01/04/21 AT 1153 QSD Performed at [facility], [address]  [place],  [address]    Report Status 01/04/2021 FINAL  Final     Radiology Studies: No results found.  Scheduled Meds: . amiodarone  200 mg Oral Daily  . vitamin C  500 mg Oral  Daily  . Chlorhexidine Gluconate Cloth  6 each Topical Q2200  . cholecalciferol  1,000 Units Oral Daily  . enoxaparin (LOVENOX) injection  40 mg Subcutaneous Q24H  . famotidine  20 mg Oral BID  . finasteride  5 mg Oral Daily  . guaiFENesin  600 mg Oral BID  . insulin aspart  0-15 Units Subcutaneous TID WC  . insulin aspart  0-5 Units Subcutaneous QHS  . insulin glargine  10 Units Subcutaneous BID  . Ipratropium-Albuterol  1 puff Inhalation Q6H  . levothyroxine  100 mcg Oral Daily  . mouth rinse  15 mL Mouth Rinse BID  . methylPREDNISolone (SOLU-MEDROL) injection  60 mg Intravenous Q12H  . spironolactone  25 mg Oral BID  . tamsulosin  0.8 mg Oral Daily  . traZODone  50 mg Oral QHS  . zinc sulfate  220 mg Oral Daily   Continuous Infusions: . sodium chloride 5 mL/hr at 01/06/21 0800  . azithromycin Stopped (01/05/21 1902)  . cefTRIAXone (ROCEPHIN)  IV Stopped (01/05/21 1752)     LOS: 9 days     Enzo Bi, MD [facility]  If 7PM-7AM, please contact night-coverage [email or web address]  01/06/2021, 5:01 PM

## 2021-01-06 NOTE — Progress Notes (Signed)
Physical Therapy Treatment Patient Details Name: Nathaniel Ibarra MRN: [id number] DOB: 1941/07/15 Today's Date: 01/06/2021    History of Present Illness Nathaniel Ibarra  is a 80 y.o. Caucasian male with a known history of hypertension, coronary artery disease, status post PCI and stent, paroxysmal atrial fibrillation, CHF, BPH and hypothyroidism, who presented to the emergency room with acute onset of worsening cough with dyspnea.    PT Comments    Pt is making gradual progress towards goals, however is very weak demonstrating OOB mobility. O2 sats sharply decline and takes extended time for recovery. Pt is also limited due to lethargy, unsure how much this is affecting functional status. Based on treatment today, will need to update recs to SNF. Encouraged to get up to chair with RN and perform IS reps. Will continue to progress.   Follow Up Recommendations  SNF     Equipment Recommendations  Rolling walker with 5" wheels    Recommendations for Other Services       Precautions / Restrictions Precautions Precautions: Fall Restrictions Weight Bearing Restrictions: No    Mobility  Bed Mobility Overal bed mobility: Needs Assistance Bed Mobility: Supine to Sit     Supine to sit: Min guard     General bed mobility comments: safe technique. Once seated at EOB, upright posture noted. O2 sats at 95% once seated at EOB.  Transfers Overall transfer level: Needs assistance Equipment used: None Transfers: Sit to/from Stand Sit to Stand: Min assist         General transfer comment: very unsteady, needing more assist this date  Ambulation/Gait Ambulation/Gait assistance: Mod assist Gait Distance (Feet): 5 Feet Assistive device: None Gait Pattern/deviations: Step-to pattern     General Gait Details: Unsteady and forward flexed posture. Would benefit from AD. Very fatigued. O2 sats decrease to 78% with transfer. Takes extended time for recovery to 90% on 40L of  HFNC   Stairs             Wheelchair Mobility    Modified Rankin (Stroke Patients Only)       Balance Overall balance assessment: Needs assistance Sitting-balance support: Feet supported Sitting balance-Leahy Scale: Good     Standing balance support: Single extremity supported;During functional activity Standing balance-Leahy Scale: Fair                              Cognition Arousal/Alertness: Awake/alert Behavior During Therapy: WFL for tasks assessed/performed Overall Cognitive Status: Within Functional Limits for tasks assessed                                        Exercises Other Exercises Other Exercises: supine ther-ex performed on B LE including quad sets, heel slides, SLRs, and seated LAQ. All ther-ex performed x 10 reps with supervision. O2 sats decreased to 81% with exertion, taking increased time for recovery. 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: A Lot Help needed standing up from a chair using your arms  (e.g., wheelchair or bedside chair)?: A Lot Help needed to walk in hospital room?: A Lot Help needed climbing 3-5 steps with a railing? : Total 6 Click Score: 13    End of Session Equipment Utilized During Treatment: Gait belt Activity Tolerance: Treatment limited secondary to medical complications (Comment) Patient left: in chair;with chair alarm set Nurse Communication: Mobility status PT Visit Diagnosis: Unsteadiness on feet (R26.81);Other abnormalities of gait and mobility (R26.89);Muscle weakness (generalized) (M62.81)     Time: 9833-8250 PT Time Calculation (min) (ACUTE ONLY): 38 min  Charges:  $Gait Training: 8-22 mins $Therapeutic Exercise: 23-37 mins                     Nathaniel Ibarra, [place], DPT [phone]    Nathaniel Ibarra 01/06/2021, 1:12 PM

## 2021-01-07 LAB — CBC
HCT: 38.6 % — ABNORMAL LOW (ref 39.0–52.0)
Hemoglobin: 12.7 g/dL — ABNORMAL LOW (ref 13.0–17.0)
MCH: 30.5 pg (ref 26.0–34.0)
MCHC: 32.9 g/dL (ref 30.0–36.0)
MCV: 92.8 fL (ref 80.0–100.0)
Platelets: 290 10*3/uL (ref 150–400)
RBC: 4.16 MIL/uL — ABNORMAL LOW (ref 4.22–5.81)
RDW: 13.6 % (ref 11.5–15.5)
WBC: 28.2 10*3/uL — ABNORMAL HIGH (ref 4.0–10.5)
nRBC: 0 % (ref 0.0–0.2)

## 2021-01-07 LAB — C-REACTIVE PROTEIN: CRP: 0.5 mg/dL (ref <1.0)

## 2021-01-07 LAB — BASIC METABOLIC PANEL
Anion gap: 8 (ref 5–15)
BUN: 33 mg/dL — ABNORMAL HIGH (ref 8–23)
CO2: 33 mmol/L — ABNORMAL HIGH (ref 22–32)
Calcium: 8.2 mg/dL — ABNORMAL LOW (ref 8.9–10.3)
Chloride: 98 mmol/L (ref 98–111)
Creatinine, Ser: 0.85 mg/dL (ref 0.61–1.24)
GFR, Estimated: >60 mL/min (ref >60)
Glucose, Bld: 161 mg/dL — ABNORMAL HIGH (ref 70–99)
Potassium: 5.1 mmol/L (ref 3.5–5.1)
Sodium: 139 mmol/L (ref 135–145)

## 2021-01-07 LAB — GLUCOSE, CAPILLARY
Glucose-Capillary: 168 mg/dL — ABNORMAL HIGH (ref 70–99)
Glucose-Capillary: 209 mg/dL — ABNORMAL HIGH (ref 70–99)
Glucose-Capillary: 258 mg/dL — ABNORMAL HIGH (ref 70–99)
Glucose-Capillary: 268 mg/dL — ABNORMAL HIGH (ref 70–99)

## 2021-01-07 LAB — MAGNESIUM: Magnesium: 2.8 mg/dL — ABNORMAL HIGH (ref 1.7–2.4)

## 2021-01-07 MED ORDER — DEXAMETHASONE 6 MG PO TABS
6.0000 mg | ORAL_TABLET | Freq: Every day | ORAL | Status: DC
  Administered 2021-01-08 – 2021-01-13 (×6): 6 mg via ORAL
  Filled 2021-01-07 (×8): qty 1

## 2021-01-07 NOTE — TOC Progression Note (Signed)
Transition of Care [facility]) - Progression Note    Patient Details  Name: Nathaniel Ibarra MRN: [id number] Date of Birth: 1941/05/28  Transition of Care [facility]) CM/SW [place], LCSW Phone Number: 01/07/2021, 1:54 PM  Clinical Narrative:  Pt getting screened for [facility] by Delsa Sale with [facility].           Expected Discharge Plan and Services                                                 Social Determinants of Health (SDOH) Interventions    Readmission Risk Interventions Readmission Risk Prevention Plan 11/24/2020  Post Dischage Appt Complete  Medication Screening Complete  Transportation Screening Complete  Some recent data might be hidden

## 2021-01-07 NOTE — Progress Notes (Signed)
PROGRESS NOTE    Nathaniel Ibarra  VVO:[id number] DOB: 1941-10-18 DOA: 12/28/2020 PCP: [name], MD   Brief Narrative: Taken from H&P Nathaniel Ibarra  is a 80 y.o. Caucasian male with a known history of hypertension, coronary artery disease, status post PCI and stent, paroxysmal atrial fibrillation, CHF, BPH and hypothyroidism, who presented to the emergency room with acute onset of worsening cough with dyspnea.  His symptoms started about a week and a half ago with sore throat followed by dry cough occasionally productive of whitish and greenish sputum.  He denies any fever chills.  He admitted to significant diminished sense of taste and smell.  He has been having fatigue and tiredness.  He denied any loss of appetite.  No nausea or vomiting or diarrhea.  He has been exposed to a friend with COVID prior to his symptoms.  His grandson and his wife tested positive yesterday for COVID-19 and their symptoms started 5 days ago. COVID-19 PCR was positive, chest x-ray with bilateral infiltrate consistent with atypical pneumonia/pulmonary edema. He was hypoxic in high 80s requiring 2 to 4 L of oxygen initially currently on 6 L. He was started on remdesivir, steroid and baricitinib. Baricitinib discontinued due to worsening leukocytosis.  patient is vaccinated with 2 doses and no booster. Patient desaturated later in the day requiring nonrebreather, 1 dose of Actemra given.  CTA ordered for today although he is on Eliquis, which was negative for PE although films were little motion degraded. Worsening oxygen requirement today-transitioning to heated high flow. Received 2 doses of Actemra, completed the course of remdesivir. He completed the course of antibiotics, which was started due to worsening leukocytosis.  MRSA PCR negative. Remained on maximum setting of heated high flow today. Complaining of hemoptysis, discontinuing Eliquis and a pulmonary consult, concern of alveolar  hemorrhage. Patient with worsening hypoxia, hemoptysis seems improving, transferred to stepdown.  Subjective: Pt reported feeling better.  O2 requirement improved some.  Normal oral intake, urination and BM.   Assessment & Plan:   Active Problems:   Pneumonia due to COVID-19 virus  Acute hypoxic respiratory failure secondary to COVID-19 pneumonia. Patient with neutrophilic predominant leukocytosis, procalcitonin remain negative, markedly elevated CRP, D-dimer trending up, peaked second time.  Completed the course of remdesivir and received 2 doses of Actemra. Sputum culture with normal respiratory flora. CTA was done due to worsening D-dimer and it was negative for PE, patient was on Eliquis at that time.  CRP has been normalized. Developed hemoptysis, holding Eliquis for the past 3 days, hemoptysis improved.  He was transferred to stepdown as he was high risk for PE and alveolar hemorrhage.  Worsening leukocytosis which did show mild improvement, procalcitonin remain negative.  MRSA PCR negative -Pulmonary consult-appreciate their help. Plan: --d/c empiric ceftriaxone and azithromycin  --taper steroid from solumedrol to oral decadron today since CRP has been <1 for 4 days --hold spironolactone due to potassium trending up -Continue with supplemental oxygen to keep the saturation above 90%. --cont combivent scheduled  Paroxysmal atrial fibrillation.   Currently rate well controlled.   Plan: --cont amiodarone --Hold Eliquis due to recent hemoptysis  Hypertension.   --hold spironolactone due to potassium trending up  Hypothyroidism. --cont home Synthroid  Hyperglycemia 2/2 steroid use No prior diagnosis of diabetes, most likely steroid-induced. -A1c- 5.5 he is not diabetic Plan: --cont Lantus 10u BID --SSI  BPH. -cont home Flomax and Proscar  Depression.   Paxil was listed in his chart but apparently patient stopped taking  it.   Objective: Vitals:   01/07/21 0537  01/07/21 0747 01/07/21 0831 01/07/21 1258  BP: (!) 155/58 (!) 176/70  (!) 150/60  Pulse: (!) 48 (!) 51  (!) 52  Resp: 19 (!) 22  (!) 22  Temp: (!) 97.5 F (36.4 C) 98.5 F (36.9 C)  98.1 F (36.7 C)  TempSrc: Oral Oral  Oral  SpO2: 97% 94% 90% 94%  Weight:      Height:        Intake/Output Summary (Last 24 hours) at 01/07/2021 1558 Last data filed at 01/07/2021 [id number] Gross per 24 hour  Intake --  Output 500 ml  Net -500 ml   Filed Weights   01/03/21 0500 01/03/21 1127 01/07/21 0222  Weight: 84.4 kg 86.8 kg 88.4 kg    Examination:  Constitutional: NAD, AAOx3 HEENT: conjunctivae and lids normal, EOMI CV: No cyanosis.   RESP: good air movement, no distress, on heated hf 75% 40L Extremities: No effusions, edema in BLE SKIN: warm, dry Neuro: II - XII grossly intact.   Psych: Normal mood and affect.  Appropriate judgement and reason   DVT prophylaxis: SCDs Code Status: Full Family Communication: updated wife on the phone today Disposition Plan:  Status is: Inpatient  Remains inpatient appropriate because:Inpatient level of care appropriate due to severity of illness   Dispo: The patient is from: Home              Anticipated d/c is to: Home              Anticipated d/c date is: >3 days              Patient currently is not medically stable to d/c.  Still on heated hf.   Consultants:   Pulmonology  Procedures:  Antimicrobials:  Ceftriaxone Zithromax  Data Reviewed: I have personally reviewed following labs and imaging studies  CBC: Recent Labs  Lab 01/01/21 0248 01/02/21 0509 01/03/21 0407 01/04/21 0304 01/05/21 0706 01/06/21 0322 01/07/21 0446  WBC 20.4* 24.1* 27.8* 27.6* 25.8* 27.9* 28.2*  NEUTROABS 18.4* 21.6*  --   --   --   --   --   HGB 12.0* 12.4* 13.2 13.1 13.0 13.2 12.7*  HCT 35.5* 38.5* 39.2 38.2* 38.8* 39.2 38.6*  MCV 91.7 93.0 92.2 90.5 90.9 91.2 92.8  PLT 387 371 374 364 351 313 [id number]   Basic Metabolic Panel: Recent Labs  Lab  01/03/21 0407 01/04/21 0304 01/05/21 0706 01/06/21 0322 01/07/21 0446  NA 142 141 141 140 139  K 4.3 4.0 4.4 4.0 5.1  CL 103 97* 97* 97* 98  CO2 30 33* 32 33* 33*  GLUCOSE 223* 155* 142* 133* 161*  BUN 32* 35* 37* 37* 33*  CREATININE 0.80 0.96 0.85 0.95 0.85  CALCIUM 8.5* 8.3* 8.6* 8.4* 8.2*  MG  --  2.4  --   --  2.8*  PHOS  --  3.9  --   --   --    GFR: Estimated Creatinine Clearance: 77.3 mL/min (by C-G formula based on SCr of 0.85 mg/dL). Liver Function Tests: Recent Labs  Lab 01/02/21 0509 01/03/21 0407 01/04/21 0304 01/05/21 0706 01/06/21 0322  AST 30 24 20 19 22   ALT 43 40 35 27 29  ALKPHOS 89 106 96 90 90  BILITOT 0.6 0.6 0.7 0.7 0.7  PROT 5.3* 5.4* 5.4* 5.3* 5.3*  ALBUMIN 2.6* 2.9* 3.0* 3.0* 3.0*   No results for input(s): LIPASE, AMYLASE in the last 168 hours.  No results for input(s): AMMONIA in the last 168 hours. Coagulation Profile: Recent Labs  Lab 01/03/21 1921 01/04/21 0304  INR 1.3* 1.3*   Cardiac Enzymes: No results for input(s): CKTOTAL, CKMB, CKMBINDEX, TROPONINI in the last 168 hours. BNP (last 3 results) No results for input(s): PROBNP in the last 8760 hours. HbA1C: No results for input(s): HGBA1C in the last 72 hours. CBG: Recent Labs  Lab 01/06/21 1228 01/06/21 1646 01/06/21 2156 01/07/21 0744 01/07/21 1219  GLUCAP 220* 154* 381* 168* 209*   Lipid Profile: No results for input(s): CHOL, HDL, LDLCALC, TRIG, CHOLHDL, LDLDIRECT in the last 72 hours. Thyroid Function Tests: No results for input(s): TSH, T4TOTAL, FREET4, T3FREE, THYROIDAB in the last 72 hours. Anemia Panel: No results for input(s): VITAMINB12, FOLATE, FERRITIN, TIBC, IRON, RETICCTPCT in the last 72 hours. Sepsis Labs: Recent Labs  Lab 01/04/21 0304  PROCALCITON <0.10    Recent Results (from the past 240 hour(s))  Culture, sputum-assessment     Status: None   Collection Time: 12/30/20  9:59 AM   Specimen: Sputum  Result Value Ref Range Status   Specimen  Description SPUTUM  Final   Special Requests NONE  Final   Sputum evaluation   Final    THIS SPECIMEN IS ACCEPTABLE FOR SPUTUM CULTURE Performed at [facility], [address], [place], [place] [address]    Report Status 12/30/2020 FINAL  Final  Culture, respiratory     Status: None   Collection Time: 12/30/20  9:59 AM   Specimen: SPU  Result Value Ref Range Status   Specimen Description   Final    SPUTUM Performed at [facility], [address] [place], [place] [address]    Special Requests   Final    NONE Reflexed from [id number] Performed at [facility], [address] [place], [place] [address]    Gram Stain   Final    RARE WBC PRESENT, PREDOMINANTLY PMN FEW GRAM POSITIVE COCCI IN CHAINS RARE GRAM POSITIVE RODS    Culture   Final    FEW Normal respiratory flora-no Staph aureus or Pseudomonas seen Performed at [facility], [place] [address] [place], [place] [address]    Report Status 01/02/2021 FINAL  Final  MRSA PCR Screening     Status: None   Collection Time: 01/01/21  8:47 AM   Specimen: Nasal Mucosa; Nasopharyngeal  Result Value Ref Range Status   MRSA by PCR NEGATIVE NEGATIVE Final    Comment:        The GeneXpert MRSA Assay (FDA approved for NASAL specimens only), is one component of a comprehensive MRSA colonization surveillance program. It is not intended to diagnose MRSA infection nor to guide or monitor treatment for MRSA infections. Performed at [facility], [address] [place], [place] [address]   Expectorated sputum assessment w rflx to resp cult     Status: None   Collection Time: 01/04/21 10:40 AM   Specimen: Expectorated Sputum  Result Value Ref Range Status   Specimen Description EXPECTORATED SPUTUM  Final   Special Requests NONE  Final   Sputum evaluation   Final    Sputum specimen not acceptable for testing.  Please recollect.   NOTIFIED CHLOE ALLEN FOR RECOLLECT ON 01/04/21 AT 1153  QSD Performed at [facility], [address], [place], [place] [address]    Report Status 01/04/2021 FINAL  Final     Radiology Studies: No results found.  Scheduled Meds: . amiodarone  200 mg Oral Daily  . vitamin C  500 mg Oral Daily  . cholecalciferol  1,000 Units Oral Daily  . dexamethasone  6 mg Oral Daily  . enoxaparin (LOVENOX) injection  40 mg Subcutaneous Q24H  . famotidine  20 mg Oral BID  . finasteride  5 mg Oral Daily  . guaiFENesin  600 mg Oral BID  . insulin aspart  0-15 Units Subcutaneous TID WC  . insulin aspart  0-5 Units Subcutaneous QHS  . insulin glargine  10 Units Subcutaneous BID  . Ipratropium-Albuterol  1 puff Inhalation Q6H  . levothyroxine  100 mcg Oral Daily  . mouth rinse  15 mL Mouth Rinse BID  . spironolactone  25 mg Oral BID  . tamsulosin  0.8 mg Oral Daily  . traZODone  50 mg Oral QHS  . zinc sulfate  220 mg Oral Daily   Continuous Infusions: . sodium chloride 5 mL/hr at 01/06/21 0800     LOS: 10 days     Enzo Bi, MD [facility]  If 7PM-7AM, please contact night-coverage [email or web address]  01/07/2021, 3:58 PM

## 2021-01-08 LAB — BASIC METABOLIC PANEL
Anion gap: 8 (ref 5–15)
BUN: 31 mg/dL — ABNORMAL HIGH (ref 8–23)
CO2: 32 mmol/L (ref 22–32)
Calcium: 8.2 mg/dL — ABNORMAL LOW (ref 8.9–10.3)
Chloride: 99 mmol/L (ref 98–111)
Creatinine, Ser: 0.86 mg/dL (ref 0.61–1.24)
GFR, Estimated: >60 mL/min (ref >60)
Glucose, Bld: 179 mg/dL — ABNORMAL HIGH (ref 70–99)
Potassium: 4.8 mmol/L (ref 3.5–5.1)
Sodium: 139 mmol/L (ref 135–145)

## 2021-01-08 LAB — CBC
HCT: 41.3 % (ref 39.0–52.0)
Hemoglobin: 13.7 g/dL (ref 13.0–17.0)
MCH: 31.1 pg (ref 26.0–34.0)
MCHC: 33.2 g/dL (ref 30.0–36.0)
MCV: 93.9 fL (ref 80.0–100.0)
Platelets: 306 10*3/uL (ref 150–400)
RBC: 4.4 MIL/uL (ref 4.22–5.81)
RDW: 13.8 % (ref 11.5–15.5)
WBC: 33.4 10*3/uL — ABNORMAL HIGH (ref 4.0–10.5)
nRBC: 0 % (ref 0.0–0.2)

## 2021-01-08 LAB — GLUCOSE, CAPILLARY
Glucose-Capillary: 147 mg/dL — ABNORMAL HIGH (ref 70–99)
Glucose-Capillary: 123 mg/dL — ABNORMAL HIGH (ref 70–99)
Glucose-Capillary: 157 mg/dL — ABNORMAL HIGH (ref 70–99)
Glucose-Capillary: 222 mg/dL — ABNORMAL HIGH (ref 70–99)

## 2021-01-08 LAB — MAGNESIUM: Magnesium: 2.8 mg/dL — ABNORMAL HIGH (ref 1.7–2.4)

## 2021-01-08 LAB — C-REACTIVE PROTEIN: CRP: 0.7 mg/dL (ref <1.0)

## 2021-01-08 MED ORDER — APIXABAN 5 MG PO TABS
5.0000 mg | ORAL_TABLET | Freq: Two times a day (BID) | ORAL | Status: DC
  Administered 2021-01-08 – 2021-01-13 (×10): 5 mg via ORAL
  Filled 2021-01-08 (×10): qty 1

## 2021-01-08 MED ORDER — FUROSEMIDE 40 MG PO TABS
40.0000 mg | ORAL_TABLET | Freq: Every day | ORAL | Status: DC
  Administered 2021-01-09 – 2021-01-13 (×5): 40 mg via ORAL
  Filled 2021-01-08 (×5): qty 1

## 2021-01-08 NOTE — Progress Notes (Signed)
Occupational Therapy Treatment Patient Details Name: Nathaniel Ibarra MRN: [id number] DOB: 1941/05/14 Today's Date: 01/08/2021    History of present illness Curtiss Mahmood  is a 80 y.o. Caucasian male with a known history of hypertension, coronary artery disease, status post PCI and stent, paroxysmal atrial fibrillation, CHF, BPH and hypothyroidism, who presented to the emergency room with acute onset of worsening cough with dyspnea.   OT comments  Pt seen for OT treatment this date to f/u re: safety with ADLs/ADL mobility. OT facilitates pt particpation in UB/LB bathing with SETUP to MIBN A for UB, SETUP for donning clean gown and MOD A for LB bathing in standing for UE support as well as MIN/MOD A for sit/lateral lean to don clean socks. Pt tolerates well overall. Pt transfers to commode with RW with CGA/MIN A to take ~3-4 steps to/from bed/BSC. Pt requires MIN A for posterior peri care using sit<>stand technique. Pt on 30L at 70% HHFNC, tolerates better this date, slight de-sat to 88-89% with transfers, but able to mostly sustain >90% and only requiring ~30 second seated rest break to recover. Continue to recommend [place] f/u.    Follow Up Recommendations  Home health OT    Equipment Recommendations  3 in 1 bedside commode    Recommendations for Other Services      Precautions / Restrictions Precautions Precautions: Fall Restrictions Weight Bearing Restrictions: No       Mobility Bed Mobility Overal bed mobility: Needs Assistance Bed Mobility: Supine to Sit;Sit to Supine     Supine to sit: Supervision;HOB elevated Sit to supine: Supervision;HOB elevated   General bed mobility comments: increased time to manage LEs, but overall able to perform w/o physical assist with use of bed rails and HOB elevated  Transfers Overall transfer level: Needs assistance Equipment used: Rolling walker (2 wheeled) Transfers: Sit to/from [facility] Sit to Stand: Min  guard;Supervision Stand pivot transfers: Min guard;Min assist       General transfer comment: improved steadiness and tolerance this date.    Balance Overall balance assessment: Needs assistance Sitting-balance support: Feet supported Sitting balance-Leahy Scale: Good     Standing balance support: During functional activity;Bilateral upper extremity supported Standing balance-Leahy Scale: Fair Standing balance comment: benefits from UE support on RW, able to alternate UEs.                           ADL either performed or assessed with clinical judgement   ADL Overall ADL's : Needs assistance/impaired         Upper Body Bathing: Set up;Sitting   Lower Body Bathing: Minimal assistance;Moderate assistance;Sit to/from stand   Upper Body Dressing : Minimal assistance;Sitting   Lower Body Dressing: Minimal assistance;Sitting/lateral leans Lower Body Dressing Details (indicate cue type and reason): to don socks sitting Toilet Transfer: Min Geophysical data processor Details (indicate cue type and reason): cues for safety/pivoting/line awareness Toileting- Clothing Manipulation and Hygiene: Minimal assistance;Sit to/from stand Toileting - Clothing Manipulation Details (indicate cue type and reason): to/from [facility] with bath wipes     Functional mobility during ADLs: Min guard;Minimal assistance;Rolling walker (to take 2-3 shuffling steps to/from [facility])       Vision Patient Visual Report: No change from baseline     Perception     Praxis      Cognition Arousal/Alertness: Awake/alert Behavior During Therapy: WFL for tasks assessed/performed Overall Cognitive Status: Within Functional Limits for tasks assessed  General Comments: Pt alert and oriented, willing to participate with interventions today.        Exercises Other Exercises Other Exercises: OT facilitates pt particpation in UB/LB bathing with  SETUP to MIBN A for UB, SETUP for donning clean gown and MOD A for LB bathing in standing for UE support as well as MIN/MOD A for sit/lateral lean to don clean socks. Pt tolerates well overall. Pt transfers to commode with RW with CGA/MIN A to take ~3-4 steps to/from bed/BSC. Pt requires MIN A for posterior peri care using sit<>stand technique.   Shoulder Instructions       General Comments Pt on 30L at 70% HHFNC, tolerates better this date, slight de-sat to 88-89% with transfers, but able to mostly sustain >90% and only requiring ~30 second seated rest break to recover.    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 OT Goal Formulation: With patient Time For Goal Achievement: 01/18/21 Potential to Achieve Goals: Good  Plan Discharge plan remains appropriate;Frequency remains appropriate    Co-evaluation                 AM-PAC OT "6 Clicks" Daily Activity     Outcome Measure   Help from another person eating meals?: None Help from another person taking care of personal grooming?: A Little Help from another person toileting, which includes using toliet, bedpan, or urinal?: A Little Help from another person bathing (including washing, rinsing, drying)?: A Little Help from another person to put on and taking off regular upper body clothing?: None Help from another person to put on and taking off regular lower body clothing?: A Little 6 Click Score: 20    End of Session Equipment Utilized During Treatment: Rolling walker;Oxygen  OT Visit Diagnosis: Other abnormalities of gait and  mobility (R26.89)   Activity Tolerance Patient tolerated treatment well   Patient Left in bed;with call bell/phone within reach   Nurse Communication Mobility status        Time: 0160-1093 OT Time Calculation (min): 38 min  Charges: OT General Charges $OT Visit: 1 Visit OT Treatments $Self Care/Home Management : 23-37 mins $Therapeutic Activity: 8-22 mins  Gerrianne Scale, [place], OTR/L ascom [phone] 01/08/21, 3:53 PM

## 2021-01-08 NOTE — Progress Notes (Signed)
PROGRESS NOTE    [name]  [id number] DOB: May 28, 1941 DOA: 12/28/2020 PCP: [name], MD   Brief Narrative: Taken from H&P Nathaniel Ibarra  is a 80 y.o. Caucasian male with a known history of hypertension, coronary artery disease, status post PCI and stent, paroxysmal atrial fibrillation, CHF, BPH and hypothyroidism, who presented to the emergency room with acute onset of worsening cough with dyspnea.  His symptoms started about a week and a half ago with sore throat followed by dry cough occasionally productive of whitish and greenish sputum.  He denies any fever chills.  He admitted to significant diminished sense of taste and smell.  He has been having fatigue and tiredness.  He denied any loss of appetite.  No nausea or vomiting or diarrhea.  He has been exposed to a friend with COVID prior to his symptoms.  His grandson and his wife tested positive yesterday for COVID-19 and their symptoms started 5 days ago. COVID-19 PCR was positive, chest x-ray with bilateral infiltrate consistent with atypical pneumonia/pulmonary edema. He was hypoxic in high 80s requiring 2 to 4 L of oxygen initially currently on 6 L. He was started on remdesivir, steroid and baricitinib. Baricitinib discontinued due to worsening leukocytosis.  patient is vaccinated with 2 doses and no booster. Patient desaturated later in the day requiring nonrebreather, 1 dose of Actemra given.  CTA ordered for today although he is on Eliquis, which was negative for PE although films were little motion degraded. Worsening oxygen requirement today-transitioning to heated high flow. Received 2 doses of Actemra, completed the course of remdesivir. He completed the course of antibiotics, which was started due to worsening leukocytosis.  MRSA PCR negative. Remained on maximum setting of heated high flow today. Complaining of hemoptysis, discontinuing Eliquis and a pulmonary consult, concern of alveolar  hemorrhage. Patient with worsening hypoxia, hemoptysis seems improving, transferred to stepdown.  Subjective: Pt reported doing better.  Eating ok.  No N/V/D.  No more hemoptysis.     Assessment & Plan:   Active Problems:   Pneumonia due to COVID-19 virus  Acute hypoxic respiratory failure secondary to COVID-19 pneumonia. Patient with neutrophilic predominant leukocytosis, procalcitonin remain negative, markedly elevated CRP, D-dimer trending up, peaked second time.  Completed the course of remdesivir and received 2 doses of Actemra. Sputum culture with normal respiratory flora. CTA was done due to worsening D-dimer and it was negative for PE, patient was on Eliquis at that time.  CRP has been normalized. Developed hemoptysis, holding Eliquis for the past 3 days, hemoptysis improved.  He was transferred to stepdown as he was high risk for PE and alveolar hemorrhage.  Worsening leukocytosis which did show mild improvement, procalcitonin remain negative.  MRSA PCR negative -Pulmonary consult-appreciate their help. --completed empiric ceftriaxone and azithromycin Plan: --cont oral decadron --hold spironolactone due to potassium trending up -Continue with supplemental oxygen to keep the saturation above 90%. --cont combivent scheduled  Paroxysmal atrial fibrillation.   Currently rate well controlled.   Plan: --cont amiodarone --resume Eliquis today since hemoptysis has resolved  Hypertension.   --hold spironolactone due to potassium trending up --Hold home lasix and losartan  Hypothyroidism. --cont home Synthroid  Hyperglycemia 2/2 steroid use No prior diagnosis of diabetes, most likely steroid-induced. -A1c- 5.5 he is not diabetic Plan: --cont Lantus 10u BID --SSI  BPH. -cont home Flomax and Proscar  Depression.   Paxil was listed in his chart but apparently patient stopped taking it.   Objective: Vitals:   01/08/21  [id number] 01/08/21 0933 01/08/21 1310 01/08/21 1406   BP:  (!) 146/58 (!) 131/51   Pulse:  (!) 51 (!) 52   Resp:  18 18   Temp:  98.4 F (36.9 C) 98.4 F (36.9 C)   TempSrc:   Oral   SpO2: 92% 95% 96% 96%  Weight:      Height:        Intake/Output Summary (Last 24 hours) at 01/08/2021 1553 Last data filed at 01/08/2021 0617 Gross per 24 hour  Intake --  Output 700 ml  Net -700 ml   Filed Weights   01/03/21 1127 01/07/21 0222 01/08/21 0250  Weight: 86.8 kg 88.4 kg 80 kg    Examination:  Constitutional: NAD, AAOx3 HEENT: conjunctivae and lids normal, EOMI CV: No cyanosis.   RESP: no distress, on heated hf, 75% and 40L Extremities: No effusions, edema in BLE SKIN: warm, dry Neuro: II - XII grossly intact.   Psych: Normal mood and affect.  Appropriate judgement and reason    DVT prophylaxis: SCDs Code Status: Full Family Communication:  Disposition Plan:  Status is: Inpatient  Remains inpatient appropriate because:Inpatient level of care appropriate due to severity of illness   Dispo: The patient is from: Home              Anticipated d/c is to: Home              Anticipated d/c date is: >3 days              Patient currently is not medically stable to d/c.  Still on heated hf.   Consultants:   Pulmonology  Procedures:  Antimicrobials:  Ceftriaxone Zithromax  Data Reviewed: I have personally reviewed following labs and imaging studies  CBC: Recent Labs  Lab 01/02/21 0509 01/03/21 0407 01/04/21 0304 01/05/21 0706 01/06/21 0322 01/07/21 0446 01/08/21 0511  WBC 24.1*   < > 27.6* 25.8* 27.9* 28.2* 33.4*  NEUTROABS 21.6*  --   --   --   --   --   --   HGB 12.4*   < > 13.1 13.0 13.2 12.7* 13.7  HCT 38.5*   < > 38.2* 38.8* 39.2 38.6* 41.3  MCV 93.0   < > 90.5 90.9 91.2 92.8 93.9  PLT 371   < > 364 351 313 290 306   < > = values in this interval not displayed.   Basic Metabolic Panel: Recent Labs  Lab 01/04/21 0304 01/05/21 0706 01/06/21 0322 01/07/21 0446 01/08/21 0511  NA 141 141 140 139 139   K 4.0 4.4 4.0 5.1 4.8  CL 97* 97* 97* 98 99  CO2 33* 32 33* 33* 32  GLUCOSE 155* 142* 133* 161* 179*  BUN 35* 37* 37* 33* 31*  CREATININE 0.96 0.85 0.95 0.85 0.86  CALCIUM 8.3* 8.6* 8.4* 8.2* 8.2*  MG 2.4  --   --  2.8* 2.8*  PHOS 3.9  --   --   --   --    GFR: Estimated Creatinine Clearance: 76.4 mL/min (by C-G formula based on SCr of 0.86 mg/dL). Liver Function Tests: Recent Labs  Lab 01/02/21 0509 01/03/21 0407 01/04/21 0304 01/05/21 0706 01/06/21 0322  AST 30 24 20 19 22   ALT 43 40 35 27 29  ALKPHOS 89 106 96 90 90  BILITOT 0.6 0.6 0.7 0.7 0.7  PROT 5.3* 5.4* 5.4* 5.3* 5.3*  ALBUMIN 2.6* 2.9* 3.0* 3.0* 3.0*   No results for input(s): LIPASE, AMYLASE in  the last 168 hours. No results for input(s): AMMONIA in the last 168 hours. Coagulation Profile: Recent Labs  Lab 01/03/21 1921 01/04/21 0304  INR 1.3* 1.3*   Cardiac Enzymes: No results for input(s): CKTOTAL, CKMB, CKMBINDEX, TROPONINI in the last 168 hours. BNP (last 3 results) No results for input(s): PROBNP in the last 8760 hours. HbA1C: No results for input(s): HGBA1C in the last 72 hours. CBG: Recent Labs  Lab 01/07/21 1219 01/07/21 1644 01/07/21 2208 01/08/21 0916 01/08/21 1306  GLUCAP 209* 258* 268* 147* 123*   Lipid Profile: No results for input(s): CHOL, HDL, LDLCALC, TRIG, CHOLHDL, LDLDIRECT in the last 72 hours. Thyroid Function Tests: No results for input(s): TSH, T4TOTAL, FREET4, T3FREE, THYROIDAB in the last 72 hours. Anemia Panel: No results for input(s): VITAMINB12, FOLATE, FERRITIN, TIBC, IRON, RETICCTPCT in the last 72 hours. Sepsis Labs: Recent Labs  Lab 01/04/21 0304  PROCALCITON <0.10    Recent Results (from the past 240 hour(s))  Culture, sputum-assessment     Status: None   Collection Time: 12/30/20  9:59 AM   Specimen: Sputum  Result Value Ref Range Status   Specimen Description SPUTUM  Final   Special Requests NONE  Final   Sputum evaluation   Final    THIS  SPECIMEN IS ACCEPTABLE FOR SPUTUM CULTURE Performed at [facility], [address], [place], [place] [address]    Report Status 12/30/2020 FINAL  Final  Culture, respiratory     Status: None   Collection Time: 12/30/20  9:59 AM   Specimen: SPU  Result Value Ref Range Status   Specimen Description   Final    SPUTUM Performed at [facility], [address], [place], [place] [address]    Special Requests   Final    NONE Reflexed from [id number] Performed at [facility], [address] [place], [place] [address]    Gram Stain   Final    RARE WBC PRESENT, PREDOMINANTLY PMN FEW GRAM POSITIVE COCCI IN CHAINS RARE GRAM POSITIVE RODS    Culture   Final    FEW Normal respiratory flora-no Staph aureus or Pseudomonas seen Performed at [facility], [place] [address] [place], [place] [address]    Report Status 01/02/2021 FINAL  Final  MRSA PCR Screening     Status: None   Collection Time: 01/01/21  8:47 AM   Specimen: Nasal Mucosa; Nasopharyngeal  Result Value Ref Range Status   MRSA by PCR NEGATIVE NEGATIVE Final    Comment:        The GeneXpert MRSA Assay (FDA approved for NASAL specimens only), is one component of a comprehensive MRSA colonization surveillance program. It is not intended to diagnose MRSA infection nor to guide or monitor treatment for MRSA infections. Performed at [facility], [address] [place], [place] [address]   Expectorated sputum assessment w rflx to resp cult     Status: None   Collection Time: 01/04/21 10:40 AM   Specimen: Expectorated Sputum  Result Value Ref Range Status   Specimen Description EXPECTORATED SPUTUM  Final   Special Requests NONE  Final   Sputum evaluation   Final    Sputum specimen not acceptable for testing.  Please recollect.   NOTIFIED CHLOE ALLEN FOR RECOLLECT ON 01/04/21 AT 1153 QSD Performed at [facility], [address] [place],  [address]    Report Status  01/04/2021 FINAL  Final     Radiology Studies: No results found.  Scheduled Meds: . amiodarone  200 mg Oral Daily  . vitamin C  500 mg Oral Daily  . cholecalciferol  1,000 Units Oral Daily  . dexamethasone  6 mg Oral Daily  . enoxaparin (LOVENOX) injection  40 mg Subcutaneous Q24H  . famotidine  20 mg Oral BID  . finasteride  5 mg Oral Daily  . guaiFENesin  600 mg Oral BID  . insulin aspart  0-15 Units Subcutaneous TID WC  . insulin aspart  0-5 Units Subcutaneous QHS  . insulin glargine  10 Units Subcutaneous BID  . Ipratropium-Albuterol  1 puff Inhalation Q6H  . levothyroxine  100 mcg Oral Daily  . mouth rinse  15 mL Mouth Rinse BID  . tamsulosin  0.8 mg Oral Daily  . traZODone  50 mg Oral QHS  . zinc sulfate  220 mg Oral Daily   Continuous Infusions: . sodium chloride 5 mL/hr at 01/06/21 0800     LOS: 11 days     Enzo Bi, MD [facility]  If 7PM-7AM, please contact night-coverage [email or web address]  01/08/2021, 3:53 PM

## 2021-01-08 NOTE — Plan of Care (Signed)
  Problem: Education: Goal: Knowledge of General Education information will improve Description: Including pain rating scale, medication(s)/side effects and non-pharmacologic comfort measures Outcome: Progressing   Problem: Clinical Measurements: Goal: Respiratory complications will improve Outcome: Progressing   Problem: Safety: Goal: Ability to remain free from injury will improve Outcome: Progressing   

## 2021-01-08 NOTE — Progress Notes (Signed)
PT Cancellation Note  Patient Details Name: Nathaniel Ibarra MRN: [id number] DOB: [birth year]   Cancelled Treatment:     PT attempt. Upon entering room, pt talking on phone. Unwilling to participate at this time requesting therapist return later. Acute PT will continue to follow and progress as able per POC.    Willette Pa 01/08/2021, 11:22 AM

## 2021-01-09 LAB — CBC
HCT: 38 % — ABNORMAL LOW (ref 39.0–52.0)
Hemoglobin: 12.6 g/dL — ABNORMAL LOW (ref 13.0–17.0)
MCH: 30.9 pg (ref 26.0–34.0)
MCHC: 33.2 g/dL (ref 30.0–36.0)
MCV: 93.1 fL (ref 80.0–100.0)
Platelets: 243 10*3/uL (ref 150–400)
RBC: 4.08 MIL/uL — ABNORMAL LOW (ref 4.22–5.81)
RDW: 14 % (ref 11.5–15.5)
WBC: 28.7 10*3/uL — ABNORMAL HIGH (ref 4.0–10.5)
nRBC: 0 % (ref 0.0–0.2)

## 2021-01-09 LAB — GLUCOSE, CAPILLARY
Glucose-Capillary: 126 mg/dL — ABNORMAL HIGH (ref 70–99)
Glucose-Capillary: 181 mg/dL — ABNORMAL HIGH (ref 70–99)
Glucose-Capillary: 122 mg/dL — ABNORMAL HIGH (ref 70–99)
Glucose-Capillary: 226 mg/dL — ABNORMAL HIGH (ref 70–99)

## 2021-01-09 LAB — BASIC METABOLIC PANEL
Anion gap: 6 (ref 5–15)
BUN: 31 mg/dL — ABNORMAL HIGH (ref 8–23)
CO2: 32 mmol/L (ref 22–32)
Calcium: 8.2 mg/dL — ABNORMAL LOW (ref 8.9–10.3)
Chloride: 102 mmol/L (ref 98–111)
Creatinine, Ser: 0.77 mg/dL (ref 0.61–1.24)
GFR, Estimated: >60 mL/min (ref >60)
Glucose, Bld: 153 mg/dL — ABNORMAL HIGH (ref 70–99)
Potassium: 5.2 mmol/L — ABNORMAL HIGH (ref 3.5–5.1)
Sodium: 140 mmol/L (ref 135–145)

## 2021-01-09 LAB — MAGNESIUM: Magnesium: 2.6 mg/dL — ABNORMAL HIGH (ref 1.7–2.4)

## 2021-01-09 NOTE — Plan of Care (Signed)
  Problem: Safety: Goal: Ability to remain free from injury will improve Outcome: Progressing   Problem: Education: Goal: Knowledge of risk factors and measures for prevention of condition will improve Outcome: Progressing   Problem: Respiratory: Goal: Will maintain a patent airway Outcome: Progressing Goal: Complications related to the disease process, condition or treatment will be avoided or minimized Outcome: Progressing

## 2021-01-09 NOTE — Progress Notes (Signed)
PROGRESS NOTE    Nathaniel Ibarra  JIR:[id number] DOB: 24-Jun-1941 DOA: 12/28/2020 PCP: [name], MD   Brief Narrative: Taken from H&P Nathaniel Ibarra  is a 80 y.o. Caucasian male with a known history of hypertension, coronary artery disease, status post PCI and stent, paroxysmal atrial fibrillation, CHF, BPH and hypothyroidism, who presented to the emergency room with acute onset of worsening cough with dyspnea.  His symptoms started about a week and a half ago with sore throat followed by dry cough occasionally productive of whitish and greenish sputum.  He denies any fever chills.  He admitted to significant diminished sense of taste and smell.  He has been having fatigue and tiredness.  He denied any loss of appetite.  No nausea or vomiting or diarrhea.  He has been exposed to a friend with COVID prior to his symptoms.  His grandson and his wife tested positive yesterday for COVID-19 and their symptoms started 5 days ago. COVID-19 PCR was positive, chest x-ray with bilateral infiltrate consistent with atypical pneumonia/pulmonary edema. He was hypoxic in high 80s requiring 2 to 4 L of oxygen initially currently on 6 L. He was started on remdesivir, steroid and baricitinib. Baricitinib discontinued due to worsening leukocytosis.  patient is vaccinated with 2 doses and no booster. Patient desaturated later in the day requiring nonrebreather, 1 dose of Actemra given.  CTA ordered for today although he is on Eliquis, which was negative for PE although films were little motion degraded. Worsening oxygen requirement today-transitioning to heated high flow. Received 2 doses of Actemra, completed the course of remdesivir. He completed the course of antibiotics, which was started due to worsening leukocytosis.  MRSA PCR negative. Remained on maximum setting of heated high flow today. Complaining of hemoptysis, discontinuing Eliquis and a pulmonary consult, concern of alveolar  hemorrhage. Patient with worsening hypoxia, hemoptysis seems improving, transferred to stepdown.  Subjective: Pt continued to improve.  O2 requirement going down.  No N/V/D.   Assessment & Plan:   Active Problems:   Pneumonia due to COVID-19 virus  Acute hypoxic respiratory failure secondary to COVID-19 pneumonia. Patient with neutrophilic predominant leukocytosis, procalcitonin remain negative, markedly elevated CRP, D-dimer trending up, peaked second time.  Completed the course of remdesivir and received 2 doses of Actemra. Sputum culture with normal respiratory flora. CTA was done due to worsening D-dimer and it was negative for PE, patient was on Eliquis at that time.  CRP has been normalized. Developed hemoptysis, holding Eliquis for the past 3 days, hemoptysis improved.  He was transferred to stepdown as he was high risk for PE and alveolar hemorrhage.  Worsening leukocytosis which did show mild improvement, procalcitonin remain negative.  MRSA PCR negative -Pulmonary consult-appreciate their help. --completed empiric ceftriaxone and azithromycin Plan: --cont oral decadron -Continue with supplemental oxygen to keep the saturation above 90%. --cont combivent scheduled  Paroxysmal atrial fibrillation.   Currently rate well controlled.   Plan: --cont amiodarone --cont Eliquis  Hypertension.   --hold spironolactone due to potassium trending up --resume home lasix today --Hold losartan  Hypothyroidism. --cont home Synthroid  Hyperglycemia 2/2 steroid use No prior diagnosis of diabetes, most likely steroid-induced. -A1c- 5.5 he is not diabetic Plan: --cont Lantus 10u BID --SSI  BPH. -cont home Flomax and Proscar  Depression.   Paxil was listed in his chart but apparently patient stopped taking it.  Hemoptysis, resolved In setting of pneumonia and anticoagulation --home Eliquis resumed.   Objective: Vitals:   01/09/21 7510 01/09/21 0901  01/09/21 1746  01/09/21 2049  BP: (!) 167/62  (!) 149/88 131/61  Pulse: (!) 48  60 (!) 58  Resp: 18  18 20   Temp: 97.8 F (36.6 C)  98.8 F (37.1 C) 98 F (36.7 C)  TempSrc: Oral  Oral Oral  SpO2: 99% 95%  94%  Weight:      Height:        Intake/Output Summary (Last 24 hours) at 01/10/2021 0053 Last data filed at 01/09/2021 1800 Gross per 24 hour  Intake 120 ml  Output 3500 ml  Net -3380 ml   Filed Weights   01/03/21 1127 01/07/21 0222 01/08/21 0250  Weight: 86.8 kg 88.4 kg 80 kg    Examination:  Constitutional: NAD, AAOx3 HEENT: conjunctivae and lids normal, EOMI CV: No cyanosis.   RESP: no distress, on heated hf, 65% and 40L Extremities: No effusions, edema in BLE SKIN: warm, dry Neuro: II - XII grossly intact.   Psych: Normal mood and affect.  Appropriate judgement and reason   DVT prophylaxis: SCDs Code Status: Full Family Communication: wife updated on the phone today Disposition Plan:  Status is: Inpatient  Dispo: The patient is from: Home              Anticipated d/c is to: Home              Anticipated d/c date is: >3 days              Patient currently is not medically stable to d/c.  Still on heated hf.   Consultants:   Pulmonology  Procedures:  Antimicrobials:  Ceftriaxone Zithromax  Data Reviewed: I have personally reviewed following labs and imaging studies  CBC: Recent Labs  Lab 01/05/21 0706 01/06/21 0322 01/07/21 0446 01/08/21 0511 01/09/21 0443  WBC 25.8* 27.9* 28.2* 33.4* 28.7*  HGB 13.0 13.2 12.7* 13.7 12.6*  HCT 38.8* 39.2 38.6* 41.3 38.0*  MCV 90.9 91.2 92.8 93.9 93.1  PLT 351 313 290 306 397   Basic Metabolic Panel: Recent Labs  Lab 01/04/21 0304 01/05/21 0706 01/06/21 0322 01/07/21 0446 01/08/21 0511 01/09/21 0443  NA 141 141 140 139 139 140  K 4.0 4.4 4.0 5.1 4.8 5.2*  CL 97* 97* 97* 98 99 102  CO2 33* 32 33* 33* 32 32  GLUCOSE 155* 142* 133* 161* 179* 153*  BUN 35* 37* 37* 33* 31* 31*  CREATININE 0.96 0.85 0.95 0.85  0.86 0.77  CALCIUM 8.3* 8.6* 8.4* 8.2* 8.2* 8.2*  MG 2.4  --   --  2.8* 2.8* 2.6*  PHOS 3.9  --   --   --   --   --    GFR: Estimated Creatinine Clearance: 82.2 mL/min (by C-G formula based on SCr of 0.77 mg/dL). Liver Function Tests: Recent Labs  Lab 01/03/21 0407 01/04/21 0304 01/05/21 0706 01/06/21 0322  AST 24 20 19 22   ALT 40 35 27 29  ALKPHOS 106 96 90 90  BILITOT 0.6 0.7 0.7 0.7  PROT 5.4* 5.4* 5.3* 5.3*  ALBUMIN 2.9* 3.0* 3.0* 3.0*   No results for input(s): LIPASE, AMYLASE in the last 168 hours. No results for input(s): AMMONIA in the last 168 hours. Coagulation Profile: Recent Labs  Lab 01/03/21 1921 01/04/21 0304  INR 1.3* 1.3*   Cardiac Enzymes: No results for input(s): CKTOTAL, CKMB, CKMBINDEX, TROPONINI in the last 168 hours. BNP (last 3 results) No results for input(s): PROBNP in the last 8760 hours. HbA1C: No results for input(s): HGBA1C  in the last 72 hours. CBG: Recent Labs  Lab 01/08/21 2056 01/09/21 0834 01/09/21 1115 01/09/21 1728 01/09/21 2050  GLUCAP 222* 126* 181* 122* 226*   Lipid Profile: No results for input(s): CHOL, HDL, LDLCALC, TRIG, CHOLHDL, LDLDIRECT in the last 72 hours. Thyroid Function Tests: No results for input(s): TSH, T4TOTAL, FREET4, T3FREE, THYROIDAB in the last 72 hours. Anemia Panel: No results for input(s): VITAMINB12, FOLATE, FERRITIN, TIBC, IRON, RETICCTPCT in the last 72 hours. Sepsis Labs: Recent Labs  Lab 01/04/21 0304  PROCALCITON <0.10    Recent Results (from the past 240 hour(s))  MRSA PCR Screening     Status: None   Collection Time: 01/01/21  8:47 AM   Specimen: Nasal Mucosa; Nasopharyngeal  Result Value Ref Range Status   MRSA by PCR NEGATIVE NEGATIVE Final    Comment:        The GeneXpert MRSA Assay (FDA approved for NASAL specimens only), is one component of a comprehensive MRSA colonization surveillance program. It is not intended to diagnose MRSA infection nor to guide or monitor  treatment for MRSA infections. Performed at [facility], [address] [place], [place] [address]   Expectorated sputum assessment w rflx to resp cult     Status: None   Collection Time: 01/04/21 10:40 AM   Specimen: Expectorated Sputum  Result Value Ref Range Status   Specimen Description EXPECTORATED SPUTUM  Final   Special Requests NONE  Final   Sputum evaluation   Final    Sputum specimen not acceptable for testing.  Please recollect.   NOTIFIED CHLOE ALLEN FOR RECOLLECT ON 01/04/21 AT 1153 QSD Performed at [facility], [address], [place], [place] [address]    Report Status 01/04/2021 FINAL  Final     Radiology Studies: No results found.  Scheduled Meds: . amiodarone  200 mg Oral Daily  . apixaban  5 mg Oral BID  . vitamin C  500 mg Oral Daily  . cholecalciferol  1,000 Units Oral Daily  . dexamethasone  6 mg Oral Daily  . famotidine  20 mg Oral BID  . finasteride  5 mg Oral Daily  . furosemide  40 mg Oral Daily  . guaiFENesin  600 mg Oral BID  . insulin aspart  0-15 Units Subcutaneous TID WC  . insulin aspart  0-5 Units Subcutaneous QHS  . insulin glargine  10 Units Subcutaneous BID  . Ipratropium-Albuterol  1 puff Inhalation Q6H  . levothyroxine  100 mcg Oral Daily  . mouth rinse  15 mL Mouth Rinse BID  . tamsulosin  0.8 mg Oral Daily  . traZODone  50 mg Oral QHS  . zinc sulfate  220 mg Oral Daily   Continuous Infusions: . sodium chloride 5 mL/hr at 01/06/21 0800     LOS: 13 days     Nathaniel Bi, MD [facility]  If 7PM-7AM, please contact night-coverage [email or web address]  01/10/2021, 12:53 AM

## 2021-01-10 DIAGNOSIS — E875 Hyperkalemia: Secondary | ICD-10-CM

## 2021-01-10 LAB — CBC
HCT: 39.3 % (ref 39.0–52.0)
Hemoglobin: 13.1 g/dL (ref 13.0–17.0)
MCH: 30.8 pg (ref 26.0–34.0)
MCHC: 33.3 g/dL (ref 30.0–36.0)
MCV: 92.3 fL (ref 80.0–100.0)
Platelets: 252 10*3/uL (ref 150–400)
RBC: 4.26 MIL/uL (ref 4.22–5.81)
RDW: 14.1 % (ref 11.5–15.5)
WBC: 30.9 10*3/uL — ABNORMAL HIGH (ref 4.0–10.5)
nRBC: 0 % (ref 0.0–0.2)

## 2021-01-10 LAB — BASIC METABOLIC PANEL
Anion gap: 8 (ref 5–15)
BUN: 31 mg/dL — ABNORMAL HIGH (ref 8–23)
CO2: 32 mmol/L (ref 22–32)
Calcium: 8.4 mg/dL — ABNORMAL LOW (ref 8.9–10.3)
Chloride: 98 mmol/L (ref 98–111)
Creatinine, Ser: 0.73 mg/dL (ref 0.61–1.24)
GFR, Estimated: >60 mL/min (ref >60)
Glucose, Bld: 137 mg/dL — ABNORMAL HIGH (ref 70–99)
Potassium: 5.2 mmol/L — ABNORMAL HIGH (ref 3.5–5.1)
Sodium: 138 mmol/L (ref 135–145)

## 2021-01-10 LAB — GLUCOSE, CAPILLARY
Glucose-Capillary: 113 mg/dL — ABNORMAL HIGH (ref 70–99)
Glucose-Capillary: 141 mg/dL — ABNORMAL HIGH (ref 70–99)
Glucose-Capillary: 170 mg/dL — ABNORMAL HIGH (ref 70–99)
Glucose-Capillary: 194 mg/dL — ABNORMAL HIGH (ref 70–99)

## 2021-01-10 LAB — MAGNESIUM: Magnesium: 2.5 mg/dL — ABNORMAL HIGH (ref 1.7–2.4)

## 2021-01-10 MED ORDER — SODIUM CHLORIDE 0.9% FLUSH
10.0000 mL | Freq: Two times a day (BID) | INTRAVENOUS | Status: DC
  Administered 2021-01-10 – 2021-01-13 (×6): 10 mL via INTRAVENOUS

## 2021-01-10 MED ORDER — FUROSEMIDE 10 MG/ML IJ SOLN
40.0000 mg | Freq: Once | INTRAMUSCULAR | Status: AC
  Administered 2021-01-10: 40 mg via INTRAVENOUS
  Filled 2021-01-10: qty 4

## 2021-01-10 NOTE — Progress Notes (Signed)
PROGRESS NOTE    Nathaniel Ibarra  [id number] DOB: 07/12/1941 DOA: 12/28/2020 PCP: [name], MD   Brief Narrative: Taken from H&P Nathaniel Ibarra  is a 80 y.o. Caucasian male with a known history of hypertension, coronary artery disease, status post PCI and stent, paroxysmal atrial fibrillation, CHF, BPH and hypothyroidism, who presented to the emergency room with acute onset of worsening cough with dyspnea.  His symptoms started about a week and a half ago with sore throat followed by dry cough occasionally productive of whitish and greenish sputum.  He denies any fever chills.  He admitted to significant diminished sense of taste and smell.  He has been having fatigue and tiredness.  He denied any loss of appetite.  No nausea or vomiting or diarrhea.  He has been exposed to a friend with COVID prior to his symptoms.  His grandson and his wife tested positive yesterday for COVID-19 and their symptoms started 5 days ago. COVID-19 PCR was positive, chest x-ray with bilateral infiltrate consistent with atypical pneumonia/pulmonary edema. He was hypoxic in high 80s requiring 2 to 4 L of oxygen initially currently on 6 L. He was started on remdesivir, steroid and baricitinib. Baricitinib discontinued due to worsening leukocytosis.  patient is vaccinated with 2 doses and no booster. Patient desaturated later in the day requiring nonrebreather, 1 dose of Actemra given.  CTA ordered for today although he is on Eliquis, which was negative for PE although films were little motion degraded. Worsening oxygen requirement today-transitioning to heated high flow. Received 2 doses of Actemra, completed the course of remdesivir. He completed the course of antibiotics, which was started due to worsening leukocytosis.  MRSA PCR negative. Remained on maximum setting of heated high flow today. Complaining of hemoptysis, discontinuing Eliquis and a pulmonary consult, concern of alveolar  hemorrhage. Patient with worsening hypoxia, hemoptysis seems improving, transferred to stepdown.  Subjective: Pt reported doing well, big improvement in O2 requirement today, down to 5L via [place].  Eating ok.  No hemoptysis.     Assessment & Plan:   Active Problems:   Pneumonia due to COVID-19 virus  Acute hypoxic respiratory failure secondary to COVID-19 pneumonia. Patient with neutrophilic predominant leukocytosis, procalcitonin remain negative, markedly elevated CRP, D-dimer trending up, peaked second time.  Completed the course of remdesivir and received 2 doses of Actemra. Sputum culture with normal respiratory flora. CTA was done due to worsening D-dimer and it was negative for PE, patient was on Eliquis at that time.  CRP has been normalized. Developed hemoptysis, holding Eliquis for the past 3 days, hemoptysis improved.  He was transferred to stepdown as he was high risk for PE and alveolar hemorrhage.  Worsening leukocytosis which did show mild improvement, procalcitonin remain negative.  MRSA PCR negative -Pulmonary consult-appreciate their help. --completed empiric ceftriaxone and azithromycin --O2 requirement down to 5L today. Plan: --cont oral decadron -Continue with supplemental oxygen to keep the saturation above 90%. --cont combivent scheduled  Paroxysmal atrial fibrillation.   Currently rate well controlled.   Plan: --cont home amiodarone --cont Eliquis  Hypertension.   --hold spironolactone due to potassium trending up --Hold losartan --cont home oral lasix --extra IV lasix 40 mg x1 today for hyperkalemia  Hyperkalemia --hold spironolactone due to potassium trending up --extra IV lasix 40 mg x1 today for hyperkalemia  Hypothyroidism. --cont home Synthroid  Hyperglycemia 2/2 steroid use No prior diagnosis of diabetes, most likely steroid-induced. -A1c- 5.5 he is not diabetic Plan: --cont Lantus 10u BID --SSI  TID  BPH. -cont home Flomax and  Proscar  Depression.   Paxil was listed in his chart but apparently patient stopped taking it.  Hemoptysis, resolved In setting of pneumonia and anticoagulation.  Home Eliquis held and hemoptysis resolved.  Eliquis resumed. --Monitor bleeding while on Eliquis   Objective: Vitals:   01/10/21 1004 01/10/21 1012 01/10/21 1706 01/10/21 1800  BP:    (!) 115/50  Pulse:    (!) 57  Resp:    20  Temp:    98.4 F (36.9 C)  TempSrc:    Oral  SpO2: 97% 97% 93% 92%  Weight:      Height:        Intake/Output Summary (Last 24 hours) at 01/10/2021 1807 Last data filed at 01/10/2021 0900 Gross per 24 hour  Intake 480 ml  Output 1600 ml  Net -1120 ml   Filed Weights   01/07/21 0222 01/08/21 0250 01/10/21 0108  Weight: 88.4 kg 80 kg 87.8 kg    Examination:  Constitutional: NAD, AAOx3 HEENT: conjunctivae and lids normal, EOMI CV: No cyanosis.   RESP: normal respiratory effort, on 5L Extremities: No effusions, edema in BLE SKIN: warm, dry Neuro: II - XII grossly intact.   Psych: Normal mood and affect.  Appropriate judgement and reason    DVT prophylaxis: SCDs Code Status: Full Family Communication: son updated on the phone today Disposition Plan:  Status is: Inpatient  Dispo: The patient is from: Home              Anticipated d/c is to: Home              Anticipated d/c date is: 2-3 days              Patient currently is not medically stable to d/c.  On 5L O2.   Consultants:   Pulmonology  Procedures:  Antimicrobials:  Ceftriaxone Zithromax  Data Reviewed: I have personally reviewed following labs and imaging studies  CBC: Recent Labs  Lab 01/06/21 0322 01/07/21 0446 01/08/21 0511 01/09/21 0443 01/10/21 0412  WBC 27.9* 28.2* 33.4* 28.7* 30.9*  HGB 13.2 12.7* 13.7 12.6* 13.1  HCT 39.2 38.6* 41.3 38.0* 39.3  MCV 91.2 92.8 93.9 93.1 92.3  PLT 313 290 306 243 563   Basic Metabolic Panel: Recent Labs  Lab 01/04/21 0304 01/05/21 0706 01/06/21 0322  01/07/21 0446 01/08/21 0511 01/09/21 0443 01/10/21 0412  NA 141   < > 140 139 139 140 138  K 4.0   < > 4.0 5.1 4.8 5.2* 5.2*  CL 97*   < > 97* 98 99 102 98  CO2 33*   < > 33* 33* 32 32 32  GLUCOSE 155*   < > 133* 161* 179* 153* 137*  BUN 35*   < > 37* 33* 31* 31* 31*  CREATININE 0.96   < > 0.95 0.85 0.86 0.77 0.73  CALCIUM 8.3*   < > 8.4* 8.2* 8.2* 8.2* 8.4*  MG 2.4  --   --  2.8* 2.8* 2.6* 2.5*  PHOS 3.9  --   --   --   --   --   --    < > = values in this interval not displayed.   GFR: Estimated Creatinine Clearance: 82.2 mL/min (by C-G formula based on SCr of 0.73 mg/dL). Liver Function Tests: Recent Labs  Lab 01/04/21 0304 01/05/21 0706 01/06/21 0322  AST 20 19 22   ALT 35 27 29  ALKPHOS 96 90 90  BILITOT 0.7 0.7 0.7  PROT 5.4* 5.3* 5.3*  ALBUMIN 3.0* 3.0* 3.0*   No results for input(s): LIPASE, AMYLASE in the last 168 hours. No results for input(s): AMMONIA in the last 168 hours. Coagulation Profile: Recent Labs  Lab 01/03/21 1921 01/04/21 0304  INR 1.3* 1.3*   Cardiac Enzymes: No results for input(s): CKTOTAL, CKMB, CKMBINDEX, TROPONINI in the last 168 hours. BNP (last 3 results) No results for input(s): PROBNP in the last 8760 hours. HbA1C: No results for input(s): HGBA1C in the last 72 hours. CBG: Recent Labs  Lab 01/09/21 1728 01/09/21 2050 01/10/21 0801 01/10/21 1139 01/10/21 1756  GLUCAP 122* 226* 113* 141* 170*   Lipid Profile: No results for input(s): CHOL, HDL, LDLCALC, TRIG, CHOLHDL, LDLDIRECT in the last 72 hours. Thyroid Function Tests: No results for input(s): TSH, T4TOTAL, FREET4, T3FREE, THYROIDAB in the last 72 hours. Anemia Panel: No results for input(s): VITAMINB12, FOLATE, FERRITIN, TIBC, IRON, RETICCTPCT in the last 72 hours. Sepsis Labs: Recent Labs  Lab 01/04/21 0304  PROCALCITON <0.10    Recent Results (from the past 240 hour(s))  MRSA PCR Screening     Status: None   Collection Time: 01/01/21  8:47 AM   Specimen:  Nasal Mucosa; Nasopharyngeal  Result Value Ref Range Status   MRSA by PCR NEGATIVE NEGATIVE Final    Comment:        The GeneXpert MRSA Assay (FDA approved for NASAL specimens only), is one component of a comprehensive MRSA colonization surveillance program. It is not intended to diagnose MRSA infection nor to guide or monitor treatment for MRSA infections. Performed at [facility], [address] [place], [place] [address]   Expectorated sputum assessment w rflx to resp cult     Status: None   Collection Time: 01/04/21 10:40 AM   Specimen: Expectorated Sputum  Result Value Ref Range Status   Specimen Description EXPECTORATED SPUTUM  Final   Special Requests NONE  Final   Sputum evaluation   Final    Sputum specimen not acceptable for testing.  Please recollect.   NOTIFIED CHLOE ALLEN FOR RECOLLECT ON 01/04/21 AT 1153 QSD Performed at [facility], [address] [place], [place] [address]    Report Status 01/04/2021 FINAL  Final     Radiology Studies: No results found.  Scheduled Meds: . amiodarone  200 mg Oral Daily  . apixaban  5 mg Oral BID  . vitamin C  500 mg Oral Daily  . cholecalciferol  1,000 Units Oral Daily  . dexamethasone  6 mg Oral Daily  . famotidine  20 mg Oral BID  . finasteride  5 mg Oral Daily  . furosemide  40 mg Oral Daily  . guaiFENesin  600 mg Oral BID  . insulin aspart  0-15 Units Subcutaneous TID WC  . insulin aspart  0-5 Units Subcutaneous QHS  . insulin glargine  10 Units Subcutaneous BID  . Ipratropium-Albuterol  1 puff Inhalation Q6H  . levothyroxine  100 mcg Oral Daily  . mouth rinse  15 mL Mouth Rinse BID  . tamsulosin  0.8 mg Oral Daily  . traZODone  50 mg Oral QHS  . zinc sulfate  220 mg Oral Daily   Continuous Infusions: . sodium chloride 5 mL/hr at 01/06/21 0800     LOS: 13 days     Enzo Bi, MD [facility]  If 7PM-7AM, please contact night-coverage [email or web address]  01/10/2021, 6:07 PM

## 2021-01-11 LAB — CBC
HCT: 41.4 % (ref 39.0–52.0)
Hemoglobin: 14.2 g/dL (ref 13.0–17.0)
MCH: 31.1 pg (ref 26.0–34.0)
MCHC: 34.3 g/dL (ref 30.0–36.0)
MCV: 90.6 fL (ref 80.0–100.0)
Platelets: 277 10*3/uL (ref 150–400)
RBC: 4.57 MIL/uL (ref 4.22–5.81)
RDW: 14.4 % (ref 11.5–15.5)
WBC: 32.2 10*3/uL — ABNORMAL HIGH (ref 4.0–10.5)
nRBC: 0 % (ref 0.0–0.2)

## 2021-01-11 LAB — GLUCOSE, CAPILLARY
Glucose-Capillary: 130 mg/dL — ABNORMAL HIGH (ref 70–99)
Glucose-Capillary: 180 mg/dL — ABNORMAL HIGH (ref 70–99)
Glucose-Capillary: 157 mg/dL — ABNORMAL HIGH (ref 70–99)
Glucose-Capillary: 176 mg/dL — ABNORMAL HIGH (ref 70–99)

## 2021-01-11 LAB — BASIC METABOLIC PANEL
Anion gap: 13 (ref 5–15)
BUN: 37 mg/dL — ABNORMAL HIGH (ref 8–23)
CO2: 32 mmol/L (ref 22–32)
Calcium: 8.4 mg/dL — ABNORMAL LOW (ref 8.9–10.3)
Chloride: 92 mmol/L — ABNORMAL LOW (ref 98–111)
Creatinine, Ser: 0.95 mg/dL (ref 0.61–1.24)
GFR, Estimated: >60 mL/min (ref >60)
Glucose, Bld: 142 mg/dL — ABNORMAL HIGH (ref 70–99)
Potassium: 5.2 mmol/L — ABNORMAL HIGH (ref 3.5–5.1)
Sodium: 137 mmol/L (ref 135–145)

## 2021-01-11 LAB — MAGNESIUM: Magnesium: 2.6 mg/dL — ABNORMAL HIGH (ref 1.7–2.4)

## 2021-01-11 MED ORDER — MAGIC MOUTHWASH W/LIDOCAINE
10.0000 mL | Freq: Three times a day (TID) | ORAL | Status: DC
  Administered 2021-01-11 – 2021-01-13 (×6): 10 mL via ORAL
  Filled 2021-01-11 (×9): qty 10

## 2021-01-11 MED ORDER — SODIUM POLYSTYRENE SULFONATE 15 GM/60ML PO SUSP
15.0000 g | Freq: Once | ORAL | Status: AC
  Administered 2021-01-11: 15 g via ORAL
  Filled 2021-01-11: qty 60

## 2021-01-11 NOTE — Plan of Care (Signed)
  Problem: Clinical Measurements: Goal: Respiratory complications will improve Outcome: Progressing   Problem: Safety: Goal: Ability to remain free from injury will improve Outcome: Progressing   

## 2021-01-11 NOTE — TOC Progression Note (Signed)
Transition of Care [facility]) - Progression Note    Patient Details  Name: Nathaniel Ibarra MRN: [id number] Date of Birth: August 27, 1941  Transition of Care [facility]) CM/SW Contact  Eileen Stanford, LCSW Phone Number: 01/11/2021, 3:21 PM  Clinical Narrative:   [facility] will service pt. Will need HH, 02, and 3n1 orders prior to d/c.    Expected Discharge Plan: [place] Barriers to Discharge: Continued Medical Work up  Expected Discharge Plan and Services Expected Discharge Plan: [place] In-house Referral: NA   Post Acute Care Choice: [place] arrangements for the past 2 months: Single Family Home                 DME Arranged: 3-N-1,Oxygen DME Agency: [employer] Date DME Agency Contacted: 01/11/21 Time DME Agency Contacted: [phone] Representative spoke with at DME Agency: patricia             Social Determinants of Health ([place]) Interventions    Readmission Risk Interventions Readmission Risk Prevention Plan 11/24/2020  Post Dischage Appt Complete  Medication Screening Complete  Transportation Screening Complete  Some recent data might be hidden

## 2021-01-11 NOTE — Progress Notes (Signed)
SATURATION QUALIFICATIONS: (This note is used to comply with regulatory documentation for home oxygen)  Patient Saturations on Room Air at Rest = 90  Patient Saturations on Room Air while Ambulating = <82  Patient Saturations on 4 Liters of oxygen while Ambulating = 86  Please briefly explain why patient needs home oxygen: patient dsats while standing at bedside and also while ambulating

## 2021-01-11 NOTE — Progress Notes (Signed)
PROGRESS NOTE    Nathaniel Ibarra  [id number] DOB: [birth year] DOA: 12/28/2020 PCP: [name], MD   Brief Narrative: Taken from H&P Nathaniel Ibarra  is a 80 y.o. Caucasian male with a known history of hypertension, coronary artery disease, status post PCI and stent, paroxysmal atrial fibrillation, CHF, BPH and hypothyroidism, who presented to the emergency room with acute onset of worsening cough with dyspnea.  His symptoms started about a week and a half ago with sore throat followed by dry cough occasionally productive of whitish and greenish sputum.  He denies any fever chills.  He admitted to significant diminished sense of taste and smell.  He has been having fatigue and tiredness.  He denied any loss of appetite.  No nausea or vomiting or diarrhea.  He has been exposed to a friend with COVID prior to his symptoms.  His grandson and his wife tested positive yesterday for COVID-19 and their symptoms started 5 days ago. COVID-19 PCR was positive, chest x-ray with bilateral infiltrate consistent with atypical pneumonia/pulmonary edema. He was hypoxic in high 80s requiring 2 to 4 L of oxygen initially currently on 6 L. He was started on remdesivir, steroid and baricitinib. Baricitinib discontinued due to worsening leukocytosis.  patient is vaccinated with 2 doses and no booster. Patient desaturated later in the day requiring nonrebreather, 1 dose of Actemra given.  CTA ordered for today although he is on Eliquis, which was negative for PE although films were little motion degraded. Worsening oxygen requirement today-transitioning to heated high flow. Received 2 doses of Actemra, completed the course of remdesivir. He completed the course of antibiotics, which was started due to worsening leukocytosis.  MRSA PCR negative. Remained on maximum setting of heated high flow today. Complaining of hemoptysis, discontinuing Eliquis and a pulmonary consult, concern of alveolar  hemorrhage. Patient with worsening hypoxia, hemoptysis seems improving, transferred to stepdown.  Subjective: Pt made more improvement in O2 requirement, down to 2L at rest, but still needed >4L with walking.  No N/V/D.  No hemoptysis.     Assessment & Plan:   Active Problems:   Pneumonia due to COVID-19 virus  Acute hypoxic respiratory failure secondary to COVID-19 pneumonia. Patient with neutrophilic predominant leukocytosis, procalcitonin remain negative, markedly elevated CRP, D-dimer trending up, peaked second time.  Completed the course of remdesivir and received 2 doses of Actemra. Sputum culture with normal respiratory flora. CTA was done due to worsening D-dimer and it was negative for PE, patient was on Eliquis at that time.  CRP has been normalized. Developed hemoptysis, holding Eliquis for the past 3 days, hemoptysis improved.  He was transferred to stepdown as he was high risk for PE and alveolar hemorrhage.  Worsening leukocytosis which did show mild improvement, procalcitonin remain negative.  MRSA PCR negative -Pulmonary consult-appreciate their help. --completed empiric ceftriaxone and azithromycin --O2 requirement down to 2L at rest, but still dropped to 86% walking on 4L. Plan: --cont oral decadron --Continue supplemental O2 to keep sats >=90%, wean as tolerated --cont combivent scheduled  Paroxysmal atrial fibrillation.   Currently rate well controlled.   Plan: --cont home admiodarone --cont Eliquis  Hypertension.   --hold spironolactone due to potassium trending up --Hold losartan --cont home oral lasix --extra IV lasix 40 mg x1 today for hyperkalemia  Hyperkalemia --hold spironolactone due to potassium trending up --Kayexalate x1 today  Hypothyroidism. --cont home Synthroid  Hyperglycemia 2/2 steroid use No prior diagnosis of diabetes, most likely steroid-induced. -A1c- 5.5 he is not diabetic  Plan: --cont Lantus 10u BID --SSI  TID  BPH. --cont home Flomax and Proscar  Depression.   Paxil was listed in his chart but apparently patient stopped taking it.  Hemoptysis, resolved In setting of pneumonia and anticoagulation.  Home Eliquis held and hemoptysis resolved.  Eliquis resumed. --Monitor bleeding while on Eliquis   Objective: Vitals:   01/11/21 0424 01/11/21 0755 01/11/21 1111 01/11/21 1612  BP: (!) 106/36 (!) 132/59 (!) 125/57 (!) 127/59  Pulse: (!) 54 (!) 51 (!) 51 (!) 54  Resp: 18 (!) 22 (!) 22 (!) 22  Temp: 97.6 F (36.4 C) 97.8 F (36.6 C) 98.3 F (36.8 C) 97.8 F (36.6 C)  TempSrc: Oral Oral Oral Oral  SpO2: 94% 92% 94% 92%  Weight: 84.8 kg     Height:        Intake/Output Summary (Last 24 hours) at 01/11/2021 1628 Last data filed at 01/11/2021 1457 Gross per 24 hour  Intake 120 ml  Output 1500 ml  Net -1380 ml   Filed Weights   01/08/21 0250 01/10/21 0108 01/11/21 0424  Weight: 80 kg 87.8 kg 84.8 kg    Examination:  Constitutional: NAD, AAOx3 HEENT: conjunctivae and lids normal, EOMI CV: No cyanosis.   RESP: normal respiratory effort, on 2L Extremities: No effusions, edema in BLE SKIN: warm, dry Neuro: II - XII grossly intact.   Psych: Normal mood and affect.  Appropriate judgement and reason   DVT prophylaxis: Eliquis Code Status: Full Family Communication: wife updated on the phone today Disposition Plan:  Status is: Inpatient  Dispo: The patient is from: Home              Anticipated d/c is to: Home              Anticipated d/c date is: 2-3 days              Patient currently is not medically stable to d/c.  Still needed >4L O2 with walking.   Consultants:   Pulmonology  Procedures:  Antimicrobials:  Ceftriaxone Zithromax  Data Reviewed: I have personally reviewed following labs and imaging studies  CBC: Recent Labs  Lab 01/07/21 0446 01/08/21 0511 01/09/21 0443 01/10/21 0412 01/11/21 0313  WBC 28.2* 33.4* 28.7* 30.9* 32.2*  HGB 12.7* 13.7 12.6*  13.1 14.2  HCT 38.6* 41.3 38.0* 39.3 41.4  MCV 92.8 93.9 93.1 92.3 90.6  PLT 290 306 243 252 202   Basic Metabolic Panel: Recent Labs  Lab 01/07/21 0446 01/08/21 0511 01/09/21 0443 01/10/21 0412 01/11/21 0313  NA 139 139 140 138 137  K 5.1 4.8 5.2* 5.2* 5.2*  CL 98 99 102 98 92*  CO2 33* 32 32 32 32  GLUCOSE 161* 179* 153* 137* 142*  BUN 33* 31* 31* 31* 37*  CREATININE 0.85 0.86 0.77 0.73 0.95  CALCIUM 8.2* 8.2* 8.2* 8.4* 8.4*  MG 2.8* 2.8* 2.6* 2.5* 2.6*   GFR: Estimated Creatinine Clearance: 69.2 mL/min (by C-G formula based on SCr of 0.95 mg/dL). Liver Function Tests: Recent Labs  Lab 01/05/21 0706 01/06/21 0322  AST 19 22  ALT 27 29  ALKPHOS 90 90  BILITOT 0.7 0.7  PROT 5.3* 5.3*  ALBUMIN 3.0* 3.0*   No results for input(s): LIPASE, AMYLASE in the last 168 hours. No results for input(s): AMMONIA in the last 168 hours. Coagulation Profile: No results for input(s): INR, PROTIME in the last 168 hours. Cardiac Enzymes: No results for input(s): CKTOTAL, CKMB, CKMBINDEX, TROPONINI in the last  168 hours. BNP (last 3 results) No results for input(s): PROBNP in the last 8760 hours. HbA1C: No results for input(s): HGBA1C in the last 72 hours. CBG: Recent Labs  Lab 01/10/21 1756 01/10/21 2144 01/11/21 0752 01/11/21 1116 01/11/21 1607  GLUCAP 170* 194* 130* 180* 157*   Lipid Profile: No results for input(s): CHOL, HDL, LDLCALC, TRIG, CHOLHDL, LDLDIRECT in the last 72 hours. Thyroid Function Tests: No results for input(s): TSH, T4TOTAL, FREET4, T3FREE, THYROIDAB in the last 72 hours. Anemia Panel: No results for input(s): VITAMINB12, FOLATE, FERRITIN, TIBC, IRON, RETICCTPCT in the last 72 hours. Sepsis Labs: No results for input(s): PROCALCITON, LATICACIDVEN in the last 168 hours.  Recent Results (from the past 240 hour(s))  Expectorated sputum assessment w rflx to resp cult     Status: None   Collection Time: 01/04/21 10:40 AM   Specimen: Expectorated  Sputum  Result Value Ref Range Status   Specimen Description EXPECTORATED SPUTUM  Final   Special Requests NONE  Final   Sputum evaluation   Final    Sputum specimen not acceptable for testing.  Please recollect.   NOTIFIED CHLOE ALLEN FOR RECOLLECT ON 01/04/21 AT 1153 QSD Performed at [facility], [address], [place], [place] [address]    Report Status 01/04/2021 FINAL  Final     Radiology Studies: No results found.  Scheduled Meds: . amiodarone  200 mg Oral Daily  . apixaban  5 mg Oral BID  . vitamin C  500 mg Oral Daily  . cholecalciferol  1,000 Units Oral Daily  . dexamethasone  6 mg Oral Daily  . famotidine  20 mg Oral BID  . finasteride  5 mg Oral Daily  . furosemide  40 mg Oral Daily  . guaiFENesin  600 mg Oral BID  . insulin aspart  0-15 Units Subcutaneous TID WC  . insulin glargine  10 Units Subcutaneous BID  . Ipratropium-Albuterol  1 puff Inhalation Q6H  . levothyroxine  100 mcg Oral Daily  . magic mouthwash w/lidocaine  10 mL Oral TID  . mouth rinse  15 mL Mouth Rinse BID  . sodium chloride flush  10 mL Intravenous Q12H  . tamsulosin  0.8 mg Oral Daily  . traZODone  50 mg Oral QHS  . zinc sulfate  220 mg Oral Daily   Continuous Infusions: . sodium chloride 5 mL/hr at 01/06/21 0800     LOS: 14 days     Enzo Bi, MD [facility]  If 7PM-7AM, please contact night-coverage [email or web address]  01/11/2021, 4:28 PM

## 2021-01-11 NOTE — TOC Initial Note (Addendum)
Transition of Care [facility]) - Initial/Assessment Note    Patient Details  Name: Nathaniel SCHELLENBERG MRN: [id number] Date of Birth: 04/25/1941  Transition of Care [facility]) CM/SW Contact:    Eileen Stanford, LCSW Phone Number: 01/11/2021, 11:49 AM  Clinical Narrative:    Pt is alert and oriented in a contact room due to [place]. CSW spoke with pt via telephone. Pt is refusing SNF. Pt is agreeable to [facility] however, does not have agency preference. CSW will reach out to [facility] agencies to determine availability--pt is agreeable. Pt states he will need 02 at home, pt is also agreeable to 3n1.                Expected Discharge Plan: [place] Barriers to Discharge: Continued Medical Work up   Patient Goals and CMS Choice Patient states their goals for this hospitalization and ongoing recovery are:: to go home   Choice offered to / list presented to : Patient  Expected Discharge Plan and Services Expected Discharge Plan: [place] In-house Referral: NA   Post Acute Care Choice: [place] arrangements for the past 2 months: Single Family Home                 DME Arranged: 3-N-1,Oxygen DME Agency: [employer] Date DME Agency Contacted: 01/11/21 Time DME Agency Contacted: [phone] Representative spoke with at DME Agency: patricia            Prior Living Arrangements/Services Living arrangements for the past 2 months: [place] Lives with:: Spouse Patient language and need for interpreter reviewed:: Yes Do you feel safe going back to the place where you live?: Yes      Need for Family Participation in Patient Care: Yes (Comment) Care giver support system in place?: Yes (comment)   Criminal Activity/Legal Involvement Pertinent to Current Situation/Hospitalization: No - Comment as needed  Activities of Daily Living Home Assistive Devices/Equipment: None ADL Screening (condition at time of admission) Patient's cognitive ability adequate to safely  complete daily activities?: Yes Is the patient deaf or have difficulty hearing?: No Does the patient have difficulty seeing, even when wearing glasses/contacts?: No Does the patient have difficulty concentrating, remembering, or making decisions?: No Patient able to express need for assistance with ADLs?: Yes Does the patient have difficulty dressing or bathing?: No Independently performs ADLs?: Yes (appropriate for developmental age) Does the patient have difficulty walking or climbing stairs?: Yes Weakness of Legs: Both Weakness of Arms/Hands: None  Permission Sought/Granted Permission sought to share information with : Family Supports Permission granted to share information with : Yes, Verbal Permission Granted  Share Information with NAME: [name]     Permission granted to share info w Relationship: spouse     Emotional Assessment Appearance:: Appears stated age Attitude/Demeanor/Rapport: Engaged Affect (typically observed): Accepting Orientation: : Oriented to Self,Oriented to Place,Oriented to  Time Alcohol / Substance Use: Not Applicable Psych Involvement: No (comment)  Admission diagnosis:  Acute respiratory failure with hypoxia ([place]) [J96.01] Acute hypoxemic respiratory failure due to COVID-19 ([place]) [U07.1, J96.01] Pneumonia due to COVID-19 virus [U07.1, J12.82] Patient Active Problem List   Diagnosis Date Noted  . Acute respiratory failure with hypoxia ([place])   . Pneumonia due to COVID-19 virus 12/28/2020  . Testicular abscess 11/21/2020  . Acute lower UTI 11/21/2020  . Chronic HFrEF (heart failure with reduced ejection fraction) ([place]) 11/21/2020  . AF (paroxysmal atrial fibrillation) ([place])   . Essential hypertension   . Hypothyroidism   .  Acute on chronic combined systolic and diastolic CHF (congestive heart failure) ([place]) 05/16/2020  . Acute on chronic systolic CHF (congestive heart failure) ([place]) 05/16/2020  . BPH (benign prostatic hyperplasia)   . GERD  (gastroesophageal reflux disease)   . Hypertensive urgency   . CHF (congestive heart failure) ([place]) 11/13/2017  . Atrial fibrillation with RVR ([place]) 11/12/2017  . Pulmonary edema cardiac cause ([place]) 11/12/2017  . Elevated troponin 11/12/2017  . Coronary artery disease 09/02/2015  . Acute coronary syndrome ([place]) 09/01/2015  . Unstable angina ([place]) 09/01/2015  . Chest pain 08/16/2015   PCP:  [name], MD Pharmacy:   [id number] [place], [address] [place] [address] Phone: [phone] Fax: [phone]     Social Determinants of Health (SDOH) Interventions    Readmission Risk Interventions Readmission Risk Prevention Plan 11/24/2020  Post Dischage Appt Complete  Medication Screening Complete  Transportation Screening Complete  Some recent data might be hidden

## 2021-01-11 NOTE — Progress Notes (Signed)
Physical Therapy Treatment Patient Details Name: Nathaniel Ibarra MRN: [id number] DOB: 10-25-1941 Today's Date: 01/11/2021    History of Present Illness Nathaniel Ibarra  is a 80 y.o. Caucasian male with a known history of hypertension, coronary artery disease, status post PCI and stent, paroxysmal atrial fibrillation, CHF, BPH and hypothyroidism, who presented to the emergency room with acute onset of worsening cough with dyspnea.    PT Comments    Pt is making good progress towards goals. Much improvement in safety noted this session with appropriate change in recs to HHPT, however will need 24/7 assist for safety/supervision. Decreased endurance noted, however anticipate will be able to perform short in home distances with RW. Educated on required use of RW for safety/balance/endurance. Initially, on 2L of O2 upon arrival, however needed to be increased to 4L of O2 with exertion. Will continue to progress as able.    Follow Up Recommendations  Home health PT;Supervision/Assistance - 24 hour     Equipment Recommendations  Rolling walker with 5" wheels    Recommendations for Other Services       Precautions / Restrictions Precautions Precautions: Fall Restrictions Weight Bearing Restrictions: No    Mobility  Bed Mobility Overal bed mobility: Needs Assistance Bed Mobility: Supine to Sit     Supine to sit: Supervision;HOB elevated Sit to supine: Supervision;HOB elevated   General bed mobility comments: Improved technique this date. More fluid movement noted  Transfers Overall transfer level: Needs assistance Equipment used: Rolling walker (2 wheeled) Transfers: Sit to/from Stand Sit to Stand: Min guard         General transfer comment: cues to push from seated surface. Once standing, upright posture noted. All mobility performed on 4L of O2.  Ambulation/Gait Ambulation/Gait assistance: Min guard Gait Distance (Feet): 50 Feet Assistive device: Rolling walker (2  wheeled) Gait Pattern/deviations: Step-through pattern     General Gait Details: slow gait pattern with reciprocal gait pattern. Does fatigue with increased distance. O2 sats decrease to 86% on 4L of O2. Quick recovery once seated   Stairs             Wheelchair Mobility    Modified Rankin (Stroke Patients Only)       Balance Overall balance assessment: Needs assistance Sitting-balance support: No upper extremity supported;Feet supported Sitting balance-Leahy Scale: Good     Standing balance support: Bilateral upper extremity supported Standing balance-Leahy Scale: Fair Standing balance comment: benefits from UE support on RW, able to maintain balance while using unilateral UE for posterior peri-care                            Cognition Arousal/Alertness: Awake/alert Behavior During Therapy: WFL for tasks assessed/performed Overall Cognitive Status: Within Functional Limits for tasks assessed                                 General Comments: Pt alert and oriented, willing to participate with interventions today.      Exercises Other Exercises Other Exercises: Suine/seated ther-ex performed on B LE including quad sets, SLRs, heel slides, LAQ, and alt. marching. All ther-ex performed x 12 reps with supervision and safe technique.    General Comments General comments (skin integrity, edema, etc.): Pt on 3L via [place] to date.      Pertinent Vitals/Pain Pain Assessment: No/denies pain    Home Living  Prior Function            PT Goals (current goals can now be found in the care plan section) Acute Rehab PT Goals Patient Stated Goal: to get stronger PT Goal Formulation: With patient Time For Goal Achievement: 01/17/21 Potential to Achieve Goals: Good Progress towards PT goals: Progressing toward goals    Frequency    Min 2X/week      PT Plan Discharge plan needs to be updated    Co-evaluation               AM-PAC PT "6 Clicks" Mobility   Outcome Measure  Help needed turning from your back to your side while in a flat bed without using bedrails?: A Little Help needed moving from lying on your back to sitting on the side of a flat bed without using bedrails?: A Little Help needed moving to and from a bed to a chair (including a wheelchair)?: A Little Help needed standing up from a chair using your arms (e.g., wheelchair or bedside chair)?: A Little Help needed to walk in hospital room?: A Little Help needed climbing 3-5 steps with a railing? : A Lot 6 Click Score: 17    End of Session Equipment Utilized During Treatment: Oxygen;Gait belt Activity Tolerance: Patient tolerated treatment well Patient left: in bed;with bed alarm set Nurse Communication: Mobility status PT Visit Diagnosis: Unsteadiness on feet (R26.81);Other abnormalities of gait and mobility (R26.89);Muscle weakness (generalized) (M62.81)     Time: 8546-2703 PT Time Calculation (min) (ACUTE ONLY): 24 min  Charges:  $Gait Training: 8-22 mins $Therapeutic Exercise: 8-22 mins                     Greggory Stallion, PT, DPT [phone]    Asucena Galer 01/11/2021, 1:03 PM

## 2021-01-11 NOTE — Progress Notes (Signed)
Occupational Therapy Treatment Patient Details Name: Nathaniel Ibarra MRN: [id number] DOB: 11-10-1941 Today's Date: 01/11/2021    History of present illness Nathaniel Ibarra  is a 80 y.o. Caucasian male with a known history of hypertension, coronary artery disease, status post PCI and stent, paroxysmal atrial fibrillation, CHF, BPH and hypothyroidism, who presented to the emergency room with acute onset of worsening cough with dyspnea.   OT comments  Pt seen for OT treatment on this date. Upon arrival to room pt awake, seated upright in bed, on 3L of O2 via [place] and reporting no pain. Pt agreeable to tx. This session, pt able to perform bed mobility with SUPERVISION, functional mobility (~45ft) with RW and MIN GUARD, sink-side standing grooming tasks with MIN A, and sit>stand toilet hygiene with MIN A. Pt making good progress toward goals and continues to demonstrate increased endurance and strength. Pt continues to benefit from skilled OT services to maximize return to PLOF and minimize risk of future falls, injury, caregiver burden, and readmission. Will continue to follow POC. Discharge recommendation remains appropriate.    Follow Up Recommendations  Home health OT    Equipment Recommendations  3 in 1 bedside commode       Precautions / Restrictions Precautions Precautions: Fall Restrictions Weight Bearing Restrictions: No       Mobility Bed Mobility Overal bed mobility: Needs Assistance Bed Mobility: Supine to Sit     Supine to sit: Supervision;HOB elevated        Transfers Overall transfer level: Needs assistance Equipment used: Rolling walker (2 wheeled) Transfers: Sit to/from Stand Sit to Stand: Supervision              Balance Overall balance assessment: Needs assistance Sitting-balance support: No upper extremity supported;Feet supported Sitting balance-Leahy Scale: Good       Standing balance-Leahy Scale: Fair Standing balance comment: benefits from UE  support on RW, able to maintain balance while using unilateral UE for posterior peri-care                           ADL either performed or assessed with clinical judgement   ADL Overall ADL's : Needs assistance/impaired     Grooming: Wash/dry hands;Minimal assistance;Standing Grooming Details (indicate cue type and reason): Pt able to wash/dry hands sink-side, resting bilateral elbows on sink for support                 Toilet Transfer: Min guard;Ambulation;BSC;RW   Toileting- Clothing Manipulation and Hygiene: Minimal assistance;Sit to/from stand Toileting - Clothing Manipulation Details (indicate cue type and reason): to/from [facility] with bath wipes     Functional mobility during ADLs: Min guard;Rolling walker (to walk bed>sink)                 Cognition Arousal/Alertness: Awake/alert Behavior During Therapy: WFL for tasks assessed/performed Overall Cognitive Status: Within Functional Limits for tasks assessed                                 General Comments: Pt alert and oriented, willing to participate with interventions today.              General Comments Pt on 3L via [place] to date.    Pertinent Vitals/ Pain       Pain Assessment: No/denies pain         Frequency  Min 1X/week  Progress Toward Goals  OT Goals(current goals can now be found in the care plan section)  Progress towards OT goals: Progressing toward goals  Acute Rehab OT Goals Patient Stated Goal: to get stronger OT Goal Formulation: With patient Time For Goal Achievement: 01/18/21 Potential to Achieve Goals: Good  Plan Discharge plan remains appropriate;Frequency remains appropriate       AM-PAC OT "6 Clicks" Daily Activity     Outcome Measure   Help from another person eating meals?: None Help from another person taking care of personal grooming?: A Little Help from another person toileting, which includes using toliet, bedpan, or urinal?: A  Little Help from another person bathing (including washing, rinsing, drying)?: A Little Help from another person to put on and taking off regular upper body clothing?: None Help from another person to put on and taking off regular lower body clothing?: A Little 6 Click Score: 20    End of Session Equipment Utilized During Treatment: Rolling walker;Oxygen  OT Visit Diagnosis: Other abnormalities of gait and mobility (R26.89)   Activity Tolerance Patient tolerated treatment well   Patient Left Other (comment);with nursing/sitter in room (on [facility] with nurse tech)   Nurse Communication Mobility status        Time: 1751-0258 OT Time Calculation (min): 15 min  Charges: OT General Charges $OT Visit: 1 Visit OT Treatments $Self Care/Home Management : 8-22 mins   Nathaniel Ibarra, OTR/L [place]

## 2021-01-12 LAB — CBC
HCT: 38.9 % — ABNORMAL LOW (ref 39.0–52.0)
Hemoglobin: 13.3 g/dL (ref 13.0–17.0)
MCH: 31.1 pg (ref 26.0–34.0)
MCHC: 34.2 g/dL (ref 30.0–36.0)
MCV: 90.9 fL (ref 80.0–100.0)
Platelets: 259 10*3/uL (ref 150–400)
RBC: 4.28 MIL/uL (ref 4.22–5.81)
RDW: 14.6 % (ref 11.5–15.5)
WBC: 27.6 10*3/uL — ABNORMAL HIGH (ref 4.0–10.5)
nRBC: 0 % (ref 0.0–0.2)

## 2021-01-12 LAB — BASIC METABOLIC PANEL
Anion gap: 8 (ref 5–15)
BUN: 39 mg/dL — ABNORMAL HIGH (ref 8–23)
CO2: 31 mmol/L (ref 22–32)
Calcium: 8.1 mg/dL — ABNORMAL LOW (ref 8.9–10.3)
Chloride: 96 mmol/L — ABNORMAL LOW (ref 98–111)
Creatinine, Ser: 0.95 mg/dL (ref 0.61–1.24)
GFR, Estimated: >60 mL/min (ref >60)
Glucose, Bld: 151 mg/dL — ABNORMAL HIGH (ref 70–99)
Potassium: 4.6 mmol/L (ref 3.5–5.1)
Sodium: 135 mmol/L (ref 135–145)

## 2021-01-12 LAB — GLUCOSE, CAPILLARY
Glucose-Capillary: 102 mg/dL — ABNORMAL HIGH (ref 70–99)
Glucose-Capillary: 209 mg/dL — ABNORMAL HIGH (ref 70–99)
Glucose-Capillary: 127 mg/dL — ABNORMAL HIGH (ref 70–99)
Glucose-Capillary: 193 mg/dL — ABNORMAL HIGH (ref 70–99)

## 2021-01-12 LAB — MAGNESIUM: Magnesium: 2.5 mg/dL — ABNORMAL HIGH (ref 1.7–2.4)

## 2021-01-12 NOTE — Progress Notes (Signed)
PROGRESS NOTE    Nathaniel Ibarra  WGY:[id number] DOB: 1941-07-19 DOA: 12/28/2020 PCP: [name], MD   Brief Narrative: Taken from H&P Nathaniel Ibarra  is a 80 y.o. Caucasian male with a known history of hypertension, coronary artery disease, status post PCI and stent, paroxysmal atrial fibrillation, CHF, BPH and hypothyroidism, who presented to the emergency room with acute onset of worsening cough with dyspnea.  His symptoms started about a week and a half ago with sore throat followed by dry cough occasionally productive of whitish and greenish sputum.  He denies any fever chills.  He admitted to significant diminished sense of taste and smell.  He has been having fatigue and tiredness.  He denied any loss of appetite.  No nausea or vomiting or diarrhea.  He has been exposed to a friend with COVID prior to his symptoms.  His grandson and his wife tested positive yesterday for COVID-19 and their symptoms started 5 days ago. COVID-19 PCR was positive, chest x-ray with bilateral infiltrate consistent with atypical pneumonia/pulmonary edema. He was hypoxic in high 80s requiring 2 to 4 L of oxygen initially currently on 6 L. He was started on remdesivir, steroid and baricitinib. Baricitinib discontinued due to worsening leukocytosis.  patient is vaccinated with 2 doses and no booster. Patient desaturated later in the day requiring nonrebreather, 1 dose of Actemra given.  CTA ordered for today although he is on Eliquis, which was negative for PE although films were little motion degraded. Worsening oxygen requirement today-transitioning to heated high flow. Received 2 doses of Actemra, completed the course of remdesivir. He completed the course of antibiotics, which was started due to worsening leukocytosis.  MRSA PCR negative. Remained on maximum setting of heated high flow today. Complaining of hemoptysis, discontinuing Eliquis and a pulmonary consult, concern of alveolar  hemorrhage. Patient with worsening hypoxia, hemoptysis seems improving, transferred to stepdown.  Subjective: Complained mainly of painful tongue.  Respiratory status continued to improve.     Assessment & Plan:   Active Problems:   Pneumonia due to COVID-19 virus  Acute hypoxic respiratory failure secondary to COVID-19 pneumonia. Patient with neutrophilic predominant leukocytosis, procalcitonin remain negative, markedly elevated CRP, D-dimer trending up, peaked second time.  Completed the course of remdesivir and received 2 doses of Actemra. Sputum culture with normal respiratory flora. CTA was done due to worsening D-dimer and it was negative for PE, patient was on Eliquis at that time.  CRP has been normalized. Developed hemoptysis, holding Eliquis for the past 3 days, hemoptysis improved.  He was transferred to stepdown as he was high risk for PE and alveolar hemorrhage.  Worsening leukocytosis which did show mild improvement, procalcitonin remain negative.  MRSA PCR negative -Pulmonary consult-appreciate their help. --completed empiric ceftriaxone and azithromycin --O2 requirement down to 2L at rest, but needed 4L with walking. Plan: --cont oral decadron until discharge. --Continue supplemental O2 to keep sats >=90%, wean as tolerated --cont combivent scheduled --will discharge on 4L O2.  Paroxysmal atrial fibrillation.   Currently rate well controlled, HR 50's-60's Plan: --cont home amiodarone --cont Eliquis  Hypertension.   --BP acceptable  --hold spironolactone due to potassium trending up --Hold losartan --cont home lasix 40 mg daily  Hyperkalemia --resolved after Kayexalate x1 --cont to hold home spironolactone  Hypothyroidism. --cont home Synthroid  Hyperglycemia 2/2 steroid use No prior diagnosis of diabetes, most likely steroid-induced. -A1c- 5.5 he is not diabetic Plan: --cont Lantus 10u BID --SSI TID  BPH. --cont home Flomax and  Proscar  Depression.   Paxil was listed in his chart but apparently patient stopped taking it.  Hemoptysis, resolved In setting of pneumonia and anticoagulation.  Home Eliquis held and hemoptysis resolved.  Eliquis resumed. --cont home Eliquis   Objective: Vitals:   01/12/21 0506 01/12/21 0927 01/12/21 1115 01/12/21 1547  BP: (!) 126/54 129/62 (!) 125/48 (!) 123/56  Pulse: (!) 51 (!) 50 (!) 52 (!) 59  Resp: 20 18 19 20   Temp: 97.7 F (36.5 C) 98.5 F (36.9 C) 97.9 F (36.6 C) 97.8 F (36.6 C)  TempSrc: Oral Oral Oral Oral  SpO2: 92% 92% 92% 92%  Weight:      Height:        Intake/Output Summary (Last 24 hours) at 01/12/2021 1746 Last data filed at 01/12/2021 1000 Gross per 24 hour  Intake --  Output 1900 ml  Net -1900 ml   Filed Weights   01/08/21 0250 01/10/21 0108 01/11/21 0424  Weight: 80 kg 87.8 kg 84.8 kg    Examination:  Constitutional: NAD, AAOx3 HEENT: conjunctivae and lids normal, EOMI, brown superficial ulcerations around edge of tongue CV: No cyanosis.   RESP: normal respiratory effort, on 2L at rest Extremities: No effusions, edema in BLE SKIN: warm, dry Neuro: II - XII grossly intact.   Psych: Normal mood and affect.  Appropriate judgement and reason    DVT prophylaxis: Eliquis Code Status: Full Family Communication:  Disposition Plan:  Status is: Inpatient  Dispo: The patient is from: Home              Anticipated d/c is to: Home with [facility]              Anticipated d/c date is: tomorrow with 4L O2.   Consultants:   Pulmonology  Procedures:  Antimicrobials:  Ceftriaxone Zithromax  Data Reviewed: I have personally reviewed following labs and imaging studies  CBC: Recent Labs  Lab 01/08/21 0511 01/09/21 0443 01/10/21 0412 01/11/21 0313 01/12/21 0550  WBC 33.4* 28.7* 30.9* 32.2* 27.6*  HGB 13.7 12.6* 13.1 14.2 13.3  HCT 41.3 38.0* 39.3 41.4 38.9*  MCV 93.9 93.1 92.3 90.6 90.9  PLT 306 243 252 277 409   Basic Metabolic  Panel: Recent Labs  Lab 01/08/21 0511 01/09/21 0443 01/10/21 0412 01/11/21 0313 01/12/21 0550  NA 139 140 138 137 135  K 4.8 5.2* 5.2* 5.2* 4.6  CL 99 102 98 92* 96*  CO2 32 32 32 32 31  GLUCOSE 179* 153* 137* 142* 151*  BUN 31* 31* 31* 37* 39*  CREATININE 0.86 0.77 0.73 0.95 0.95  CALCIUM 8.2* 8.2* 8.4* 8.4* 8.1*  MG 2.8* 2.6* 2.5* 2.6* 2.5*   GFR: Estimated Creatinine Clearance: 69.2 mL/min (by C-G formula based on SCr of 0.95 mg/dL). Liver Function Tests: Recent Labs  Lab 01/06/21 0322  AST 22  ALT 29  ALKPHOS 90  BILITOT 0.7  PROT 5.3*  ALBUMIN 3.0*   No results for input(s): LIPASE, AMYLASE in the last 168 hours. No results for input(s): AMMONIA in the last 168 hours. Coagulation Profile: No results for input(s): INR, PROTIME in the last 168 hours. Cardiac Enzymes: No results for input(s): CKTOTAL, CKMB, CKMBINDEX, TROPONINI in the last 168 hours. BNP (last 3 results) No results for input(s): PROBNP in the last 8760 hours. HbA1C: No results for input(s): HGBA1C in the last 72 hours. CBG: Recent Labs  Lab 01/11/21 1607 01/11/21 2034 01/12/21 0927 01/12/21 1116 01/12/21 1715  GLUCAP 157* 176* 102* 209* 127*  Lipid Profile: No results for input(s): CHOL, HDL, LDLCALC, TRIG, CHOLHDL, LDLDIRECT in the last 72 hours. Thyroid Function Tests: No results for input(s): TSH, T4TOTAL, FREET4, T3FREE, THYROIDAB in the last 72 hours. Anemia Panel: No results for input(s): VITAMINB12, FOLATE, FERRITIN, TIBC, IRON, RETICCTPCT in the last 72 hours. Sepsis Labs: No results for input(s): PROCALCITON, LATICACIDVEN in the last 168 hours.  Recent Results (from the past 240 hour(s))  Expectorated sputum assessment w rflx to resp cult     Status: None   Collection Time: 01/04/21 10:40 AM   Specimen: Expectorated Sputum  Result Value Ref Range Status   Specimen Description EXPECTORATED SPUTUM  Final   Special Requests NONE  Final   Sputum evaluation   Final    Sputum  specimen not acceptable for testing.  Please recollect.   NOTIFIED CHLOE ALLEN FOR RECOLLECT ON 01/04/21 AT 1153 QSD Performed at [facility], [address] [place], [place] [address]    Report Status 01/04/2021 FINAL  Final     Radiology Studies: No results found.  Scheduled Meds: . amiodarone  200 mg Oral Daily  . apixaban  5 mg Oral BID  . vitamin C  500 mg Oral Daily  . cholecalciferol  1,000 Units Oral Daily  . dexamethasone  6 mg Oral Daily  . famotidine  20 mg Oral BID  . finasteride  5 mg Oral Daily  . furosemide  40 mg Oral Daily  . guaiFENesin  600 mg Oral BID  . insulin aspart  0-15 Units Subcutaneous TID WC  . insulin glargine  10 Units Subcutaneous BID  . Ipratropium-Albuterol  1 puff Inhalation Q6H  . levothyroxine  100 mcg Oral Daily  . magic mouthwash w/lidocaine  10 mL Oral TID  . mouth rinse  15 mL Mouth Rinse BID  . sodium chloride flush  10 mL Intravenous Q12H  . tamsulosin  0.8 mg Oral Daily  . traZODone  50 mg Oral QHS  . zinc sulfate  220 mg Oral Daily   Continuous Infusions: . sodium chloride 5 mL/hr at 01/06/21 0800     LOS: 15 days     Enzo Bi, MD [facility]  If 7PM-7AM, please contact night-coverage [email or web address]  01/12/2021, 5:46 PM

## 2021-01-12 NOTE — TOC Transition Note (Signed)
Transition of Care [facility]) - CM/SW Discharge Note   Patient Details  Name: Nathaniel Ibarra MRN: [id number] Date of Birth: 12/12/1941  Transition of Care [facility]) CM/SW Contact:  Eileen Stanford, LCSW Phone Number: 01/12/2021, 4:02 PM   Clinical Narrative:   Pt d/c home tomorrow. Pt will have 02 and 3n1 delivered at bedside via [employer]. Pt will get [facility] services via [facility]. [place] notified.    Final next level of care: Home w Home Health Services Barriers to Discharge: No Barriers Identified   Patient Goals and CMS Choice Patient states their goals for this hospitalization and ongoing recovery are:: to go home   Choice offered to / list presented to : Patient  Discharge Placement                    Patient and family notified of of transfer: 01/12/21  Discharge Plan and Services In-house Referral: NA   Post Acute Care Choice: Home Health          DME Arranged: 3-N-1,Oxygen DME Agency: [employer] Date DME Agency Contacted: 01/12/21 Time DME Agency Contacted: [phone] Representative spoke with at DME Agency: [place]: PT,OT [place]: [place] ([place]) Date [place]: 01/12/21 Time [place]: 1602 Representative spoke with at [place]: [place] ([place]) Interventions     Readmission Risk Interventions Readmission Risk Prevention Plan 11/24/2020  Post Dischage Appt Complete  Medication Screening Complete  Transportation Screening Complete  Some recent data might be hidden

## 2021-01-13 DIAGNOSIS — R0602 Shortness of breath: Secondary | ICD-10-CM

## 2021-01-13 LAB — CBC
HCT: 39.7 % (ref 39.0–52.0)
Hemoglobin: 13.4 g/dL (ref 13.0–17.0)
MCH: 30.7 pg (ref 26.0–34.0)
MCHC: 33.8 g/dL (ref 30.0–36.0)
MCV: 90.8 fL (ref 80.0–100.0)
Platelets: 264 10*3/uL (ref 150–400)
RBC: 4.37 MIL/uL (ref 4.22–5.81)
RDW: 14.7 % (ref 11.5–15.5)
WBC: 23.3 10*3/uL — ABNORMAL HIGH (ref 4.0–10.5)
nRBC: 0 % (ref 0.0–0.2)

## 2021-01-13 LAB — GLUCOSE, CAPILLARY: Glucose-Capillary: 188 mg/dL — ABNORMAL HIGH (ref 70–99)

## 2021-01-13 LAB — BASIC METABOLIC PANEL
Anion gap: 9 (ref 5–15)
BUN: 42 mg/dL — ABNORMAL HIGH (ref 8–23)
CO2: 29 mmol/L (ref 22–32)
Calcium: 8.1 mg/dL — ABNORMAL LOW (ref 8.9–10.3)
Chloride: 98 mmol/L (ref 98–111)
Creatinine, Ser: 0.85 mg/dL (ref 0.61–1.24)
GFR, Estimated: >60 mL/min (ref >60)
Glucose, Bld: 145 mg/dL — ABNORMAL HIGH (ref 70–99)
Potassium: 4.7 mmol/L (ref 3.5–5.1)
Sodium: 136 mmol/L (ref 135–145)

## 2021-01-13 LAB — MAGNESIUM: Magnesium: 2.6 mg/dL — ABNORMAL HIGH (ref 1.7–2.4)

## 2021-01-13 MED ORDER — ACETAMINOPHEN 325 MG PO TABS: 650.0000 mg | ORAL_TABLET | Freq: Four times a day (QID) | ORAL | Status: DC | PRN

## 2021-01-13 NOTE — Plan of Care (Signed)
°  Problem: Clinical Measurements: Goal: Respiratory complications will improve Outcome: Progressing   Problem: Safety: Goal: Ability to remain free from injury will improve Outcome: Progressing   Problem: Coping: Goal: Psychosocial and spiritual needs will be supported Outcome: Progressing   Problem: Respiratory: Goal: Will maintain a patent airway Outcome: Progressing

## 2021-01-13 NOTE — Discharge Instructions (Signed)
Date of Positive COVID Test: 12/28/20  Date Isolation Ends: 01/18/21   COVID-19 Quarantine vs. Isolation QUARANTINE keeps someone who was in close contact with someone who has COVID-19 away from others. Quarantine if you have been in close contact with someone who has COVID-19, unless you have been fully vaccinated. If you are fully vaccinated  You do NOT need to quarantine unless they have symptoms  Get tested 3-5 days after your exposure, even if you don't have symptoms  Wear a mask indoors in public for 14 days following exposure or until your test result is negative If you are not fully vaccinated  Stay home for 14 days after your last contact with a person who has COVID-19  Watch for fever (100.20F), cough, shortness of breath, or other symptoms of COVID-19  If possible, stay away from people you live with, especially people who are at higher risk for getting very sick from COVID-19  Contact your local public health department for options in your area to possibly shorten your quarantine ISOLATION keeps someone who is sick or tested positive for COVID-19 without symptoms away from others, even in their own home. People who are in isolation should stay home and stay in a specific "sick room" or area and use a separate bathroom (if available). If you are sick and think or know you have COVID-19 Stay home until after  At least 10 days since symptoms first appeared and  At least 24 hours with no fever without the use of fever-reducing medications and  Symptoms have improved If you tested positive for COVID-19 but do not have symptoms  Stay home until after 10 days have passed since your positive viral test  If you develop symptoms after testing positive, follow the steps above for those who are sick [email or web address] 09/14/2020 This information is not intended to replace advice given to you by your health care provider. Make sure you discuss any questions you have with your  health care provider. Document Revised: 10/19/2020 Document Reviewed: 10/19/2020 Elsevier Patient Education  2021 [place].    Acute Respiratory Failure, Adult Acute respiratory failure is a condition that is a medical emergency. It can develop quickly, and it should be treated right away. There are two types of acute respiratory failure:  Type I respiratory failure is when the lungs are not able to get enough oxygen into the blood. This causes the blood oxygen level to drop.  Type II respiratory failure is when carbon dioxide is not passing from the lungs out of the body. This causes carbon dioxide to build up in the blood. A person may have one type of acute respiratory failure or have both types at the same time. What are the causes? Common causes of type I respiratory failure include:  Trauma to the lung, chest, ribs, or tissues around the lung.  Pneumonia.  Lung diseases, such as pulmonary fibrosis or asthma.  Smoke, chemical, or water inhalation.  A blood clot in the lungs (pulmonary embolism).  A blood infection (sepsis).  Heart attack. Common causes of type II respiratory failure include:  Stroke.  A spinal cord injury.  A drug or alcohol overdose.  A blood infection (sepsis).  Cardiac arrest. What increases the risk? This condition is more likely to develop in people who have:  Lung diseases such as asthma or chronic obstructive pulmonary disease (COPD).  A condition that damages or weakens the muscles, nerves, bones, or tissues that are involved in breathing, such as myasthenia gravis  or Guillain-Barr syndrome.  A serious infection.  A health problem that blocks the unconscious reflex that is involved in breathing, such as hypothyroidism or sleep apnea. What are the signs or symptoms? Trouble breathing is the main symptom of acute respiratory failure. Symptoms may also include:  Fast breathing.  Restlessness or anxiety.  Breathing loudly  (wheezing) and grunting.  Fast or irregular heartbeats (palpitations).  Confusion or changes in behavior.  Feeling tired (fatigue), sleeping more than normal, or being hard to wake.  Skin, lips, or fingernails that appear blue (cyanosis). How is this diagnosed? This condition may be diagnosed based on:  Your medical history and a physical exam. Your health care provider will listen to your heart and lungs to check for abnormal sounds.  Tests to confirm the diagnosis and determine the cause of respiratory failure. These tests may include: ? Measuring the amount of oxygen in your blood (pulse oximetry). The measurement comes from a small device that is placed on your finger, earlobe, or toe. ? Blood tests to measure blood oxygen and carbon dioxide and to look for signs of infection. ? Tests on a sample of the fluid that surrounds the spinal cord (cerebrospinal fluid) or a sample of fluid that is drawn from the windpipe (trachea) to check for infections. ? Chest X-ray. ? Electrocardiogram (ECG) to look at the heart's electrical activity.   How is this treated? Treatment for this condition usually takes place in a hospital intensive care unit (ICU). Treatment depends on what is causing the condition. It may include one or more of these treatments:  Oxygen may be given through your nose or a face mask.  A device such as a continuous positive airway pressure (CPAP) machine or bi-level positive airway pressure (BPAP) machine may be used to help you breathe. The device gives you oxygen and pressure.  Breathing treatments, fluids, and other medicines may be given.  A ventilator may be used to help you breathe. The machine gives you oxygen and pressure. A tube is put into your mouth and trachea to connect the ventilator. ? If this treatment is needed longer term, a tracheostomy may be placed. A tracheostomy is a breathing tube put through your neck into your trachea.  In extreme cases,  extracorporeal life support (ECLS) may be used. This treatment temporarily takes over the function of the heart and lungs, supplying oxygen and removing carbon dioxide. ECLS gives the lungs a chance to recover. Follow these instructions at home: Medicines  Take over-the-counter and prescription medicines only as told by your health care provider.  If you were prescribed an antibiotic medicine, take it as told by your health care provider. Do not stop using the antibiotic even if you start to feel better.  If you are taking blood thinners: ? Talk with your health care provider before you take any medicines that contain aspirin or NSAIDs, such as ibuprofen. These medicines increase your risk for dangerous bleeding. ? Take your medicine exactly as told, at the same time every day. ? Avoid activities that could cause injury or bruising, and follow instructions about how to prevent falls. ? Wear a medical alert bracelet or carry a card that lists what medicines you take. General instructions  Return to your normal activities as told by your health care provider. Ask your health care provider what activities are safe for you.  Do not use any products that contain nicotine or tobacco, such as cigarettes, e-cigarettes, and chewing tobacco. If you need help  quitting, ask your health care provider.  Do not drink alcohol if: ? Your health care provider tells you not to drink. ? You are pregnant, may be pregnant, or are planning to become pregnant.  Wear compression stockings as told by your health care provider. These stockings help to prevent blood clots and reduce swelling in your legs.  Attend any physical therapy and pulmonary rehabilitation as told by your health care provider.  Keep all follow-up visits as told by your health care provider. This is important. How is this prevented?  If you have an infection or a medical condition that may lead to acute respiratory failure, make sure you get  proper treatment. Contact a health care provider if:  You have a fever.  Your symptoms do not improve or they get worse. Get help right away if:  You are having trouble breathing.  You lose consciousness.  You develop a fast heart rate.  Your fingers, lips, or other areas turn blue.  You are confused. These symptoms may represent a serious problem that is an emergency. Do not wait to see if the symptoms will go away. Get medical help right away. Call your local emergency services (911 in the [place] Do not drive yourself to the hospital. Summary  Acute respiratory failure is a medical emergency. It can develop quickly, and it should be treated right away.  Treatment for this condition usually takes place in a hospital intensive care unit (ICU). Treatment may include oxygen, fluids, and medicines. A device may be used to help you breathe, such as a ventilator.  Take over-the-counter and prescription medicines only as told by your health care provider.  Contact a health care provider if your symptoms do not improve or if they get worse. This information is not intended to replace advice given to you by your health care provider. Make sure you discuss any questions you have with your health care provider. Document Revised: 11/22/2019 Document Reviewed: 11/22/2019 Elsevier Patient Education  [place].

## 2021-01-13 NOTE — Care Management Important Message (Signed)
Important Message  Patient Details  Name: Nathaniel Ibarra MRN: [id number] Date of Birth: 1941/09/26   Medicare Important Message Given:  Yes  Reviewed with patient via room phone due to isolation status.  Declined receiving copy of Medicare IM at this time as he has a copy in room already.     Dannette Barbara 01/13/2021, 10:06 AM

## 2021-01-13 NOTE — Progress Notes (Signed)
Nathaniel Ibarra to be D/C'd home per MD order.  Discussed prescriptions and follow up appointments with the patient's wife, [name]. Loaded four Oxygen tanks, walker and BSC into patient's vehicle.   Allergies as of 01/13/2021   No Known Allergies     Medication List    STOP taking these medications   PARoxetine 20 MG tablet Commonly known as: PAXIL     TAKE these medications   acetaminophen 325 MG tablet Commonly known as: TYLENOL Take 2 tablets (650 mg total) by mouth every 6 (six) hours as needed for mild pain or headache (fever >/= 101).   amiodarone 200 MG tablet Commonly known as: Pacerone Take 1 tablet (200 mg total) by mouth daily.   apixaban 5 MG Tabs tablet Commonly known as: ELIQUIS Take 1 tablet (5 mg total) by mouth 2 (two) times daily.   finasteride 5 MG tablet Commonly known as: PROSCAR Take 1 tablet (5 mg total) by mouth daily.   furosemide 40 MG tablet Commonly known as: LASIX Take 1 tablet (40 mg total) by mouth daily.   levothyroxine 100 MCG tablet Commonly known as: SYNTHROID Take 100 mcg by mouth daily.   losartan 50 MG tablet Commonly known as: COZAAR Take 1 tablet (50 mg total) by mouth daily.   polyethylene glycol 17 g packet Commonly known as: MIRALAX / GLYCOLAX Take 17 g by mouth daily as needed.   spironolactone 25 MG tablet Commonly known as: ALDACTONE Take 0.5 tablets (12.5 mg total) by mouth daily.   tamsulosin 0.4 MG Caps capsule Commonly known as: FLOMAX Take 0.8 mg by mouth at bedtime.   traMADol 50 MG tablet Commonly known as: ULTRAM Take 50 mg by mouth every 6 (six) hours.   traZODone 50 MG tablet Commonly known as: DESYREL Take 50 mg by mouth at bedtime.            Durable Medical Equipment  (From admission, onward)         Start     Ordered   01/13/21 1228  For home use only DME Walker  Once       Question Answer Comment  Patient needs a walker to treat with the following condition Physical  deconditioning   Patient needs a walker to treat with the following condition Pneumonia due to COVID-19 virus      01/13/21 1228   01/12/21 1601  For home use only DME 3 n 1  Once        01/12/21 1600   01/12/21 1554  For home use only DME oxygen  Once       Question Answer Comment  Length of Need 6 Months   Mode or (Route) Nasal cannula   Liters per Minute 4   Frequency Continuous (stationary and portable oxygen unit needed)   Oxygen delivery system Gas      01/12/21 1553   01/11/21 1639  For home use only DME Bedside commode  Once       Comments: 3 in 1  Question:  Patient needs a bedside commode to treat with the following condition  Answer:  Weakness   01/11/21 1638          Vitals:   01/13/21 1020 01/13/21 1541  BP: (!) 161/60 (!) 143/58  Pulse: 60 (!) 57  Resp: 19 17  Temp:  98 F (36.7 C)  SpO2: 92% 93%    Skin clean, dry and intact without evidence of skin break down, no evidence of skin  tears noted. IV catheter discontinued intact. Site without signs and symptoms of complications. Dressing and pressure applied. Pt denies pain at this time. No complaints noted.  An After Visit Summary was printed and given to the patient. Patient escorted via [place], and D/C home via private auto.  Fuller Mandril, RN

## 2021-01-13 NOTE — Discharge Summary (Signed)
DISCHARGE SUMMARY  Nathaniel Ibarra  MR#: [id number]  DOB:06-02-1941  Date of Admission: 12/28/2020 Date of Discharge: 01/13/2021  Attending Physician:Jeffrey Hennie Duos, MD  Patient's GHW:[id number], Nathaniel Loosen, MD  Consults:  Disposition: D/C home   Date of Positive COVID Test: 12/28/20  Date Isolation Ends: 01/18/21  COVID-19 specific Treatment: Remdesivir x 5  Actemra x 2 Steroid 10+ days   Follow-up Appts:  Follow-up Information    Nathaniel Marble, MD Follow up in 7 day(s).   Specialty: Internal Medicine Contact information: [place] [address] [phone]               Tests Needing Follow-up: - will need outpt f/u of O2 sats to determine when supplemental O2 can be stopped   Discharge Diagnoses: COVID Pneumonia Acute hypoxic respiratory failure Hemoptysis Paroxysmal atrial fibrillation HTN Hyperkalemia Hypothyroidism Steroid-induced hyperglycemia - not diabetic BPH   Initial presentation: 80 year old with a history of HTN, CAD status post PCI/stent, paroxysmal atrial fibrillation, CHF, BPH, and hypothyroidism who presented to the ED with shortness of breath which he stated had been worsening over a week's time. He had a known Covid exposure. In the ED he was found to be Covid positive and CXR noted bilateral pulmonary infiltrates. He was hypoxic with saturations in the 80s.  Hospital Course:  COVID Pneumonia -acute hypoxic respiratory failure completed a course of Remdesivir -was dosed with Actemra - completed a course of steroid prior to his d/c - CT negative for pulmonary embolism despite elevated D-dimer - home O2 arranged - pt in no resp distress at time of d/c and stable w/ only modest O2 support - will need outpt f/u of O2 sats to determine when supplemental O2 can be stopped   Hemoptysis developed while patient on Eliquis -with discontinuation of Eliquis hemoptysis resolved -has now been safely returned to Eliquis  dosing without recurrence of hemoptysis  Paroxysmal atrial fibrillation continue home amiodarone - chronically on Eliquis  HTN BP reasonably controlled   Hyperkalemia resolved with use of Kayexalate x1  Hypothyroidism continue usual Synthroid dose  steroid-induced hyperglycemia -not diabetic A1c 5.5  BPH continue usual Flomax and Proscar  Allergies as of 01/13/2021   No Known Allergies     Medication List    STOP taking these medications   PARoxetine 20 MG tablet Commonly known as: PAXIL     TAKE these medications   acetaminophen 325 MG tablet Commonly known as: TYLENOL Take 2 tablets (650 mg total) by mouth every 6 (six) hours as needed for mild pain or headache (fever >/= 101).   amiodarone 200 MG tablet Commonly known as: Pacerone Take 1 tablet (200 mg total) by mouth daily.   apixaban 5 MG Tabs tablet Commonly known as: ELIQUIS Take 1 tablet (5 mg total) by mouth 2 (two) times daily.   finasteride 5 MG tablet Commonly known as: PROSCAR Take 1 tablet (5 mg total) by mouth daily.   furosemide 40 MG tablet Commonly known as: LASIX Take 1 tablet (40 mg total) by mouth daily.   levothyroxine 100 MCG tablet Commonly known as: SYNTHROID Take 100 mcg by mouth daily.   losartan 50 MG tablet Commonly known as: COZAAR Take 1 tablet (50 mg total) by mouth daily.   polyethylene glycol 17 g packet Commonly known as: MIRALAX / GLYCOLAX Take 17 g by mouth daily as needed.   spironolactone 25 MG tablet Commonly known as: ALDACTONE Take 0.5 tablets (12.5 mg total) by mouth daily.  tamsulosin 0.4 MG Caps capsule Commonly known as: FLOMAX Take 0.8 mg by mouth at bedtime.   traMADol 50 MG tablet Commonly known as: ULTRAM Take 50 mg by mouth every 6 (six) hours.   traZODone 50 MG tablet Commonly known as: DESYREL Take 50 mg by mouth at bedtime.            Durable Medical Equipment  (From admission, onward)         Start     Ordered    01/13/21 1228  For home use only DME Walker  Once       Question Answer Comment  Patient needs a walker to treat with the following condition Physical deconditioning   Patient needs a walker to treat with the following condition Pneumonia due to COVID-19 virus      01/13/21 1228   01/12/21 1601  For home use only DME 3 n 1  Once        01/12/21 1600   01/12/21 1554  For home use only DME oxygen  Once       Question Answer Comment  Length of Need 6 Months   Mode or (Route) Nasal cannula   Liters per Minute 4   Frequency Continuous (stationary and portable oxygen unit needed)   Oxygen delivery system Gas      01/12/21 1553   01/11/21 1639  For home use only DME Bedside commode  Once       Comments: 3 in 1  Question:  Patient needs a bedside commode to treat with the following condition  Answer:  Weakness   01/11/21 1638          Day of Discharge BP (!) 143/58 (BP Location: Left Arm)   Pulse (!) 57   Temp 98 F (36.7 C) (Oral)   Resp 17   Ht 6' (1.829 m)   Wt 84.8 kg   SpO2 93%   BMI 25.36 kg/m   Physical Exam: General: No acute respiratory distress Lungs: fine scattered crackles - good air movement th/o all fields otherwise  Cardiovascular: Regular rate and rhythm without murmur gallop or rub normal S1 and S2 Abdomen: Nontender, nondistended, soft, bowel sounds positive, no rebound, no ascites, no appreciable mass Extremities: No significant cyanosis, clubbing, or edema bilateral lower extremities  Basic Metabolic Panel: Recent Labs  Lab 01/09/21 0443 01/10/21 0412 01/11/21 0313 01/12/21 0550 01/13/21 0612  NA 140 138 137 135 136  K 5.2* 5.2* 5.2* 4.6 4.7  CL 102 98 92* 96* 98  CO2 32 32 32 31 29  GLUCOSE 153* 137* 142* 151* 145*  BUN 31* 31* 37* 39* 42*  CREATININE 0.77 0.73 0.95 0.95 0.85  CALCIUM 8.2* 8.4* 8.4* 8.1* 8.1*  MG 2.6* 2.5* 2.6* 2.5* 2.6*    CBC: Recent Labs  Lab 01/09/21 0443 01/10/21 0412 01/11/21 0313 01/12/21 0550 01/13/21 0612   WBC 28.7* 30.9* 32.2* 27.6* 23.3*  HGB 12.6* 13.1 14.2 13.3 13.4  HCT 38.0* 39.3 41.4 38.9* 39.7  MCV 93.1 92.3 90.6 90.9 90.8  PLT 243 252 277 259 264    BNP (last 3 results) Recent Labs    05/27/20 1302 12/28/20 1148 12/28/20 2047  BNP 208.7* 179.7* 182.5*    CBG: Recent Labs  Lab 01/12/21 0927 01/12/21 1116 01/12/21 1715 01/12/21 2045 01/13/21 0844  GLUCAP 102* 209* 127* 193* 188*    Recent Results (from the past 240 hour(s))  Expectorated sputum assessment w rflx to resp cult  Status: None   Collection Time: 01/04/21 10:40 AM   Specimen: Expectorated Sputum  Result Value Ref Range Status   Specimen Description EXPECTORATED SPUTUM  Final   Special Requests NONE  Final   Sputum evaluation   Final    Sputum specimen not acceptable for testing.  Please recollect.   NOTIFIED CHLOE ALLEN FOR RECOLLECT ON 01/04/21 AT 61 QSD Performed at [facility], [address], [place], [place] [address]    Report Status 01/04/2021 FINAL  Final      Time spent in discharge (includes decision making & examination of pt): 35 minutes  01/13/2021, 5:26 PM   Cherene Altes, MD [facility] Office  [phone]

## 2021-01-15 DIAGNOSIS — I252 Old myocardial infarction: Secondary | ICD-10-CM | POA: Diagnosis not present

## 2021-01-15 DIAGNOSIS — R739 Hyperglycemia, unspecified: Secondary | ICD-10-CM | POA: Diagnosis not present

## 2021-01-15 DIAGNOSIS — I2510 Atherosclerotic heart disease of native coronary artery without angina pectoris: Secondary | ICD-10-CM | POA: Diagnosis not present

## 2021-01-15 DIAGNOSIS — I480 Paroxysmal atrial fibrillation: Secondary | ICD-10-CM | POA: Diagnosis not present

## 2021-01-15 DIAGNOSIS — U071 COVID-19: Secondary | ICD-10-CM | POA: Diagnosis not present

## 2021-01-15 DIAGNOSIS — N400 Enlarged prostate without lower urinary tract symptoms: Secondary | ICD-10-CM | POA: Diagnosis not present

## 2021-01-15 DIAGNOSIS — J1282 Pneumonia due to coronavirus disease 2019: Secondary | ICD-10-CM | POA: Diagnosis not present

## 2021-01-15 DIAGNOSIS — E039 Hypothyroidism, unspecified: Secondary | ICD-10-CM | POA: Diagnosis not present

## 2021-01-15 DIAGNOSIS — K219 Gastro-esophageal reflux disease without esophagitis: Secondary | ICD-10-CM | POA: Diagnosis not present

## 2021-01-15 DIAGNOSIS — F32A Depression, unspecified: Secondary | ICD-10-CM | POA: Diagnosis not present

## 2021-01-15 DIAGNOSIS — I509 Heart failure, unspecified: Secondary | ICD-10-CM | POA: Diagnosis not present

## 2021-01-15 DIAGNOSIS — J9601 Acute respiratory failure with hypoxia: Secondary | ICD-10-CM | POA: Diagnosis not present

## 2021-01-15 DIAGNOSIS — I110 Hypertensive heart disease with heart failure: Secondary | ICD-10-CM | POA: Diagnosis not present

## 2021-01-19 DIAGNOSIS — J9601 Acute respiratory failure with hypoxia: Secondary | ICD-10-CM | POA: Diagnosis not present

## 2021-01-19 DIAGNOSIS — I110 Hypertensive heart disease with heart failure: Secondary | ICD-10-CM | POA: Diagnosis not present

## 2021-01-19 DIAGNOSIS — U071 COVID-19: Secondary | ICD-10-CM | POA: Diagnosis not present

## 2021-01-19 DIAGNOSIS — I480 Paroxysmal atrial fibrillation: Secondary | ICD-10-CM | POA: Diagnosis not present

## 2021-01-19 DIAGNOSIS — E039 Hypothyroidism, unspecified: Secondary | ICD-10-CM | POA: Diagnosis not present

## 2021-01-19 DIAGNOSIS — I2510 Atherosclerotic heart disease of native coronary artery without angina pectoris: Secondary | ICD-10-CM | POA: Diagnosis not present

## 2021-01-19 DIAGNOSIS — K219 Gastro-esophageal reflux disease without esophagitis: Secondary | ICD-10-CM | POA: Diagnosis not present

## 2021-01-19 DIAGNOSIS — R739 Hyperglycemia, unspecified: Secondary | ICD-10-CM | POA: Diagnosis not present

## 2021-01-19 DIAGNOSIS — F32A Depression, unspecified: Secondary | ICD-10-CM | POA: Diagnosis not present

## 2021-01-19 DIAGNOSIS — Z7689 Persons encountering health services in other specified circumstances: Secondary | ICD-10-CM | POA: Diagnosis not present

## 2021-01-19 DIAGNOSIS — N400 Enlarged prostate without lower urinary tract symptoms: Secondary | ICD-10-CM | POA: Diagnosis not present

## 2021-01-19 DIAGNOSIS — I252 Old myocardial infarction: Secondary | ICD-10-CM | POA: Diagnosis not present

## 2021-01-19 DIAGNOSIS — I509 Heart failure, unspecified: Secondary | ICD-10-CM | POA: Diagnosis not present

## 2021-01-19 DIAGNOSIS — J1282 Pneumonia due to coronavirus disease 2019: Secondary | ICD-10-CM | POA: Diagnosis not present

## 2021-01-20 DIAGNOSIS — G4700 Insomnia, unspecified: Secondary | ICD-10-CM | POA: Diagnosis not present

## 2021-01-20 DIAGNOSIS — F321 Major depressive disorder, single episode, moderate: Secondary | ICD-10-CM | POA: Diagnosis not present

## 2021-01-20 DIAGNOSIS — N400 Enlarged prostate without lower urinary tract symptoms: Secondary | ICD-10-CM | POA: Diagnosis not present

## 2021-01-20 DIAGNOSIS — I4891 Unspecified atrial fibrillation: Secondary | ICD-10-CM | POA: Diagnosis not present

## 2021-01-20 DIAGNOSIS — I2510 Atherosclerotic heart disease of native coronary artery without angina pectoris: Secondary | ICD-10-CM | POA: Diagnosis not present

## 2021-01-20 DIAGNOSIS — M13862 Other specified arthritis, left knee: Secondary | ICD-10-CM | POA: Diagnosis not present

## 2021-01-20 DIAGNOSIS — U071 COVID-19: Secondary | ICD-10-CM | POA: Diagnosis not present

## 2021-01-20 DIAGNOSIS — E039 Hypothyroidism, unspecified: Secondary | ICD-10-CM | POA: Diagnosis not present

## 2021-02-13 DIAGNOSIS — G4733 Obstructive sleep apnea (adult) (pediatric): Secondary | ICD-10-CM | POA: Diagnosis not present

## 2021-02-13 DIAGNOSIS — I5032 Chronic diastolic (congestive) heart failure: Secondary | ICD-10-CM | POA: Diagnosis not present

## 2021-02-13 DIAGNOSIS — U071 COVID-19: Secondary | ICD-10-CM | POA: Diagnosis not present

## 2021-02-13 DIAGNOSIS — R062 Wheezing: Secondary | ICD-10-CM | POA: Diagnosis not present

## 2021-02-16 DIAGNOSIS — I252 Old myocardial infarction: Secondary | ICD-10-CM | POA: Diagnosis not present

## 2021-02-16 DIAGNOSIS — I110 Hypertensive heart disease with heart failure: Secondary | ICD-10-CM | POA: Diagnosis not present

## 2021-02-16 DIAGNOSIS — I480 Paroxysmal atrial fibrillation: Secondary | ICD-10-CM | POA: Diagnosis not present

## 2021-02-16 DIAGNOSIS — N400 Enlarged prostate without lower urinary tract symptoms: Secondary | ICD-10-CM | POA: Diagnosis not present

## 2021-02-16 DIAGNOSIS — I509 Heart failure, unspecified: Secondary | ICD-10-CM | POA: Diagnosis not present

## 2021-02-16 DIAGNOSIS — R739 Hyperglycemia, unspecified: Secondary | ICD-10-CM | POA: Diagnosis not present

## 2021-02-16 DIAGNOSIS — E039 Hypothyroidism, unspecified: Secondary | ICD-10-CM | POA: Diagnosis not present

## 2021-02-16 DIAGNOSIS — K219 Gastro-esophageal reflux disease without esophagitis: Secondary | ICD-10-CM | POA: Diagnosis not present

## 2021-02-16 DIAGNOSIS — I2510 Atherosclerotic heart disease of native coronary artery without angina pectoris: Secondary | ICD-10-CM | POA: Diagnosis not present

## 2021-02-16 DIAGNOSIS — J9601 Acute respiratory failure with hypoxia: Secondary | ICD-10-CM | POA: Diagnosis not present

## 2021-02-16 DIAGNOSIS — F32A Depression, unspecified: Secondary | ICD-10-CM | POA: Diagnosis not present

## 2021-02-16 DIAGNOSIS — J1282 Pneumonia due to coronavirus disease 2019: Secondary | ICD-10-CM | POA: Diagnosis not present

## 2021-02-16 DIAGNOSIS — U071 COVID-19: Secondary | ICD-10-CM | POA: Diagnosis not present

## 2021-03-13 DIAGNOSIS — R062 Wheezing: Secondary | ICD-10-CM | POA: Diagnosis not present

## 2021-03-13 DIAGNOSIS — G4733 Obstructive sleep apnea (adult) (pediatric): Secondary | ICD-10-CM | POA: Diagnosis not present

## 2021-03-13 DIAGNOSIS — I5032 Chronic diastolic (congestive) heart failure: Secondary | ICD-10-CM | POA: Diagnosis not present

## 2021-03-13 DIAGNOSIS — U071 COVID-19: Secondary | ICD-10-CM | POA: Diagnosis not present

## 2021-03-18 DIAGNOSIS — I5022 Chronic systolic (congestive) heart failure: Secondary | ICD-10-CM | POA: Diagnosis not present

## 2021-03-18 DIAGNOSIS — F4321 Adjustment disorder with depressed mood: Secondary | ICD-10-CM | POA: Diagnosis not present

## 2021-03-18 DIAGNOSIS — R001 Bradycardia, unspecified: Secondary | ICD-10-CM | POA: Diagnosis not present

## 2021-03-18 DIAGNOSIS — I2510 Atherosclerotic heart disease of native coronary artery without angina pectoris: Secondary | ICD-10-CM | POA: Diagnosis not present

## 2021-03-18 DIAGNOSIS — I480 Paroxysmal atrial fibrillation: Secondary | ICD-10-CM | POA: Diagnosis not present

## 2021-03-18 DIAGNOSIS — I447 Left bundle-branch block, unspecified: Secondary | ICD-10-CM | POA: Diagnosis not present

## 2021-03-18 DIAGNOSIS — I5043 Acute on chronic combined systolic (congestive) and diastolic (congestive) heart failure: Secondary | ICD-10-CM | POA: Diagnosis not present

## 2021-03-18 DIAGNOSIS — J9601 Acute respiratory failure with hypoxia: Secondary | ICD-10-CM | POA: Diagnosis not present

## 2021-03-18 DIAGNOSIS — I10 Essential (primary) hypertension: Secondary | ICD-10-CM | POA: Diagnosis not present

## 2021-04-13 DIAGNOSIS — G4733 Obstructive sleep apnea (adult) (pediatric): Secondary | ICD-10-CM | POA: Diagnosis not present

## 2021-04-13 DIAGNOSIS — U071 COVID-19: Secondary | ICD-10-CM | POA: Diagnosis not present

## 2021-04-13 DIAGNOSIS — I5032 Chronic diastolic (congestive) heart failure: Secondary | ICD-10-CM | POA: Diagnosis not present

## 2021-04-13 DIAGNOSIS — R062 Wheezing: Secondary | ICD-10-CM | POA: Diagnosis not present

## 2021-05-13 DIAGNOSIS — R3915 Urgency of urination: Secondary | ICD-10-CM | POA: Diagnosis not present

## 2021-05-13 DIAGNOSIS — U071 COVID-19: Secondary | ICD-10-CM | POA: Diagnosis not present

## 2021-05-13 DIAGNOSIS — R350 Frequency of micturition: Secondary | ICD-10-CM | POA: Diagnosis not present

## 2021-05-13 DIAGNOSIS — I5032 Chronic diastolic (congestive) heart failure: Secondary | ICD-10-CM | POA: Diagnosis not present

## 2021-05-13 DIAGNOSIS — R062 Wheezing: Secondary | ICD-10-CM | POA: Diagnosis not present

## 2021-05-13 DIAGNOSIS — G4733 Obstructive sleep apnea (adult) (pediatric): Secondary | ICD-10-CM | POA: Diagnosis not present

## 2021-05-13 DIAGNOSIS — N401 Enlarged prostate with lower urinary tract symptoms: Secondary | ICD-10-CM | POA: Diagnosis not present

## 2021-05-13 DIAGNOSIS — N3941 Urge incontinence: Secondary | ICD-10-CM | POA: Diagnosis not present

## 2021-06-07 DIAGNOSIS — M1712 Unilateral primary osteoarthritis, left knee: Secondary | ICD-10-CM | POA: Diagnosis not present

## 2021-06-09 DIAGNOSIS — M12811 Other specific arthropathies, not elsewhere classified, right shoulder: Secondary | ICD-10-CM | POA: Diagnosis not present

## 2021-06-09 DIAGNOSIS — M25511 Pain in right shoulder: Secondary | ICD-10-CM | POA: Diagnosis not present

## 2021-06-16 DIAGNOSIS — I5022 Chronic systolic (congestive) heart failure: Secondary | ICD-10-CM | POA: Diagnosis not present

## 2021-06-16 DIAGNOSIS — I480 Paroxysmal atrial fibrillation: Secondary | ICD-10-CM | POA: Diagnosis not present

## 2021-06-16 DIAGNOSIS — I10 Essential (primary) hypertension: Secondary | ICD-10-CM | POA: Diagnosis not present

## 2021-06-16 DIAGNOSIS — I5043 Acute on chronic combined systolic (congestive) and diastolic (congestive) heart failure: Secondary | ICD-10-CM | POA: Diagnosis not present

## 2021-06-16 DIAGNOSIS — R001 Bradycardia, unspecified: Secondary | ICD-10-CM | POA: Diagnosis not present

## 2021-09-23 DIAGNOSIS — I2510 Atherosclerotic heart disease of native coronary artery without angina pectoris: Secondary | ICD-10-CM | POA: Diagnosis not present

## 2021-09-23 DIAGNOSIS — I10 Essential (primary) hypertension: Secondary | ICD-10-CM | POA: Diagnosis not present

## 2021-09-23 DIAGNOSIS — R001 Bradycardia, unspecified: Secondary | ICD-10-CM | POA: Diagnosis not present

## 2021-09-23 DIAGNOSIS — I5043 Acute on chronic combined systolic (congestive) and diastolic (congestive) heart failure: Secondary | ICD-10-CM | POA: Diagnosis not present

## 2021-09-23 DIAGNOSIS — I480 Paroxysmal atrial fibrillation: Secondary | ICD-10-CM | POA: Diagnosis not present

## 2021-09-23 DIAGNOSIS — Z79899 Other long term (current) drug therapy: Secondary | ICD-10-CM | POA: Diagnosis not present

## 2021-09-23 DIAGNOSIS — I5022 Chronic systolic (congestive) heart failure: Secondary | ICD-10-CM | POA: Diagnosis not present

## 2021-09-23 DIAGNOSIS — J9601 Acute respiratory failure with hypoxia: Secondary | ICD-10-CM | POA: Diagnosis not present

## 2021-09-23 DIAGNOSIS — I447 Left bundle-branch block, unspecified: Secondary | ICD-10-CM | POA: Diagnosis not present

## 2021-10-27 DIAGNOSIS — R059 Cough, unspecified: Secondary | ICD-10-CM | POA: Diagnosis not present

## 2021-10-27 DIAGNOSIS — J209 Acute bronchitis, unspecified: Secondary | ICD-10-CM | POA: Diagnosis not present

## 2021-11-10 DIAGNOSIS — E782 Mixed hyperlipidemia: Secondary | ICD-10-CM | POA: Diagnosis not present

## 2021-11-10 DIAGNOSIS — E039 Hypothyroidism, unspecified: Secondary | ICD-10-CM | POA: Diagnosis not present

## 2021-11-10 DIAGNOSIS — I10 Essential (primary) hypertension: Secondary | ICD-10-CM | POA: Diagnosis not present

## 2021-11-10 DIAGNOSIS — R7301 Impaired fasting glucose: Secondary | ICD-10-CM | POA: Diagnosis not present

## 2021-11-16 DIAGNOSIS — N400 Enlarged prostate without lower urinary tract symptoms: Secondary | ICD-10-CM | POA: Diagnosis not present

## 2021-11-16 DIAGNOSIS — E039 Hypothyroidism, unspecified: Secondary | ICD-10-CM | POA: Diagnosis not present

## 2021-11-16 DIAGNOSIS — R7303 Prediabetes: Secondary | ICD-10-CM | POA: Diagnosis not present

## 2021-11-16 DIAGNOSIS — G4700 Insomnia, unspecified: Secondary | ICD-10-CM | POA: Diagnosis not present

## 2021-11-16 DIAGNOSIS — I2510 Atherosclerotic heart disease of native coronary artery without angina pectoris: Secondary | ICD-10-CM | POA: Diagnosis not present

## 2021-11-16 DIAGNOSIS — M13862 Other specified arthritis, left knee: Secondary | ICD-10-CM | POA: Diagnosis not present

## 2021-11-16 DIAGNOSIS — F321 Major depressive disorder, single episode, moderate: Secondary | ICD-10-CM | POA: Diagnosis not present

## 2021-11-16 DIAGNOSIS — I4891 Unspecified atrial fibrillation: Secondary | ICD-10-CM | POA: Diagnosis not present

## 2021-12-24 ENCOUNTER — Ambulatory Visit: Payer: PPO | Admitting: Podiatry

## 2021-12-24 ENCOUNTER — Other Ambulatory Visit: Payer: Self-pay

## 2021-12-24 ENCOUNTER — Encounter: Payer: Self-pay | Admitting: Podiatry

## 2021-12-24 DIAGNOSIS — M79674 Pain in right toe(s): Secondary | ICD-10-CM | POA: Diagnosis not present

## 2021-12-24 DIAGNOSIS — M79675 Pain in left toe(s): Secondary | ICD-10-CM

## 2021-12-24 DIAGNOSIS — L989 Disorder of the skin and subcutaneous tissue, unspecified: Secondary | ICD-10-CM | POA: Diagnosis not present

## 2021-12-24 DIAGNOSIS — B351 Tinea unguium: Secondary | ICD-10-CM | POA: Diagnosis not present

## 2021-12-24 NOTE — Progress Notes (Signed)
° °  SUBJECTIVE Patient presents to office today complaining of elongated, thickened nails that cause pain while ambulating in shoes.  Patient is unable to trim their own nails.  Patient also states that he has a symptomatic callus/corn to the left fifth toe.  This is been present for the last few months.  He presents for further treatment evaluation   Past Medical History:  Diagnosis Date   Bladder cancer ([facility])    BPH (benign prostatic hyperplasia)    CHF (congestive heart failure) ([place])    GERD (gastroesophageal reflux disease)    Hypertension    Myocardial infarction [facility])    cath with stent in 2016   Thyroid disease     OBJECTIVE General Patient is awake, alert, and oriented x 3 and in no acute distress. Derm Skin is dry and supple bilateral. Negative open lesions or macerations. Remaining integument unremarkable. Nails are tender, long, thickened and dystrophic with subungual debris, consistent with onychomycosis, 1-5 bilateral. No signs of infection noted.  Symptomatic hyperkeratotic corn/callus noted to the dorsal lateral aspect of the left fifth toe with a central nucleated core Vasc  DP and PT pedal pulses palpable bilaterally. Temperature gradient within normal limits.  Neuro Epicritic and protective threshold sensation grossly intact bilaterally.  Musculoskeletal Exam No symptomatic pedal deformities noted bilateral. Muscular strength within normal limits.  ASSESSMENT 1.  Pain due to onychomycosis of toenails both 2.  Symptomatic corn left fifth toe  PLAN OF CARE 1. Patient evaluated today.  2. Instructed to maintain good pedal hygiene and foot care.  3. Mechanical debridement of nails 1-5 bilaterally performed using a nail nipper. Filed with dremel without incident.  4.  Excisional debridement of the hyperkeratotic callus/corn was performed using a 312 scalpel without incident or bleeding and patient felt immediate relief  5.  Return to clinic in 3 mos. for routine foot  care   Edrick Kins, DPM [facility]  Dr. Edrick Kins, DPM    [address], [place] [address]                Office [phone]  Fax [phone]

## 2022-01-05 DIAGNOSIS — M1712 Unilateral primary osteoarthritis, left knee: Secondary | ICD-10-CM | POA: Diagnosis not present

## 2022-01-26 ENCOUNTER — Ambulatory Visit: Payer: PPO | Admitting: Dermatology

## 2022-01-26 ENCOUNTER — Other Ambulatory Visit: Payer: Self-pay

## 2022-01-26 DIAGNOSIS — C4492 Squamous cell carcinoma of skin, unspecified: Secondary | ICD-10-CM

## 2022-01-26 DIAGNOSIS — L820 Inflamed seborrheic keratosis: Secondary | ICD-10-CM

## 2022-01-26 DIAGNOSIS — D0462 Carcinoma in situ of skin of left upper limb, including shoulder: Secondary | ICD-10-CM | POA: Diagnosis not present

## 2022-01-26 DIAGNOSIS — L821 Other seborrheic keratosis: Secondary | ICD-10-CM

## 2022-01-26 DIAGNOSIS — L578 Other skin changes due to chronic exposure to nonionizing radiation: Secondary | ICD-10-CM | POA: Diagnosis not present

## 2022-01-26 DIAGNOSIS — D692 Other nonthrombocytopenic purpura: Secondary | ICD-10-CM

## 2022-01-26 DIAGNOSIS — Z1283 Encounter for screening for malignant neoplasm of skin: Secondary | ICD-10-CM | POA: Diagnosis not present

## 2022-01-26 DIAGNOSIS — L570 Actinic keratosis: Secondary | ICD-10-CM

## 2022-01-26 DIAGNOSIS — D1800 Hemangioma unspecified site: Secondary | ICD-10-CM

## 2022-01-26 DIAGNOSIS — D229 Melanocytic nevi, unspecified: Secondary | ICD-10-CM

## 2022-01-26 DIAGNOSIS — D485 Neoplasm of uncertain behavior of skin: Secondary | ICD-10-CM

## 2022-01-26 DIAGNOSIS — L814 Other melanin hyperpigmentation: Secondary | ICD-10-CM

## 2022-01-26 HISTORY — DX: Squamous cell carcinoma of skin, unspecified: C44.92

## 2022-01-26 NOTE — Patient Instructions (Addendum)
Electrodesiccation and Curettage (Scrape and Burn) Wound Care Instructions  Leave the original bandage on for 24 hours if possible.  If the bandage becomes soaked or soiled before that time, it is OK to remove it and examine the wound.  A small amount of post-operative bleeding is normal.  If excessive bleeding occurs, remove the bandage, place gauze over the site and apply continuous pressure (no peeking) over the area for 30 minutes. If this does not work, please call our clinic as soon as possible or page your doctor if it is after hours.   Once a day, cleanse the wound with soap and water. It is fine to shower. If a thick crust develops you may use a Q-tip dipped into dilute hydrogen peroxide (mix 1:1 with water) to dissolve it.  Hydrogen peroxide can slow the healing process, so use it only as needed.    After washing, apply petroleum jelly (Vaseline) or an antibiotic ointment if your doctor prescribed one for you, followed by a bandage.    For best healing, the wound should be covered with a layer of ointment at all times. If you are not able to keep the area covered with a bandage to hold the ointment in place, this may mean re-applying the ointment several times a day.  Continue this wound care until the wound has healed and is no longer open. It may take several weeks for the wound to heal and close.  Itching and mild discomfort is normal during the healing process.  If you have any discomfort, you can take Tylenol (acetaminophen) or ibuprofen as directed on the bottle. (Please do not take these if you have an allergy to them or cannot take them for another reason).  Some redness, tenderness and white or yellow material in the wound is normal healing.  If the area becomes very sore and red, or develops a thick yellow-green material (pus), it may be infected; please notify us.    Wound healing continues for up to one year following surgery. It is not unusual to experience pain in the scar  from time to time during the interval.  If the pain becomes severe or the scar thickens, you should notify the office.    A slight amount of redness in a scar is expected for the first six months.  After six months, the redness will fade and the scar will soften and fade.  The color difference becomes less noticeable with time.  If there are any problems, return for a post-op surgery check at your earliest convenience.  To improve the appearance of the scar, you can use silicone scar gel, cream, or sheets (such as Mederma or Serica) every night for up to one year. These are available over the counter (without a prescription).  Please call our office at [phone] for any questions or concerns.  If You Need Anything After Your Visit  If you have any questions or concerns for your doctor, please call our main line at [phone] and press option 4 to reach your doctor's medical assistant. If no one answers, please leave a voicemail as directed and we will return your call as soon as possible. Messages left after 4 pm will be answered the following business day.   You may also send [place] a message via [place]. We typically respond to MyChart messages within 1-2 business days.  For prescription refills, please ask your pharmacy to contact our office. Our fax number is [phone].  If you have an  urgent issue when the clinic is closed that cannot wait until the next business day, you can page your doctor at the number below.    Please note that while we do our best to be available for urgent issues outside of office hours, we are not available 24/7.   If you have an urgent issue and are unable to reach [place], you may choose to seek medical care at your doctor's office, retail clinic, urgent care center, or emergency room.  If you have a medical emergency, please immediately call 911 or go to the emergency department.  Pager Numbers  - Dr. Nehemiah Massed: [phone]  - Dr. Laurence Ferrari: [phone]  -  Dr. Nicole Kindred: [phone]  In the event of inclement weather, please call our main line at [phone] for an update on the status of any delays or closures.  Dermatology Medication Tips: Please keep the boxes that topical medications come in in order to help keep track of the instructions about where and how to use these. Pharmacies typically print the medication instructions only on the boxes and not directly on the medication tubes.   If your medication is too expensive, please contact our office at [phone] option 4 or send [place] a message through [place].   We are unable to tell what your co-pay for medications will be in advance as this is different depending on your insurance coverage. However, we may be able to find a substitute medication at lower cost or fill out paperwork to get insurance to cover a needed medication.   If a prior authorization is required to get your medication covered by your insurance company, please allow [place] 1-2 business days to complete this process.  Drug prices often vary depending on where the prescription is filled and some pharmacies may offer cheaper prices.  The website www.goodrx.com contains coupons for medications through different pharmacies. The prices here do not account for what the cost may be with help from insurance (it may be cheaper with your insurance), but the website can give you the price if you did not use any insurance.  - You can print the associated coupon and take it with your prescription to the pharmacy.  - You may also stop by our office during regular business hours and pick up a GoodRx coupon card.  - If you need your prescription sent electronically to a different pharmacy, notify our office through [facility] or by phone at [phone] option 4.     Si Usted Necesita Algo Despus de Su Visita  Tambin puede enviarnos un mensaje a travs de Pharmacist, community. Por lo general respondemos a los mensajes de MyChart en el  transcurso de 1 a 2 das hbiles.  Para renovar recetas, por favor pida a su farmacia que se ponga en contacto con nuestra oficina. Harland Dingwall de fax es [place] [phone].  Si tiene un asunto urgente cuando la clnica est cerrada y que no puede esperar hasta el siguiente da hbil, puede llamar/localizar a su doctor(a) al nmero que aparece a continuacin.   Por favor, tenga en cuenta que aunque hacemos todo lo posible para estar disponibles para asuntos urgentes fuera del horario de [place], no estamos disponibles las 24 horas del da, los 7 das de la [place].   Si tiene un problema urgente y no puede comunicarse con nosotros, puede optar por buscar atencin mdica  en el consultorio de su doctor(a), en una clnica privada, en un centro de atencin urgente o en una sala de emergencias.  Si tiene Engineering geologist, por favor llame inmediatamente al 911 o vaya a la sala de emergencias.  Nmeros de bper  - Dr. Nehemiah Massed: [phone]  - Dra. Moye: [phone]  - Dra. Nicole Kindred: [phone]  En caso de inclemencias del [place], por favor llame a Johnsie Kindred principal al [phone] para una actualizacin sobre el [place] de cualquier retraso o cierre.  Consejos para la medicacin en dermatologa: Por favor, guarde las cajas en las que vienen los medicamentos de uso tpico para ayudarle a seguir las instrucciones sobre dnde y cmo usarlos. Las farmacias generalmente imprimen las instrucciones del medicamento slo en las cajas y no directamente en los tubos del [place].   Si su medicamento es muy caro, por favor, pngase en contacto con Zigmund Daniel llamando al [phone] y presione la opcin 4 o envenos un mensaje a travs de Pharmacist, community.   No podemos decirle cul ser su copago por los medicamentos por adelantado ya que esto es diferente dependiendo de la cobertura de su seguro. Sin embargo, es posible que podamos encontrar un medicamento sustituto a Electrical engineer un  formulario para que el seguro cubra el medicamento que se considera necesario.   Si se requiere una autorizacin previa para que su compaa de seguros [place] su medicamento, por favor permtanos de 1 a 2 das hbiles para completar este proceso.  Los precios de los medicamentos varan con frecuencia dependiendo del Environmental consultant de dnde se surte la receta y alguna farmacias pueden ofrecer precios ms baratos.  El sitio web www.goodrx.com tiene cupones para medicamentos de Airline pilot. Los precios aqu no tienen en cuenta lo que podra costar con la ayuda del seguro (puede ser ms barato con su seguro), pero el sitio web puede darle el precio si no utiliz Research scientist (physical sciences).  - Puede imprimir el cupn correspondiente y llevarlo con su receta a la farmacia.  - Tambin puede pasar por nuestra oficina durante el horario de atencin regular y Charity fundraiser una tarjeta de cupones de GoodRx.  - Si necesita que su receta se enve electrnicamente a una farmacia diferente, informe a nuestra oficina a travs de MyChart de [place] o por telfono llamando al [phone] y presione la opcin 4.

## 2022-01-26 NOTE — Progress Notes (Signed)
Follow-Up Visit   Subjective  Nathaniel Ibarra is a 81 y.o. male who presents for the following: UBSE (Patient has noticed lesions on his L arm, R ear, and R neck that he is concerned about and would like treated.). The patient presents for Upper Body Skin Exam (UBSE) for skin cancer screening and mole check.  The patient has spots, moles and lesions to be evaluated, some may be new or changing.  The following portions of the chart were reviewed this encounter and updated as appropriate:   Tobacco   Allergies   Meds   Problems   Med Hx   Surg Hx   Fam Hx      Review of Systems:  No other skin or systemic complaints except as noted in HPI or Assessment and Plan.  Objective  Well appearing patient in no apparent distress; mood and affect are within normal limits.  All skin waist up examined.  R ear anti helix x 1, R temple x 1 (2) Erythematous thin papules/macules with gritty scale.   L bicep x 1 Erythematous stuck-on, waxy papule or plaque  L mid dorsum forearm 1.2 hyperkeratotic papule.   Assessment & Plan  AK (actinic keratosis) (2) R ear anti helix x 1, R temple x 1  Destruction of lesion - R ear anti helix x 1, R temple x 1 Complexity: simple   Destruction method: cryotherapy   Informed consent: discussed and consent obtained   Timeout:  patient name, date of birth, surgical site, and procedure verified Lesion destroyed using liquid nitrogen: Yes   Region frozen until ice ball extended beyond lesion: Yes   Outcome: patient tolerated procedure well with no complications   Post-procedure details: wound care instructions given    Inflamed seborrheic keratosis L bicep x 1  Destruction of lesion - L bicep x 1 Complexity: simple   Destruction method: cryotherapy   Informed consent: discussed and consent obtained   Timeout:  patient name, date of birth, surgical site, and procedure verified Lesion destroyed using liquid nitrogen: Yes   Region frozen until ice ball  extended beyond lesion: Yes   Outcome: patient tolerated procedure well with no complications   Post-procedure details: wound care instructions given    Neoplasm of uncertain behavior of skin L mid dorsum forearm  Epidermal / dermal shaving  Lesion diameter (cm):  1.2 Informed consent: discussed and consent obtained   Timeout: patient name, date of birth, surgical site, and procedure verified   Procedure prep:  Patient was prepped and draped in usual sterile fashion Prep type:  Isopropyl alcohol Anesthesia: the lesion was anesthetized in a standard fashion   Anesthetic:  1% lidocaine w/ epinephrine 1-100,000 buffered w/ 8.4% NaHCO3 Instrument used: flexible razor blade   Hemostasis achieved with: pressure, aluminum chloride and electrodesiccation   Outcome: patient tolerated procedure well   Post-procedure details: sterile dressing applied and wound care instructions given   Dressing type: bandage and petrolatum    Destruction of lesion Complexity: extensive   Destruction method: electrodesiccation and curettage   Informed consent: discussed and consent obtained   Timeout:  patient name, date of birth, surgical site, and procedure verified Procedure prep:  Patient was prepped and draped in usual sterile fashion Prep type:  Isopropyl alcohol Anesthesia: the lesion was anesthetized in a standard fashion   Anesthetic:  1% lidocaine w/ epinephrine 1-100,000 buffered w/ 8.4% NaHCO3 Curettage performed in three different directions: Yes   Electrodesiccation performed over the curetted area: Yes  Lesion length (cm):  1.2 Lesion width (cm):  1.2 Margin per side (cm):  0.2 Final wound size (cm):  1.6 Hemostasis achieved with:  pressure, aluminum chloride and electrodesiccation Outcome: patient tolerated procedure well with no complications   Post-procedure details: sterile dressing applied and wound care instructions given   Dressing type: bandage and petrolatum    Specimen 1 -  Surgical pathology Differential Diagnosis: D48.5 r/o SCC vs other  ED&C today  Check Margins: No  Lentigines - Scattered tan macules - Due to sun exposure - Benign-appearing, observe - Recommend daily broad spectrum sunscreen SPF 30+ to sun-exposed areas, reapply every 2 hours as needed. - Call for any changes  Seborrheic Keratoses - Stuck-on, waxy, tan-brown papules and/or plaques  - Benign-appearing - Discussed benign etiology and prognosis. - Observe - Call for any changes  Melanocytic Nevi - Tan-brown and/or pink-flesh-colored symmetric macules and papules - Benign appearing on exam today - Observation - Call clinic for new or changing moles - Recommend daily use of broad spectrum spf 30+ sunscreen to sun-exposed areas.   Hemangiomas - Red papules - Discussed benign nature - Observe - Call for any changes  Actinic Damage - Chronic condition, secondary to cumulative UV/sun exposure - diffuse scaly erythematous macules with underlying dyspigmentation - Recommend daily broad spectrum sunscreen SPF 30+ to sun-exposed areas, reapply every 2 hours as needed.  - Staying in the shade or wearing long sleeves, sun glasses (UVA+UVB protection) and wide brim hats (4-inch brim around the entire circumference of the hat) are also recommended for sun protection.  - Call for new or changing lesions.  Purpura - Chronic; persistent and recurrent.  Treatable, but not curable. - Violaceous macules and patches - Benign - Related to trauma, age, sun damage and/or use of blood thinners, chronic use of topical and/or oral steroids - Observe - Can use OTC arnica containing moisturizer such as Dermend Bruise Formula if desired - Call for worsening or other concerns  Skin cancer screening performed today.  Return in about 6 months (around 07/26/2022).  Luther Redo, CMA, am acting as scribe for Sarina Ser, MD . Documentation: I have reviewed the above documentation for accuracy and  completeness, and I agree with the above.  Sarina Ser, MD

## 2022-01-27 ENCOUNTER — Encounter: Payer: Self-pay | Admitting: Dermatology

## 2022-01-28 ENCOUNTER — Telehealth: Payer: Self-pay

## 2022-01-28 NOTE — Telephone Encounter (Signed)
Patient informed of pathology results 

## 2022-01-28 NOTE — Telephone Encounter (Signed)
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

## 2022-02-02 ENCOUNTER — Ambulatory Visit (INDEPENDENT_AMBULATORY_CARE_PROVIDER_SITE_OTHER): Payer: PPO | Admitting: Dermatology

## 2022-02-02 ENCOUNTER — Other Ambulatory Visit: Payer: Self-pay

## 2022-02-02 DIAGNOSIS — L03114 Cellulitis of left upper limb: Secondary | ICD-10-CM

## 2022-02-02 MED ORDER — MUPIROCIN 2 % EX OINT: TOPICAL_OINTMENT | CUTANEOUS | 0 refills | Status: DC

## 2022-02-02 MED ORDER — DOXYCYCLINE HYCLATE 100 MG PO TABS: ORAL_TABLET | ORAL | 0 refills | Status: DC

## 2022-02-02 NOTE — Patient Instructions (Signed)

## 2022-02-02 NOTE — Progress Notes (Signed)
° °  Follow-Up Visit   Subjective  Nathaniel Ibarra is a 81 y.o. male who presents for the following: Possible infection (At [facility] site on the L forearm - patient c/o surrounding erythema and is concerned that the site is infected. ). The following portions of the chart were reviewed this encounter and updated as appropriate:   Tobacco   Allergies   Meds   Problems   Med Hx   Surg Hx   Fam Hx      Review of Systems:  No other skin or systemic complaints except as noted in HPI or Assessment and Plan.  Objective  Well appearing patient in no apparent distress; mood and affect are within normal limits.  A focused examination was performed including the L forearm . Relevant physical exam findings are noted in the Assessment and Plan.  L forearm Healing ulceration with surrounding erythema of the L forearm at [facility] site.    Assessment & Plan  Cellulitis of left upper extremity L forearm  Start Doxycycline 100mg  po BID x 10 days. Take with food. Doxycycline should be taken with food to prevent nausea. Do not lay down for 30 minutes after taking. Be cautious with sun exposure and use good sun protection while on this medication. Pregnant women should not take this medication.   Start Mupirocin 2% ointment to aa's QD until healed.   doxycycline (VIBRA-TABS) 100 MG tablet - L forearm Take one tab po BID with food x 10 days.  mupirocin ointment (BACTROBAN) 2 % - L forearm Apply to wounds QD until healed.   Return for appointment as scheduled.  Luther Redo, CMA, am acting as scribe for Sarina Ser, MD . Documentation: I have reviewed the above documentation for accuracy and completeness, and I agree with the above.  Sarina Ser, MD

## 2022-02-03 DIAGNOSIS — S61011A Laceration without foreign body of right thumb without damage to nail, initial encounter: Secondary | ICD-10-CM | POA: Diagnosis not present

## 2022-02-03 DIAGNOSIS — S7002XA Contusion of left hip, initial encounter: Secondary | ICD-10-CM | POA: Diagnosis not present

## 2022-02-03 DIAGNOSIS — Z79890 Hormone replacement therapy (postmenopausal): Secondary | ICD-10-CM | POA: Diagnosis not present

## 2022-02-03 DIAGNOSIS — E119 Type 2 diabetes mellitus without complications: Secondary | ICD-10-CM | POA: Diagnosis not present

## 2022-02-03 DIAGNOSIS — Z7901 Long term (current) use of anticoagulants: Secondary | ICD-10-CM | POA: Diagnosis not present

## 2022-02-03 DIAGNOSIS — S79912A Unspecified injury of left hip, initial encounter: Secondary | ICD-10-CM | POA: Diagnosis not present

## 2022-02-03 DIAGNOSIS — M7989 Other specified soft tissue disorders: Secondary | ICD-10-CM | POA: Diagnosis not present

## 2022-02-03 DIAGNOSIS — E039 Hypothyroidism, unspecified: Secondary | ICD-10-CM | POA: Diagnosis not present

## 2022-02-03 DIAGNOSIS — S51812A Laceration without foreign body of left forearm, initial encounter: Secondary | ICD-10-CM | POA: Diagnosis not present

## 2022-02-03 DIAGNOSIS — I10 Essential (primary) hypertension: Secondary | ICD-10-CM | POA: Diagnosis not present

## 2022-02-03 DIAGNOSIS — Z79899 Other long term (current) drug therapy: Secondary | ICD-10-CM | POA: Diagnosis not present

## 2022-02-03 DIAGNOSIS — S41112A Laceration without foreign body of left upper arm, initial encounter: Secondary | ICD-10-CM | POA: Diagnosis not present

## 2022-02-03 DIAGNOSIS — S71012A Laceration without foreign body, left hip, initial encounter: Secondary | ICD-10-CM | POA: Diagnosis not present

## 2022-02-05 ENCOUNTER — Encounter: Payer: Self-pay | Admitting: Dermatology

## 2022-02-08 DIAGNOSIS — F321 Major depressive disorder, single episode, moderate: Secondary | ICD-10-CM | POA: Diagnosis not present

## 2022-02-08 DIAGNOSIS — S7012XD Contusion of left thigh, subsequent encounter: Secondary | ICD-10-CM | POA: Diagnosis not present

## 2022-02-08 DIAGNOSIS — R7303 Prediabetes: Secondary | ICD-10-CM | POA: Diagnosis not present

## 2022-02-08 DIAGNOSIS — N400 Enlarged prostate without lower urinary tract symptoms: Secondary | ICD-10-CM | POA: Diagnosis not present

## 2022-02-08 DIAGNOSIS — I2510 Atherosclerotic heart disease of native coronary artery without angina pectoris: Secondary | ICD-10-CM | POA: Diagnosis not present

## 2022-02-08 DIAGNOSIS — I4891 Unspecified atrial fibrillation: Secondary | ICD-10-CM | POA: Diagnosis not present

## 2022-02-08 DIAGNOSIS — E039 Hypothyroidism, unspecified: Secondary | ICD-10-CM | POA: Diagnosis not present

## 2022-02-08 DIAGNOSIS — G4700 Insomnia, unspecified: Secondary | ICD-10-CM | POA: Diagnosis not present

## 2022-02-08 DIAGNOSIS — M13862 Other specified arthritis, left knee: Secondary | ICD-10-CM | POA: Diagnosis not present

## 2022-02-21 DIAGNOSIS — I2510 Atherosclerotic heart disease of native coronary artery without angina pectoris: Secondary | ICD-10-CM | POA: Diagnosis not present

## 2022-02-21 DIAGNOSIS — E039 Hypothyroidism, unspecified: Secondary | ICD-10-CM | POA: Diagnosis not present

## 2022-02-21 DIAGNOSIS — R7303 Prediabetes: Secondary | ICD-10-CM | POA: Diagnosis not present

## 2022-02-22 DIAGNOSIS — I447 Left bundle-branch block, unspecified: Secondary | ICD-10-CM | POA: Diagnosis not present

## 2022-02-22 DIAGNOSIS — I5022 Chronic systolic (congestive) heart failure: Secondary | ICD-10-CM | POA: Diagnosis not present

## 2022-02-22 DIAGNOSIS — F321 Major depressive disorder, single episode, moderate: Secondary | ICD-10-CM | POA: Diagnosis not present

## 2022-02-22 DIAGNOSIS — I4891 Unspecified atrial fibrillation: Secondary | ICD-10-CM | POA: Diagnosis not present

## 2022-02-22 DIAGNOSIS — I2510 Atherosclerotic heart disease of native coronary artery without angina pectoris: Secondary | ICD-10-CM | POA: Diagnosis not present

## 2022-02-22 DIAGNOSIS — Z0001 Encounter for general adult medical examination with abnormal findings: Secondary | ICD-10-CM | POA: Diagnosis not present

## 2022-02-22 DIAGNOSIS — R7303 Prediabetes: Secondary | ICD-10-CM | POA: Diagnosis not present

## 2022-02-22 DIAGNOSIS — I5043 Acute on chronic combined systolic (congestive) and diastolic (congestive) heart failure: Secondary | ICD-10-CM | POA: Diagnosis not present

## 2022-02-22 DIAGNOSIS — M13862 Other specified arthritis, left knee: Secondary | ICD-10-CM | POA: Diagnosis not present

## 2022-02-22 DIAGNOSIS — I480 Paroxysmal atrial fibrillation: Secondary | ICD-10-CM | POA: Diagnosis not present

## 2022-02-22 DIAGNOSIS — Z1331 Encounter for screening for depression: Secondary | ICD-10-CM | POA: Diagnosis not present

## 2022-02-22 DIAGNOSIS — D638 Anemia in other chronic diseases classified elsewhere: Secondary | ICD-10-CM | POA: Diagnosis not present

## 2022-02-22 DIAGNOSIS — I10 Essential (primary) hypertension: Secondary | ICD-10-CM | POA: Diagnosis not present

## 2022-02-22 DIAGNOSIS — N400 Enlarged prostate without lower urinary tract symptoms: Secondary | ICD-10-CM | POA: Diagnosis not present

## 2022-02-22 DIAGNOSIS — R609 Edema, unspecified: Secondary | ICD-10-CM | POA: Diagnosis not present

## 2022-02-22 DIAGNOSIS — E039 Hypothyroidism, unspecified: Secondary | ICD-10-CM | POA: Diagnosis not present

## 2022-02-22 DIAGNOSIS — R001 Bradycardia, unspecified: Secondary | ICD-10-CM | POA: Diagnosis not present

## 2022-02-22 DIAGNOSIS — G4700 Insomnia, unspecified: Secondary | ICD-10-CM | POA: Diagnosis not present

## 2022-03-01 DIAGNOSIS — M1712 Unilateral primary osteoarthritis, left knee: Secondary | ICD-10-CM | POA: Diagnosis not present

## 2022-03-02 DIAGNOSIS — I5022 Chronic systolic (congestive) heart failure: Secondary | ICD-10-CM | POA: Diagnosis not present

## 2022-03-07 DIAGNOSIS — M1712 Unilateral primary osteoarthritis, left knee: Secondary | ICD-10-CM | POA: Insufficient documentation

## 2022-03-08 DIAGNOSIS — M1712 Unilateral primary osteoarthritis, left knee: Secondary | ICD-10-CM | POA: Diagnosis not present

## 2022-03-29 DIAGNOSIS — R600 Localized edema: Secondary | ICD-10-CM | POA: Diagnosis not present

## 2022-03-29 DIAGNOSIS — R001 Bradycardia, unspecified: Secondary | ICD-10-CM | POA: Diagnosis not present

## 2022-03-29 DIAGNOSIS — I480 Paroxysmal atrial fibrillation: Secondary | ICD-10-CM | POA: Diagnosis not present

## 2022-03-29 DIAGNOSIS — I10 Essential (primary) hypertension: Secondary | ICD-10-CM | POA: Diagnosis not present

## 2022-03-29 DIAGNOSIS — I5022 Chronic systolic (congestive) heart failure: Secondary | ICD-10-CM | POA: Diagnosis not present

## 2022-03-29 DIAGNOSIS — I2510 Atherosclerotic heart disease of native coronary artery without angina pectoris: Secondary | ICD-10-CM | POA: Diagnosis not present

## 2022-03-29 DIAGNOSIS — I447 Left bundle-branch block, unspecified: Secondary | ICD-10-CM | POA: Diagnosis not present

## 2022-03-31 ENCOUNTER — Other Ambulatory Visit: Payer: Self-pay | Admitting: Cardiology

## 2022-03-31 ENCOUNTER — Other Ambulatory Visit (HOSPITAL_COMMUNITY): Payer: Self-pay | Admitting: Cardiology

## 2022-03-31 DIAGNOSIS — I4891 Unspecified atrial fibrillation: Secondary | ICD-10-CM

## 2022-03-31 DIAGNOSIS — R600 Localized edema: Secondary | ICD-10-CM

## 2022-03-31 DIAGNOSIS — R1900 Intra-abdominal and pelvic swelling, mass and lump, unspecified site: Secondary | ICD-10-CM

## 2022-04-05 ENCOUNTER — Ambulatory Visit
Admission: RE | Admit: 2022-04-05 | Discharge: 2022-04-05 | Disposition: A | Discharge status: Home / Self Care | Payer: PPO | Source: Ambulatory Visit | Attending: Cardiology | Admitting: Cardiology

## 2022-04-05 DIAGNOSIS — R600 Localized edema: Secondary | ICD-10-CM | POA: Diagnosis not present

## 2022-04-05 DIAGNOSIS — N1330 Unspecified hydronephrosis: Secondary | ICD-10-CM | POA: Diagnosis not present

## 2022-04-05 DIAGNOSIS — R140 Abdominal distension (gaseous): Secondary | ICD-10-CM | POA: Diagnosis not present

## 2022-04-05 DIAGNOSIS — R1900 Intra-abdominal and pelvic swelling, mass and lump, unspecified site: Secondary | ICD-10-CM | POA: Diagnosis not present

## 2022-04-05 DIAGNOSIS — I4891 Unspecified atrial fibrillation: Secondary | ICD-10-CM | POA: Diagnosis not present

## 2022-04-07 ENCOUNTER — Encounter: Payer: Self-pay | Admitting: Podiatry

## 2022-04-07 ENCOUNTER — Ambulatory Visit: Payer: PPO | Admitting: Podiatry

## 2022-04-07 DIAGNOSIS — B351 Tinea unguium: Secondary | ICD-10-CM | POA: Diagnosis not present

## 2022-04-07 DIAGNOSIS — M79674 Pain in right toe(s): Secondary | ICD-10-CM | POA: Diagnosis not present

## 2022-04-07 DIAGNOSIS — D689 Coagulation defect, unspecified: Secondary | ICD-10-CM | POA: Diagnosis not present

## 2022-04-07 DIAGNOSIS — M79675 Pain in left toe(s): Secondary | ICD-10-CM

## 2022-04-07 NOTE — Progress Notes (Signed)
This patient returns to my office for at risk foot care.  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  This patient is unable to cut nails himself since the patient cannot reach his nails.These nails are painful walking and wearing shoes.  This patient presents for at risk foot care today.  General Appearance  Alert, conversant and in no acute stress.  Vascular  Dorsalis pedis and posterior tibial  pulses are palpable  bilaterally.  Capillary return is within normal limits  bilaterally. Temperature is within normal limits  bilaterally.  Neurologic  Senn-Weinstein monofilament wire test within normal limits  bilaterally. Muscle power within normal limits bilaterally.  Nails Thick disfigured discolored nails with subungual debris  hallux nails  bilaterally. No evidence of bacterial infection or drainage bilaterally.  Orthopedic  No limitations of motion  feet .  No crepitus or effusions noted.  No bony pathology or digital deformities noted.  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  Skin  normotropic skin with no porokeratosis noted bilaterally.  No signs of infections or ulcers noted.     Onychomycosis  Pain in right toes  Pain in left toes  Consent was obtained for treatment procedures.   Mechanical debridement of nails 1-5  bilaterally performed with a nail nipper.  Filed with dremel without incident.    Return office visit   3 months                  Told patient to return for periodic foot care and evaluation due to potential at risk complications.   Sahaana Weitman DPM   

## 2022-04-11 ENCOUNTER — Other Ambulatory Visit
Admission: RE | Admit: 2022-04-11 | Discharge: 2022-04-11 | Disposition: A | Discharge status: Home / Self Care | Payer: PPO | Source: Ambulatory Visit | Attending: Student | Admitting: Student

## 2022-04-11 DIAGNOSIS — J22 Unspecified acute lower respiratory infection: Secondary | ICD-10-CM | POA: Diagnosis not present

## 2022-04-11 DIAGNOSIS — Z03818 Encounter for observation for suspected exposure to other biological agents ruled out: Secondary | ICD-10-CM | POA: Insufficient documentation

## 2022-04-11 DIAGNOSIS — R600 Localized edema: Secondary | ICD-10-CM | POA: Diagnosis not present

## 2022-04-11 DIAGNOSIS — R051 Acute cough: Secondary | ICD-10-CM | POA: Diagnosis not present

## 2022-04-11 DIAGNOSIS — R0989 Other specified symptoms and signs involving the circulatory and respiratory systems: Secondary | ICD-10-CM | POA: Diagnosis not present

## 2022-04-11 DIAGNOSIS — I5022 Chronic systolic (congestive) heart failure: Secondary | ICD-10-CM | POA: Insufficient documentation

## 2022-04-11 DIAGNOSIS — R059 Cough, unspecified: Secondary | ICD-10-CM | POA: Diagnosis not present

## 2022-04-11 LAB — BRAIN NATRIURETIC PEPTIDE: B Natriuretic Peptide: 410.5 pg/mL — ABNORMAL HIGH (ref 0.0–100.0)

## 2022-05-18 ENCOUNTER — Encounter: Payer: Self-pay | Admitting: Dermatology

## 2022-05-18 ENCOUNTER — Ambulatory Visit: Payer: PPO | Admitting: Dermatology

## 2022-05-18 DIAGNOSIS — C44310 Basal cell carcinoma of skin of unspecified parts of face: Secondary | ICD-10-CM

## 2022-05-18 DIAGNOSIS — C4491 Basal cell carcinoma of skin, unspecified: Secondary | ICD-10-CM

## 2022-05-18 DIAGNOSIS — L578 Other skin changes due to chronic exposure to nonionizing radiation: Secondary | ICD-10-CM

## 2022-05-18 DIAGNOSIS — C44319 Basal cell carcinoma of skin of other parts of face: Secondary | ICD-10-CM | POA: Diagnosis not present

## 2022-05-18 DIAGNOSIS — D485 Neoplasm of uncertain behavior of skin: Secondary | ICD-10-CM

## 2022-05-18 HISTORY — DX: Basal cell carcinoma of skin, unspecified: C44.91

## 2022-05-18 NOTE — Patient Instructions (Signed)
Electrodesiccation and Curettage ("Scrape and Burn") Wound Care Instructions  Leave the original bandage on for 24 hours if possible.  If the bandage becomes soaked or soiled before that time, it is OK to remove it and examine the wound.  A small amount of post-operative bleeding is normal.  If excessive bleeding occurs, remove the bandage, place gauze over the site and apply continuous pressure (no peeking) over the area for 30 minutes. If this does not work, please call our clinic as soon as possible or page your doctor if it is after hours.   Once a day, cleanse the wound with soap and water. It is fine to shower. If a thick crust develops you may use a Q-tip dipped into dilute hydrogen peroxide (mix 1:1 with water) to dissolve it.  Hydrogen peroxide can slow the healing process, so use it only as needed.    After washing, apply petroleum jelly (Vaseline) or an antibiotic ointment if your doctor prescribed one for you, followed by a bandage.    For best healing, the wound should be covered with a layer of ointment at all times. If you are not able to keep the area covered with a bandage to hold the ointment in place, this may mean re-applying the ointment several times a day.  Continue this wound care until the wound has healed and is no longer open. It may take several weeks for the wound to heal and close.  Itching and mild discomfort is normal during the healing process.  If you have any discomfort, you can take Tylenol (acetaminophen) or ibuprofen as directed on the bottle. (Please do not take these if you have an allergy to them or cannot take them for another reason).  Some redness, tenderness and white or yellow material in the wound is normal healing.  If the area becomes very sore and red, or develops a thick yellow-green material (pus), it may be infected; please notify us.    Wound healing continues for up to one year following surgery. It is not unusual to experience pain in the scar  from time to time during the interval.  If the pain becomes severe or the scar thickens, you should notify the office.    A slight amount of redness in a scar is expected for the first six months.  After six months, the redness will fade and the scar will soften and fade.  The color difference becomes less noticeable with time.  If there are any problems, return for a post-op surgery check at your earliest convenience.  To improve the appearance of the scar, you can use silicone scar gel, cream, or sheets (such as Mederma or Serica) every night for up to one year. These are available over the counter (without a prescription).  Please call our office at [phone] for any questions or concerns.    If You Need Anything After Your Visit  If you have any questions or concerns for your doctor, please call our main line at [phone] and press option 4 to reach your doctor's medical assistant. If no one answers, please leave a voicemail as directed and we will return your call as soon as possible. Messages left after 4 pm will be answered the following business day.   You may also send [place] a message via [place]. We typically respond to MyChart messages within 1-2 business days.  For prescription refills, please ask your pharmacy to contact our office. Our fax number is [phone].  If you  have an urgent issue when the clinic is closed that cannot wait until the next business day, you can page your doctor at the number below.    Please note that while we do our best to be available for urgent issues outside of office hours, we are not available 24/7.   If you have an urgent issue and are unable to reach [place], you may choose to seek medical care at your doctor's office, retail clinic, urgent care center, or emergency room.  If you have a medical emergency, please immediately call 911 or go to the emergency department.  Pager Numbers  - Dr. Nehemiah Massed: [phone]  - Dr. Laurence Ferrari:  [phone]  - Dr. Nicole Kindred: [phone]  In the event of inclement weather, please call our main line at [phone] for an update on the status of any delays or closures.  Dermatology Medication Tips: Please keep the boxes that topical medications come in in order to help keep track of the instructions about where and how to use these. Pharmacies typically print the medication instructions only on the boxes and not directly on the medication tubes.   If your medication is too expensive, please contact our office at [phone] option 4 or send [place] a message through [place].   We are unable to tell what your co-pay for medications will be in advance as this is different depending on your insurance coverage. However, we may be able to find a substitute medication at lower cost or fill out paperwork to get insurance to cover a needed medication.   If a prior authorization is required to get your medication covered by your insurance company, please allow [place] 1-2 business days to complete this process.  Drug prices often vary depending on where the prescription is filled and some pharmacies may offer cheaper prices.  The website www.goodrx.com contains coupons for medications through different pharmacies. The prices here do not account for what the cost may be with help from insurance (it may be cheaper with your insurance), but the website can give you the price if you did not use any insurance.  - You can print the associated coupon and take it with your prescription to the pharmacy.  - You may also stop by our office during regular business hours and pick up a GoodRx coupon card.  - If you need your prescription sent electronically to a different pharmacy, notify our office through [facility] or by phone at [phone] option 4.     Si Usted Necesita Algo Despus de Su Visita  Tambin puede enviarnos un mensaje a travs de Pharmacist, community. Por lo general respondemos a los mensajes de  MyChart en el transcurso de 1 a 2 das hbiles.  Para renovar recetas, por favor pida a su farmacia que se ponga en contacto con nuestra oficina. Harland Dingwall de fax es [place] [phone].  Si tiene un asunto urgente cuando la clnica est cerrada y que no puede esperar hasta el siguiente da hbil, puede llamar/localizar a su doctor(a) al nmero que aparece a continuacin.   Por favor, tenga en cuenta que aunque hacemos todo lo posible para estar disponibles para asuntos urgentes fuera del horario de [place], no estamos disponibles las 24 horas del da, los 7 das de la [place].   Si tiene un problema urgente y no puede comunicarse con nosotros, puede optar por buscar atencin mdica  en el consultorio de su doctor(a), en una clnica privada, en un centro de atencin urgente o en una sala  de emergencias.  Si tiene Engineering geologist, por favor llame inmediatamente al 911 o vaya a la sala de emergencias.  Nmeros de bper  - Dr. Nehemiah Massed: [phone]  - Dra. Moye: [phone]  - Dra. Nicole Kindred: [phone]  En caso de inclemencias del [place], por favor llame a Johnsie Kindred principal al [phone] para una actualizacin sobre el [place] de cualquier retraso o cierre.  Consejos para la medicacin en dermatologa: Por favor, guarde las cajas en las que vienen los medicamentos de uso tpico para ayudarle a seguir las instrucciones sobre dnde y cmo usarlos. Las farmacias generalmente imprimen las instrucciones del medicamento slo en las cajas y no directamente en los tubos del [place].   Si su medicamento es muy caro, por favor, pngase en contacto con Zigmund Daniel llamando al [phone] y presione la opcin 4 o envenos un mensaje a travs de Pharmacist, community.   No podemos decirle cul ser su copago por los medicamentos por adelantado ya que esto es diferente dependiendo de la cobertura de su seguro. Sin embargo, es posible que podamos encontrar un medicamento sustituto a Contractor un formulario para que el seguro cubra el medicamento que se considera necesario.   Si se requiere una autorizacin previa para que su compaa de seguros [place] su medicamento, por favor permtanos de 1 a 2 das hbiles para completar este proceso.  Los precios de los medicamentos varan con frecuencia dependiendo del Environmental consultant de dnde se surte la receta y alguna farmacias pueden ofrecer precios ms baratos.  El sitio web www.goodrx.com tiene cupones para medicamentos de Airline pilot. Los precios aqu no tienen en cuenta lo que podra costar con la ayuda del seguro (puede ser ms barato con su seguro), pero el sitio web puede darle el precio si no utiliz Research scientist (physical sciences).  - Puede imprimir el cupn correspondiente y llevarlo con su receta a la farmacia.  - Tambin puede pasar por nuestra oficina durante el horario de atencin regular y Charity fundraiser una tarjeta de cupones de GoodRx.  - Si necesita que su receta se enve electrnicamente a una farmacia diferente, informe a nuestra oficina a travs de MyChart de [place] o por telfono llamando al [phone] y presione la opcin 4.

## 2022-05-18 NOTE — Progress Notes (Signed)
   Follow-Up Visit   Subjective  Nathaniel Ibarra is a 81 y.o. male who presents for the following: lesion (Dur: several months. Hits when shaving, bleeds at times. Red, crusted). The patient has spots, moles and lesions to be evaluated, some may be new or changing and the patient has concerns that these could be cancer.  The following portions of the chart were reviewed this encounter and updated as appropriate:  Tobacco  Allergies  Meds  Problems  Med Hx  Surg Hx  Fam Hx     Review of Systems: No other skin or systemic complaints except as noted in HPI or Assessment and Plan.  Objective  Well appearing patient in no apparent distress; mood and affect are within normal limits.  A focused examination was performed including face. Relevant physical exam findings are noted in the Assessment and Plan.  Left Inferior Cheek 1.1 cm crusted ulcer      Assessment & Plan  Neoplasm of uncertain behavior of skin Left Inferior Cheek  Skin excision  Lesion length (cm):  1.1 Lesion width (cm):  1.1 Margin per side (cm):  0.2 Total excision diameter (cm):  1.5 Informed consent: discussed and consent obtained   Timeout: patient name, date of birth, surgical site, and procedure verified   Anesthesia: the lesion was anesthetized in a standard fashion   Anesthetic:  1% lidocaine w/ epinephrine 1-100,000 buffered w/ 8.4% NaHCO3 Instrument used: #15 blade   Hemostasis achieved with: pressure, aluminum chloride and electrodesiccation   Hemostasis achieved with comment:  Electrocautery Outcome: patient tolerated procedure well with no complications   Post-procedure details: sterile dressing applied and wound care instructions given   Dressing type: bandage and pressure dressing (mupirocin)   Additional details:  Simple excision performed today.   Specimen 1 - Surgical pathology Differential Diagnosis: BCC vs SCC Check Margins: No  Actinic Damage - chronic, secondary to cumulative  UV radiation exposure/sun exposure over time - diffuse scaly erythematous macules with underlying dyspigmentation - Recommend daily broad spectrum sunscreen SPF 30+ to sun-exposed areas, reapply every 2 hours as needed.  - Recommend staying in the shade or wearing long sleeves, sun glasses (UVA+UVB protection) and wide brim hats (4-inch brim around the entire circumference of the hat). - Call for new or changing lesions.  Return in about 3 months (around 08/18/2022) for Biopsy Follow Up.  I, Emelia Salisbury, CMA, am acting as scribe for Sarina Ser, MD. Documentation: I have reviewed the above documentation for accuracy and completeness, and I agree with the above.  Sarina Ser, MD

## 2022-05-20 DIAGNOSIS — R350 Frequency of micturition: Secondary | ICD-10-CM | POA: Diagnosis not present

## 2022-05-20 DIAGNOSIS — N3941 Urge incontinence: Secondary | ICD-10-CM | POA: Diagnosis not present

## 2022-05-20 DIAGNOSIS — N401 Enlarged prostate with lower urinary tract symptoms: Secondary | ICD-10-CM | POA: Diagnosis not present

## 2022-05-20 DIAGNOSIS — N130 Hydronephrosis with ureteropelvic junction obstruction: Secondary | ICD-10-CM | POA: Diagnosis not present

## 2022-05-22 ENCOUNTER — Encounter: Payer: Self-pay | Admitting: Dermatology

## 2022-05-23 ENCOUNTER — Telehealth: Payer: Self-pay

## 2022-05-23 NOTE — Telephone Encounter (Signed)
Advised patient and his wife of results/hd

## 2022-05-23 NOTE — Telephone Encounter (Signed)
-----   Message from Ralene Bathe, MD sent at 05/19/2022  6:06 PM EDT ----- Diagnosis Skin , left inferior cheek BASAL CELL CARCINOMA, NODULAR AND INFILTRATIVE PATTERNS, CLOSE TO MARGIN  Cancer - BCC Already treated - excised - clear margins Recheck next visit

## 2022-05-25 DIAGNOSIS — M545 Low back pain, unspecified: Secondary | ICD-10-CM | POA: Diagnosis not present

## 2022-05-25 DIAGNOSIS — M5136 Other intervertebral disc degeneration, lumbar region: Secondary | ICD-10-CM | POA: Diagnosis not present

## 2022-05-25 DIAGNOSIS — R109 Unspecified abdominal pain: Secondary | ICD-10-CM | POA: Diagnosis not present

## 2022-05-25 DIAGNOSIS — R1032 Left lower quadrant pain: Secondary | ICD-10-CM | POA: Diagnosis not present

## 2022-05-31 DIAGNOSIS — K8020 Calculus of gallbladder without cholecystitis without obstruction: Secondary | ICD-10-CM | POA: Diagnosis not present

## 2022-05-31 DIAGNOSIS — K5730 Diverticulosis of large intestine without perforation or abscess without bleeding: Secondary | ICD-10-CM | POA: Diagnosis not present

## 2022-05-31 DIAGNOSIS — N1339 Other hydronephrosis: Secondary | ICD-10-CM | POA: Diagnosis not present

## 2022-05-31 DIAGNOSIS — N1330 Unspecified hydronephrosis: Secondary | ICD-10-CM | POA: Diagnosis not present

## 2022-06-22 ENCOUNTER — Emergency Department: Payer: PPO

## 2022-06-22 ENCOUNTER — Emergency Department
Admission: EM | Admit: 2022-06-22 | Discharge: 2022-06-22 | Disposition: A | Discharge status: Home / Self Care | Payer: PPO | Attending: Emergency Medicine | Admitting: Emergency Medicine

## 2022-06-22 ENCOUNTER — Other Ambulatory Visit: Payer: Self-pay

## 2022-06-22 DIAGNOSIS — W01198A Fall on same level from slipping, tripping and stumbling with subsequent striking against other object, initial encounter: Secondary | ICD-10-CM | POA: Diagnosis not present

## 2022-06-22 DIAGNOSIS — Z7901 Long term (current) use of anticoagulants: Secondary | ICD-10-CM | POA: Insufficient documentation

## 2022-06-22 DIAGNOSIS — I110 Hypertensive heart disease with heart failure: Secondary | ICD-10-CM | POA: Insufficient documentation

## 2022-06-22 DIAGNOSIS — S0101XA Laceration without foreign body of scalp, initial encounter: Secondary | ICD-10-CM | POA: Diagnosis not present

## 2022-06-22 DIAGNOSIS — S0990XA Unspecified injury of head, initial encounter: Secondary | ICD-10-CM

## 2022-06-22 DIAGNOSIS — I6529 Occlusion and stenosis of unspecified carotid artery: Secondary | ICD-10-CM | POA: Diagnosis not present

## 2022-06-22 DIAGNOSIS — S0181XA Laceration without foreign body of other part of head, initial encounter: Secondary | ICD-10-CM | POA: Diagnosis not present

## 2022-06-22 DIAGNOSIS — Z23 Encounter for immunization: Secondary | ICD-10-CM | POA: Diagnosis not present

## 2022-06-22 DIAGNOSIS — S199XXA Unspecified injury of neck, initial encounter: Secondary | ICD-10-CM | POA: Diagnosis not present

## 2022-06-22 DIAGNOSIS — S01111A Laceration without foreign body of right eyelid and periocular area, initial encounter: Secondary | ICD-10-CM | POA: Insufficient documentation

## 2022-06-22 DIAGNOSIS — S51811A Laceration without foreign body of right forearm, initial encounter: Secondary | ICD-10-CM | POA: Diagnosis not present

## 2022-06-22 DIAGNOSIS — Z85828 Personal history of other malignant neoplasm of skin: Secondary | ICD-10-CM | POA: Insufficient documentation

## 2022-06-22 DIAGNOSIS — I509 Heart failure, unspecified: Secondary | ICD-10-CM | POA: Insufficient documentation

## 2022-06-22 DIAGNOSIS — M25562 Pain in left knee: Secondary | ICD-10-CM | POA: Diagnosis not present

## 2022-06-22 MED ORDER — LIDOCAINE HCL (PF) 1 % IJ SOLN
5.0000 mL | Freq: Once | INTRAMUSCULAR | Status: AC
  Administered 2022-06-22: 5 mL via INTRADERMAL
  Filled 2022-06-22: qty 5

## 2022-06-22 MED ORDER — BACITRACIN ZINC 500 UNIT/GM EX OINT
TOPICAL_OINTMENT | Freq: Once | CUTANEOUS | Status: AC
  Administered 2022-06-22: 1 via TOPICAL
  Filled 2022-06-22: qty 0.9

## 2022-06-22 MED ORDER — TETANUS-DIPHTH-ACELL PERTUSSIS 5-2.5-18.5 LF-MCG/0.5 IM SUSY
0.5000 mL | PREFILLED_SYRINGE | Freq: Once | INTRAMUSCULAR | Status: AC
  Administered 2022-06-22: 0.5 mL via INTRAMUSCULAR
  Filled 2022-06-22: qty 0.5

## 2022-06-22 NOTE — ED Provider Triage Note (Signed)
Emergency Medicine Provider Triage Evaluation Note  Nathaniel Ibarra , a 81 y.o. male  was evaluated in triage.  Pt complains of fall. Stubbed his toe and hit his head. No LOC. No vomiting. Able to ambulate. On eliquis. Has headache, no neck pain  Review of Systems  Positive: Headache, laceration to head Negative: Vomiting, ataxia, vision changes  Physical Exam  BP (!) 149/56 (BP Location: Left Arm)   Pulse 60   Temp 97.9 F (36.6 C) (Oral)   Resp 18   Ht 6' (1.829 m)   Wt 84.8 kg   SpO2 93%   BMI 25.35 kg/m  Gen:   Awake, no distress   Resp:  Normal effort  MSK:   Moves extremities without difficulty  Other:  Laceration to head, skin tear to right hand wrapped up, left knee contusion  Medical Decision Making  Medically screening exam initiated at 11:20 AM.  Appropriate orders placed.  DORRIS VANGORDER was informed that the remainder of the evaluation will be completed by another provider, this initial triage assessment does not replace that evaluation, and the importance of remaining in the ED until their evaluation is complete.     Marquette Old, PA-C 06/22/22 1124

## 2022-06-22 NOTE — ED Notes (Signed)
Pt reports falling today and injuring his right hand. Pt also has a knot on his right forehead. Bleeding under control during assessment and bandaged.

## 2022-06-22 NOTE — ED Provider Notes (Signed)
[facility] Provider Note    Event Date/Time   First MD Initiated Contact with Patient 06/22/22 1200     (approximate)   History   Fall   HPI  Nathaniel Ibarra is a 81 y.o. male presents to the emergency department for treatment and evaluation after mechanical, nonsyncopal fall.  Patient said he stubbed his foot on something and fell forward and hit his face.  He has a laceration to his right forehead and skin tear to his right wrist and forearm.  He is on Eliquis.  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 --      Excl. in [place]? --     Most recent vital signs: Vitals:   06/22/22 1119  BP: (!) 149/56  Pulse: 60  Resp: 18  Temp: 97.9 F (36.6 C)  SpO2: 93%    General: Awake, no distress.  CV:  Good peripheral perfusion.  Resp:  Normal effort.  Abd:  No distention.  Other:  3.5cm laceration on right forehead and stellate laceration just above right eyebrow.    ED Results / Procedures / Treatments   Labs (all labs ordered are listed, but only abnormal results are displayed) Labs Reviewed - No data to  display   EKG  Not indicated   RADIOLOGY  CT head, cervical spine negative for acute concerns. Image of the left knee negative for acute concerns.  I have independently reviewed and interpreted imaging as well as reviewed report from radiology.  PROCEDURES:  Critical Care performed: No  ..Laceration Repair  Date/Time: 06/22/2022 3:16 PM  Performed by: Victorino Dike, FNP Authorized by: Victorino Dike, FNP   Consent:    Consent obtained:  Verbal   Consent given by:  Patient   Risks discussed:  Poor cosmetic result Anesthesia:    Anesthesia method:  Local infiltration   Local anesthetic:  Lidocaine 1% w/o epi Laceration details:    Location: forehead.   Length (cm):  5.5 Pre-procedure details:    Preparation:  Patient was prepped and draped in usual sterile fashion Exploration:    Wound exploration: entire depth of wound visualized   Treatment:    Area cleansed with:  Povidone-iodine and saline   Irrigation method:  Syringe   Undermining:  Minimal   Layers/structures repaired:  Deep subcutaneous Deep subcutaneous:    Suture size:  5-0   Suture material:  Monocryl   Suture technique:  Figure eight Skin repair:    Repair method:  Sutures   Suture size:  6-0   Suture material:  Prolene   Suture technique:  Simple interrupted   Number of sutures:  5 Approximation:    Approximation:  Close Repair type:    Repair type:  Intermediate Post-procedure details:    Dressing:  Antibiotic ointment and sterile dressing   Procedure completion:  Tolerated well, no immediate complications .Marland KitchenLaceration Repair  Date/Time: 06/22/2022 3:22 PM  Performed by: Victorino Dike, FNP Authorized by: Victorino Dike, FNP   Consent:    Consent obtained:  Verbal   Consent given by:  Patient   Risks discussed:  Poor cosmetic result Laceration details:    Location:  Face   Face location:  Forehead   Length (cm):  2 Treatment:    Area cleansed with:  Povidone-iodine and  saline   Irrigation method:  Syringe Skin repair:    Repair method:  Sutures   Suture size:  6-0   Suture material:  Prolene   Suture technique:  Simple interrupted   Number of sutures:  2 Approximation:    Approximation:  Close Repair type:    Repair type:  Intermediate Post-procedure details:    Dressing:  Antibiotic ointment and sterile dressing Wound repair  Date/Time: 06/22/2022 3:25 PM  Performed by: Victorino Dike, FNP Authorized by: Victorino Dike, FNP  Comments: Skin tear to the left wrist/forearm cleaned with Hibiclens and saline then covered with Xeroform, Telfa, and wrapped with Kling.      MEDICATIONS ORDERED IN ED:  Medications  Tdap (BOOSTRIX) injection 0.5 mL (0.5 mLs Intramuscular Given 06/22/22 1212)  lidocaine (PF) (XYLOCAINE) 1 % injection 5 mL (5 mLs Intradermal Given by Other 06/22/22 1315)  bacitracin ointment (1 Application Topical Given 06/22/22 1418)     IMPRESSION / MDM / ASSESSMENT AND PLAN / ED COURSE   I reviewed the triage vital signs and the nursing notes.  Differential diagnosis includes, but is not limited to: Concussion, subdural hematoma Minor head injury, laceration, skin tear, musculoskeletal injury.  Patient's presentation is most consistent with acute presentation with potential threat to life or bodily function.  81 year old male presenting to the emergency department after mechanical, nonsyncopal fall earlier today.  See HPI for further details.  CT head and cervical spine are negative for acute concerns.  Image of the left knee is negative for bony abnormalities.  Wounds cleaned and repaired as described above.  Patient encouraged to follow-up with his primary care provider or go to urgent care in about 5 days for suture removal.  He was encouraged to follow-up sooner if he has any concerns.  ER return precautions also discussed.      FINAL CLINICAL IMPRESSION(S) / ED DIAGNOSES   Final diagnoses:  Skin tear of forearm  without complication, right, initial encounter  Laceration of eyebrow and forehead, right, initial encounter  Minor head injury, initial encounter     Rx / DC Orders   ED Discharge Orders     None        Note:  This document was prepared using Dragon voice recognition software and may include unintentional dictation errors.   Victorino Dike, FNP 06/22/22 1528    Blake Divine, MD 06/23/22 1531

## 2022-06-22 NOTE — ED Triage Notes (Signed)
Pt here with a fall this morning. Pt has a laceration above his right eye, kin tear on his right forearm and a small lac on his left hand. Pt is on Eloquis. Pt denies LOC.

## 2022-06-22 NOTE — ED Notes (Signed)
Pt in bed, pt has lac to R forehead and skin tear to R forearm and L finger. Bleeding is stopped at this time, pt awake and oriented, pt reports some L knee tenderness.  Resps even and unlabored, pupils are 53m and reactive to light.

## 2022-06-22 NOTE — Discharge Instructions (Addendum)
Do not get the sutured area wet for 24 hours. After 24 hours, shower/bathe as usual and pat the area dry. Change the bandage 2 times per day and apply antibiotic ointment. Leave open to air when at no risk of getting the area dirty, but cover at night before bed. See your PCP or go to Urgent Care in 5 days for suture removal or sooner for signs or concern of infection.  Take tylenol if needed for pain.

## 2022-06-27 DIAGNOSIS — Z5189 Encounter for other specified aftercare: Secondary | ICD-10-CM | POA: Diagnosis not present

## 2022-06-27 DIAGNOSIS — Z4802 Encounter for removal of sutures: Secondary | ICD-10-CM | POA: Diagnosis not present

## 2022-06-27 DIAGNOSIS — S0181XS Laceration without foreign body of other part of head, sequela: Secondary | ICD-10-CM | POA: Diagnosis not present

## 2022-06-27 DIAGNOSIS — S51811A Laceration without foreign body of right forearm, initial encounter: Secondary | ICD-10-CM | POA: Diagnosis not present

## 2022-06-28 DIAGNOSIS — I2510 Atherosclerotic heart disease of native coronary artery without angina pectoris: Secondary | ICD-10-CM | POA: Diagnosis not present

## 2022-06-28 DIAGNOSIS — I447 Left bundle-branch block, unspecified: Secondary | ICD-10-CM | POA: Diagnosis not present

## 2022-06-28 DIAGNOSIS — R001 Bradycardia, unspecified: Secondary | ICD-10-CM | POA: Diagnosis not present

## 2022-06-28 DIAGNOSIS — I10 Essential (primary) hypertension: Secondary | ICD-10-CM | POA: Diagnosis not present

## 2022-06-28 DIAGNOSIS — I5022 Chronic systolic (congestive) heart failure: Secondary | ICD-10-CM | POA: Diagnosis not present

## 2022-06-28 DIAGNOSIS — I480 Paroxysmal atrial fibrillation: Secondary | ICD-10-CM | POA: Diagnosis not present

## 2022-06-30 DIAGNOSIS — S61401D Unspecified open wound of right hand, subsequent encounter: Secondary | ICD-10-CM | POA: Diagnosis not present

## 2022-06-30 DIAGNOSIS — L089 Local infection of the skin and subcutaneous tissue, unspecified: Secondary | ICD-10-CM | POA: Diagnosis not present

## 2022-06-30 DIAGNOSIS — Z5189 Encounter for other specified aftercare: Secondary | ICD-10-CM | POA: Diagnosis not present

## 2022-06-30 DIAGNOSIS — S61511D Laceration without foreign body of right wrist, subsequent encounter: Secondary | ICD-10-CM | POA: Diagnosis not present

## 2022-07-07 ENCOUNTER — Ambulatory Visit: Payer: PPO | Admitting: Podiatry

## 2022-07-07 ENCOUNTER — Encounter: Payer: Self-pay | Admitting: Podiatry

## 2022-07-07 DIAGNOSIS — B351 Tinea unguium: Secondary | ICD-10-CM | POA: Diagnosis not present

## 2022-07-07 DIAGNOSIS — M79674 Pain in right toe(s): Secondary | ICD-10-CM | POA: Diagnosis not present

## 2022-07-07 DIAGNOSIS — M79675 Pain in left toe(s): Secondary | ICD-10-CM | POA: Diagnosis not present

## 2022-07-07 DIAGNOSIS — D689 Coagulation defect, unspecified: Secondary | ICD-10-CM | POA: Diagnosis not present

## 2022-07-07 NOTE — Progress Notes (Signed)
This patient returns to my office for at risk foot care.  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  This patient is unable to cut nails himself since the patient cannot reach his nails.These nails are painful walking and wearing shoes.  This patient presents for at risk foot care today.  General Appearance  Alert, conversant and in no acute stress.  Vascular  Dorsalis pedis and posterior tibial  pulses are palpable  bilaterally.  Capillary return is within normal limits  bilaterally. Temperature is within normal limits  bilaterally.  Neurologic  Senn-Weinstein monofilament wire test within normal limits  bilaterally. Muscle power within normal limits bilaterally.  Nails Thick disfigured discolored nails with subungual debris  hallux nails  bilaterally. No evidence of bacterial infection or drainage bilaterally.  Orthopedic  No limitations of motion  feet .  No crepitus or effusions noted.  No bony pathology or digital deformities noted.  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  Skin  normotropic skin with no porokeratosis noted bilaterally.  No signs of infections or ulcers noted.     Onychomycosis  Pain in right toes  Pain in left toes  Consent was obtained for treatment procedures.   Mechanical debridement of nails 1-5  bilaterally performed with a nail nipper.  Filed with dremel without incident.    Return office visit   3 months                  Told patient to return for periodic foot care and evaluation due to potential at risk complications.   Arron Tetrault DPM   

## 2022-07-21 DIAGNOSIS — N130 Hydronephrosis with ureteropelvic junction obstruction: Secondary | ICD-10-CM | POA: Diagnosis not present

## 2022-07-21 DIAGNOSIS — N401 Enlarged prostate with lower urinary tract symptoms: Secondary | ICD-10-CM | POA: Diagnosis not present

## 2022-07-21 DIAGNOSIS — R350 Frequency of micturition: Secondary | ICD-10-CM | POA: Diagnosis not present

## 2022-07-27 ENCOUNTER — Ambulatory Visit: Payer: PPO | Admitting: Dermatology

## 2022-08-04 DIAGNOSIS — U071 COVID-19: Secondary | ICD-10-CM | POA: Diagnosis not present

## 2022-08-04 DIAGNOSIS — R053 Chronic cough: Secondary | ICD-10-CM | POA: Diagnosis not present

## 2022-08-04 HISTORY — DX: COVID-19: U07.1

## 2022-08-17 ENCOUNTER — Ambulatory Visit: Payer: PPO | Admitting: Dermatology

## 2022-08-17 ENCOUNTER — Encounter: Payer: Self-pay | Admitting: Dermatology

## 2022-08-17 DIAGNOSIS — L570 Actinic keratosis: Secondary | ICD-10-CM

## 2022-08-17 DIAGNOSIS — Z85828 Personal history of other malignant neoplasm of skin: Secondary | ICD-10-CM

## 2022-08-17 DIAGNOSIS — L821 Other seborrheic keratosis: Secondary | ICD-10-CM | POA: Diagnosis not present

## 2022-08-17 DIAGNOSIS — L578 Other skin changes due to chronic exposure to nonionizing radiation: Secondary | ICD-10-CM

## 2022-08-17 DIAGNOSIS — L820 Inflamed seborrheic keratosis: Secondary | ICD-10-CM

## 2022-08-17 NOTE — Progress Notes (Signed)
   Follow-Up Visit   Subjective  Nathaniel Ibarra is a 81 y.o. male who presents for the following: Follow-up (Excision follow up - BCC of left inf cheek). The patient has spots, moles and lesions to be evaluated, some may be new or changing and the patient has concerns that these could be cancer.  The following portions of the chart were reviewed this encounter and updated as appropriate:   Tobacco  Allergies  Meds  Problems  Med Hx  Surg Hx  Fam Hx     Review of Systems:  No other skin or systemic complaints except as noted in HPI or Assessment and Plan.  Objective  Well appearing patient in no apparent distress; mood and affect are within normal limits.  A focused examination was performed including scalp, face, arms, hands. Relevant physical exam findings are noted in the Assessment and Plan.  Left inf cheek Well healed excision site  Scalp x 5, right dorsum hand x 1 (6) Erythematous thin papules/macules with gritty scale.   Right forearm Erythematous stuck-on, waxy papule or plaque   Assessment & Plan   History of basal cell carcinoma (BCC) Left inf cheek Clear. Observe for recurrence. Call clinic for new or changing lesions.  Recommend regular skin exams, daily broad-spectrum spf 30+ sunscreen use, and photoprotection.    AK (actinic keratosis) (6) Scalp x 5, right dorsum hand x 1 Destruction of lesion - Scalp x 5, right dorsum hand x 1 Complexity: simple   Destruction method: cryotherapy   Informed consent: discussed and consent obtained   Timeout:  patient name, date of birth, surgical site, and procedure verified Lesion destroyed using liquid nitrogen: Yes   Region frozen until ice ball extended beyond lesion: Yes   Outcome: patient tolerated procedure well with no complications   Post-procedure details: wound care instructions given    Inflamed seborrheic keratosis Right forearm Destruction of lesion - Right forearm Complexity: simple   Destruction  method: cryotherapy   Informed consent: discussed and consent obtained   Timeout:  patient name, date of birth, surgical site, and procedure verified Lesion destroyed using liquid nitrogen: Yes   Region frozen until ice ball extended beyond lesion: Yes   Outcome: patient tolerated procedure well with no complications   Post-procedure details: wound care instructions given    Actinic Damage - chronic, secondary to cumulative UV radiation exposure/sun exposure over time - diffuse scaly erythematous macules with underlying dyspigmentation - Recommend daily broad spectrum sunscreen SPF 30+ to sun-exposed areas, reapply every 2 hours as needed.  - Recommend staying in the shade or wearing long sleeves, sun glasses (UVA+UVB protection) and wide brim hats (4-inch brim around the entire circumference of the hat). - Call for new or changing lesions.  Seborrheic Keratoses - Stuck-on, waxy, tan-brown papules and/or plaques  - Benign-appearing - Discussed benign etiology and prognosis. - Observe - Call for any changes  Return in about 9 months (around 05/18/2023).  I, Ashok Cordia, CMA, am acting as scribe for Sarina Ser, MD . Documentation: I have reviewed the above documentation for accuracy and completeness, and I agree with the above.  Sarina Ser, MD

## 2022-08-17 NOTE — Patient Instructions (Signed)
Cryotherapy Aftercare  Wash gently with soap and water everyday.   Apply Vaseline and Band-Aid daily until healed.     Due to recent changes in healthcare laws, you may see results of your pathology and/or laboratory studies on MyChart before the doctors have had a chance to review them. We understand that in some cases there may be results that are confusing or concerning to you. Please understand that not all results are received at the same time and often the doctors may need to interpret multiple results in order to provide you with the best plan of care or course of treatment. Therefore, we ask that you please give us 2 business days to thoroughly review all your results before contacting the office for clarification. Should we see a critical lab result, you will be contacted sooner.   If You Need Anything After Your Visit  If you have any questions or concerns for your doctor, please call our main line at [phone] and press option 4 to reach your doctor's medical assistant. If no one answers, please leave a voicemail as directed and we will return your call as soon as possible. Messages left after 4 pm will be answered the following business day.   You may also send us a message via MyChart. We typically respond to MyChart messages within 1-2 business days.  For prescription refills, please ask your pharmacy to contact our office. Our fax number is [phone].  If you have an urgent issue when the clinic is closed that cannot wait until the next business day, you can page your doctor at the number below.    Please note that while we do our best to be available for urgent issues outside of office hours, we are not available 24/7.   If you have an urgent issue and are unable to reach us, you may choose to seek medical care at your doctor's office, retail clinic, urgent care center, or emergency room.  If you have a medical emergency, please immediately call 911 or go to the  emergency department.  Pager Numbers  - Dr. Kowalski: [phone]  - Dr. Moye: [phone]  - Dr. Stewart: [phone]  In the event of inclement weather, please call our main line at [phone] for an update on the status of any delays or closures.  Dermatology Medication Tips: Please keep the boxes that topical medications come in in order to help keep track of the instructions about where and how to use these. Pharmacies typically print the medication instructions only on the boxes and not directly on the medication tubes.   If your medication is too expensive, please contact our office at [phone] option 4 or send us a message through MyChart.   We are unable to tell what your co-pay for medications will be in advance as this is different depending on your insurance coverage. However, we may be able to find a substitute medication at lower cost or fill out paperwork to get insurance to cover a needed medication.   If a prior authorization is required to get your medication covered by your insurance company, please allow us 1-2 business days to complete this process.  Drug prices often vary depending on where the prescription is filled and some pharmacies may offer cheaper prices.  The website www.goodrx.com contains coupons for medications through different pharmacies. The prices here do not account for what the cost may be with help from insurance (it may be cheaper with your insurance), but the website can   give you the price if you did not use any insurance.  - You can print the associated coupon and take it with your prescription to the pharmacy.  - You may also stop by our office during regular business hours and pick up a GoodRx coupon card.  - If you need your prescription sent electronically to a different pharmacy, notify our office through [facility] MyChart or by phone at [phone] option 4.     Si Usted Necesita Algo Despus de Su Visita  Tambin puede  enviarnos un mensaje a travs de MyChart. Por lo general respondemos a los mensajes de MyChart en el transcurso de 1 a 2 das hbiles.  Para renovar recetas, por favor pida a su farmacia que se ponga en contacto con nuestra oficina. Nuestro nmero de fax es el [phone].  Si tiene un asunto urgente cuando la clnica est cerrada y que no puede esperar hasta el siguiente da hbil, puede llamar/localizar a su doctor(a) al nmero que aparece a continuacin.   Por favor, tenga en cuenta que aunque hacemos todo lo posible para estar disponibles para asuntos urgentes fuera del horario de oficina, no estamos disponibles las 24 horas del da, los 7 das de la semana.   Si tiene un problema urgente y no puede comunicarse con nosotros, puede optar por buscar atencin mdica  en el consultorio de su doctor(a), en una clnica privada, en un centro de atencin urgente o en una sala de emergencias.  Si tiene una emergencia mdica, por favor llame inmediatamente al 911 o vaya a la sala de emergencias.  Nmeros de bper  - Dr. Kowalski: [phone]  - Dra. Moye: [phone]  - Dra. Stewart: [phone]  En caso de inclemencias del tiempo, por favor llame a nuestra lnea principal al [phone] para una actualizacin sobre el estado de cualquier retraso o cierre.  Consejos para la medicacin en dermatologa: Por favor, guarde las cajas en las que vienen los medicamentos de uso tpico para ayudarle a seguir las instrucciones sobre dnde y cmo usarlos. Las farmacias generalmente imprimen las instrucciones del medicamento slo en las cajas y no directamente en los tubos del medicamento.   Si su medicamento es muy caro, por favor, pngase en contacto con nuestra oficina llamando al [phone] y presione la opcin 4 o envenos un mensaje a travs de MyChart.   No podemos decirle cul ser su copago por los medicamentos por adelantado ya que esto es diferente dependiendo de la cobertura de su seguro.  Sin embargo, es posible que podamos encontrar un medicamento sustituto a menor costo o llenar un formulario para que el seguro cubra el medicamento que se considera necesario.   Si se requiere una autorizacin previa para que su compaa de seguros cubra su medicamento, por favor permtanos de 1 a 2 das hbiles para completar este proceso.  Los precios de los medicamentos varan con frecuencia dependiendo del lugar de dnde se surte la receta y alguna farmacias pueden ofrecer precios ms baratos.  El sitio web www.goodrx.com tiene cupones para medicamentos de diferentes farmacias. Los precios aqu no tienen en cuenta lo que podra costar con la ayuda del seguro (puede ser ms barato con su seguro), pero el sitio web puede darle el precio si no utiliz ningn seguro.  - Puede imprimir el cupn correspondiente y llevarlo con su receta a la farmacia.  - Tambin puede pasar por nuestra oficina durante el horario de atencin regular y recoger una tarjeta de cupones de GoodRx.  -   Si necesita que su receta se enve electrnicamente a una farmacia diferente, informe a nuestra oficina a travs de MyChart de [place] o por telfono llamando al [phone] y presione la opcin 4.  

## 2022-10-02 IMAGING — DX DG CHEST 1V PORT
1 series · 1 of 1 positions shown · non-contrast
Comparison: 05/16/2020

CLINICAL DATA: Shortness of breath

EXAM:
PORTABLE CHEST 1 VIEW

[chest ap]
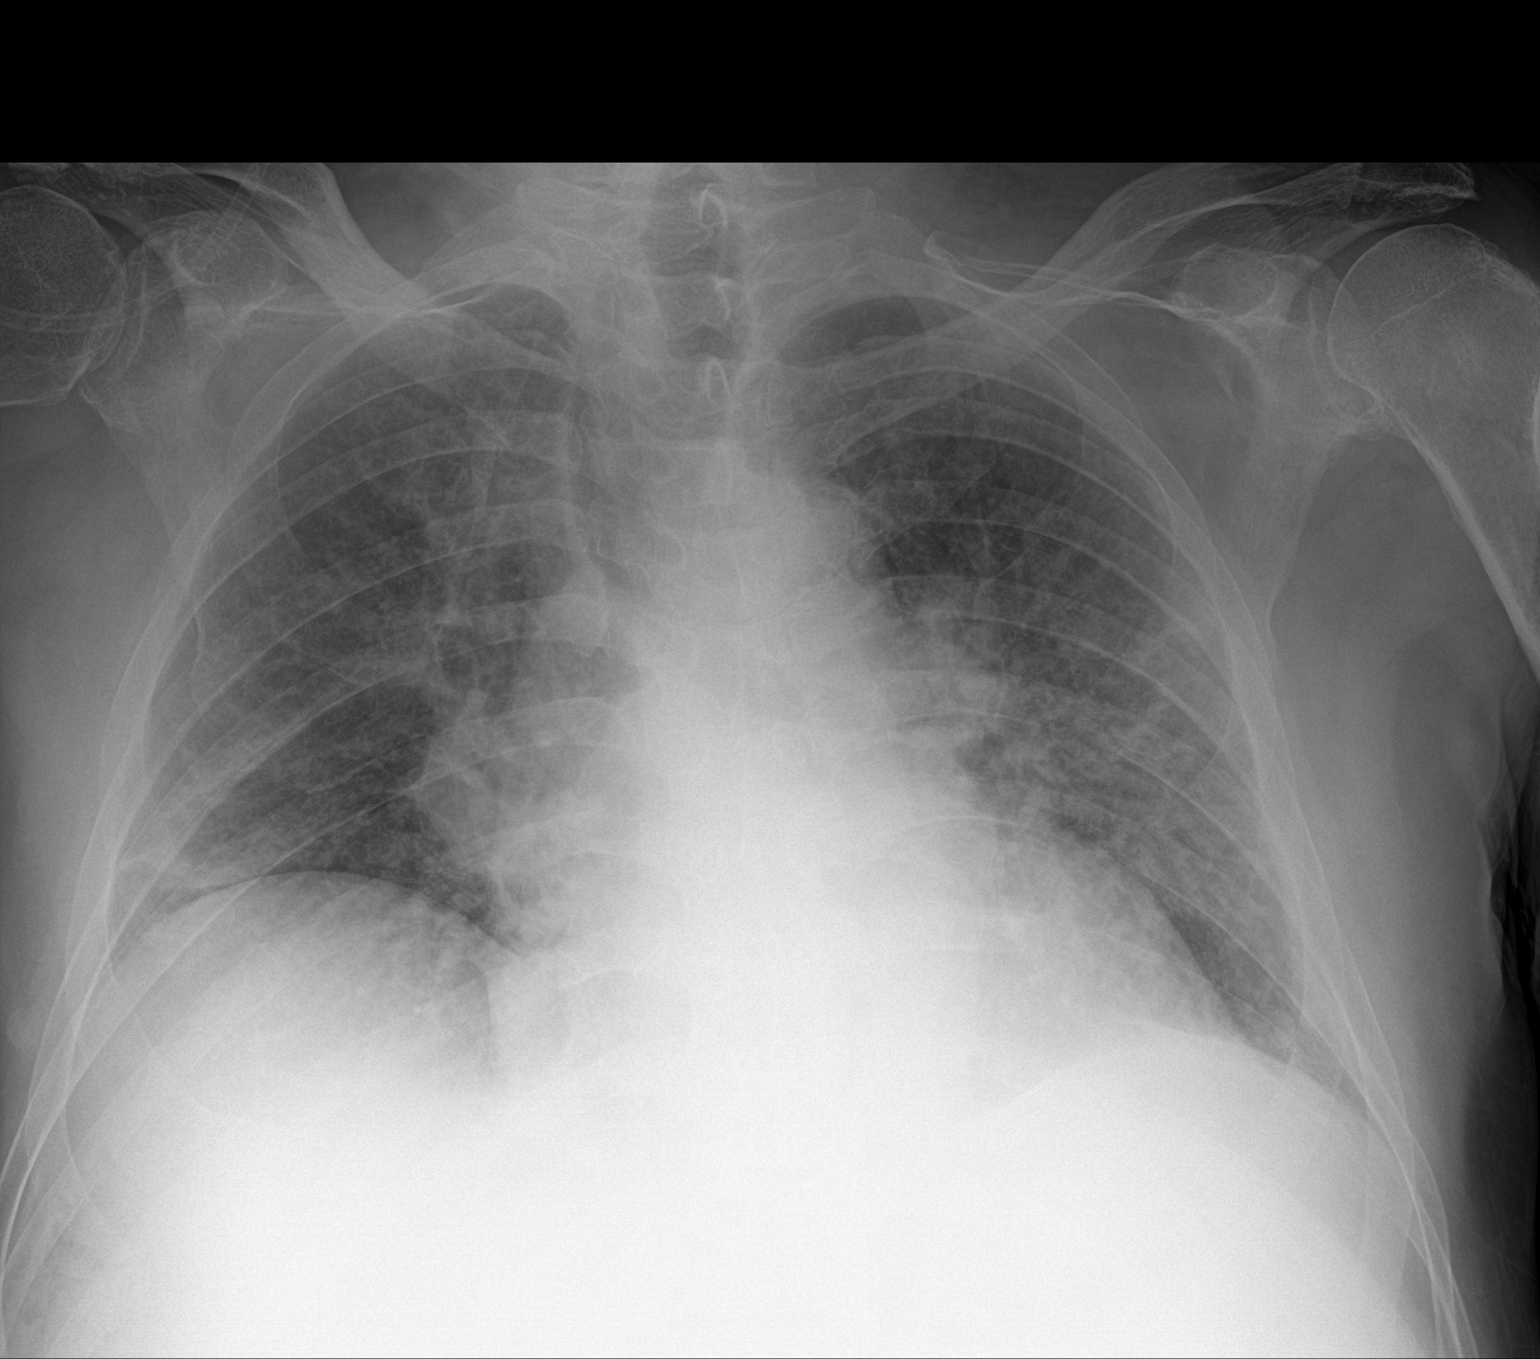

[1 of 1 positions shown; findings below may reference images not displayed]

FINDINGS: Cardiomegaly and vascular pedicle widening. Diffuse interstitial
prominence with subtle asymmetric density in the left mid lung. No
visible effusion or pneumothorax.
IMPRESSION: 1. Cardiomegaly and vascular congestion.
2. Subtle asymmetric density over the left mid chest, please
correlate for pneumonia symptoms.

## 2022-10-11 DIAGNOSIS — M1712 Unilateral primary osteoarthritis, left knee: Secondary | ICD-10-CM | POA: Diagnosis not present

## 2022-10-12 ENCOUNTER — Encounter: Payer: Self-pay | Admitting: Orthopedic Surgery

## 2022-10-12 ENCOUNTER — Encounter
Admission: RE | Admit: 2022-10-12 | Discharge: 2022-10-12 | Disposition: A | Discharge status: Home / Self Care | Payer: PPO | Source: Ambulatory Visit | Attending: Orthopedic Surgery | Admitting: Orthopedic Surgery

## 2022-10-12 VITALS — BP 157/76 | HR 63 | Temp 98.1°F | Resp 18 | Ht 72.0 in | Wt 196.0 lb

## 2022-10-12 DIAGNOSIS — K219 Gastro-esophageal reflux disease without esophagitis: Secondary | ICD-10-CM | POA: Insufficient documentation

## 2022-10-12 DIAGNOSIS — I5022 Chronic systolic (congestive) heart failure: Secondary | ICD-10-CM | POA: Diagnosis not present

## 2022-10-12 DIAGNOSIS — I249 Acute ischemic heart disease, unspecified: Secondary | ICD-10-CM | POA: Diagnosis not present

## 2022-10-12 DIAGNOSIS — E039 Hypothyroidism, unspecified: Secondary | ICD-10-CM | POA: Diagnosis not present

## 2022-10-12 DIAGNOSIS — R001 Bradycardia, unspecified: Secondary | ICD-10-CM | POA: Insufficient documentation

## 2022-10-12 DIAGNOSIS — I5023 Acute on chronic systolic (congestive) heart failure: Secondary | ICD-10-CM

## 2022-10-12 DIAGNOSIS — I501 Left ventricular failure: Secondary | ICD-10-CM | POA: Insufficient documentation

## 2022-10-12 DIAGNOSIS — I2510 Atherosclerotic heart disease of native coronary artery without angina pectoris: Secondary | ICD-10-CM | POA: Insufficient documentation

## 2022-10-12 DIAGNOSIS — Z01818 Encounter for other preprocedural examination: Secondary | ICD-10-CM | POA: Diagnosis not present

## 2022-10-12 DIAGNOSIS — Z01812 Encounter for preprocedural laboratory examination: Secondary | ICD-10-CM

## 2022-10-12 DIAGNOSIS — M1712 Unilateral primary osteoarthritis, left knee: Secondary | ICD-10-CM

## 2022-10-12 HISTORY — DX: Hypothyroidism, unspecified: E03.9

## 2022-10-12 LAB — CBC
HCT: 42.6 % (ref 39.0–52.0)
Hemoglobin: 13.6 g/dL (ref 13.0–17.0)
MCH: 30.4 pg (ref 26.0–34.0)
MCHC: 31.9 g/dL (ref 30.0–36.0)
MCV: 95.1 fL (ref 80.0–100.0)
Platelets: 325 10*3/uL (ref 150–400)
RBC: 4.48 MIL/uL (ref 4.22–5.81)
RDW: 13.3 % (ref 11.5–15.5)
WBC: 8.7 10*3/uL (ref 4.0–10.5)
nRBC: 0 % (ref 0.0–0.2)

## 2022-10-12 LAB — SEDIMENTATION RATE: Sed Rate: 7 mm/hr (ref 0–20)

## 2022-10-12 LAB — URINALYSIS, ROUTINE W REFLEX MICROSCOPIC
Bilirubin Urine: NEGATIVE
Glucose, UA: NEGATIVE mg/dL
Hgb urine dipstick: NEGATIVE
Ketones, ur: NEGATIVE mg/dL
Leukocytes,Ua: NEGATIVE
Nitrite: NEGATIVE
Protein, ur: NEGATIVE mg/dL
Specific Gravity, Urine: 1.015 (ref 1.005–1.030)
pH: 6 (ref 5.0–8.0)

## 2022-10-12 LAB — TYPE AND SCREEN
ABO/RH(D): O POS
Antibody Screen: NEGATIVE

## 2022-10-12 LAB — COMPREHENSIVE METABOLIC PANEL
ALT: 20 U/L (ref 0–44)
AST: 22 U/L (ref 15–41)
Albumin: 3.7 g/dL (ref 3.5–5.0)
Alkaline Phosphatase: 88 U/L (ref 38–126)
Anion gap: 6 (ref 5–15)
BUN: 23 mg/dL (ref 8–23)
CO2: 30 mmol/L (ref 22–32)
Calcium: 8.7 mg/dL — ABNORMAL LOW (ref 8.9–10.3)
Chloride: 105 mmol/L (ref 98–111)
Creatinine, Ser: 0.88 mg/dL (ref 0.61–1.24)
GFR, Estimated: >60 mL/min (ref >60)
Glucose, Bld: 107 mg/dL — ABNORMAL HIGH (ref 70–99)
Potassium: 4.6 mmol/L (ref 3.5–5.1)
Sodium: 141 mmol/L (ref 135–145)
Total Bilirubin: 0.5 mg/dL (ref 0.3–1.2)
Total Protein: 6.3 g/dL — ABNORMAL LOW (ref 6.5–8.1)

## 2022-10-12 LAB — SURGICAL PCR SCREEN
MRSA, PCR: NEGATIVE
Staphylococcus aureus: NEGATIVE

## 2022-10-12 LAB — C-REACTIVE PROTEIN: CRP: 0.7 mg/dL (ref <1.0)

## 2022-10-12 NOTE — Discharge Instructions (Signed)
Instructions after Total Knee Replacement   Catarino Vold P. Reichen Hutzler, Jr., M.D.     Dept. of Orthopaedics & Sports Medicine  [facility]  [address]  [place], [place]  [address]  Phone: [phone]   Fax: [phone]    DIET: Drink plenty of non-alcoholic fluids. Resume your normal diet. Include foods high in fiber.  ACTIVITY:  You may use crutches or a walker with weight-bearing as tolerated, unless instructed otherwise. You may be weaned off of the walker or crutches by your Physical Therapist.  Do NOT place pillows under the knee. Anything placed under the knee could limit your ability to straighten the knee.   Continue doing gentle exercises. Exercising will reduce the pain and swelling, increase motion, and prevent muscle weakness.   Please continue to use the TED compression stockings for 6 weeks. You may remove the stockings at night, but should reapply them in the morning. Do not drive or operate any equipment until instructed.  WOUND CARE:  Continue to use the PolarCare or ice packs periodically to reduce pain and swelling. You may bathe or shower after the staples are removed at the first office visit following surgery.  MEDICATIONS: You may resume your regular medications. Please take the pain medication as prescribed on the medication. Do not take pain medication on an empty stomach. You have been given a prescription for a blood thinner (Lovenox or Coumadin). Please take the medication as instructed. (NOTE: After completing a 2 week course of Lovenox, take one Enteric-coated aspirin once a day. This along with elevation will help reduce the possibility of phlebitis in your operated leg.) Do not drive or drink alcoholic beverages when taking pain medications.  CALL THE OFFICE FOR: Temperature above 101 degrees Excessive bleeding or drainage on the dressing. Excessive swelling, coldness, or paleness of the toes. Persistent nausea and vomiting.  FOLLOW-UP:  You  should have an appointment to return to the office in 10-14 days after surgery. Arrangements have been made for continuation of Physical Therapy (either home therapy or outpatient therapy).   [facility] Department Directory         [email or web address]       [email or web address]          Cardiology  Appointments: [place] - [phone] [place] - [phone]  Endocrinology  Appointments: [place] - [phone] [place] - [phone]  Gastroenterology  Appointments: [place] - [phone] [place] - [phone]        General Surgery   Appointments: [place] - [phone]  Internal Medicine/Family Medicine  Appointments: [place] - [phone] [place] - [phone] [place] - [phone]  Metabolic and Weigh Loss Surgery  Appointments: [place] - [phone]        Neurology  Appointments: [place] - [phone] [place] - [phone]  Neurosurgery  Appointments: [place] - [phone]  Obstetrics & Gynecology  Appointments: [place] - [phone] [place] - [phone]        Pediatrics  Appointments: [place] - [phone] [place] - [phone]  Physiatry  Appointments: [place] -[phone]  Physical Therapy  Appointments: [place] - [phone] [place] - [phone]        Podiatry  Appointments: [place] - [phone] [place] - [phone]  Pulmonology  Appointments: [place] - [phone]  Rheumatology  Appointments: [place] - [phone]        [place] Location: [facility]  [address] [place], [place]  [address]  [place] Location: [facility] [address] [place], [place]  [address]  [place] Location: [facility] [address] [place], [place]  [address]    

## 2022-10-12 NOTE — Patient Instructions (Addendum)
Your procedure is scheduled on: Monday, November 6 Report to the Registration Desk on the 1st floor of the [facility]. To find out your arrival time, please call [phone] between 1PM - 3PM on: Friday, November 3 If your arrival time is 6:00 am, do not arrive prior to that time as the [facility] entrance doors do not open until 6:00 am.  REMEMBER: Instructions that are not followed completely may result in serious medical risk, up to and including death; or upon the discretion of your surgeon and anesthesiologist your surgery may need to be rescheduled.  Do not eat food after midnight the night before surgery.  No gum chewing, lozengers or hard candies.  You may however, drink CLEAR liquids up to 2 hours before you are scheduled to arrive for your surgery. Do not drink anything within 2 hours of your scheduled arrival time.  Clear liquids include: - water  - apple juice without pulp - gatorade (not RED colors) - black coffee or tea (Do NOT add milk or creamers to the coffee or tea) Do NOT drink anything that is not on this list.  In addition, your doctor has ordered for you to drink the provided  Ensure Pre-Surgery Clear Carbohydrate Drink  Drinking this carbohydrate drink up to two hours before surgery helps to reduce insulin resistance and improve patient outcomes. Please complete drinking 2 hours prior to scheduled arrival time.  TAKE THESE MEDICATIONS THE MORNING OF SURGERY WITH A SIP OF WATER:  Albuterol inhaler Amiodarone Finasteride Levothyroxine Paroxetine  Use inhalers on the day of surgery and bring to the hospital.  Continue taking all other prescribed medications; including the aspirin up until the morning of surgery. Only take the medication listed above on the day of surgery.  One week prior to surgery: starting October 30 Stop Anti-inflammatories (NSAIDS) such as Advil, Aleve, Ibuprofen, Motrin, Naproxen, Naprosyn and Aspirin based products such as  Excedrin, Goodys Powder, BC Powder. Stop ANY OVER THE COUNTER supplements until after surgery. You may however, continue to take Tylenol if needed for pain up until the day of surgery.  No Alcohol for 24 hours before or after surgery.  No Smoking including e-cigarettes for 24 hours prior to surgery.  No chewable tobacco products for at least 6 hours prior to surgery.  No nicotine patches on the day of surgery.  Do not use any "recreational" drugs for at least a week prior to your surgery.  Please be advised that the combination of cocaine and anesthesia may have negative outcomes, up to and including death. If you test positive for cocaine, your surgery will be cancelled.  On the morning of surgery brush your teeth with toothpaste and water, you may rinse your mouth with mouthwash if you wish. Do not swallow any toothpaste or mouthwash.  Use CHG Soap as directed on instruction sheet.  Do not wear jewelry, make-up, hairpins, clips or nail polish.  Do not wear lotions, powders, or perfumes.   Do not shave body from the neck down 48 hours prior to surgery just in case you cut yourself which could leave a site for infection.  Also, freshly shaved skin may become irritated if using the CHG soap.  Contact lenses, hearing aids and dentures may not be worn into surgery.  Do not bring valuables to the hospital. [facility] is not responsible for any missing/lost belongings or valuables.   Notify your doctor if there is any change in your medical condition (cold, fever, infection).  Wear comfortable clothing (specific to your surgery type) to the hospital.  After surgery, you can help prevent lung complications by doing breathing exercises.  Take deep breaths and cough every 1-2 hours. Your doctor may order a device called an Incentive Spirometer to help you take deep breaths.  If you are being admitted to the hospital overnight, leave your suitcase in the car. After surgery it may be  brought to your room.  If you are being discharged the day of surgery, you will not be allowed to drive home. You will need a responsible adult (18 years or older) to drive you home and stay with you that night.   If you are taking public transportation, you will need to have a responsible adult (18 years or older) with you. Please confirm with your physician that it is acceptable to use public transportation.   Please call the [facility] Dept. at [phone] if you have any questions about these instructions.  Surgery Visitation Policy:  Patients undergoing a surgery or procedure may have two family members or support persons with them as long as the person is not COVID-19 positive or experiencing its symptoms.   Inpatient Visitation:    Visiting hours are 7 a.m. to 8 p.m. Up to four visitors are allowed at one time in a patient room, including children. The visitors may rotate out with other people during the day. One designated support person (adult) may remain overnight.      Preparing for Surgery with CHLORHEXIDINE GLUCONATE (CHG) Soap  Chlorhexidine Gluconate (CHG) Soap  o An antiseptic cleaner that kills germs and bonds with the skin to continue killing germs even after washing  o Used for showering the night before surgery and morning of surgery  Before surgery, you can play an important role by reducing the number of germs on your skin.  CHG (Chlorhexidine gluconate) soap is an antiseptic cleanser which kills germs and bonds with the skin to continue killing germs even after washing.  Please do not use if you have an allergy to CHG or antibacterial soaps. If your skin becomes reddened/irritated stop using the CHG.  1. Shower the NIGHT BEFORE SURGERY and the MORNING OF SURGERY with CHG soap.  2. If you choose to wash your hair, wash your hair first as usual with your normal shampoo.  3. After shampooing, rinse your hair and body thoroughly to remove the  shampoo.  4. Use CHG as you would any other liquid soap. You can apply CHG directly to the skin and wash gently with a scrungie or a clean washcloth.  5. Apply the CHG soap to your body only from the neck down. Do not use on open wounds or open sores. Avoid contact with your eyes, ears, mouth, and genitals (private parts). Wash face and genitals (private parts) with your normal soap.  6. Wash thoroughly, paying special attention to the area where your surgery will be performed.  7. Thoroughly rinse your body with warm water.  8. Do not shower/wash with your normal soap after using and rinsing off the CHG soap.  9. Pat yourself dry with a clean towel.  10. Wear clean pajamas to bed the night before surgery.  12. Place clean sheets on your bed the night of your first shower and do not sleep with pets.  13. Shower again with the CHG soap on the day of surgery prior to arriving at the hospital.  14. Do not apply any deodorants/lotions/powders.  15. Please  wear clean clothes to the hospital.

## 2022-10-13 ENCOUNTER — Ambulatory Visit: Payer: PPO | Admitting: Podiatry

## 2022-10-13 ENCOUNTER — Encounter: Payer: Self-pay | Admitting: Podiatry

## 2022-10-13 DIAGNOSIS — M79675 Pain in left toe(s): Secondary | ICD-10-CM | POA: Diagnosis not present

## 2022-10-13 DIAGNOSIS — B351 Tinea unguium: Secondary | ICD-10-CM | POA: Diagnosis not present

## 2022-10-13 DIAGNOSIS — D689 Coagulation defect, unspecified: Secondary | ICD-10-CM

## 2022-10-13 DIAGNOSIS — M79674 Pain in right toe(s): Secondary | ICD-10-CM | POA: Diagnosis not present

## 2022-10-13 NOTE — Progress Notes (Signed)
This patient returns to my office for at risk foot care.  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  This patient is unable to cut nails himself since the patient cannot reach his nails.These nails are painful walking and wearing shoes.  This patient presents for at risk foot care today.  General Appearance  Alert, conversant and in no acute stress.  Vascular  Dorsalis pedis and posterior tibial  pulses are palpable  bilaterally.  Capillary return is within normal limits  bilaterally. Temperature is within normal limits  bilaterally.  Neurologic  Senn-Weinstein monofilament wire test within normal limits  bilaterally. Muscle power within normal limits bilaterally.  Nails Thick disfigured discolored nails with subungual debris  hallux nails  bilaterally. No evidence of bacterial infection or drainage bilaterally.  Orthopedic  No limitations of motion  feet .  No crepitus or effusions noted.  No bony pathology or digital deformities noted.  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  Skin  normotropic skin with no porokeratosis noted bilaterally.  No signs of infections or ulcers noted.     Onychomycosis  Pain in right toes  Pain in left toes  Consent was obtained for treatment procedures.   Mechanical debridement of nails 1-5  bilaterally performed with a nail nipper.  Filed with dremel without incident.    Return office visit   3 months                  Told patient to return for periodic foot care and evaluation due to potential at risk complications.   Gardiner Barefoot DPM

## 2022-10-19 ENCOUNTER — Encounter: Payer: Self-pay | Admitting: Orthopedic Surgery

## 2022-10-19 NOTE — Progress Notes (Signed)
Perioperative Services  Pre-Admission/Anesthesia Testing Clinical Review  Date: 10/21/22  Patient Demographics:  Name: Nathaniel Ibarra DOB:   Feb 10, 1941 MRN:   [id number]  Planned Surgical Procedure(s):    Case: [id number] Date/Time: 10/24/22 0700   Procedure: COMPUTER ASSISTED TOTAL KNEE ARTHROPLASTY (Left: Knee)   Anesthesia type: Choice   Pre-op diagnosis: Primary osteoarthritis of left knee M17.12   Location: [facility] OR ROOM 01 / [place] [facility]   Surgeons: Dereck Leep, MD   NOTE: Available PAT nursing documentation and vital signs have been reviewed. Clinical nursing staff has updated patient's PMH/PSHx, current medication list, and drug allergies/intolerances to ensure comprehensive history available to assist in medical decision making as it pertains to the aforementioned surgical procedure and anticipated anesthetic course. Extensive review of available clinical information performed. Nathaniel Ibarra PMH and PSHx updated with any diagnoses/procedures that  may have been inadvertently omitted during his intake with the pre-admission testing department's nursing staff.  Clinical Discussion:  Nathaniel Ibarra is a 81 y.o. male who is submitted for pre-surgical anesthesia review and clearance prior to him undergoing the above procedure. Patient has never been a smoker. Pertinent PMH includes: CAD, NSTEMI, LBBB, cardiomyopathy, HFrEF, PAF, chronic peripheral edema, bradycardia, aortic atherosclerosis, HTN, HLD, hypothyroidism, GERD (no daily Tx), OA, BPH, depression, sleep difficulties.  Patient is followed by cardiology Ubaldo Glassing, MD). He was last seen in the cardiology clinic on 06/28/2022; notes reviewed.  At the time of this clinic visit, patient doing well overall from a cardiovascular perspective.  He denied any episodes of chest pain, shortness breath, PND, orthopnea, vertiginous symptoms, or presyncope/syncope.  Patient with continued lower extremity edema, however he noted  that symptoms have improved with extremity elevation and reduction and dietary sodium intake.  Patient with a past medical history significant for cardiovascular diagnoses.  Patient with questionable NSTEMI in 2016.  Documentation varies between providers asked whether or not patient ruled in for cardiovascular event.  Diagnostic left heart catheterization was performed on 09/01/2015 revealing normal left ventricular systolic function with a hyperdynamic LVEF of 70%.  There was multivessel CAD; 80% mid LAD, 50% mid to distal LCx, and 50% distal LCx.  PCI was subsequently performed placing a 2.75 x 15 mm Xience Alpine DES x1 to the mid LAD lesion yielding excellent angiographic result and TIMI-3 flow.  Repeat diagnostic left heart catheterization was performed on 11/14/2017 revealing multivessel CAD; 30% ostial D1-D1, 40% proximal LCx, and 40% mid LAD.  Previously placed stent was widely patent.  Given the nonobstructive nature of her disease, further intervention was deferred opting for medical management.  Patient with a history of cardiomyopathy with resulting HFrEF.  Cardiac function has been serially monitored via TTE since time of diagnosis.  Most recent TTE was performed on 03/02/2022 revealing a normal left ventricular systolic function with an EF of >55%.  There was mild LVH and left atrial enlargement.  Left ventricular diastolic Doppler parameters consistent with abnormal relaxation (G1DD). There was mild mitral, tricuspid, and pulmonic valve regurgitation.  There was no evidence of a significant transvalvular gradient to suggest stenosis.  Patient with an atrial fibrillation diagnosis; CHA2DS2-VASc Score = 5 (age x 2, HFrEF, HTN, previous MI). His rate and rhythm are currently being maintained on oral amiodarone.  Patient not currently chronically anticoagulated at this time.  Chronic anticoagulation therapy was discontinued due to financial constraints, overall medical noncompliance, and  history of mechanical falls.  Blood pressure elevated at 140/92 mmHg on currently prescribed diuretic (spironolactone)  and ARB (losartan) therapies..  Patient not currently taking any type of lipid-lowering therapies for his HLD diagnosis and further ASCVD prevention.  He is not diabetic.  Patient does not have an OSAH diagnosis.  Functional capacity limited by age and multiple medical comorbidities.  Patient questionably able to achieve 4 METS of physical activity without experiencing angina/anginal equivalent symptoms.  No changes were made to his medication regimen.  Patient to follow-up with outpatient cardiology in 6 months or sooner if needed.  Nathaniel Ibarra is scheduled to undergo an elective COMPUTER ASSISTED LEFT TOTAL KNEE ARTHROPLASTY on 10/24/2022 with Dr. Skip Estimable, MD.  Given his past medical history significant for cardiovascular diagnoses, presurgical cardiac clearance was sought by the PAT team. Per cardiology, "this patient is optimized for surgery and may proceed with the planned procedural course with a LOW risk of significant perioperative cardiovascular complications".  In review of his medication reconciliation, it is noted the patient is on daily antiplatelet therapy.  He has been instructed on recommendations from his cardiologist for holding his daily low-dose ASA for 5 days prior to his procedure with plans to restart as soon as postoperative bleeding risk felt to be minimized by his primary attending surgeon.  Patient is aware that his last dose of ASA should be on 10/18/2022.  Patient denies previous perioperative complications with anesthesia in the past. In review of the available records, it is noted that patient underwent a general anesthetic course at [facility] (ASA III) in 11/2020 without documented complications.      10/12/2022   11:05 AM 06/22/2022   11:20 AM 06/22/2022   11:19 AM  Vitals with BMI  Height '6\' 0"'$  '6\' 0"'$    Weight 196 lbs 186 lbs 15 oz    BMI 85.63 14.97   Systolic 026  378  Diastolic 76  56  Pulse 63  60    Providers/Specialists:   NOTE: Primary physician provider listed below. Patient may have been seen by APP or partner within same practice.   PROVIDER ROLE / SPECIALTY LAST OV  Hooten, Laurice Record, MD Orthopedics (Surgeon) 10/11/2022  [name], MD Primary Care Provider ???  Bartholome Bill, MD Cardiology 06/28/2022   Allergies:  Patient has no known allergies.  Current Home Medications:   No current facility-administered medications for this encounter.    acetaminophen (TYLENOL) 325 MG tablet   albuterol (VENTOLIN HFA) 108 (90 Base) MCG/ACT inhaler   amiodarone (PACERONE) 200 MG tablet   aspirin EC 81 MG tablet   finasteride (PROSCAR) 5 MG tablet   levothyroxine (SYNTHROID) 100 MCG tablet   PARoxetine (PAXIL) 20 MG tablet   spironolactone (ALDACTONE) 25 MG tablet   tamsulosin (FLOMAX) 0.4 MG CAPS capsule   traZODone (DESYREL) 50 MG tablet   History:   Past Medical History:  Diagnosis Date   Aortic atherosclerosis (HCC)    Basal cell carcinoma 05/18/2022   Left inf cheek - EDC   Bladder cancer ([place]) 2008   BPH (benign prostatic hyperplasia)    Bradycardia    Cardiomyopathy ([place])    a.) TTE 08/17/2015: EF 55%; b.) LHC 09/01/2015: EF 70%; c.) LHC 11/14/2017: EF >55%; d.) TTE 11/15/2017: EF 45%; e.) MPI 08/05/2019: EF 45%; f.) TTE 05/17/2020: EF 25-30%; g.) TTE 07/16/2020: EF >55%; h.) TTE 03/02/2022: EF >55%   Cataract, bilateral    Coronary artery disease    a.) questionable NSTEMI 2016; b.) LHC/PCI 09/01/2015: 80% mLAD (2.75 x 15 mm Xience Alpine DES), 50% m-dLCx,  50% dLCx; c.) LHC 11/14/2017: 30% oD1-D1, 40% pLCx, 40% mLAD -- med mgmt   Depression    GERD (gastroesophageal reflux disease)    HFrEF (heart failure with reduced ejection fraction) ([place])    a.) TTE 08/17/2015: EF 55%, anteroseptal HK, triv PR/TR, G1DD; b.) TTE 11/15/2017: EF 45%, mild LAE, mild MR, mod RVE; c.) TTE 05/17/2020: EF  25-30%, glob HK, mod LV dil, mild LVH, mild BAE, mod MR, triv AR; d.) TTE 07/16/2020: EF >55%, mild LVH, triv MR, mild TR/PR; e.) TTE 03/02/2022: EF >55%, mild LVH, LAE, mild MR/TR/PR, G1DD   History of 2019 novel coronavirus disease (COVID-19)    a.) 12/2020; b.) 08/04/2022   HLD (hyperlipidemia)    Hypertension    Hypothyroidism    LBBB (left bundle branch block)    Long term current use of amiodarone    NSTEMI (non-ST elevated myocardial infarction) ([place]) 2016   Osteoarthritis    Paroxysmal atrial fibrillation (HCC)    a.) CHA2DS2VASc = 5 (age x 2, HFrEF, HTN, previous MI);  b.) rate/rhythm maintained on oral amiodarone; no chronic anticoagulation (discontinued due to cost, medical noncompliance, falls)   Peripheral edema    Pneumonia due to COVID-19 virus 12/2020   Sleep difficulties    a.) uses trazodone PRN   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   L mid dorsum forearm - ED&C   Past Surgical History:  Procedure Laterality Date   CARDIAC CATHETERIZATION N/A 09/01/2015   Procedure: Left Heart Cath and Coronary Angiography;  Surgeon: Dionisio David, MD;  Location: [place] [facility];  Service: Cardiovascular;  Laterality: N/A;   CARDIAC CATHETERIZATION N/A 09/01/2015   Procedure: Coronary Stent Intervention;  Surgeon: Yolonda Kida, MD;  Location: [place] [facility];  Service: Cardiovascular;  Laterality: N/A;   CATARACT EXTRACTION W/PHACO Left 11/23/2016   Procedure: CATARACT EXTRACTION PHACO AND INTRAOCULAR LENS PLACEMENT (IOC);  Surgeon: Leandrew Koyanagi, MD;  Location: [facility];  Service: Ophthalmology;  Laterality: Left;   CATARACT EXTRACTION W/PHACO Right 02/01/2017   Procedure: CATARACT EXTRACTION PHACO AND INTRAOCULAR LENS PLACEMENT (IOC) Complicated  right toric lens;  Surgeon: Leandrew Koyanagi, MD;  Location: [facility];  Service: Ophthalmology;  Laterality: Right;  Malyugin toric Lens   LEFT HEART CATH AND CORONARY ANGIOGRAPHY Right  11/14/2017   Procedure: LEFT HEART CATH AND CORONARY ANGIOGRAPHY;  Surgeon: Dionisio David, MD;  Location: [place] [facility];  Service: Cardiovascular;  Laterality: Right;   SCROTAL EXPLORATION Left 11/24/2020   Procedure: SCROTUM EXPLORATION WITH LEFT ORCHIECTOMY;  Surgeon: Robley Fries, MD;  Location: [facility];  Service: Urology;  Laterality: Left;   TONSILLECTOMY     Family History  Problem Relation Age of Onset   Emphysema Mother    Emphysema Father    Social History   Tobacco Use   Smoking status: Never   Smokeless tobacco: Never  Vaping Use   Vaping Use: Never used  Substance Use Topics   Alcohol use: No   Drug use: No    Pertinent Clinical Results:  LABS: Labs reviewed: Acceptable for surgery.  Component Date Value Ref Range Status   WBC 10/12/2022 8.7  4.0 - 10.5 K/uL Final   RBC 10/12/2022 4.48  4.22 - 5.81 MIL/uL Final   Hemoglobin 10/12/2022 13.6  13.0 - 17.0 g/dL Final   HCT 10/12/2022 42.6  39.0 - 52.0 % Final   MCV 10/12/2022 95.1  80.0 - 100.0 fL Final   MCH 10/12/2022 30.4  26.0 - 34.0 pg Final   MCHC 10/12/2022 31.9  30.0 - 36.0 g/dL Final   RDW 10/12/2022 13.3  11.5 - 15.5 % Final   Platelets 10/12/2022 325  150 - 400 K/uL Final   nRBC 10/12/2022 0.0  0.0 - 0.2 % Final   Performed at [facility], [place], [place] [address]   Sodium 10/12/2022 141  135 - 145 mmol/L Final   Potassium 10/12/2022 4.6  3.5 - 5.1 mmol/L Final   Chloride 10/12/2022 105  98 - 111 mmol/L Final   CO2 10/12/2022 30  22 - 32 mmol/L Final   Glucose, Bld 10/12/2022 107 (H)  70 - 99 mg/dL Final   Glucose reference range applies only to samples taken after fasting for at least 8 hours.   BUN 10/12/2022 23  8 - 23 mg/dL Final   Creatinine, Ser 10/12/2022 0.88  0.61 - 1.24 mg/dL Final   Calcium 10/12/2022 8.7 (L)  8.9 - 10.3 mg/dL Final   Total Protein 10/12/2022 6.3 (L)  6.5 - 8.1 g/dL Final   Albumin 10/12/2022 3.7  3.5 - 5.0 g/dL Final   AST  10/12/2022 22  15 - 41 U/L Final   ALT 10/12/2022 20  0 - 44 U/L Final   Alkaline Phosphatase 10/12/2022 88  38 - 126 U/L Final   Total Bilirubin 10/12/2022 0.5  0.3 - 1.2 mg/dL Final   GFR, Estimated 10/12/2022 >60  >60 mL/min Final   Comment: (NOTE) Calculated using the CKD-EPI Creatinine Equation (2021)    Anion gap 10/12/2022 6  5 - 15 Final   Performed at [facility], [place], [place] [address]   Color, Urine 10/12/2022 YELLOW (A)  YELLOW Final   APPearance 10/12/2022 CLEAR (A)  CLEAR Final   Specific Gravity, Urine 10/12/2022 1.015  1.005 - 1.030 Final   pH 10/12/2022 6.0  5.0 - 8.0 Final   Glucose, UA 10/12/2022 NEGATIVE  NEGATIVE mg/dL Final   Hgb urine dipstick 10/12/2022 NEGATIVE  NEGATIVE Final   Bilirubin Urine 10/12/2022 NEGATIVE  NEGATIVE Final   Ketones, ur 10/12/2022 NEGATIVE  NEGATIVE mg/dL Final   Protein, ur 10/12/2022 NEGATIVE  NEGATIVE mg/dL Final   Nitrite 10/12/2022 NEGATIVE  NEGATIVE Final   Leukocytes,Ua 10/12/2022 NEGATIVE  NEGATIVE Final   Performed at [facility], [address] [place], [place] [address]   CRP 10/12/2022 0.7  <1.0 mg/dL Final   Performed at [facility] [address] [place], [place] [address]   Sed Rate 10/12/2022 7  0 - 20 mm/hr Final   Performed at [facility], [address] [place], [place] [address]   ABO/RH(D) 10/12/2022 O POS   Final   Antibody Screen 10/12/2022 NEG   Final   Sample Expiration 10/12/2022 10/26/2022,2359   Final   Extend sample reason 10/12/2022    Final                   Value:NO TRANSFUSIONS OR PREGNANCY IN THE PAST 3 MONTHS Performed at [facility], [address] [place], [place] [address]    MRSA, PCR 10/12/2022 NEGATIVE  NEGATIVE Final   Staphylococcus aureus 10/12/2022 NEGATIVE  NEGATIVE Final   Comment: (NOTE) The Xpert SA Assay (FDA approved for NASAL specimens in patients 65 years of age and older), is one component of a  comprehensive surveillance program. It is not intended to diagnose infection nor to guide or monitor treatment. Performed at  [facility], [address] [place], [place] [address]     ECG: Date: 10/12/2022 Time ECG obtained: 1117 AM Rate: 57 bpm Rhythm:  Sinus bradycardia, LBBB Axis (leads I and aVF): Left axis deviation Intervals: PR 206 ms. QRS 144 ms. QTc 457 ms. ST segment and T wave changes: Inferior T wave abnormality.  Evidence of an age undetermined anterior infarct  Comparison: Previous tracing obtained on 09/23/2021 showed sinus rhythm with first-degree AV block at a rate of 63 bpm.  LBBB present. NOTE: Preoperative ECG reviewed with cardiology; no comments regarding changes.  Patient cleared for surgery.   IMAGING / PROCEDURES: TRANSTHORACIC ECHOCARDIOGRAM performed on 03/02/2022 Normal left ventricular systolic function with an EF of >55% Mild concentric LVH Left ventricular diastolic Doppler parameters consistent with abnormal relaxation (G1DD). Normal right ventricular systolic function Right atrium mildly enlarged Mild MR, TR, PR; no AR Normal transvalvular gradients; no valvular stenosis No pericardial effusion  MYOCARDIAL PERFUSION IMAGING STUDY (LEXISCAN) performed on 08/05/2019 Mildly reduced left ventricular systolic function with an EF of 45% Normal myocardial thickening and wall motion  Left ventricular cavity size normal SPECT images demonstrate a small perfusion abnormality of mild intensity present in the inferior region on stress images consistent with soft tissue attenuation versus scar. Exercise tolerance good  LEFT HEART CATHETERIZATION AND CORONARY ANGIOGRAPHY performed on 11/14/2017 Normal left ventricular systolic function with an EF of >55% Normal LVEDP Multivessel CAD 30% ostial D1-D1 40% proximal LCx 40% mid LAD Previously placed stent to the mid LAD widely patent with no evidence of significant in-stent  restenosis Recommendation - medical management  Impression and Plan:  SILVERIO HAGAN has been referred for pre-anesthesia review and clearance prior to him undergoing the planned anesthetic and procedural courses. Available labs, pertinent testing, and imaging results were personally reviewed by me. This patient has been appropriately cleared by cardiology with an overall LOW risk of significant perioperative cardiovascular complications.  Based on clinical review performed today (10/21/22), barring any significant acute changes in the patient's overall condition, it is anticipated that he will be able to proceed with the planned surgical intervention. Any acute changes in clinical condition may necessitate his procedure being postponed and/or cancelled. Patient will meet with anesthesia team (MD and/or CRNA) on the day of his procedure for preoperative evaluation/assessment. Questions regarding anesthetic course will be fielded at that time.   Pre-surgical instructions were reviewed with the patient during his PAT appointment and questions were fielded by PAT clinical staff. Patient was advised that if any questions or concerns arise prior to his procedure then he should return a call to PAT and/or his surgeon's office to discuss.  Honor Loh, MSN, APRN, FNP-C, CEN [facility]  Peri-operative Services Nurse Practitioner Phone: [phone] Fax: [phone] 10/21/22 8:44 AM  NOTE: This note has been prepared using Dragon dictation software. Despite my best ability to proofread, there is always the potential that unintentional transcriptional errors may still occur from this process.

## 2022-10-23 ENCOUNTER — Encounter: Payer: Self-pay | Admitting: Orthopedic Surgery

## 2022-10-23 NOTE — H&P (Signed)
ORTHOPAEDIC HISTORY & PHYSICAL Gwenlyn Fudge, [place] - 10/11/2022 3:45 PM EDT Formatting of this note is different from the original. [place] Chief Complaint:  Chief Complaint Patient presents with Knee Pain H & P LEFT KNEE  History of Present Illness:  Nathaniel Ibarra is a 81 y.o. male that presents to clinic today for his preoperative history and evaluation. Patient presents with his wife. The patient is scheduled to undergo a left total knee arthroplasty on 10/24/22 by Dr. Marry Guan. His pain began many years ago. The pain is located primarily along the medial aspect of the knee. He describes his pain as worse with weightbearing. He reports associated swelling with some giving way of the knee. He denies associated numbness or tingling, denies locking.  The patient's symptoms have progressed to the point that they decrease his quality of life. The patient has previously undergone conservative treatment including NSAIDS and injections to the knee without adequate control of his symptoms.  Denies history of lumbar surgery, DVT. Has been followed by Dr. Ubaldo Glassing for A-fib and is status post PCI of the LAD in 2016, subsequent cardiac cath 2018 was widely patent per Dr. Bethanne Ginger note.  Patient will have his wife to help post-operatively. Patient does have history of urinary incontinence and is concerned about soiling the bandage post-operatively.  Past Medical, Surgical, Family, Social History, Allergies, Medications:  Past Medical History: Past Medical History: Diagnosis Date Arrhythmia a fib Cataract Both eyes COVID-19 12/2020 Thyroid disease  Past Surgical History: Past Surgical History: Procedure Laterality Date heart stint  Current Medications: Current Outpatient Medications Medication Sig Dispense Refill albuterol 90 mcg/actuation inhaler Inhale 2 inhalations into the lungs every 6 (six) hours as needed 18 g 0 AMIOdarone  (PACERONE) 200 MG tablet TAKE 1 TABLET BY MOUTH EVERY DAY 90 tablet 3 aspirin 81 MG EC tablet Take 1 tablet (81 mg total) by mouth once daily 30 tablet 11 finasteride (PROSCAR) 5 mg tablet Take 1 tablet by mouth once daily levothyroxine (SYNTHROID, LEVOTHROID) 100 MCG tablet Take 1 tablet by mouth once daily 2 PARoxetine (PAXIL) 20 MG tablet Take 20 mg by mouth once daily 30 tablet 11 spironolactone (ALDACTONE) 25 MG tablet TAKE 1/2 TABLET BY MOUTH ONCE DAILY 45 tablet 1 tamsulosin (FLOMAX) 0.4 mg capsule Take 2 capsules by mouth at bedtime traZODone (DESYREL) 50 MG tablet TAKE 1 TABLET BY MOUTH EVERY DAY 90 tablet 1  No current facility-administered medications for this visit.  Allergies: No Known Allergies  Social History: Social History  Socioeconomic History Marital status: Married Spouse name: [name] Number of children: 2 Years of education: 12 Highest education level: High school graduate Occupational History Occupation: Retired- [employer] Tobacco Use Smoking status: Never Smokeless tobacco: Never Tobacco comments: chews on cigars Vaping Use Vaping Use: Never used Substance and Sexual Activity Alcohol use: Never Drug use: Never Sexual activity: Defer Partners: Female  Family History: History reviewed. No pertinent family history.  Review of Systems:  A 10+ ROS was performed, reviewed, and the pertinent orthopaedic findings are documented in the HPI.  Physical Examination:  BP 120/60 (BP Location: Left upper arm, Patient Position: Sitting, BP Cuff Size: Adult)  Ht 182.9 cm (6')  Wt 89 kg (196 lb 3.2 oz)  BMI 26.61 kg/m  Patient is a well-developed, well-nourished male in no acute distress. Patient has normal mood and affect. Patient is alert and oriented to person, place, and time.  HEENT: Atraumatic, normocephalic. Pupils equal and reactive to  light. Extraocular motion intact. Noninjected sclera.  Cardiovascular: Regular rate and rhythm, with  no murmurs, rubs, or gallops. Distal pulses palpable. No carotid bruits.  Respiratory: Lungs clear to auscultation bilaterally.  Left Knee: Soft tissue swelling: mild Effusion: minimal Erythema: none Crepitance: mild Tenderness: medial Alignment: relative varus Mediolateral laxity: medial pseudolaxity Posterior sag: negative Patellar tracking: Good tracking without evidence of subluxation or tilt Atrophy: No significant atrophy. Quadriceps tone was good. Range of motion: 0/15/125 degrees  Patient able to actively plantarflex and dorsiflex the left ankle. Able to flex and extend the toes.  Sensation intact over the saphenous, lateral sural cutaneous, superficial fibular, and deep fibular nerve distributions.  Tests Performed/Reviewed: X-rays  X-ray knee left 3 views  3 views of the left knee were reviewed. Images reveal severe loss of medial compartment joint space with osteophyte formation and bone-on-bone contact. No fractures or dislocations. No other osseous abnormality noted  Impression:  ICD-10-CM 1. Primary osteoarthritis of left knee M17.12  Plan:  The patient has end-stage degenerative changes of the left knee. It was explained to the patient that the condition is progressive in nature. Having failed conservative treatment, the patient has elected to proceed with a total joint arthroplasty. The patient will undergo a total joint arthroplasty with Dr. Marry Guan. The risks of surgery, including blood clot and infection, were discussed with the patient. Measures to reduce these risks, including the use of anticoagulation, perioperative antibiotics, and early ambulation were discussed. The importance of postoperative physical therapy was discussed with the patient. The patient elects to proceed with surgery. The patient is instructed to stop all blood thinners prior to surgery. The patient is instructed to call the hospital the day before surgery to learn of the proper arrival  time.  Contact our office with any questions or concerns. Follow up as indicated, or sooner should any new problems arise, if conditions worsen, or if they are otherwise concerned.  Gwenlyn Fudge, [facility] [place], [place] [address] Phone: [phone]  This note was generated in part with voice recognition software and I apologize for any typographical errors that were not detected and corrected.  Electronically signed by Gwenlyn Fudge, PA at 10/18/2022 5:41 PM EDT

## 2022-10-24 ENCOUNTER — Ambulatory Visit: Payer: PPO | Admitting: Urgent Care

## 2022-10-24 ENCOUNTER — Encounter
Admission: RE | Disposition: A | Discharge status: Home Health | Payer: Self-pay | Source: Home / Self Care | Attending: Orthopedic Surgery

## 2022-10-24 ENCOUNTER — Observation Stay: Payer: PPO

## 2022-10-24 ENCOUNTER — Observation Stay
Admission: RE | Admit: 2022-10-24 | Discharge: 2022-10-25 | Disposition: A | Discharge status: Home Health | Payer: PPO | Attending: Orthopedic Surgery | Admitting: Orthopedic Surgery

## 2022-10-24 ENCOUNTER — Other Ambulatory Visit: Payer: Self-pay

## 2022-10-24 ENCOUNTER — Encounter: Payer: Self-pay | Admitting: Orthopedic Surgery

## 2022-10-24 DIAGNOSIS — Z85828 Personal history of other malignant neoplasm of skin: Secondary | ICD-10-CM | POA: Insufficient documentation

## 2022-10-24 DIAGNOSIS — R001 Bradycardia, unspecified: Secondary | ICD-10-CM

## 2022-10-24 DIAGNOSIS — I5043 Acute on chronic combined systolic (congestive) and diastolic (congestive) heart failure: Secondary | ICD-10-CM | POA: Insufficient documentation

## 2022-10-24 DIAGNOSIS — M1712 Unilateral primary osteoarthritis, left knee: Secondary | ICD-10-CM | POA: Diagnosis not present

## 2022-10-24 DIAGNOSIS — I2510 Atherosclerotic heart disease of native coronary artery without angina pectoris: Secondary | ICD-10-CM | POA: Diagnosis not present

## 2022-10-24 DIAGNOSIS — I5022 Chronic systolic (congestive) heart failure: Secondary | ICD-10-CM

## 2022-10-24 DIAGNOSIS — Z01812 Encounter for preprocedural laboratory examination: Secondary | ICD-10-CM

## 2022-10-24 DIAGNOSIS — Z96652 Presence of left artificial knee joint: Secondary | ICD-10-CM

## 2022-10-24 DIAGNOSIS — Z79899 Other long term (current) drug therapy: Secondary | ICD-10-CM | POA: Insufficient documentation

## 2022-10-24 DIAGNOSIS — E039 Hypothyroidism, unspecified: Secondary | ICD-10-CM

## 2022-10-24 DIAGNOSIS — Z8616 Personal history of COVID-19: Secondary | ICD-10-CM | POA: Insufficient documentation

## 2022-10-24 DIAGNOSIS — I5023 Acute on chronic systolic (congestive) heart failure: Secondary | ICD-10-CM

## 2022-10-24 DIAGNOSIS — K219 Gastro-esophageal reflux disease without esophagitis: Secondary | ICD-10-CM

## 2022-10-24 DIAGNOSIS — Z7982 Long term (current) use of aspirin: Secondary | ICD-10-CM | POA: Diagnosis not present

## 2022-10-24 DIAGNOSIS — I480 Paroxysmal atrial fibrillation: Secondary | ICD-10-CM | POA: Insufficient documentation

## 2022-10-24 DIAGNOSIS — I501 Left ventricular failure: Secondary | ICD-10-CM

## 2022-10-24 DIAGNOSIS — Z471 Aftercare following joint replacement surgery: Secondary | ICD-10-CM | POA: Diagnosis not present

## 2022-10-24 DIAGNOSIS — I249 Acute ischemic heart disease, unspecified: Secondary | ICD-10-CM

## 2022-10-24 HISTORY — DX: Sleep disorder, unspecified: G47.9

## 2022-10-24 HISTORY — DX: Edema, unspecified: R60.9

## 2022-10-24 HISTORY — PX: KNEE ARTHROPLASTY: SHX992

## 2022-10-24 HISTORY — DX: Depression, unspecified: F32.A

## 2022-10-24 HISTORY — DX: Atherosclerotic heart disease of native coronary artery without angina pectoris: I25.10

## 2022-10-24 HISTORY — DX: Unspecified osteoarthritis, unspecified site: M19.90

## 2022-10-24 HISTORY — DX: Cardiomyopathy, unspecified: I42.9

## 2022-10-24 HISTORY — DX: Atherosclerosis of aorta: I70.0

## 2022-10-24 HISTORY — DX: Unspecified systolic (congestive) heart failure: I50.20

## 2022-10-24 HISTORY — DX: Personal history of COVID-19: Z86.16

## 2022-10-24 HISTORY — DX: Unspecified cataract: H26.9

## 2022-10-24 HISTORY — DX: Hyperlipidemia, unspecified: E78.5

## 2022-10-24 HISTORY — DX: Other long term (current) drug therapy: Z79.899

## 2022-10-24 HISTORY — DX: Left bundle-branch block, unspecified: I44.7

## 2022-10-24 HISTORY — DX: Paroxysmal atrial fibrillation: I48.0

## 2022-10-24 HISTORY — DX: Bradycardia, unspecified: R00.1

## 2022-10-24 HISTORY — DX: Localized edema: R60.0

## 2022-10-24 LAB — ABO/RH: ABO/RH(D): O POS

## 2022-10-24 SURGERY — ARTHROPLASTY, KNEE, TOTAL, USING IMAGELESS COMPUTER-ASSISTED NAVIGATION
Anesthesia: Spinal | Site: Knee | Laterality: Left

## 2022-10-24 MED ORDER — TRAMADOL HCL 50 MG PO TABS: 50.0000 mg | ORAL_TABLET | ORAL | Status: DC | PRN

## 2022-10-24 MED ORDER — ACETAMINOPHEN 325 MG PO TABS: 325.0000 mg | ORAL_TABLET | Freq: Four times a day (QID) | ORAL | Status: DC | PRN

## 2022-10-24 MED ORDER — ENOXAPARIN SODIUM 30 MG/0.3ML IJ SOSY
30.0000 mg | PREFILLED_SYRINGE | Freq: Two times a day (BID) | INTRAMUSCULAR | Status: DC
  Administered 2022-10-25: 30 mg via SUBCUTANEOUS
  Filled 2022-10-24: qty 0.3

## 2022-10-24 MED ORDER — SPIRONOLACTONE 12.5 MG HALF TABLET
12.5000 mg | ORAL_TABLET | Freq: Every day | ORAL | Status: DC
  Administered 2022-10-24 – 2022-10-25 (×2): 12.5 mg via ORAL
  Filled 2022-10-24 (×2): qty 1

## 2022-10-24 MED ORDER — PROPOFOL 10 MG/ML IV BOLUS
INTRAVENOUS | Status: AC
  Filled 2022-10-24: qty 20

## 2022-10-24 MED ORDER — ACETAMINOPHEN 10 MG/ML IV SOLN: 1000.0000 mg | Freq: Once | INTRAVENOUS | Status: DC | PRN

## 2022-10-24 MED ORDER — PROPOFOL 1000 MG/100ML IV EMUL
INTRAVENOUS | Status: AC
  Filled 2022-10-24: qty 100

## 2022-10-24 MED ORDER — ALUM & MAG HYDROXIDE-SIMETH 200-200-20 MG/5ML PO SUSP: 30.0000 mL | ORAL | Status: DC | PRN

## 2022-10-24 MED ORDER — METOCLOPRAMIDE HCL 5 MG PO TABS
10.0000 mg | ORAL_TABLET | Freq: Three times a day (TID) | ORAL | Status: DC
  Administered 2022-10-24 – 2022-10-25 (×3): 10 mg via ORAL
  Filled 2022-10-24 (×3): qty 2

## 2022-10-24 MED ORDER — PHENYLEPHRINE HCL-NACL 20-0.9 MG/250ML-% IV SOLN
INTRAVENOUS | Status: DC | PRN
  Administered 2022-10-24: 40 ug/min via INTRAVENOUS

## 2022-10-24 MED ORDER — CEFAZOLIN SODIUM-DEXTROSE 2-4 GM/100ML-% IV SOLN
2.0000 g | Freq: Four times a day (QID) | INTRAVENOUS | Status: AC
  Administered 2022-10-24: 2 g via INTRAVENOUS
  Filled 2022-10-24 (×2): qty 100

## 2022-10-24 MED ORDER — BISACODYL 10 MG RE SUPP
10.0000 mg | Freq: Every day | RECTAL | Status: DC | PRN
  Administered 2022-10-25: 10 mg via RECTAL
  Filled 2022-10-24: qty 1

## 2022-10-24 MED ORDER — BUPIVACAINE HCL (PF) 0.5 % IJ SOLN
INTRAMUSCULAR | Status: AC
  Filled 2022-10-24: qty 40

## 2022-10-24 MED ORDER — LEVOTHYROXINE SODIUM 50 MCG PO TABS
100.0000 ug | ORAL_TABLET | Freq: Every day | ORAL | Status: DC
  Administered 2022-10-25: 100 ug via ORAL
  Filled 2022-10-24: qty 2

## 2022-10-24 MED ORDER — OXYCODONE HCL 5 MG/5ML PO SOLN: 5.0000 mg | Freq: Once | ORAL | Status: DC | PRN

## 2022-10-24 MED ORDER — TRANEXAMIC ACID-NACL 1000-0.7 MG/100ML-% IV SOLN
INTRAVENOUS | Status: AC
  Filled 2022-10-24: qty 100

## 2022-10-24 MED ORDER — ACETAMINOPHEN 10 MG/ML IV SOLN
INTRAVENOUS | Status: AC
  Filled 2022-10-24: qty 100

## 2022-10-24 MED ORDER — ONDANSETRON HCL 4 MG/2ML IJ SOLN: 4.0000 mg | Freq: Once | INTRAMUSCULAR | Status: DC | PRN

## 2022-10-24 MED ORDER — LIDOCAINE HCL (PF) 2 % IJ SOLN
INTRAMUSCULAR | Status: AC
  Filled 2022-10-24: qty 5

## 2022-10-24 MED ORDER — CELECOXIB 200 MG PO CAPS
200.0000 mg | ORAL_CAPSULE | Freq: Two times a day (BID) | ORAL | Status: DC
  Administered 2022-10-24 – 2022-10-25 (×2): 200 mg via ORAL
  Filled 2022-10-24 (×3): qty 1

## 2022-10-24 MED ORDER — MAGNESIUM HYDROXIDE 400 MG/5ML PO SUSP
30.0000 mL | Freq: Every day | ORAL | Status: DC
  Administered 2022-10-24 – 2022-10-25 (×2): 30 mL via ORAL
  Filled 2022-10-24 (×2): qty 30

## 2022-10-24 MED ORDER — ONDANSETRON HCL 4 MG/2ML IJ SOLN: 4.0000 mg | Freq: Four times a day (QID) | INTRAMUSCULAR | Status: DC | PRN

## 2022-10-24 MED ORDER — DEXAMETHASONE SODIUM PHOSPHATE 10 MG/ML IJ SOLN: 8.0000 mg | Freq: Once | INTRAMUSCULAR | Status: AC

## 2022-10-24 MED ORDER — ACETAMINOPHEN 10 MG/ML IV SOLN
INTRAVENOUS | Status: DC | PRN
  Administered 2022-10-24: 1000 mg via INTRAVENOUS

## 2022-10-24 MED ORDER — PHENYLEPHRINE 80 MCG/ML (10ML) SYRINGE FOR IV PUSH (FOR BLOOD PRESSURE SUPPORT)
PREFILLED_SYRINGE | INTRAVENOUS | Status: AC
  Filled 2022-10-24: qty 10

## 2022-10-24 MED ORDER — ONDANSETRON HCL 4 MG PO TABS: 4.0000 mg | ORAL_TABLET | Freq: Four times a day (QID) | ORAL | Status: DC | PRN

## 2022-10-24 MED ORDER — OXYCODONE HCL 5 MG PO TABS: 5.0000 mg | ORAL_TABLET | ORAL | Status: DC | PRN

## 2022-10-24 MED ORDER — PHENYLEPHRINE HCL-NACL 20-0.9 MG/250ML-% IV SOLN
INTRAVENOUS | Status: AC
  Filled 2022-10-24: qty 250

## 2022-10-24 MED ORDER — PROPOFOL 500 MG/50ML IV EMUL
INTRAVENOUS | Status: DC | PRN
  Administered 2022-10-24: 100 ug/kg/min via INTRAVENOUS

## 2022-10-24 MED ORDER — FERROUS SULFATE 325 (65 FE) MG PO TABS
325.0000 mg | ORAL_TABLET | Freq: Two times a day (BID) | ORAL | Status: DC
  Administered 2022-10-24 – 2022-10-25 (×2): 325 mg via ORAL
  Filled 2022-10-24 (×2): qty 1

## 2022-10-24 MED ORDER — SODIUM CHLORIDE 0.9 % IV SOLN: INTRAVENOUS | Status: DC

## 2022-10-24 MED ORDER — TAMSULOSIN HCL 0.4 MG PO CAPS
0.8000 mg | ORAL_CAPSULE | Freq: Every day | ORAL | Status: DC
  Administered 2022-10-24: 0.8 mg via ORAL
  Filled 2022-10-24: qty 2

## 2022-10-24 MED ORDER — FAMOTIDINE 20 MG PO TABS
ORAL_TABLET | ORAL | Status: AC
  Administered 2022-10-24: 20 mg via ORAL
  Filled 2022-10-24: qty 1

## 2022-10-24 MED ORDER — TRANEXAMIC ACID-NACL 1000-0.7 MG/100ML-% IV SOLN
1000.0000 mg | Freq: Once | INTRAVENOUS | Status: AC
  Administered 2022-10-24: 1000 mg via INTRAVENOUS

## 2022-10-24 MED ORDER — TRAZODONE HCL 50 MG PO TABS
50.0000 mg | ORAL_TABLET | Freq: Every day | ORAL | Status: DC
  Administered 2022-10-24: 50 mg via ORAL
  Filled 2022-10-24: qty 1

## 2022-10-24 MED ORDER — GABAPENTIN 300 MG PO CAPS
ORAL_CAPSULE | ORAL | Status: AC
  Administered 2022-10-24: 300 mg via ORAL
  Filled 2022-10-24: qty 1

## 2022-10-24 MED ORDER — SODIUM CHLORIDE 0.9 % IR SOLN
Status: DC | PRN
  Administered 2022-10-24: 3000 mL

## 2022-10-24 MED ORDER — PHENOL 1.4 % MT LIQD: 1.0000 | OROMUCOSAL | Status: DC | PRN

## 2022-10-24 MED ORDER — OXYCODONE HCL 5 MG PO TABS: 5.0000 mg | ORAL_TABLET | Freq: Once | ORAL | Status: DC | PRN

## 2022-10-24 MED ORDER — CEFAZOLIN SODIUM-DEXTROSE 2-4 GM/100ML-% IV SOLN
INTRAVENOUS | Status: AC
  Administered 2022-10-24: 2 g via INTRAVENOUS
  Filled 2022-10-24: qty 100

## 2022-10-24 MED ORDER — PAROXETINE HCL 20 MG PO TABS
20.0000 mg | ORAL_TABLET | Freq: Every day | ORAL | Status: DC
  Administered 2022-10-25: 20 mg via ORAL
  Filled 2022-10-24: qty 1

## 2022-10-24 MED ORDER — FLEET ENEMA 7-19 GM/118ML RE ENEM: 1.0000 | ENEMA | Freq: Once | RECTAL | Status: DC | PRN

## 2022-10-24 MED ORDER — AMIODARONE HCL 200 MG PO TABS
200.0000 mg | ORAL_TABLET | Freq: Every day | ORAL | Status: DC
  Administered 2022-10-25: 200 mg via ORAL
  Filled 2022-10-24: qty 1

## 2022-10-24 MED ORDER — PROPOFOL 10 MG/ML IV BOLUS
INTRAVENOUS | Status: DC | PRN
  Administered 2022-10-24: 20 mg via INTRAVENOUS

## 2022-10-24 MED ORDER — GABAPENTIN 300 MG PO CAPS: 300.0000 mg | ORAL_CAPSULE | Freq: Once | ORAL | Status: AC

## 2022-10-24 MED ORDER — PANTOPRAZOLE SODIUM 40 MG PO TBEC
40.0000 mg | DELAYED_RELEASE_TABLET | Freq: Two times a day (BID) | ORAL | Status: DC
  Administered 2022-10-24 – 2022-10-25 (×3): 40 mg via ORAL
  Filled 2022-10-24 (×3): qty 1

## 2022-10-24 MED ORDER — TRANEXAMIC ACID-NACL 1000-0.7 MG/100ML-% IV SOLN
1000.0000 mg | INTRAVENOUS | Status: AC
Start: 2022-10-24 — End: 2022-10-24
  Administered 2022-10-24: 600 mg via INTRAVENOUS
  Administered 2022-10-24: 1000 mg via INTRAVENOUS

## 2022-10-24 MED ORDER — MENTHOL 3 MG MT LOZG: 1.0000 | LOZENGE | OROMUCOSAL | Status: DC | PRN

## 2022-10-24 MED ORDER — FENTANYL CITRATE (PF) 100 MCG/2ML IJ SOLN: 25.0000 ug | INTRAMUSCULAR | Status: DC | PRN

## 2022-10-24 MED ORDER — SURGIPHOR WOUND IRRIGATION SYSTEM - OPTIME
TOPICAL | Status: DC | PRN
  Administered 2022-10-24: 450 mL via TOPICAL

## 2022-10-24 MED ORDER — CELECOXIB 200 MG PO CAPS
ORAL_CAPSULE | ORAL | Status: AC
  Administered 2022-10-24: 400 mg via ORAL
  Filled 2022-10-24: qty 2

## 2022-10-24 MED ORDER — CHLORHEXIDINE GLUCONATE 0.12 % MT SOLN
OROMUCOSAL | Status: AC
  Administered 2022-10-24: 15 mL via OROMUCOSAL
  Filled 2022-10-24: qty 15

## 2022-10-24 MED ORDER — ONDANSETRON HCL 4 MG/2ML IJ SOLN
INTRAMUSCULAR | Status: AC
  Filled 2022-10-24: qty 2

## 2022-10-24 MED ORDER — 0.9 % SODIUM CHLORIDE (POUR BTL) OPTIME
TOPICAL | Status: DC | PRN
  Administered 2022-10-24: 500 mL

## 2022-10-24 MED ORDER — BUPIVACAINE HCL (PF) 0.5 % IJ SOLN
INTRAMUSCULAR | Status: DC | PRN
  Administered 2022-10-24: 3 mL

## 2022-10-24 MED ORDER — CEFAZOLIN SODIUM-DEXTROSE 2-4 GM/100ML-% IV SOLN
2.0000 g | INTRAVENOUS | Status: AC
  Administered 2022-10-24: 2 g via INTRAVENOUS

## 2022-10-24 MED ORDER — HYDROMORPHONE HCL 1 MG/ML IJ SOLN: 0.5000 mg | INTRAMUSCULAR | Status: DC | PRN

## 2022-10-24 MED ORDER — SENNOSIDES-DOCUSATE SODIUM 8.6-50 MG PO TABS
1.0000 | ORAL_TABLET | Freq: Two times a day (BID) | ORAL | Status: DC
  Administered 2022-10-24 – 2022-10-25 (×3): 1 via ORAL
  Filled 2022-10-24 (×3): qty 1

## 2022-10-24 MED ORDER — BUPIVACAINE HCL (PF) 0.25 % IJ SOLN
INTRAMUSCULAR | Status: DC | PRN
  Administered 2022-10-24: 60 mL

## 2022-10-24 MED ORDER — SODIUM CHLORIDE 0.9 % IV SOLN
INTRAVENOUS | Status: DC | PRN
  Administered 2022-10-24: 60 mL

## 2022-10-24 MED ORDER — ACETAMINOPHEN 10 MG/ML IV SOLN
1000.0000 mg | Freq: Four times a day (QID) | INTRAVENOUS | Status: AC
  Administered 2022-10-24 – 2022-10-25 (×3): 1000 mg via INTRAVENOUS
  Filled 2022-10-24 (×4): qty 100

## 2022-10-24 MED ORDER — OXYCODONE HCL 5 MG PO TABS: 10.0000 mg | ORAL_TABLET | ORAL | Status: DC | PRN

## 2022-10-24 MED ORDER — EPHEDRINE 5 MG/ML INJ
INTRAVENOUS | Status: AC
  Filled 2022-10-24: qty 15

## 2022-10-24 MED ORDER — DIPHENHYDRAMINE HCL 12.5 MG/5ML PO ELIX: 12.5000 mg | ORAL_SOLUTION | ORAL | Status: DC | PRN

## 2022-10-24 MED ORDER — FINASTERIDE 5 MG PO TABS
5.0000 mg | ORAL_TABLET | Freq: Every day | ORAL | Status: DC
  Administered 2022-10-24 – 2022-10-25 (×2): 5 mg via ORAL
  Filled 2022-10-24 (×2): qty 1

## 2022-10-24 MED ORDER — ONDANSETRON HCL 4 MG/2ML IJ SOLN
INTRAMUSCULAR | Status: DC | PRN
  Administered 2022-10-24: 4 mg via INTRAVENOUS

## 2022-10-24 MED ORDER — ALBUTEROL SULFATE (2.5 MG/3ML) 0.083% IN NEBU: 2.5000 mL | INHALATION_SOLUTION | RESPIRATORY_TRACT | Status: DC | PRN

## 2022-10-24 MED ORDER — LACTATED RINGERS IV SOLN: INTRAVENOUS | Status: DC

## 2022-10-24 MED ORDER — ENSURE PRE-SURGERY PO LIQD
296.0000 mL | Freq: Once | ORAL | Status: AC
  Administered 2022-10-24: 296 mL via ORAL
  Filled 2022-10-24: qty 296

## 2022-10-24 MED ORDER — CELECOXIB 200 MG PO CAPS: 400.0000 mg | ORAL_CAPSULE | Freq: Once | ORAL | Status: AC

## 2022-10-24 MED ORDER — FAMOTIDINE 20 MG PO TABS: 20.0000 mg | ORAL_TABLET | Freq: Once | ORAL | Status: AC

## 2022-10-24 MED ORDER — ORAL CARE MOUTH RINSE: 15.0000 mL | Freq: Once | OROMUCOSAL | Status: AC

## 2022-10-24 MED ORDER — DEXAMETHASONE SODIUM PHOSPHATE 10 MG/ML IJ SOLN
INTRAMUSCULAR | Status: AC
  Administered 2022-10-24: 8 mg via INTRAVENOUS
  Filled 2022-10-24: qty 1

## 2022-10-24 MED ORDER — CHLORHEXIDINE GLUCONATE 0.12 % MT SOLN: 15.0000 mL | Freq: Once | OROMUCOSAL | Status: AC

## 2022-10-24 SURGICAL SUPPLY — 76 items
ATTUNE MED DOME PAT 38 KNEE (Knees) IMPLANT
ATTUNE PS FEM LT SZ 6 CEM KNEE (Femur) IMPLANT
ATTUNE PSRP INSR SZ6 5 KNEE (Insert) IMPLANT
BASE TIBIAL ROT PLAT SZ 7 KNEE (Knees) IMPLANT
BATTERY INSTRU NAVIGATION (MISCELLANEOUS) ×4 IMPLANT
BLADE SAW 70X12.5 (BLADE) ×1 IMPLANT
BLADE SAW 90X13X1.19 OSCILLAT (BLADE) ×1 IMPLANT
BLADE SAW 90X25X1.19 OSCILLAT (BLADE) ×1 IMPLANT
BONE CEMENT GENTAMICIN (Cement) ×2 IMPLANT
BSPLAT TIB 7 CMNT ROT PLAT STR (Knees) ×1 IMPLANT
BTRY SRG DRVR LF (MISCELLANEOUS) ×4
CEMENT BONE GENTAMICIN 40 (Cement) IMPLANT
CEMENT HV SMART SET (Cement) IMPLANT
COOLER POLAR GLACIER W/PUMP (MISCELLANEOUS) ×1 IMPLANT
CUFF TOURN SGL QUICK 24 (TOURNIQUET CUFF)
CUFF TOURN SGL QUICK 34 (TOURNIQUET CUFF)
CUFF TRNQT CYL 24X4X16.5-23 (TOURNIQUET CUFF) IMPLANT
CUFF TRNQT CYL 34X4.125X (TOURNIQUET CUFF) IMPLANT
DRAPE 3/4 80X56 (DRAPES) ×1 IMPLANT
DRAPE INCISE IOBAN 66X45 STRL (DRAPES) IMPLANT
DRSG DERMACEA 8X12 NADH (GAUZE/BANDAGES/DRESSINGS) IMPLANT
DRSG DERMACEA NONADH 3X8 (GAUZE/BANDAGES/DRESSINGS) ×1 IMPLANT
DRSG MEPILEX SACRM 8.7X9.8 (GAUZE/BANDAGES/DRESSINGS) ×1 IMPLANT
DRSG OPSITE POSTOP 4X14 (GAUZE/BANDAGES/DRESSINGS) ×1 IMPLANT
DRSG TEGADERM 4X4.75 (GAUZE/BANDAGES/DRESSINGS) ×1 IMPLANT
DURAPREP 26ML APPLICATOR (WOUND CARE) ×2 IMPLANT
ELECT CAUTERY BLADE 6.4 (BLADE) ×1 IMPLANT
ELECT REM PT RETURN 9FT ADLT (ELECTROSURGICAL) ×1 IMPLANT
ELECTRODE REM PT RTRN 9FT ADLT (ELECTROSURGICAL) ×1 IMPLANT
EX-PIN ORTHOLOCK NAV 4X150 (PIN) ×2 IMPLANT
GLOVE BIOGEL M STRL SZ7.5 (GLOVE) ×2 IMPLANT
GLOVE BIOGEL PI IND STRL 7.5 (GLOVE) ×1 IMPLANT
GLOVE PI ORTHO PRO STRL 7.5 (GLOVE) ×2 IMPLANT
GLOVE SURG UNDER POLY LF SZ7.5 (GLOVE) ×1 IMPLANT
GOWN STRL REUS W/ TWL LRG LVL3 (GOWN DISPOSABLE) ×2 IMPLANT
GOWN STRL REUS W/ TWL XL LVL3 (GOWN DISPOSABLE) ×1 IMPLANT
GOWN STRL REUS W/TWL LRG LVL3 (GOWN DISPOSABLE) ×2
GOWN STRL REUS W/TWL XL LVL3 (GOWN DISPOSABLE) ×1
HEMOVAC 400CC 10FR (MISCELLANEOUS) ×1 IMPLANT
HOLDER FOLEY CATH W/STRAP (MISCELLANEOUS) ×1 IMPLANT
HOOD PEEL AWAY T7 (MISCELLANEOUS) ×2 IMPLANT
IV NS IRRIG 3000ML ARTHROMATIC (IV SOLUTION) ×1 IMPLANT
KIT TURNOVER KIT A (KITS) ×1 IMPLANT
KNIFE SCULPS 14X20 (INSTRUMENTS) ×1 IMPLANT
MANIFOLD NEPTUNE II (INSTRUMENTS) ×2 IMPLANT
NDL SPNL 20GX3.5 QUINCKE YW (NEEDLE) ×2 IMPLANT
NEEDLE SPNL 20GX3.5 QUINCKE YW (NEEDLE) ×2 IMPLANT
NS IRRIG 500ML POUR BTL (IV SOLUTION) ×1 IMPLANT
PACK TOTAL KNEE (MISCELLANEOUS) ×1 IMPLANT
PAD ABD DERMACEA PRESS 5X9 (GAUZE/BANDAGES/DRESSINGS) ×2 IMPLANT
PAD WRAPON POLAR KNEE (MISCELLANEOUS) ×1 IMPLANT
PIN DRILL FIX HALF THREAD (BIT) ×2 IMPLANT
PIN DRILL QUICK PACK ×2 IMPLANT
PIN FIXATION 1/8DIA X 3INL (PIN) ×1 IMPLANT
PULSAVAC PLUS IRRIG FAN TIP (DISPOSABLE) ×1 IMPLANT
SOL PREP PVP 2OZ (MISCELLANEOUS) ×1 IMPLANT
SOLUTION IRRIG SURGIPHOR (IV SOLUTION) ×1 IMPLANT
SOLUTION PREP PVP 2OZ (MISCELLANEOUS) ×1 IMPLANT
SPONGE DRAIN TRACH 4X4 STRL 2S (GAUZE/BANDAGES/DRESSINGS) ×1 IMPLANT
STAPLER SKIN PROX 35W (STAPLE) ×1 IMPLANT
STOCKINETTE IMPERV 14X48 (MISCELLANEOUS) IMPLANT
STRAP TIBIA SHORT (MISCELLANEOUS) ×1 IMPLANT
SUCTION FRAZIER HANDLE 10FR (MISCELLANEOUS) ×1
SUCTION TUBE FRAZIER 10FR DISP (MISCELLANEOUS) ×1 IMPLANT
SUT VIC AB 0 CT1 36 (SUTURE) ×2 IMPLANT
SUT VIC AB 1 CT1 36 (SUTURE) ×2 IMPLANT
SUT VIC AB 2-0 CT2 27 (SUTURE) ×1 IMPLANT
SYR 30ML LL (SYRINGE) ×2 IMPLANT
TIBIAL BASE ROT PLAT SZ 7 KNEE (Knees) ×1 IMPLANT
TIP FAN IRRIG PULSAVAC PLUS (DISPOSABLE) ×1 IMPLANT
TOWEL OR 17X26 4PK STRL BLUE (TOWEL DISPOSABLE) IMPLANT
TOWER CARTRIDGE SMART MIX (DISPOSABLE) ×1 IMPLANT
TRAP FLUID SMOKE EVACUATOR (MISCELLANEOUS) ×1 IMPLANT
TRAY FOLEY MTR SLVR 16FR STAT (SET/KITS/TRAYS/PACK) ×1 IMPLANT
WATER STERILE IRR 1000ML POUR (IV SOLUTION) ×1 IMPLANT
WRAPON POLAR PAD KNEE (MISCELLANEOUS) ×1 IMPLANT

## 2022-10-24 NOTE — Evaluation (Signed)
Physical Therapy Evaluation Patient Details Name: Nathaniel Ibarra MRN: [id number] DOB: [birth year] Today's Date: 10/24/2022  History of Present Illness  Pt is an 81 y.o. male s/p L TKA secondary degenerative arthrosis 10/24/22.  PMH includes: a-fib (s/p PCI of LAD 2016), cardiac cath 2018, h/o urinary incontinence, COVID-19 12/2020, thyroid disease, bladder CA, bradycardia.  Clinical Impression  Prior to surgery, pt was modified independent ambulating with SPC; lives with his wife in 1 level home with 3 STE (no railings).  L knee pain 2/10 at rest beginning of session and 5/10 at rest end of session.  Able to perform L LE SLR independently so no KI utilized.  L knee flexion AROM to 85 degrees.  Currently pt is SBA semi-supine to sitting edge of bed; min assist with transfers using RW; and CGA to ambulate a few feet bed to recliner with RW use.  Pt would benefit from skilled PT to address noted impairments and functional limitations (see below for any additional details).  Upon hospital discharge, pt would benefit from HHPT and support/assist from family.    Recommendations for follow up therapy are one component of a multi-disciplinary discharge planning process, led by the attending physician.  Recommendations may be updated based on patient status, additional functional criteria and insurance authorization.  Follow Up Recommendations Home health PT      Assistance Recommended at Discharge Frequent or constant Supervision/Assistance  Patient can return home with the following  A little help with walking and/or transfers;A little help with bathing/dressing/bathroom;Assistance with cooking/housework;Assist for transportation;Help with stairs or ramp for entrance    Equipment Recommendations None recommended by PT (pt has RW and BSC at home already)  Recommendations for Other Services  OT consult    Functional Status Assessment Patient has had a recent decline in their functional status and  demonstrates the ability to make significant improvements in function in a reasonable and predictable amount of time.     Precautions / Restrictions Precautions Precautions: Fall;Knee Precaution Booklet Issued: Yes (comment) Required Braces or Orthoses: Knee Immobilizer - Left Knee Immobilizer - Left: Discontinue once straight leg raise with < 10 degree lag Restrictions Weight Bearing Restrictions: Yes LLE Weight Bearing: Weight bearing as tolerated      Mobility  Bed Mobility Overal bed mobility: Needs Assistance Bed Mobility: Supine to Sit     Supine to sit: Supervision, HOB elevated     General bed mobility comments: increased effort to perform on own; use of bed rail; vc's for technique    Transfers Overall transfer level: Needs assistance Equipment used: Rolling walker (2 wheels) Transfers: Sit to/from Stand Sit to Stand: Min assist           General transfer comment: vc's for UE/LE placement and overall technique    Ambulation/Gait Ambulation/Gait assistance: Min guard Gait Distance (Feet): 3 Feet (bed to recliner) Assistive device: Rolling walker (2 wheels)   Gait velocity: decreased     General Gait Details: antalgic; decreased stance time L LE; vc's for walker use and overall sequencing/technique  Stairs            Wheelchair Mobility    Modified Rankin (Stroke Patients Only)       Balance Overall balance assessment: Needs assistance Sitting-balance support: No upper extremity supported, Feet supported Sitting balance-Leahy Scale: Good Sitting balance - Comments: steady sitting reaching within BOS   Standing balance support: Bilateral upper extremity supported, During functional activity Standing balance-Leahy Scale: Fair Standing balance comment: steady with B UE  support on RW                             Pertinent Vitals/Pain Pain Assessment Pain Assessment: 0-10 Pain Score: 5  Pain Location: L knee Pain Descriptors  / Indicators: Aching, Tender, Sore Pain Intervention(s): Limited activity within patient's tolerance, Monitored during session, Repositioned, Other (comment) (polar care applied) HR 55 bpm throughout PT session (nurse notified).  O2 sats 93% or greater on 2 L O2 via nasal cannula during sessions activities.    Home Living Family/patient expects to be discharged to:: Private residence Living Arrangements: Spouse/significant other Available Help at Discharge: Family;Available 24 hours/day Type of Home: House Home Access: Stairs to enter Entrance Stairs-Rails: None Entrance Stairs-Number of Steps: 3   Home Layout: One level Home Equipment: Grab bars - tub/shower;Shower seat;BSC/3in1;Rolling Environmental consultant (2 wheels);Cane - single point      Prior Function Prior Level of Function : Independent/Modified Independent             Mobility Comments: Pt has been using SPC for about the last 6 months d/t L knee pain (no AD use prior to that).       Hand Dominance        Extremity/Trunk Assessment   Upper Extremity Assessment Upper Extremity Assessment: Overall WFL for tasks assessed    Lower Extremity Assessment Lower Extremity Assessment: LLE deficits/detail (R LE WFL except pt reporting bottom of both feet feeling numb) LLE Deficits / Details: able to perform L LE SLR independently; at least 3/5 AROM hip flexion and ankle DF/PF LLE: Unable to fully assess due to pain    Cervical / Trunk Assessment Cervical / Trunk Assessment: Normal  Communication   Communication: No difficulties  Cognition Arousal/Alertness: Awake/alert Behavior During Therapy: WFL for tasks assessed/performed Overall Cognitive Status: Within Functional Limits for tasks assessed                                 General Comments: Increased time to respond at times        General Comments General comments (skin integrity, edema, etc.): L LE hemovac in place.  Nursing cleared pt for participation  in physical therapy.  Pt agreeable to PT session.  Pt's wife present during session.    Exercises Total Joint Exercises Ankle Circles/Pumps: AROM, Strengthening, Both, 10 reps, Supine Quad Sets: AROM, Strengthening, Left, 10 reps, Supine Short Arc Quad: AROM, Strengthening, Left, 10 reps, Supine Heel Slides: AAROM, Strengthening, Left, 10 reps, Supine Hip ABduction/ADduction: AAROM, Strengthening, Left, 10 reps, Supine Straight Leg Raises: AROM, Strengthening, Left, 10 reps, Supine Goniometric ROM: L knee AROM 10 degrees to 85 degrees   Assessment/Plan    PT Assessment Patient needs continued PT services  PT Problem List Decreased strength;Decreased range of motion;Decreased activity tolerance;Decreased balance;Decreased mobility;Decreased knowledge of use of DME;Decreased knowledge of precautions;Pain;Decreased skin integrity       PT Treatment Interventions DME instruction;Gait training;Stair training;Functional mobility training;Therapeutic activities;Therapeutic exercise;Balance training;Patient/family education    PT Goals (Current goals can be found in the Care Plan section)  Acute Rehab PT Goals Patient Stated Goal: to improve pain and mobility PT Goal Formulation: With patient/family Time For Goal Achievement: 11/07/22 Potential to Achieve Goals: Good    Frequency BID     Co-evaluation               AM-PAC PT "6 Clicks" Mobility  Outcome Measure Help needed turning from your back to your side while in a flat bed without using bedrails?: None Help needed moving from lying on your back to sitting on the side of a flat bed without using bedrails?: A Little Help needed moving to and from a bed to a chair (including a wheelchair)?: A Little Help needed standing up from a chair using your arms (e.g., wheelchair or bedside chair)?: A Little Help needed to walk in hospital room?: A Little Help needed climbing 3-5 steps with a railing? : A Little 6 Click Score: 19     End of Session Equipment Utilized During Treatment: Gait belt Activity Tolerance: Patient tolerated treatment well Patient left: in chair;with call bell/phone within reach;with chair alarm set;with nursing/sitter in room;with family/visitor present;with SCD's reapplied;Other (comment) (L heel floating via towel roll; polar care in place) Nurse Communication: Mobility status;Precautions;Weight bearing status (pt's HR during session; nursing staff present end of session to assist pt with toileting needs) PT Visit Diagnosis: Other abnormalities of gait and mobility (R26.89);Muscle weakness (generalized) (M62.81);Pain Pain - Right/Left: Left Pain - part of body: Knee    Time: 0923-3007 PT Time Calculation (min) (ACUTE ONLY): 40 min   Charges:   PT Evaluation $PT Eval Low Complexity: 1 Low PT Treatments $Therapeutic Exercise: 8-22 mins $Therapeutic Activity: 8-22 mins        Leitha Bleak, PT 10/24/22, 4:22 PM

## 2022-10-24 NOTE — Anesthesia Procedure Notes (Signed)
Date/Time: 10/24/2022 7:32 AM  Performed by: Lily Peer, Marieliz Strang, CRNAPre-anesthesia Checklist: Emergency Drugs available, Suction available, Patient being monitored, Timeout performed and Patient identified Patient Re-evaluated:Patient Re-evaluated prior to induction Oxygen Delivery Method: Simple face mask Induction Type: IV induction

## 2022-10-24 NOTE — Progress Notes (Signed)
Notified wife that patient is doing well and is going to room 136 on the first floor

## 2022-10-24 NOTE — Transfer of Care (Signed)
Immediate Anesthesia Transfer of Care Note  Patient: Nathaniel Ibarra  Procedure(s) Performed: COMPUTER ASSISTED TOTAL KNEE ARTHROPLASTY (Left: Knee)  Patient Location: PACU  Anesthesia Type:Spinal  Level of Consciousness: awake, alert , and oriented  Airway & Oxygen Therapy: Patient Spontanous Breathing and Patient connected to face mask oxygen  Post-op Assessment: Report given to RN and Post -op Vital signs reviewed and stable  Post vital signs: Reviewed and stable  Last Vitals:  Vitals Value Taken Time  BP 132/55   Temp    Pulse 69   Resp 16   SpO2 95     Last Pain:  Vitals:   10/24/22 0638  TempSrc: Temporal  PainSc: 0-No pain      Patients Stated Pain Goal: 0 (22/56/72 0919)  Complications: No notable events documented.

## 2022-10-24 NOTE — Anesthesia Procedure Notes (Addendum)
Spinal  Patient location during procedure: OR Start time: 10/24/2022 7:17 AM End time: 10/24/2022 7:22 AM Reason for block: surgical anesthesia Staffing Anesthesiologist: Arita Miss, MD Resident/CRNA: Norm Salt, CRNA Performed by: Lily Peer, Hailyn Zarr, CRNA Authorized by: Arita Miss, MD   Preanesthetic Checklist Completed: patient identified, IV checked, site marked, risks and benefits discussed, surgical consent, monitors and equipment checked, pre-op evaluation and timeout performed Spinal Block Patient position: sitting Prep: ChloraPrep Patient monitoring: heart rate, continuous pulse ox and blood pressure Approach: midline Location: L3-4 Injection technique: single-shot Needle Needle type: Pencan  Needle gauge: 24 G Needle length: 10 cm Assessment Events: CSF return

## 2022-10-24 NOTE — Op Note (Signed)
OPERATIVE NOTE  DATE OF SURGERY:  10/24/2022  PATIENT NAME:  Nathaniel Ibarra   DOB: October 31, 1941  MRN: [id number]  PRE-OPERATIVE DIAGNOSIS: Degenerative arthrosis of the left knee, primary  POST-OPERATIVE DIAGNOSIS:  Same  PROCEDURE:  Left total knee arthroplasty using computer-assisted navigation  SURGEON:  Marciano Sequin. M.D.  ASSISTANT: Cassell Smiles, PA-C (present and scrubbed throughout the case, critical for assistance with exposure, retraction, instrumentation, and closure)  ANESTHESIA: spinal  ESTIMATED BLOOD LOSS: 50 mL  FLUIDS REPLACED: 600 mL of crystalloid  TOURNIQUET TIME: 81 minutes  DRAINS: 2 medium Hemovac drains  SOFT TISSUE RELEASES: Anterior cruciate ligament, posterior cruciate ligament, deep medial collateral ligament, patellofemoral ligament  IMPLANTS UTILIZED: DePuy Attune size 6 posterior stabilized femoral component (cemented), size 7 rotating platform tibial component (cemented), 38 mm medialized dome patella (cemented), and a 5 mm stabilized rotating platform polyethylene insert.  INDICATIONS FOR SURGERY: Nathaniel Ibarra is a 81 y.o. year old male with a long history of progressive knee pain. X-rays demonstrated severe degenerative changes in tricompartmental fashion. The patient had not seen any significant improvement despite conservative nonsurgical intervention. After discussion of the risks and benefits of surgical intervention, the patient expressed understanding of the risks benefits and agree with plans for total knee arthroplasty.   The risks, benefits, and alternatives were discussed at length including but not limited to the risks of infection, bleeding, nerve injury, stiffness, blood clots, the need for revision surgery, cardiopulmonary complications, among others, and they were willing to proceed.  PROCEDURE IN DETAIL: The patient was brought into the operating room and, after adequate spinal anesthesia was achieved, a tourniquet was placed  on the patient's upper thigh. The patient's knee and leg were cleaned and prepped with alcohol and DuraPrep and draped in the usual sterile fashion. A "timeout" was performed as per usual protocol. The lower extremity was exsanguinated using an Esmarch, and the tourniquet was inflated to 300 mmHg. An anterior longitudinal incision was made followed by a standard mid vastus approach. The deep fibers of the medial collateral ligament were elevated in a subperiosteal fashion off of the medial flare of the tibia so as to maintain a continuous soft tissue sleeve. The patella was subluxed laterally and the patellofemoral ligament was incised. Inspection of the knee demonstrated severe degenerative changes with full-thickness loss of articular cartilage. Osteophytes were debrided using a rongeur. Anterior and posterior cruciate ligaments were excised. Two 4.0 mm Schanz pins were inserted in the femur and into the tibia for attachment of the array of trackers used for computer-assisted navigation. Hip center was identified using a circumduction technique. Distal landmarks were mapped using the computer. The distal femur and proximal tibia were mapped using the computer. The distal femoral cutting guide was positioned using computer-assisted navigation so as to achieve a 5 distal valgus cut. The femur was sized and it was felt that a size 6 femoral component was appropriate. A size 6 femoral cutting guide was positioned and the anterior cut was performed and verified using the computer. This was followed by completion of the posterior and chamfer cuts. Femoral cutting guide for the central box was then positioned in the center box cut was performed.  Attention was then directed to the proximal tibia. Medial and lateral menisci were excised. The extramedullary tibial cutting guide was positioned using computer-assisted navigation so as to achieve a 0 varus-valgus alignment and 3 posterior slope. The cut was performed and  verified using the computer. The proximal tibia was  sized and it was felt that a size 7 tibial tray was appropriate. Tibial and femoral trials were inserted followed by insertion of a 5 mm polyethylene insert. This allowed for excellent mediolateral soft tissue balancing both in flexion and in full extension. Finally, the patella was cut and prepared so as to accommodate a 38 mm medialized dome patella. A patella trial was placed and the knee was placed through a range of motion with excellent patellar tracking appreciated. The femoral trial was removed after debridement of posterior osteophytes. The central post-hole for the tibial component was reamed followed by insertion of a keel punch. Tibial trials were then removed. Cut surfaces of bone were irrigated with copious amounts of normal saline using pulsatile lavage and then suctioned dry. Polymethylmethacrylate cement with gentamicin was prepared in the usual fashion using a vacuum mixer. Cement was applied to the cut surface of the proximal tibia as well as along the undersurface of a size 7 rotating platform tibial component. Tibial component was positioned and impacted into place. Excess cement was removed using [employer] fast streamer. Cement was then applied to the cut surfaces of the femur as well as along the posterior flanges of the size 6 femoral component. The femoral component was positioned and impacted into place. Excess cement was removed using [employer] fast streamer. A 5 mm polyethylene trial was inserted and the knee was brought into full extension with steady axial compression applied. Finally, cement was applied to the backside of a 38 mm medialized dome patella and the patellar component was positioned and patellar clamp applied. Excess cement was removed using [employer] fast streamer. After adequate curing of the cement, the tourniquet was deflated after a total tourniquet time of 81 minutes. Hemostasis was achieved using electrocautery. The knee was irrigated with  copious amounts of normal saline using pulsatile lavage followed by 450 ml of Surgiphor and then suctioned dry. 20 mL of 1.3% Exparel and 60 mL of 0.25% Marcaine in 40 mL of normal saline was injected along the posterior capsule, medial and lateral gutters, and along the arthrotomy site. A 5 mm stabilized rotating platform polyethylene insert was inserted and the knee was placed through a range of motion with excellent mediolateral soft tissue balancing appreciated and excellent patellar tracking noted. 2 medium drains were placed in the wound bed and brought out through separate stab incisions. The medial parapatellar portion of the incision was reapproximated using interrupted sutures of #1 Vicryl. Subcutaneous tissue was approximated in layers using first #0 Vicryl followed #2-0 Vicryl. The skin was approximated with skin staples. A sterile dressing was applied.  The patient tolerated the procedure well and was transported to the recovery room in stable condition.    Dorette Hartel P. Holley Bouche., M.D.

## 2022-10-24 NOTE — Plan of Care (Signed)

## 2022-10-24 NOTE — Interval H&P Note (Signed)
History and Physical Interval Note:  10/24/2022 6:08 AM  Nathaniel Ibarra  has presented today for surgery, with the diagnosis of Primary osteoarthritis of left knee M17.12.  The various methods of treatment have been discussed with the patient and family. After consideration of risks, benefits and other options for treatment, the patient has consented to  Procedure(s): COMPUTER ASSISTED TOTAL KNEE ARTHROPLASTY (Left) as a surgical intervention.  The patient's history has been reviewed, patient examined, no change in status, stable for surgery.  I have reviewed the patient's chart and labs.  Questions were answered to the patient's satisfaction.     Chicken

## 2022-10-24 NOTE — Discharge Summary (Incomplete)
Physician Discharge Summary  Patient ID: Nathaniel Ibarra MRN: [id number] DOB/AGE: 1941-05-24 81 y.o.  Admit date: 10/24/2022 Discharge date: 10/25/2022  Admission Diagnoses:  Total knee replacement status, left [Z96.652]  Surgeries:Procedure(s): Left total knee arthroplasty using computer-assisted navigation   SURGEON:  Marciano Sequin. M.D.   ASSISTANT: Cassell Smiles, PA-C (present and scrubbed throughout the case, critical for assistance with exposure, retraction, instrumentation, and closure)   ANESTHESIA: spinal   ESTIMATED BLOOD LOSS: 50 mL   FLUIDS REPLACED: 600 mL of crystalloid   TOURNIQUET TIME: 81 minutes   DRAINS: 2 medium Hemovac drains   SOFT TISSUE RELEASES: Anterior cruciate ligament, posterior cruciate ligament, deep medial collateral ligament, patellofemoral ligament   IMPLANTS UTILIZED: DePuy Attune size 6 posterior stabilized femoral component (cemented), size 7 rotating platform tibial component (cemented), 38 mm medialized dome patella (cemented), and a 5 mm stabilized rotating platform polyethylene insert.  Discharge Diagnoses: Patient Active Problem List   Diagnosis Date Noted   Total knee replacement status, 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   Blood clotting disorder ([place]) 04/07/2022   Primary osteoarthritis of left knee 03/07/2022   Acute respiratory failure with hypoxia ([place])    Pneumonia due to COVID-19 virus 12/28/2020   Testicular abscess 11/21/2020   Acute lower UTI 11/21/2020   Chronic HFrEF (heart failure with reduced ejection fraction) ([place]) 11/21/2020   AF (paroxysmal atrial fibrillation) ([place])    Essential hypertension    Hypothyroidism    Acute on chronic combined systolic and diastolic CHF (congestive heart failure) ([place]) 05/16/2020   Acute on chronic systolic CHF (congestive heart failure) ([place]) 05/16/2020   BPH (benign prostatic hyperplasia)    GERD (gastroesophageal reflux disease)    Hypertensive urgency    Bradycardia 05/08/2018    Situational depression 05/08/2018   CHF (congestive heart failure) ([place]) 11/13/2017   Atrial fibrillation with RVR ([place]) 11/12/2017   Pulmonary edema cardiac cause ([place]) 11/12/2017   Elevated troponin 11/12/2017   Coronary artery disease 09/02/2015   Acute coronary syndrome ([place]) 09/01/2015   Unstable angina ([place]) 09/01/2015   Chest pain 08/16/2015    Past Medical History:  Diagnosis Date   Aortic atherosclerosis ([place])    Basal cell carcinoma 05/18/2022   Left inf cheek - EDC   Bladder cancer ([place]) 2008   BPH (benign prostatic hyperplasia)    Bradycardia    Cardiomyopathy ([place])    a.) TTE 08/17/2015: EF 55%; b.) LHC 09/01/2015: EF 70%; c.) LHC 11/14/2017: EF >55%; d.) TTE 11/15/2017: EF 45%; e.) MPI 08/05/2019: EF 45%; f.) TTE 05/17/2020: EF 25-30%; g.) TTE 07/16/2020: EF >55%; h.) TTE 03/02/2022: EF >55%   Cataract, bilateral    Coronary artery disease    a.) questionable NSTEMI 2016; b.) LHC/PCI 09/01/2015: 80% mLAD (2.75 x 15 mm Xience Alpine DES), 50% m-dLCx, 50% dLCx; c.) LHC 11/14/2017: 30% oD1-D1, 40% pLCx, 40% mLAD -- med mgmt   Depression    GERD (gastroesophageal reflux disease)    HFrEF (heart failure with reduced ejection fraction) ([place])    a.) TTE 08/17/2015: EF 55%, anteroseptal HK, triv PR/TR, G1DD; b.) TTE 11/15/2017: EF 45%, mild LAE, mild MR, mod RVE; c.) TTE 05/17/2020: EF 25-30%, glob HK, mod LV dil, mild LVH, mild BAE, mod MR, triv AR; d.) TTE 07/16/2020: EF >55%, mild LVH, triv MR, mild TR/PR; e.) TTE 03/02/2022: EF >55%, mild LVH, LAE, mild MR/TR/PR, G1DD   History of 2019 novel coronavirus disease (COVID-19)    a.) 12/2020; b.) 08/04/2022   HLD (hyperlipidemia)  Hypertension    Hypothyroidism    LBBB (left bundle branch block)    Long term current use of amiodarone    NSTEMI (non-ST elevated myocardial infarction) ([place]) 2016   Osteoarthritis    Paroxysmal atrial fibrillation (HCC)    a.) CHA2DS2VASc = 5 (age x 2, HFrEF, HTN, previous MI);  b.)  rate/rhythm maintained on oral amiodarone; no chronic anticoagulation (discontinued due to cost, medical noncompliance, falls)   Peripheral edema    Pneumonia due to COVID-19 virus 12/2020   Sleep difficulties    a.) uses trazodone PRN   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 male who was admitted 10/24/2022 with a diagnosis of left knee osteoarthritis and went to the operating room on 10/24/2022 and underwent left total knee arthoplasty. The patient received perioperative antibiotics for prophylaxis (see below). The patient tolerated the procedure well and was transported to PACU in stable condition. After meeting PACU criteria, the patient was subsequently transferred to the Orthopaedics/Rehabilitation unit.   The patient received DVT prophylaxis in the form of early mobilization, Lovenox, TED hose, and SCDs . A sacral pad had been placed and heels were elevated off of the bed with rolled towels in order to protect skin integrity. Foley catheter was discontinued on postoperative day #0. Wound drains were discontinued on postoperative day #1. The surgical incision was healing well without signs of infection.  Physical therapy was initiated postoperatively for transfers, gait training, and strengthening. Occupational therapy was initiated for activities of daily living and evaluation for assisted devices. Rehabilitation goals were reviewed in detail with the patient. The patient made steady progress with physical therapy and physical therapy recommended discharge to Home.   The patient achieved the preliminary goals of this hospitalization and was felt to be medically and orthopaedically appropriate for discharge.  He was given perioperative antibiotics:  Anti-infectives (From admission, onward)    Start     Dose/Rate Route Frequency  Ordered Stop   10/24/22 1330  ceFAZolin (ANCEF) IVPB 2g/100 mL premix        2 g 200 mL/hr over 30 Minutes Intravenous Every 6 hours 10/24/22 1136 10/24/22 2110   10/24/22 0621  ceFAZolin (ANCEF) 2-4 GM/100ML-% IVPB       Note to Pharmacy: [facility], [facility]: cabinet override      10/24/22 0621 10/24/22 2110   10/24/22 0600  ceFAZolin (ANCEF) IVPB 2g/100 mL premix        2 g 200 mL/hr over 30 Minutes Intravenous On call to O.R. 10/24/22 0040 10/24/22 0741     .  Recent vital signs:  Vitals:   10/25/22 0801 10/25/22 1558  BP: (!) 172/71 (!) 170/69  Pulse: (!) 56 (!) 58  Resp:  16  Temp: (!) 97.5 F (36.4 C) (!) 97.5 F (36.4 C)  SpO2: 90% 91%    Recent laboratory studies:  No results for input(s): "WBC", "HGB", "HCT", "PLT", "K", "CL", "CO2", "BUN", "CREATININE", "GLUCOSE", "CALCIUM", "LABPT", "INR" in the last 72 hours.  Diagnostic Studies: DG Knee Left Port  Result Date: 10/24/2022 CLINICAL DATA:  Status post left knee arthroplasty EXAM: PORTABLE LEFT KNEE - 1-2 VIEW COMPARISON:  06/22/2022 FINDINGS: There is interval left knee arthroplasty. No fracture is seen. Surgical drains are noted in the anterior aspect. Skin staples are seen anteriorly. IMPRESSION: Status  post left knee arthroplasty. Electronically Signed   By: Elmer Picker M.D.   On: 10/24/2022 11:12    Discharge Medications:   Allergies as of 10/25/2022   No Known Allergies      Medication List     STOP taking these medications    aspirin EC 81 MG tablet       TAKE these medications    acetaminophen 325 MG tablet Commonly known as: TYLENOL Take 2 tablets (650 mg total) by mouth every 6 (six) hours as needed for mild pain or headache (fever >/= 101).   albuterol 108 (90 Base) MCG/ACT inhaler Commonly known as: VENTOLIN HFA Inhale 1 puff into the lungs every 4 (four) hours as needed.   amiodarone 200 MG tablet Commonly known as: Pacerone Take 1 tablet (200 mg total) by mouth daily.    celecoxib 200 MG capsule Commonly known as: CELEBREX Take 1 capsule (200 mg total) by mouth 2 (two) times daily.   enoxaparin 40 MG/0.4ML injection Commonly known as: LOVENOX Inject 0.4 mLs (40 mg total) into the skin daily for 14 days.   finasteride 5 MG tablet Commonly known as: PROSCAR Take 1 tablet (5 mg total) by mouth daily.   levothyroxine 100 MCG tablet Commonly known as: SYNTHROID Take 1 tablet by mouth daily.   oxyCODONE 5 MG immediate release tablet Commonly known as: Oxy IR/ROXICODONE Take 1 tablet (5 mg total) by mouth every 4 (four) hours as needed for severe pain.   PARoxetine 20 MG tablet Commonly known as: PAXIL Take 20 mg by mouth daily.   spironolactone 25 MG tablet Commonly known as: ALDACTONE Take 0.5 tablets (12.5 mg total) by mouth daily.   tamsulosin 0.4 MG Caps capsule Commonly known as: FLOMAX Take 0.8 mg by mouth at bedtime.   traMADol 50 MG tablet Commonly known as: ULTRAM Take 1 tablet (50 mg total) by mouth every 4 (four) hours as needed for moderate pain or severe pain.   traZODone 50 MG tablet Commonly known as: DESYREL Take 50 mg by mouth at bedtime.               Durable Medical Equipment  (From admission, onward)           Start     Ordered   10/24/22 1137  DME Walker rolling  Once       Question:  Patient needs a walker to treat with the following condition  Answer:  Total knee replacement status   10/24/22 1136   10/24/22 1137  DME Bedside commode  Once       Comments: Patient is not able to walk the distance required to go the bathroom, or he/she is unable to safely negotiate stairs required to access the bathroom.  A 3in1 BSC will alleviate this problem  Question:  Patient needs a bedside commode to treat with the following condition  Answer:  Total knee replacement status   10/24/22 1136            Disposition: Home with home health PT     Follow-up Information     Watt Climes, PA Follow up on  11/07/2022.   Specialty: Physician Assistant Why: at 1:15pm Contact information: [place] [address] [phone]         Dereck Leep, MD Follow up on 12/06/2022.   Specialty: Orthopedic Surgery Why: at 3:00pm Contact information: [address] [address] [phone]  Tamala Julian, PA-C 10/25/2022, 5:33 PM

## 2022-10-24 NOTE — Anesthesia Preprocedure Evaluation (Signed)
Anesthesia Evaluation  Patient identified by MRN, date of birth, ID band Patient awake    Reviewed: Allergy & Precautions, NPO status , Patient's Chart, lab work & pertinent test results  History of Anesthesia Complications Negative for: history of anesthetic complications  Airway Mallampati: III  TM Distance: >3 FB Neck ROM: Full    Dental no notable dental hx. (+) Teeth Intact   Pulmonary neg pulmonary ROS, neg sleep apnea, neg COPD, Patient abstained from smoking.Not current smoker   Pulmonary exam normal breath sounds clear to auscultation       Cardiovascular Exercise Tolerance: Good METS: 3 - Mets hypertension, + angina  + CAD, + Past MI, + Cardiac Stents and +CHF  (-) dysrhythmias  Rhythm:Regular Rate:Normal - Systolic murmurs  Patient with questionable NSTEMI in 2016.  Documentation varies between providers asked whether or not patient ruled in for cardiovascular event.    Diagnostic left heart catheterization was performed on 09/01/2015 revealing normal left ventricular systolic function with a hyperdynamic LVEF of 70%.  There was multivessel CAD; 80% mid LAD, 50% mid to distal LCx, and 50% distal LCx.  PCI was subsequently performed placing a 2.75 x 15 mm Xience Alpine DES x1 to the mid LAD lesion yielding excellent angiographic result and TIMI-3 flow.    Repeat diagnostic left heart catheterization was performed on 11/14/2017 revealing multivessel CAD; 30% ostial D1-D1, 40% proximal LCx, and 40% mid LAD.  Previously placed stent was widely patent.  Given the nonobstructive nature of her disease, further intervention was deferred opting for medical management.    Patient with a history of cardiomyopathy with resulting HFrEF.  Cardiac function has been serially monitored via TTE since time of diagnosis.  Most recent TTE was performed on 03/02/2022 revealing a normal left ventricular systolic function with an EF of >55%.   There was mild LVH and left atrial enlargement.  Left ventricular diastolic Doppler parameters consistent with abnormal relaxation (G1DD). There was mild mitral, tricuspid, and pulmonic valve regurgitation.  There was no evidence of a significant transvalvular gradient to suggest stenosis.    Neuro/Psych  PSYCHIATRIC DISORDERS  Depression    negative neurological ROS     GI/Hepatic ,GERD  Controlled,,(+)     (-) substance abuse    Endo/Other  neg diabetesHypothyroidism    Renal/GU negative Renal ROS     Musculoskeletal  (+) Arthritis ,    Abdominal   Peds  Hematology   Anesthesia Other Findings Past Medical History: No date: Aortic atherosclerosis ([place]) 05/18/2022: Basal cell carcinoma     Comment:  Left inf cheek - EDC 2008: Bladder cancer ([place]) No date: BPH (benign prostatic hyperplasia) No date: Bradycardia No date: Cardiomyopathy [facility])     Comment:  a.) TTE 08/17/2015: EF 55%; b.) LHC 09/01/2015: EF 70%;               c.) LHC 11/14/2017: EF >55%; d.) TTE 11/15/2017: EF 45%;               e.) MPI 08/05/2019: EF 45%; f.) TTE 05/17/2020: EF               25-30%; g.) TTE 07/16/2020: EF >55%; h.) TTE 03/02/2022:               EF >55% No date: Cataract, bilateral No date: Coronary artery disease     Comment:  a.) questionable NSTEMI 2016; b.) LHC/PCI 09/01/2015:  80% mLAD (2.75 x 15 mm Xience Alpine DES), 50% m-dLCx,               50% dLCx; c.) LHC 11/14/2017: 30% oD1-D1, 40% pLCx, 40%               mLAD -- med mgmt No date: Depression No date: GERD (gastroesophageal reflux disease) No date: HFrEF (heart failure with reduced ejection fraction) ([place])     Comment:  a.) TTE 08/17/2015: EF 55%, anteroseptal HK, triv PR/TR,              G1DD; b.) TTE 11/15/2017: EF 45%, mild LAE, mild MR, mod               RVE; c.) TTE 05/17/2020: EF 25-30%, glob HK, mod LV dil,               mild LVH, mild BAE, mod MR, triv AR; d.) TTE 07/16/2020:               EF >55%, mild  LVH, triv MR, mild TR/PR; e.) TTE               03/02/2022: EF >55%, mild LVH, LAE, mild MR/TR/PR, G1DD No date: History of 2019 novel coronavirus disease (COVID-19)     Comment:  a.) 12/2020; b.) 08/04/2022 No date: HLD (hyperlipidemia) No date: Hypertension No date: Hypothyroidism No date: LBBB (left bundle branch block) No date: Long term current use of amiodarone 2016: NSTEMI (non-ST elevated myocardial infarction) ([place]) No date: Osteoarthritis No date: Paroxysmal atrial fibrillation (HCC)     Comment:  a.) CHA2DS2VASc = 5 (age x 2, HFrEF, HTN, previous MI);               b.) rate/rhythm maintained on oral amiodarone; no chronic              anticoagulation (discontinued due to cost, medical               noncompliance, falls) No date: Peripheral edema 12/2020: Pneumonia due to COVID-19 virus No date: Sleep difficulties     Comment:  a.) uses trazodone PRN 01/26/2022: Squamous cell carcinoma of skin     Comment:  L mid dorsum forearm - ED&C  Reproductive/Obstetrics                             Anesthesia Physical Anesthesia Plan  ASA: 3  Anesthesia Plan: Spinal   Post-op Pain Management: Celebrex PO (pre-op)*, Gabapentin PO (pre-op)* and Ofirmev IV (intra-op)*   Induction: Intravenous  PONV Risk Score and Plan: 3 and 1 and Ondansetron, Dexamethasone, Propofol infusion, TIVA and Treatment may vary due to age or medical condition  Airway Management Planned: Natural Airway  Additional Equipment: None  Intra-op Plan:   Post-operative Plan:   Informed Consent: I have reviewed the patients History and Physical, chart, labs and discussed the procedure including the risks, benefits and alternatives for the proposed anesthesia with the patient or authorized representative who has indicated his/her understanding and acceptance.       Plan Discussed with: CRNA and Surgeon  Anesthesia Plan Comments: (Discussed R/B/A of neuraxial anesthesia technique  with patient: - rare risks of spinal/epidural hematoma, nerve damage, infection - Risk of PDPH - Risk of nausea and vomiting - Risk of conversion to general anesthesia and its associated risks, including sore throat, damage to lips/eyes/teeth/oropharynx, and rare risks such as cardiac and respiratory events. - Risk  of allergic reactions  Discussed the role of CRNA in patient's perioperative care.  Patient voiced understanding.)       Anesthesia Quick Evaluation

## 2022-10-25 DIAGNOSIS — M1712 Unilateral primary osteoarthritis, left knee: Secondary | ICD-10-CM | POA: Diagnosis not present

## 2022-10-25 MED ORDER — OXYCODONE HCL 5 MG PO TABS: 5.0000 mg | ORAL_TABLET | ORAL | 0 refills | Status: DC | PRN

## 2022-10-25 MED ORDER — ENOXAPARIN SODIUM 40 MG/0.4ML IJ SOSY: 40.0000 mg | PREFILLED_SYRINGE | INTRAMUSCULAR | 0 refills | Status: DC

## 2022-10-25 MED ORDER — CELECOXIB 200 MG PO CAPS: 200.0000 mg | ORAL_CAPSULE | Freq: Two times a day (BID) | ORAL | 0 refills | Status: DC

## 2022-10-25 MED ORDER — TRAMADOL HCL 50 MG PO TABS: 50.0000 mg | ORAL_TABLET | ORAL | 0 refills | Status: DC | PRN

## 2022-10-25 NOTE — Progress Notes (Signed)
Post-op dressing removed. , Hemovac removed., and Mini compression dressing applied.  Mild drainage noted.

## 2022-10-25 NOTE — Progress Notes (Signed)
  Subjective: 1 Day Post-Op Procedure(s) (LRB): COMPUTER ASSISTED TOTAL KNEE ARTHROPLASTY (Left) Patient reports pain as well-controlled.   Patient is well, and has had no acute complaints or problems Plan is to go Home after hospital stay. Negative for chest pain and shortness of breath Fever: no Gastrointestinal: negative for nausea and vomiting.   Patient has not had a bowel movement.  Objective: Vital signs in last 24 hours: Temp:  [97.2 F (36.2 C)-98.7 F (37.1 C)] 97.5 F (36.4 C) (11/07 0801) Pulse Rate:  [56-70] 56 (11/07 0801) Resp:  [14-17] 17 (11/06 1141) BP: (132-172)/(57-71) 172/71 (11/07 0801) SpO2:  [90 %-98 %] 90 % (11/07 0801)  Intake/Output from previous day:  Intake/Output Summary (Last 24 hours) at 10/25/2022 0827 Last data filed at 10/25/2022 0430 Gross per 24 hour  Intake 2766.82 ml  Output 1975 ml  Net 791.82 ml    Intake/Output this shift: No intake/output data recorded.  Labs: No results for input(s): "HGB" in the last 72 hours. No results for input(s): "WBC", "RBC", "HCT", "PLT" in the last 72 hours. No results for input(s): "NA", "K", "CL", "CO2", "BUN", "CREATININE", "GLUCOSE", "CALCIUM" in the last 72 hours. No results for input(s): "LABPT", "INR" in the last 72 hours.   EXAM General - Patient is Alert, Appropriate, and Oriented Extremity - Neurovascular intact Dorsiflexion/Plantar flexion intact Compartment soft Dressing/Incision -Postoperative dressing remains in place., Polar Care in place and working. , Hemovac in place.  Motor Function - intact, moving foot and toes well on exam.  Cardiovascular-  slightly bradycardic rate, normal rhythm Respiratory- Lungs clear to auscultation bilaterally Gastrointestinal- soft, nontender, and active bowel sounds   Assessment/Plan: 1 Day Post-Op Procedure(s) (LRB): COMPUTER ASSISTED TOTAL KNEE ARTHROPLASTY (Left) Principal Problem:   Total knee replacement status, left  Estimated body mass  index is 26.58 kg/m as calculated from the following:   Height as of this encounter: 6' (1.829 m).   Weight as of this encounter: 88.9 kg. Advance diet Up with therapy  Likely d/c this PM,pending completion of therapy goals.    DVT Prophylaxis - Lovenox, Ted hose, and SCDs Weight-Bearing as tolerated to left leg  Cassell Smiles, PA-C [facility] Orthopaedic Surgery 10/25/2022, 8:27 AM

## 2022-10-25 NOTE — Progress Notes (Signed)
Physical Therapy Treatment Patient Details Name: Nathaniel Ibarra MRN: [id number] DOB: 19-Nov-1941 Today's Date: 10/25/2022   History of Present Illness Pt is an 81 y.o. male s/p L TKA secondary degenerative arthrosis 10/24/22.  PMH includes: a-fib (s/p PCI of LAD 2016), cardiac cath 2018, h/o urinary incontinence, COVID-19 12/2020, thyroid disease, bladder CA, bradycardia.    PT Comments    Pt resting in recliner upon PT arrival (PT attempted to see pt earlier this morning but pt requested to eat breakfast first and then with OT); agreeable to therapy.  0/10 L knee pain at rest beginning of session; 7/10 with activity; and 3/10 at rest end of session.  During session pt's O2 sats 90% at rest and 88% with activity on room air (PA and pt's nurse notified); transfers SBA with RW use; CGA with ambulation 120 feet with RW use; and CGA navigating 4 steps with RW use.  Educated pt and pt's wife on and encouraged HHPT recommendations but pt and pt's wife reporting they did not feel pt needed HHPT and was planning for OP PT instead (TOC notified).  Pt's wife reports they will have 2 strong men that will help pt with navigating stairs to get into home.  L knee AROM flexion to 90 degrees.  Will continue to focus on strengthening, knee ROM, and progressive functional mobility during hospitalization.   Recommendations for follow up therapy are one component of a multi-disciplinary discharge planning process, led by the attending physician.  Recommendations may be updated based on patient status, additional functional criteria and insurance authorization.  Follow Up Recommendations  Home health PT     Assistance Recommended at Discharge Frequent or constant Supervision/Assistance  Patient can return home with the following A little help with walking and/or transfers;A little help with bathing/dressing/bathroom;Assistance with cooking/housework;Assist for transportation;Help with stairs or ramp for entrance    Equipment Recommendations  None recommended by PT (pt has RW and BSC at home already)    Recommendations for Other Services OT consult     Precautions / Restrictions Precautions Precautions: Fall;Knee Precaution Booklet Issued: Yes (comment) Required Braces or Orthoses: Knee Immobilizer - Left Knee Immobilizer - Left: Discontinue once straight leg raise with < 10 degree lag Restrictions Weight Bearing Restrictions: Yes LLE Weight Bearing: Weight bearing as tolerated     Mobility  Bed Mobility               General bed mobility comments: Deferred (pt sitting in recliner upon PT arrival)    Transfers Overall transfer level: Needs assistance Equipment used: Rolling walker (2 wheels) Transfers: Sit to/from Stand Sit to Stand: Supervision           General transfer comment: x2 trials from recliner; mild increased effort to perform on own; occasional vc's for L LE placement when sitting    Ambulation/Gait Ambulation/Gait assistance: Min guard Gait Distance (Feet): 120 Feet Assistive device: Rolling walker (2 wheels)   Gait velocity: decreased     General Gait Details: antalgic; decreased stance time L LE; partial step through gait pattern; steady with RW use   Stairs Stairs: Yes Stairs assistance: Min guard Stair Management: Step to pattern, With walker, Backwards, Forwards Number of Stairs: 4 General stair comments: ascending 4 steps backwards with RW and then descended 4 steps forwards with RW; initial vc's for LE sequencing (and then pt's wife able to verbalize to pt appropriate LE sequencing); steady with RW   Wheelchair Mobility    Modified Rankin (Stroke Patients Only)  Balance Overall balance assessment: Needs assistance Sitting-balance support: No upper extremity supported, Feet supported Sitting balance-Leahy Scale: Normal Sitting balance - Comments: steady sitting reaching outside BOS   Standing balance support: Bilateral upper  extremity supported, During functional activity Standing balance-Leahy Scale: Good Standing balance comment: steady ambulating with RW use                            Cognition Arousal/Alertness: Awake/alert Behavior During Therapy: WFL for tasks assessed/performed Overall Cognitive Status: Within Functional Limits for tasks assessed                                          Exercises Total Joint Exercises Long Arc Quad: AROM, Strengthening, Left, 10 reps, Seated Goniometric ROM: L knee AROM 10 degrees to 90 degrees General Exercises - Lower Extremity Hip Flexion/Marching: AROM, Strengthening, Both, 10 reps, Seated    General Comments General comments (skin integrity, edema, etc.): L LE hemovac in place.  Nursing cleared pt for participation in physical therapy.  Pt agreeable to PT session.  Pt's wife present entire session.      Pertinent Vitals/Pain Pain Assessment Pain Assessment: 0-10 Pain Score: 3  Pain Location: L knee Pain Descriptors / Indicators: Aching, Tender, Sore Pain Intervention(s): Limited activity within patient's tolerance, Monitored during session, Premedicated before session, Repositioned, Other (comment) (polar care applied) HR 57-83 bpm during sessions activities.    Home Living                          Prior Function            PT Goals (current goals can now be found in the care plan section) Acute Rehab PT Goals Patient Stated Goal: to improve pain and mobility PT Goal Formulation: With patient/family Time For Goal Achievement: 11/07/22 Potential to Achieve Goals: Good Progress towards PT goals: Progressing toward goals    Frequency    BID      PT Plan Current plan remains appropriate    Co-evaluation              AM-PAC PT "6 Clicks" Mobility   Outcome Measure  Help needed turning from your back to your side while in a flat bed without using bedrails?: None Help needed moving from lying  on your back to sitting on the side of a flat bed without using bedrails?: A Little Help needed moving to and from a bed to a chair (including a wheelchair)?: A Little Help needed standing up from a chair using your arms (e.g., wheelchair or bedside chair)?: A Little Help needed to walk in hospital room?: A Little Help needed climbing 3-5 steps with a railing? : A Little 6 Click Score: 19    End of Session Equipment Utilized During Treatment: Gait belt Activity Tolerance: Patient tolerated treatment well Patient left: in chair;with call bell/phone within reach;with chair alarm set;with family/visitor present;with SCD's reapplied;Other (comment) (B heels floating via towel rolls; polar care in place) Nurse Communication: Mobility status;Precautions;Weight bearing status (pt's O2 sats during session (PA and nurse notified)) PT Visit Diagnosis: Other abnormalities of gait and mobility (R26.89);Muscle weakness (generalized) (M62.81);Pain Pain - Right/Left: Left Pain - part of body: Knee     Time: 5465-6812 PT Time Calculation (min) (ACUTE ONLY): 45 min  Charges:  $Gait Training: 23-37  mins $Therapeutic Activity: 8-22 mins                     Leitha Bleak, PT 10/25/22, 2:10 PM

## 2022-10-25 NOTE — Progress Notes (Signed)
Physical Therapy Treatment Patient Details Name: Nathaniel Ibarra MRN: [id number] DOB: 1941-05-01 Today's Date: 10/25/2022   History of Present Illness Pt is an 81 y.o. male s/p L TKA secondary degenerative arthrosis 10/24/22.  PMH includes: a-fib (s/p PCI of LAD 2016), cardiac cath 2018, h/o urinary incontinence, COVID-19 12/2020, thyroid disease, bladder CA, bradycardia.    PT Comments    Pt resting in recliner upon PT arrival; pt's wife present; pt agreeable to PT session.  Pain L knee 7/10 with activity but 3/10 at rest.  During session pt SBA with transfers using RW; CGA progressing to SBA with ambulating 200 feet with RW use; and modified independent to lay down in bed end of session.  Performed and reviewed LE TKR HEP (issued written copy to pt)--pt's wife reports she will assist pt as needed with LE ex's.  Pt and pt's wife report no concerns with stairs to enter home (practiced in morning therapy session) and declined to practice again.  Reviewed home safety and safe car transfer technique: both pt and pt's wife verbalizing appropriate understanding.  Pt's O2 sats 98% at rest and 89% with activity on room air--PA and pt's nurse notified.  Pt appears safe to discharge home (with support/assist from wife as needed)--PA and nurse notified.  Pt and pt's wife report no further questions/concerns regarding home discharge.  Pt and pt's wife still declining HHPT during session--TOC notified (TOC then talked with pt/pt's wife and reports plan to keep HHPT).   Recommendations for follow up therapy are one component of a multi-disciplinary discharge planning process, led by the attending physician.  Recommendations may be updated based on patient status, additional functional criteria and insurance authorization.  Follow Up Recommendations  Home health PT     Assistance Recommended at Discharge Frequent or constant Supervision/Assistance  Patient can return home with the following A little help with  walking and/or transfers;A little help with bathing/dressing/bathroom;Assistance with cooking/housework;Assist for transportation;Help with stairs or ramp for entrance   Equipment Recommendations  None recommended by PT (pt has RW and BSC at home already)    Recommendations for Other Services OT consult     Precautions / Restrictions Precautions Precautions: Fall;Knee Precaution Booklet Issued: Yes (comment) Required Braces or Orthoses: Knee Immobilizer - Left Knee Immobilizer - Left: Discontinue once straight leg raise with < 10 degree lag Restrictions Weight Bearing Restrictions: Yes LLE Weight Bearing: Weight bearing as tolerated     Mobility  Bed Mobility Overal bed mobility: Modified Independent Bed Mobility: Sit to Supine       Sit to supine: Modified independent (Device/Increase time)   General bed mobility comments: bed flat; no difficulties noted    Transfers Overall transfer level: Needs assistance Equipment used: Rolling walker (2 wheels) Transfers: Sit to/from Stand Sit to Stand: Supervision           General transfer comment: x2 trials from recliner and x1 trial from bed; mild increased effort to perform on own; occasional vc's for L LE placement when sitting (pt's wife able to verbalize precautions to pt)    Ambulation/Gait Ambulation/Gait assistance: Min guard, Supervision Gait Distance (Feet): 200 Feet Assistive device: Rolling walker (2 wheels)   Gait velocity: decreased     General Gait Details: antalgic; decreased stance time L LE; partial step through gait pattern; steady with RW use   Stairs   Wheelchair Mobility    Modified Rankin (Stroke Patients Only)       Balance Overall balance assessment: Needs assistance Sitting-balance  support: No upper extremity supported, Feet supported Sitting balance-Leahy Scale: Normal Sitting balance - Comments: steady sitting reaching outside BOS   Standing balance support: Bilateral upper  extremity supported, During functional activity Standing balance-Leahy Scale: Good Standing balance comment: steady ambulating with RW use                            Cognition Arousal/Alertness: Awake/alert Behavior During Therapy: WFL for tasks assessed/performed Overall Cognitive Status: Within Functional Limits for tasks assessed                                          Exercises Total Joint Exercises Ankle Circles/Pumps: AROM, Strengthening, Both, 10 reps, Supine Quad Sets: AROM, Strengthening, Left, 10 reps, Supine Short Arc Quad: AROM, Strengthening, Left, 10 reps, Supine Heel Slides: AROM, Strengthening, Left, 10 reps, Supine Hip ABduction/ADduction: AROM, Strengthening, Left, 10 reps, Supine Straight Leg Raises: AROM, Strengthening, Left, 10 reps, Supine    General Comments General comments (skin integrity, edema, etc.): L LE hemovac in place.  Nursing cleared pt for participation in physical therapy.  Pt agreeable to PT session.      Pertinent Vitals/Pain Pain Assessment Pain Assessment: 0-10 Pain Score: 3  Pain Location: L knee Pain Descriptors / Indicators: Aching, Tender, Sore Pain Intervention(s): Limited activity within patient's tolerance, Monitored during session, Premedicated before session, Repositioned, Other (comment) (polar care applied)    Home Living                          Prior Function            PT Goals (current goals can now be found in the care plan section) Acute Rehab PT Goals Patient Stated Goal: to improve pain and mobility PT Goal Formulation: With patient/family Time For Goal Achievement: 11/07/22 Potential to Achieve Goals: Good Progress towards PT goals: Progressing toward goals    Frequency    BID      PT Plan Current plan remains appropriate    Co-evaluation              AM-PAC PT "6 Clicks" Mobility   Outcome Measure  Help needed turning from your back to your side  while in a flat bed without using bedrails?: None Help needed moving from lying on your back to sitting on the side of a flat bed without using bedrails?: A Little Help needed moving to and from a bed to a chair (including a wheelchair)?: A Little Help needed standing up from a chair using your arms (e.g., wheelchair or bedside chair)?: A Little Help needed to walk in hospital room?: A Little Help needed climbing 3-5 steps with a railing? : A Little 6 Click Score: 19    End of Session Equipment Utilized During Treatment: Gait belt Activity Tolerance: Patient tolerated treatment well Patient left: in bed;with call bell/phone within reach;with bed alarm set;with family/visitor present;with SCD's reapplied;Other (comment) (L heel in bone foam; R heel floating via towel roll; polar care in place) Nurse Communication: Mobility status;Precautions;Weight bearing status;Other (comment) (pt's O2 sats during session (PA also notified)) PT Visit Diagnosis: Other abnormalities of gait and mobility (R26.89);Muscle weakness (generalized) (M62.81);Pain Pain - Right/Left: Left Pain - part of body: Knee     Time: 3338-3291 PT Time Calculation (min) (ACUTE ONLY): 31 min  Charges:  $  Gait Training: 8-22 mins $Therapeutic Exercise: 8-22 mins                    Leitha Bleak, PT 10/25/22, 4:34 PM

## 2022-10-25 NOTE — Progress Notes (Signed)
The patient lives at home with his Spouse Spouse to provide transportation He has Grab bars, 3 in 1, RW, and cane at home No additional DME needed [facility] is set up prior to surgery for [facility]

## 2022-10-25 NOTE — Progress Notes (Signed)
1801 D/C AVS completed and reviewed with pt. All opportunities for questions answered and clarified. IV removed. Pt will be wheeled down to car at [facility] mall entrance via wheelchair.

## 2022-10-25 NOTE — Evaluation (Signed)
Occupational Therapy Evaluation Patient Details Name: Nathaniel Ibarra MRN: [id number] DOB: March 25, 1941 Today's Date: 10/25/2022   History of Present Illness Pt is an 81 y.o. male s/p L TKA secondary degenerative arthrosis 10/24/22.  PMH includes: a-fib (s/p PCI of LAD 2016), cardiac cath 2018, h/o urinary incontinence, COVID-19 12/2020, thyroid disease, bladder CA, bradycardia.   Clinical Impression   Pt seen for OT evaluation this date, POD#1 from above surgery. Pt was mod independent in all ADL prior to surgery, and using SPC for past ~29mo2/2 knee pain, with history of a few falls due to L knee. Pt is eager to return to PLOF with less pain and improved safety and independence. Pt currently requires minimal assist for LB dressing and bathing while in seated position due to decreased strength, AROM of L knee, and impaired balance. Pt completed bed mobility with supervision, CGA to stand with VC for hand placement with RW, and ambulated from EOB a few feet to the recliner. Pt instructed in polar care mgt, falls prevention strategies, home/routines modifications, DME/AE for LB bathing and dressing tasks, and compression stocking mgt. Handout provided to support recall and carryover. Pt would benefit from skilled OT services including additional instruction in dressing techniques with or without assistive devices for dressing and bathing skills to support recall and carryover prior to discharge and ultimately to maximize safety, independence, and minimize falls risk and caregiver burden. Do not currently anticipate any OT needs following this hospitalization.      Recommendations for follow up therapy are one component of a multi-disciplinary discharge planning process, led by the attending physician.  Recommendations may be updated based on patient status, additional functional criteria and insurance authorization.   Follow Up Recommendations  Follow physician's recommendations for discharge plan and  follow up therapies    Assistance Recommended at Discharge Intermittent Supervision/Assistance  Patient can return home with the following A little help with walking and/or transfers;A little help with bathing/dressing/bathroom;Assistance with cooking/housework;Assist for transportation;Help with stairs or ramp for entrance    Functional Status Assessment  Patient has had a recent decline in their functional status and demonstrates the ability to make significant improvements in function in a reasonable and predictable amount of time.  Equipment Recommendations  Other (comment) (Management consultant    Recommendations for Other Services       Precautions / Restrictions Precautions Precautions: Fall;Knee Precaution Booklet Issued: Yes (comment) Restrictions LLE Weight Bearing: Weight bearing as tolerated      Mobility Bed Mobility Overal bed mobility: Needs Assistance Bed Mobility: Supine to Sit     Supine to sit: Supervision, HOB elevated          Transfers Overall transfer level: Needs assistance Equipment used: Rolling walker (2 wheels) Transfers: Sit to/from Stand Sit to Stand: Min guard           General transfer comment: VCs for hand placement      Balance Overall balance assessment: Needs assistance Sitting-balance support: No upper extremity supported, Feet supported Sitting balance-Leahy Scale: Good     Standing balance support: Bilateral upper extremity supported, During functional activity Standing balance-Leahy Scale: Fair                             ADL either performed or assessed with clinical judgement   ADL Overall ADL's : Needs assistance/impaired  General ADL Comments: Pt currently requires MIN A for LB ADL tasks, CGA for ADL transfers with RW, and MOD A for polar care and compression stocking mgt. Pt reports spouse able to provide needed level of assist at discharge.     Vision          Perception     Praxis      Pertinent Vitals/Pain Pain Assessment Pain Assessment: No/denies pain Pain Location: L knee Pain Intervention(s): Limited activity within patient's tolerance, Monitored during session, Repositioned, Premedicated before session, Ice applied     Hand Dominance     Extremity/Trunk Assessment Upper Extremity Assessment Upper Extremity Assessment: Overall WFL for tasks assessed   Lower Extremity Assessment Lower Extremity Assessment: LLE deficits/detail LLE Deficits / Details: expected post-op strength/ROM deficits       Communication Communication Communication: No difficulties   Cognition Arousal/Alertness: Awake/alert Behavior During Therapy: WFL for tasks assessed/performed Overall Cognitive Status: Within Functional Limits for tasks assessed                                       General Comments       Exercises Other Exercises Other Exercises: Pt educated in home/routines modifications, ADL transfers with RW, falls prevention, AE/DME, polar care mgt, and compression stocking mgt. Handout provided to support recall and carryover. Other Exercises: Pt ambulated from EOB a few feet to the recliner   Shoulder Instructions      Home Living Family/patient expects to be discharged to:: Private residence Living Arrangements: Spouse/significant other Available Help at Discharge: Family;Available 24 hours/day Type of Home: House Home Access: Stairs to enter [employer] of Steps: 3 Entrance Stairs-Rails: None Home Layout: One level     Bathroom Shower/Tub: Occupational psychologist: Handicapped height     Home Equipment: Grab bars - tub/shower;Shower seat;BSC/3in1;Rolling Environmental consultant (2 wheels);Cane - single point          Prior Functioning/Environment Prior Level of Function : Independent/Modified Independent             Mobility Comments: Pt has been using SPC for about the last 6 months d/t  L knee pain (no AD use prior to that). ADLs Comments: indep with ADL        OT Problem List: Decreased strength;Decreased range of motion;Impaired balance (sitting and/or standing);Decreased knowledge of use of DME or AE      OT Treatment/Interventions: Self-care/ADL training;Therapeutic exercise;Therapeutic activities;DME and/or AE instruction;Patient/family education;Balance training    OT Goals(Current goals can be found in the care plan section) Acute Rehab OT Goals Patient Stated Goal: go home OT Goal Formulation: With patient Time For Goal Achievement: 11/08/22 Potential to Achieve Goals: Good ADL Goals Pt Will Perform Lower Body Dressing: with caregiver independent in assisting;with modified independence;sit to/from stand Pt Will Transfer to Toilet: with modified independence;ambulating (LRAD) Pt Will Perform Toileting - Clothing Manipulation and hygiene: with modified independence Additional ADL Goal #1: Pt will independently instruct family in polar care mgt Additional ADL Goal #2: Pt will independently instruct family in compression stocking mgt  OT Frequency: Min 2X/week    Co-evaluation              AM-PAC OT "6 Clicks" Daily Activity     Outcome Measure Help from another person eating meals?: None Help from another person taking care of personal grooming?: None Help from another person toileting, which includes using toliet,  bedpan, or urinal?: A Little Help from another person bathing (including washing, rinsing, drying)?: A Little Help from another person to put on and taking off regular upper body clothing?: None Help from another person to put on and taking off regular lower body clothing?: A Little 6 Click Score: 21   End of Session Equipment Utilized During Treatment: Rolling walker (2 wheels)  Activity Tolerance: Patient tolerated treatment well Patient left: in chair;with call bell/phone within reach;with chair alarm set;with SCD's reapplied;Other  (comment) (polar care in place)  OT Visit Diagnosis: Other abnormalities of gait and mobility (R26.89)                Time: 8372-9021 OT Time Calculation (min): 16 min Charges:  OT General Charges $OT Visit: 1 Visit OT Evaluation $OT Eval Low Complexity: 1 Low OT Treatments $Self Care/Home Management : 8-22 mins  Ardeth Perfect., MPH, MS, OTR/L ascom [phone] 10/25/22, 9:48 AM

## 2022-10-25 NOTE — Plan of Care (Signed)
  Problem: Activity: Goal: Range of joint motion will improve Outcome: Progressing   Problem: Clinical Measurements: Goal: Postoperative complications will be avoided or minimized Outcome: Progressing   Problem: Pain Management: Goal: Pain level will decrease with appropriate interventions Outcome: Progressing   Problem: Education: Goal: Knowledge of General Education information will improve Description: Including pain rating scale, medication(s)/side effects and non-pharmacologic comfort measures Outcome: Progressing   Problem: Clinical Measurements: Goal: Will remain free from infection Outcome: Progressing   Problem: Nutrition: Goal: Adequate nutrition will be maintained Outcome: Progressing   Problem: Coping: Goal: Level of anxiety will decrease Outcome: Progressing

## 2022-10-25 NOTE — Plan of Care (Signed)
  Problem: Activity: Goal: Ability to avoid complications of mobility impairment will improve Outcome: Progressing   Problem: Pain Management: Goal: Pain level will decrease with appropriate interventions Outcome: Progressing   Problem: Skin Integrity: Goal: Will show signs of wound healing Outcome: Progressing

## 2022-10-26 DIAGNOSIS — I2510 Atherosclerotic heart disease of native coronary artery without angina pectoris: Secondary | ICD-10-CM | POA: Diagnosis not present

## 2022-10-26 DIAGNOSIS — Z85828 Personal history of other malignant neoplasm of skin: Secondary | ICD-10-CM | POA: Diagnosis not present

## 2022-10-26 DIAGNOSIS — Z9181 History of falling: Secondary | ICD-10-CM | POA: Diagnosis not present

## 2022-10-26 DIAGNOSIS — Z8616 Personal history of COVID-19: Secondary | ICD-10-CM | POA: Diagnosis not present

## 2022-10-26 DIAGNOSIS — I110 Hypertensive heart disease with heart failure: Secondary | ICD-10-CM | POA: Diagnosis not present

## 2022-10-26 DIAGNOSIS — Z471 Aftercare following joint replacement surgery: Secondary | ICD-10-CM | POA: Diagnosis not present

## 2022-10-26 DIAGNOSIS — D689 Coagulation defect, unspecified: Secondary | ICD-10-CM | POA: Diagnosis not present

## 2022-10-26 DIAGNOSIS — H269 Unspecified cataract: Secondary | ICD-10-CM | POA: Diagnosis not present

## 2022-10-26 DIAGNOSIS — Z8701 Personal history of pneumonia (recurrent): Secondary | ICD-10-CM | POA: Diagnosis not present

## 2022-10-26 DIAGNOSIS — N400 Enlarged prostate without lower urinary tract symptoms: Secondary | ICD-10-CM | POA: Diagnosis not present

## 2022-10-26 DIAGNOSIS — Z87440 Personal history of urinary (tract) infections: Secondary | ICD-10-CM | POA: Diagnosis not present

## 2022-10-26 DIAGNOSIS — E039 Hypothyroidism, unspecified: Secondary | ICD-10-CM | POA: Diagnosis not present

## 2022-10-26 DIAGNOSIS — I5043 Acute on chronic combined systolic (congestive) and diastolic (congestive) heart failure: Secondary | ICD-10-CM | POA: Diagnosis not present

## 2022-10-26 DIAGNOSIS — Z8551 Personal history of malignant neoplasm of bladder: Secondary | ICD-10-CM | POA: Diagnosis not present

## 2022-10-26 DIAGNOSIS — I447 Left bundle-branch block, unspecified: Secondary | ICD-10-CM | POA: Diagnosis not present

## 2022-10-26 DIAGNOSIS — I252 Old myocardial infarction: Secondary | ICD-10-CM | POA: Diagnosis not present

## 2022-10-26 DIAGNOSIS — F419 Anxiety disorder, unspecified: Secondary | ICD-10-CM | POA: Diagnosis not present

## 2022-10-26 DIAGNOSIS — K219 Gastro-esophageal reflux disease without esophagitis: Secondary | ICD-10-CM | POA: Diagnosis not present

## 2022-10-26 DIAGNOSIS — E785 Hyperlipidemia, unspecified: Secondary | ICD-10-CM | POA: Diagnosis not present

## 2022-10-26 DIAGNOSIS — Z96652 Presence of left artificial knee joint: Secondary | ICD-10-CM | POA: Diagnosis not present

## 2022-10-26 DIAGNOSIS — F4321 Adjustment disorder with depressed mood: Secondary | ICD-10-CM | POA: Diagnosis not present

## 2022-10-26 DIAGNOSIS — I499 Cardiac arrhythmia, unspecified: Secondary | ICD-10-CM | POA: Diagnosis not present

## 2022-10-26 DIAGNOSIS — I480 Paroxysmal atrial fibrillation: Secondary | ICD-10-CM | POA: Diagnosis not present

## 2022-10-26 DIAGNOSIS — M1990 Unspecified osteoarthritis, unspecified site: Secondary | ICD-10-CM | POA: Diagnosis not present

## 2022-10-26 NOTE — Anesthesia Postprocedure Evaluation (Signed)
Anesthesia Post Note  Patient: Nathaniel Ibarra  Procedure(s) Performed: COMPUTER ASSISTED TOTAL KNEE ARTHROPLASTY (Left: Knee)  Anesthesia Type: Spinal Anesthetic complications: no Comments: Patient discharged prior to re-evaluation  No notable events documented.   Last Vitals:  Vitals:   10/25/22 0801 10/25/22 1558  BP: (!) 172/71 (!) 170/69  Pulse: (!) 56 (!) 58  Resp:  16  Temp: (!) 36.4 C (!) 36.4 C  SpO2: 90% 91%    Last Pain:  Vitals:   10/25/22 0822  TempSrc:   PainSc: 0-No pain                 Alison Stalling

## 2022-10-29 DIAGNOSIS — Z9181 History of falling: Secondary | ICD-10-CM | POA: Diagnosis not present

## 2022-10-29 DIAGNOSIS — Z8551 Personal history of malignant neoplasm of bladder: Secondary | ICD-10-CM | POA: Diagnosis not present

## 2022-10-29 DIAGNOSIS — I2510 Atherosclerotic heart disease of native coronary artery without angina pectoris: Secondary | ICD-10-CM | POA: Diagnosis not present

## 2022-10-29 DIAGNOSIS — M1990 Unspecified osteoarthritis, unspecified site: Secondary | ICD-10-CM | POA: Diagnosis not present

## 2022-10-29 DIAGNOSIS — Z471 Aftercare following joint replacement surgery: Secondary | ICD-10-CM | POA: Diagnosis not present

## 2022-10-29 DIAGNOSIS — I5043 Acute on chronic combined systolic (congestive) and diastolic (congestive) heart failure: Secondary | ICD-10-CM | POA: Diagnosis not present

## 2022-10-29 DIAGNOSIS — E039 Hypothyroidism, unspecified: Secondary | ICD-10-CM | POA: Diagnosis not present

## 2022-10-29 DIAGNOSIS — N400 Enlarged prostate without lower urinary tract symptoms: Secondary | ICD-10-CM | POA: Diagnosis not present

## 2022-10-29 DIAGNOSIS — Z8701 Personal history of pneumonia (recurrent): Secondary | ICD-10-CM | POA: Diagnosis not present

## 2022-10-29 DIAGNOSIS — F4321 Adjustment disorder with depressed mood: Secondary | ICD-10-CM | POA: Diagnosis not present

## 2022-10-29 DIAGNOSIS — I499 Cardiac arrhythmia, unspecified: Secondary | ICD-10-CM | POA: Diagnosis not present

## 2022-10-29 DIAGNOSIS — I252 Old myocardial infarction: Secondary | ICD-10-CM | POA: Diagnosis not present

## 2022-10-29 DIAGNOSIS — E785 Hyperlipidemia, unspecified: Secondary | ICD-10-CM | POA: Diagnosis not present

## 2022-10-29 DIAGNOSIS — I447 Left bundle-branch block, unspecified: Secondary | ICD-10-CM | POA: Diagnosis not present

## 2022-10-29 DIAGNOSIS — Z96652 Presence of left artificial knee joint: Secondary | ICD-10-CM | POA: Diagnosis not present

## 2022-10-29 DIAGNOSIS — F419 Anxiety disorder, unspecified: Secondary | ICD-10-CM | POA: Diagnosis not present

## 2022-10-29 DIAGNOSIS — Z8616 Personal history of COVID-19: Secondary | ICD-10-CM | POA: Diagnosis not present

## 2022-10-29 DIAGNOSIS — D689 Coagulation defect, unspecified: Secondary | ICD-10-CM | POA: Diagnosis not present

## 2022-10-29 DIAGNOSIS — Z87440 Personal history of urinary (tract) infections: Secondary | ICD-10-CM | POA: Diagnosis not present

## 2022-10-29 DIAGNOSIS — I110 Hypertensive heart disease with heart failure: Secondary | ICD-10-CM | POA: Diagnosis not present

## 2022-10-29 DIAGNOSIS — H269 Unspecified cataract: Secondary | ICD-10-CM | POA: Diagnosis not present

## 2022-10-29 DIAGNOSIS — Z85828 Personal history of other malignant neoplasm of skin: Secondary | ICD-10-CM | POA: Diagnosis not present

## 2022-10-29 DIAGNOSIS — I480 Paroxysmal atrial fibrillation: Secondary | ICD-10-CM | POA: Diagnosis not present

## 2022-10-29 DIAGNOSIS — K219 Gastro-esophageal reflux disease without esophagitis: Secondary | ICD-10-CM | POA: Diagnosis not present

## 2022-11-07 DIAGNOSIS — M25562 Pain in left knee: Secondary | ICD-10-CM | POA: Diagnosis not present

## 2022-11-07 DIAGNOSIS — Z96652 Presence of left artificial knee joint: Secondary | ICD-10-CM | POA: Diagnosis not present

## 2022-11-09 DIAGNOSIS — M25562 Pain in left knee: Secondary | ICD-10-CM | POA: Diagnosis not present

## 2022-11-09 DIAGNOSIS — Z96652 Presence of left artificial knee joint: Secondary | ICD-10-CM | POA: Diagnosis not present

## 2022-11-11 IMAGING — DX DG CHEST 1V PORT
1 series · 1 of 1 positions shown · non-contrast
Comparison: December 28, 2020

CLINICAL DATA: Shortness of breath

EXAM:
PORTABLE CHEST 1 VIEW

[chest ap]
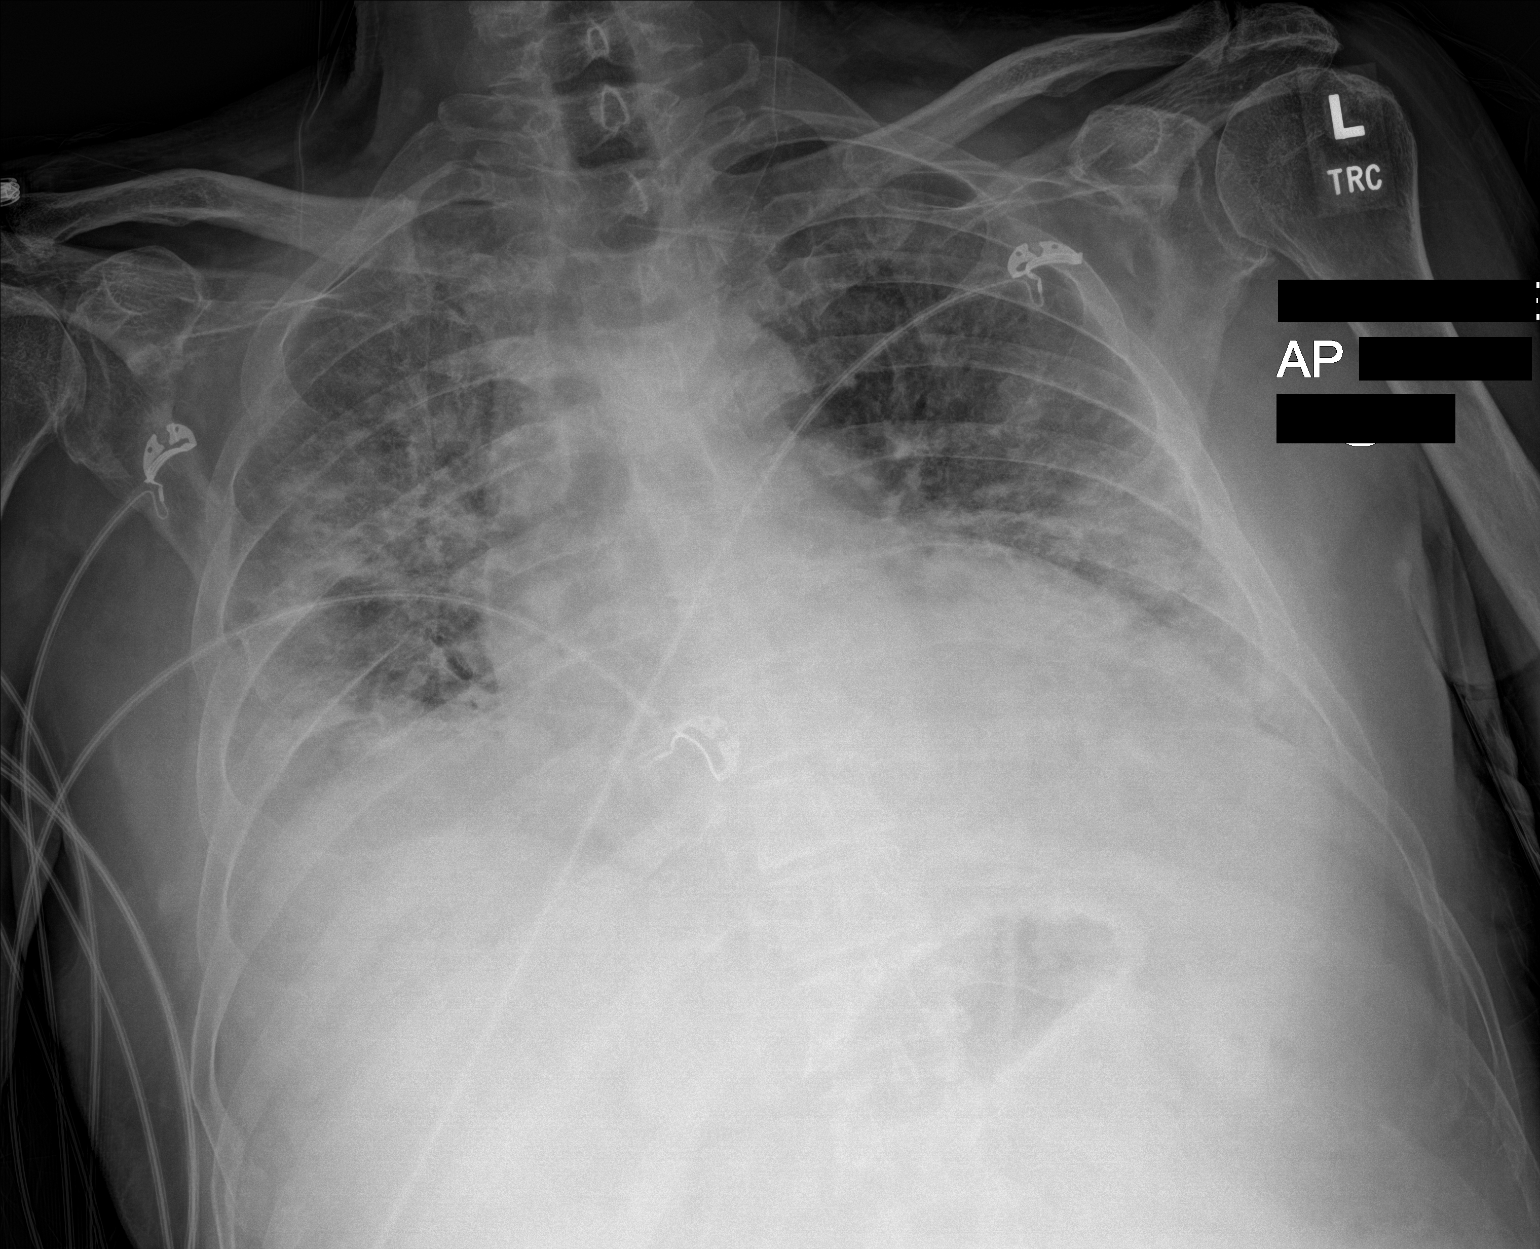

[1 of 1 positions shown; findings below may reference images not displayed]

FINDINGS: The lung volumes are low. There are worsening bilateral hazy
airspace opacities. There are probable developing pleural effusions.
The heart size remains enlarged but stable from prior study. There
is no pneumothorax.
IMPRESSION: Worsening bilateral hazy airspace opacities.

## 2022-11-14 DIAGNOSIS — M25562 Pain in left knee: Secondary | ICD-10-CM | POA: Diagnosis not present

## 2022-11-14 DIAGNOSIS — Z96652 Presence of left artificial knee joint: Secondary | ICD-10-CM | POA: Diagnosis not present

## 2022-11-17 DIAGNOSIS — Z96652 Presence of left artificial knee joint: Secondary | ICD-10-CM | POA: Diagnosis not present

## 2022-11-22 DIAGNOSIS — M25562 Pain in left knee: Secondary | ICD-10-CM | POA: Diagnosis not present

## 2022-11-22 DIAGNOSIS — Z96652 Presence of left artificial knee joint: Secondary | ICD-10-CM | POA: Diagnosis not present

## 2022-11-24 DIAGNOSIS — M25562 Pain in left knee: Secondary | ICD-10-CM | POA: Diagnosis not present

## 2022-11-24 DIAGNOSIS — Z96652 Presence of left artificial knee joint: Secondary | ICD-10-CM | POA: Diagnosis not present

## 2022-11-25 DIAGNOSIS — Z471 Aftercare following joint replacement surgery: Secondary | ICD-10-CM | POA: Diagnosis not present

## 2022-12-05 DIAGNOSIS — Z96652 Presence of left artificial knee joint: Secondary | ICD-10-CM | POA: Diagnosis not present

## 2022-12-05 DIAGNOSIS — M25562 Pain in left knee: Secondary | ICD-10-CM | POA: Diagnosis not present

## 2022-12-06 DIAGNOSIS — Z96652 Presence of left artificial knee joint: Secondary | ICD-10-CM | POA: Diagnosis not present

## 2022-12-27 DIAGNOSIS — I5022 Chronic systolic (congestive) heart failure: Secondary | ICD-10-CM | POA: Diagnosis not present

## 2022-12-27 DIAGNOSIS — R001 Bradycardia, unspecified: Secondary | ICD-10-CM | POA: Diagnosis not present

## 2022-12-27 DIAGNOSIS — I480 Paroxysmal atrial fibrillation: Secondary | ICD-10-CM | POA: Diagnosis not present

## 2022-12-27 DIAGNOSIS — I447 Left bundle-branch block, unspecified: Secondary | ICD-10-CM | POA: Diagnosis not present

## 2022-12-27 DIAGNOSIS — I10 Essential (primary) hypertension: Secondary | ICD-10-CM | POA: Diagnosis not present

## 2023-01-09 DIAGNOSIS — N400 Enlarged prostate without lower urinary tract symptoms: Secondary | ICD-10-CM | POA: Diagnosis not present

## 2023-01-09 DIAGNOSIS — D638 Anemia in other chronic diseases classified elsewhere: Secondary | ICD-10-CM | POA: Diagnosis not present

## 2023-01-09 DIAGNOSIS — I4891 Unspecified atrial fibrillation: Secondary | ICD-10-CM | POA: Diagnosis not present

## 2023-01-09 DIAGNOSIS — R609 Edema, unspecified: Secondary | ICD-10-CM | POA: Diagnosis not present

## 2023-01-09 DIAGNOSIS — M13862 Other specified arthritis, left knee: Secondary | ICD-10-CM | POA: Diagnosis not present

## 2023-01-09 DIAGNOSIS — E039 Hypothyroidism, unspecified: Secondary | ICD-10-CM | POA: Diagnosis not present

## 2023-01-09 DIAGNOSIS — G4700 Insomnia, unspecified: Secondary | ICD-10-CM | POA: Diagnosis not present

## 2023-01-09 DIAGNOSIS — R7303 Prediabetes: Secondary | ICD-10-CM | POA: Diagnosis not present

## 2023-01-09 DIAGNOSIS — F321 Major depressive disorder, single episode, moderate: Secondary | ICD-10-CM | POA: Diagnosis not present

## 2023-01-09 DIAGNOSIS — I2510 Atherosclerotic heart disease of native coronary artery without angina pectoris: Secondary | ICD-10-CM | POA: Diagnosis not present

## 2023-01-19 ENCOUNTER — Ambulatory Visit: Payer: PPO | Admitting: Podiatry

## 2023-01-28 ENCOUNTER — Other Ambulatory Visit: Payer: Self-pay | Admitting: Internal Medicine

## 2023-01-28 DIAGNOSIS — F321 Major depressive disorder, single episode, moderate: Secondary | ICD-10-CM

## 2023-02-06 ENCOUNTER — Telehealth: Payer: Self-pay | Admitting: *Deleted

## 2023-02-06 NOTE — Patient Outreach (Signed)
  Care Coordination   Initial Visit Note   02/06/2023 Name: Nathaniel Ibarra MRN: [id number] DOB: [birth year]  Nathaniel Ibarra is a 82 y.o. year old male who sees [name], MD for primary care. I spoke with  [name] by phone today.  What matters to the patients health and wellness today?  Patient declines and care coordination needs at this time but open to receiving information in mail for future purposes.      SDOH assessments and interventions completed:  No     Care Coordination Interventions:  Yes, provided   Follow up plan: No further intervention required.   Encounter Outcome:  Pt. Refused   Valente David, RN, MSN, [facility] Care Management Coordinator [phone]

## 2023-02-22 DIAGNOSIS — S79911A Unspecified injury of right hip, initial encounter: Secondary | ICD-10-CM | POA: Diagnosis not present

## 2023-02-22 DIAGNOSIS — S7001XA Contusion of right hip, initial encounter: Secondary | ICD-10-CM | POA: Diagnosis not present

## 2023-02-22 DIAGNOSIS — S7011XA Contusion of right thigh, initial encounter: Secondary | ICD-10-CM | POA: Diagnosis not present

## 2023-02-22 DIAGNOSIS — W108XXA Fall (on) (from) other stairs and steps, initial encounter: Secondary | ICD-10-CM | POA: Diagnosis not present

## 2023-02-26 ENCOUNTER — Other Ambulatory Visit: Payer: Self-pay | Admitting: Internal Medicine

## 2023-02-26 DIAGNOSIS — E039 Hypothyroidism, unspecified: Secondary | ICD-10-CM

## 2023-03-26 DIAGNOSIS — Z03818 Encounter for observation for suspected exposure to other biological agents ruled out: Secondary | ICD-10-CM | POA: Diagnosis not present

## 2023-03-26 DIAGNOSIS — J069 Acute upper respiratory infection, unspecified: Secondary | ICD-10-CM | POA: Diagnosis not present

## 2023-03-26 DIAGNOSIS — J019 Acute sinusitis, unspecified: Secondary | ICD-10-CM | POA: Diagnosis not present

## 2023-03-26 DIAGNOSIS — J45901 Unspecified asthma with (acute) exacerbation: Secondary | ICD-10-CM | POA: Diagnosis not present

## 2023-03-26 DIAGNOSIS — J209 Acute bronchitis, unspecified: Secondary | ICD-10-CM | POA: Diagnosis not present

## 2023-03-28 ENCOUNTER — Other Ambulatory Visit: Payer: Self-pay

## 2023-03-28 ENCOUNTER — Inpatient Hospital Stay
Admission: EM | Admit: 2023-03-28 | Discharge: 2023-04-03 | DRG: 871 | Disposition: A | Discharge status: Home / Self Care | Payer: PPO | Attending: Hospitalist | Admitting: Hospitalist

## 2023-03-28 ENCOUNTER — Emergency Department: Payer: PPO

## 2023-03-28 DIAGNOSIS — I5022 Chronic systolic (congestive) heart failure: Secondary | ICD-10-CM | POA: Diagnosis present

## 2023-03-28 DIAGNOSIS — Z8701 Personal history of pneumonia (recurrent): Secondary | ICD-10-CM

## 2023-03-28 DIAGNOSIS — I482 Chronic atrial fibrillation, unspecified: Secondary | ICD-10-CM | POA: Diagnosis present

## 2023-03-28 DIAGNOSIS — A419 Sepsis, unspecified organism: Secondary | ICD-10-CM | POA: Diagnosis not present

## 2023-03-28 DIAGNOSIS — F32A Depression, unspecified: Secondary | ICD-10-CM | POA: Diagnosis not present

## 2023-03-28 DIAGNOSIS — J189 Pneumonia, unspecified organism: Secondary | ICD-10-CM | POA: Diagnosis present

## 2023-03-28 DIAGNOSIS — D649 Anemia, unspecified: Secondary | ICD-10-CM | POA: Diagnosis present

## 2023-03-28 DIAGNOSIS — Z8551 Personal history of malignant neoplasm of bladder: Secondary | ICD-10-CM

## 2023-03-28 DIAGNOSIS — I700 Atherosclerosis of aorta: Secondary | ICD-10-CM | POA: Diagnosis not present

## 2023-03-28 DIAGNOSIS — Z9842 Cataract extraction status, left eye: Secondary | ICD-10-CM

## 2023-03-28 DIAGNOSIS — I110 Hypertensive heart disease with heart failure: Secondary | ICD-10-CM | POA: Diagnosis not present

## 2023-03-28 DIAGNOSIS — E039 Hypothyroidism, unspecified: Secondary | ICD-10-CM | POA: Diagnosis present

## 2023-03-28 DIAGNOSIS — Z7982 Long term (current) use of aspirin: Secondary | ICD-10-CM

## 2023-03-28 DIAGNOSIS — E785 Hyperlipidemia, unspecified: Secondary | ICD-10-CM | POA: Diagnosis present

## 2023-03-28 DIAGNOSIS — K219 Gastro-esophageal reflux disease without esophagitis: Secondary | ICD-10-CM | POA: Diagnosis present

## 2023-03-28 DIAGNOSIS — R6520 Severe sepsis without septic shock: Secondary | ICD-10-CM | POA: Diagnosis not present

## 2023-03-28 DIAGNOSIS — Z825 Family history of asthma and other chronic lower respiratory diseases: Secondary | ICD-10-CM

## 2023-03-28 DIAGNOSIS — Z96652 Presence of left artificial knee joint: Secondary | ICD-10-CM | POA: Diagnosis not present

## 2023-03-28 DIAGNOSIS — Z1152 Encounter for screening for COVID-19: Secondary | ICD-10-CM

## 2023-03-28 DIAGNOSIS — T380X5A Adverse effect of glucocorticoids and synthetic analogues, initial encounter: Secondary | ICD-10-CM | POA: Diagnosis present

## 2023-03-28 DIAGNOSIS — R7989 Other specified abnormal findings of blood chemistry: Secondary | ICD-10-CM | POA: Diagnosis not present

## 2023-03-28 DIAGNOSIS — I447 Left bundle-branch block, unspecified: Secondary | ICD-10-CM | POA: Diagnosis not present

## 2023-03-28 DIAGNOSIS — I252 Old myocardial infarction: Secondary | ICD-10-CM

## 2023-03-28 DIAGNOSIS — E860 Dehydration: Secondary | ICD-10-CM | POA: Diagnosis not present

## 2023-03-28 DIAGNOSIS — J129 Viral pneumonia, unspecified: Secondary | ICD-10-CM | POA: Diagnosis not present

## 2023-03-28 DIAGNOSIS — R918 Other nonspecific abnormal finding of lung field: Secondary | ICD-10-CM | POA: Diagnosis not present

## 2023-03-28 DIAGNOSIS — I480 Paroxysmal atrial fibrillation: Secondary | ICD-10-CM | POA: Diagnosis present

## 2023-03-28 DIAGNOSIS — Z9841 Cataract extraction status, right eye: Secondary | ICD-10-CM

## 2023-03-28 DIAGNOSIS — G479 Sleep disorder, unspecified: Secondary | ICD-10-CM | POA: Diagnosis not present

## 2023-03-28 DIAGNOSIS — I2510 Atherosclerotic heart disease of native coronary artery without angina pectoris: Secondary | ICD-10-CM | POA: Diagnosis present

## 2023-03-28 DIAGNOSIS — Z85828 Personal history of other malignant neoplasm of skin: Secondary | ICD-10-CM

## 2023-03-28 DIAGNOSIS — Z955 Presence of coronary angioplasty implant and graft: Secondary | ICD-10-CM

## 2023-03-28 DIAGNOSIS — Z961 Presence of intraocular lens: Secondary | ICD-10-CM | POA: Diagnosis not present

## 2023-03-28 DIAGNOSIS — Z8616 Personal history of COVID-19: Secondary | ICD-10-CM

## 2023-03-28 DIAGNOSIS — G9341 Metabolic encephalopathy: Secondary | ICD-10-CM | POA: Insufficient documentation

## 2023-03-28 DIAGNOSIS — Z79899 Other long term (current) drug therapy: Secondary | ICD-10-CM

## 2023-03-28 DIAGNOSIS — I10 Essential (primary) hypertension: Secondary | ICD-10-CM | POA: Diagnosis not present

## 2023-03-28 DIAGNOSIS — R0602 Shortness of breath: Secondary | ICD-10-CM | POA: Diagnosis not present

## 2023-03-28 DIAGNOSIS — Z79890 Hormone replacement therapy (postmenopausal): Secondary | ICD-10-CM

## 2023-03-28 DIAGNOSIS — J9601 Acute respiratory failure with hypoxia: Secondary | ICD-10-CM | POA: Diagnosis not present

## 2023-03-28 DIAGNOSIS — N400 Enlarged prostate without lower urinary tract symptoms: Secondary | ICD-10-CM | POA: Diagnosis present

## 2023-03-28 LAB — RESP PANEL BY RT-PCR (RSV, FLU A&B, COVID)  RVPGX2
Influenza A by PCR: NEGATIVE
Influenza B by PCR: NEGATIVE
Resp Syncytial Virus by PCR: NEGATIVE
SARS Coronavirus 2 by RT PCR: NEGATIVE

## 2023-03-28 LAB — URINALYSIS, ROUTINE W REFLEX MICROSCOPIC
Bacteria, UA: NONE SEEN
Bilirubin Urine: NEGATIVE
Glucose, UA: NEGATIVE mg/dL
Ketones, ur: NEGATIVE mg/dL
Leukocytes,Ua: NEGATIVE
Nitrite: NEGATIVE
Protein, ur: 30 mg/dL — AB
Specific Gravity, Urine: 1.02 (ref 1.005–1.030)
pH: 5 (ref 5.0–8.0)

## 2023-03-28 LAB — LACTIC ACID, PLASMA: Lactic Acid, Venous: 1.1 mmol/L (ref 0.5–1.9)

## 2023-03-28 LAB — COMPREHENSIVE METABOLIC PANEL
ALT: 87 U/L — ABNORMAL HIGH (ref 0–44)
AST: 79 U/L — ABNORMAL HIGH (ref 15–41)
Albumin: 3 g/dL — ABNORMAL LOW (ref 3.5–5.0)
Alkaline Phosphatase: 90 U/L (ref 38–126)
Anion gap: 5 (ref 5–15)
BUN: 24 mg/dL — ABNORMAL HIGH (ref 8–23)
CO2: 28 mmol/L (ref 22–32)
Calcium: 8.3 mg/dL — ABNORMAL LOW (ref 8.9–10.3)
Chloride: 103 mmol/L (ref 98–111)
Creatinine, Ser: 0.96 mg/dL (ref 0.61–1.24)
GFR, Estimated: >60 mL/min (ref >60)
Glucose, Bld: 168 mg/dL — ABNORMAL HIGH (ref 70–99)
Potassium: 4.5 mmol/L (ref 3.5–5.1)
Sodium: 136 mmol/L (ref 135–145)
Total Bilirubin: 0.7 mg/dL (ref 0.3–1.2)
Total Protein: 6.6 g/dL (ref 6.5–8.1)

## 2023-03-28 LAB — CBC WITH DIFFERENTIAL/PLATELET
Abs Immature Granulocytes: 0.34 10*3/uL — ABNORMAL HIGH (ref 0.00–0.07)
Basophils Absolute: 0.1 10*3/uL (ref 0.0–0.1)
Basophils Relative: 0 %
Eosinophils Absolute: 0 10*3/uL (ref 0.0–0.5)
Eosinophils Relative: 0 %
HCT: 37.7 % — ABNORMAL LOW (ref 39.0–52.0)
Hemoglobin: 11.9 g/dL — ABNORMAL LOW (ref 13.0–17.0)
Immature Granulocytes: 2 %
Lymphocytes Relative: 3 %
Lymphs Abs: 0.5 10*3/uL — ABNORMAL LOW (ref 0.7–4.0)
MCH: 29.7 pg (ref 26.0–34.0)
MCHC: 31.6 g/dL (ref 30.0–36.0)
MCV: 94 fL (ref 80.0–100.0)
Monocytes Absolute: 2 10*3/uL — ABNORMAL HIGH (ref 0.1–1.0)
Monocytes Relative: 12 %
Neutro Abs: 14.3 10*3/uL — ABNORMAL HIGH (ref 1.7–7.7)
Neutrophils Relative %: 83 %
Platelets: 372 10*3/uL (ref 150–400)
RBC: 4.01 MIL/uL — ABNORMAL LOW (ref 4.22–5.81)
RDW: 15 % (ref 11.5–15.5)
WBC: 17.2 10*3/uL — ABNORMAL HIGH (ref 4.0–10.5)
nRBC: 0 % (ref 0.0–0.2)

## 2023-03-28 LAB — EXPECTORATED SPUTUM ASSESSMENT W GRAM STAIN, RFLX TO RESP C

## 2023-03-28 LAB — PROCALCITONIN: Procalcitonin: <0.1 ng/mL

## 2023-03-28 LAB — BRAIN NATRIURETIC PEPTIDE: B Natriuretic Peptide: 512.2 pg/mL — ABNORMAL HIGH (ref 0.0–100.0)

## 2023-03-28 MED ORDER — ONDANSETRON HCL 4 MG/2ML IJ SOLN
4.0000 mg | Freq: Three times a day (TID) | INTRAMUSCULAR | Status: DC | PRN
  Administered 2023-04-01 – 2023-04-03 (×2): 4 mg via INTRAVENOUS
  Filled 2023-03-28 (×2): qty 2

## 2023-03-28 MED ORDER — TAMSULOSIN HCL 0.4 MG PO CAPS
0.8000 mg | ORAL_CAPSULE | Freq: Every day | ORAL | Status: DC
  Administered 2023-03-28: 0.8 mg via ORAL
  Filled 2023-03-28: qty 2

## 2023-03-28 MED ORDER — FINASTERIDE 5 MG PO TABS
5.0000 mg | ORAL_TABLET | Freq: Every day | ORAL | Status: DC
  Administered 2023-03-29 – 2023-04-03 (×6): 5 mg via ORAL
  Filled 2023-03-28 (×6): qty 1

## 2023-03-28 MED ORDER — ENOXAPARIN SODIUM 40 MG/0.4ML IJ SOSY
40.0000 mg | PREFILLED_SYRINGE | INTRAMUSCULAR | Status: DC
  Administered 2023-03-28 – 2023-04-02 (×6): 40 mg via SUBCUTANEOUS
  Filled 2023-03-28 (×7): qty 0.4

## 2023-03-28 MED ORDER — ASPIRIN 81 MG PO CHEW
81.0000 mg | CHEWABLE_TABLET | Freq: Every day | ORAL | Status: DC
  Administered 2023-03-29 – 2023-04-03 (×6): 81 mg via ORAL
  Filled 2023-03-28 (×7): qty 1

## 2023-03-28 MED ORDER — SPIRONOLACTONE 12.5 MG HALF TABLET
12.5000 mg | ORAL_TABLET | Freq: Every day | ORAL | Status: DC
  Filled 2023-03-28: qty 1

## 2023-03-28 MED ORDER — HYDRALAZINE HCL 20 MG/ML IJ SOLN
5.0000 mg | INTRAMUSCULAR | Status: DC | PRN
  Administered 2023-03-30 – 2023-03-31 (×2): 5 mg via INTRAVENOUS
  Filled 2023-03-28 (×2): qty 1

## 2023-03-28 MED ORDER — SODIUM CHLORIDE 0.9 % IV SOLN
500.0000 mg | INTRAVENOUS | Status: DC
  Administered 2023-03-28 – 2023-03-29 (×2): 500 mg via INTRAVENOUS
  Filled 2023-03-28: qty 500
  Filled 2023-03-28: qty 5

## 2023-03-28 MED ORDER — OXYCODONE HCL 5 MG PO TABS: 5.0000 mg | ORAL_TABLET | ORAL | Status: DC | PRN

## 2023-03-28 MED ORDER — AMIODARONE HCL 200 MG PO TABS
200.0000 mg | ORAL_TABLET | Freq: Every day | ORAL | Status: DC
  Administered 2023-03-29 – 2023-04-03 (×6): 200 mg via ORAL
  Filled 2023-03-28 (×7): qty 1

## 2023-03-28 MED ORDER — LEVOTHYROXINE SODIUM 50 MCG PO TABS
100.0000 ug | ORAL_TABLET | Freq: Every day | ORAL | Status: DC
  Administered 2023-03-29 – 2023-04-03 (×6): 100 ug via ORAL
  Filled 2023-03-28 (×6): qty 2

## 2023-03-28 MED ORDER — IBUPROFEN 400 MG PO TABS: 200.0000 mg | ORAL_TABLET | Freq: Four times a day (QID) | ORAL | Status: DC | PRN

## 2023-03-28 MED ORDER — ALBUTEROL SULFATE (2.5 MG/3ML) 0.083% IN NEBU
2.5000 mg | INHALATION_SOLUTION | Freq: Once | RESPIRATORY_TRACT | Status: AC
  Administered 2023-03-28: 2.5 mg via RESPIRATORY_TRACT
  Filled 2023-03-28: qty 3

## 2023-03-28 MED ORDER — ALBUTEROL SULFATE (2.5 MG/3ML) 0.083% IN NEBU: 3.0000 mL | INHALATION_SOLUTION | RESPIRATORY_TRACT | Status: DC | PRN

## 2023-03-28 MED ORDER — SPIRONOLACTONE 12.5 MG HALF TABLET
12.5000 mg | ORAL_TABLET | Freq: Every day | ORAL | Status: DC
  Administered 2023-03-29 – 2023-04-03 (×6): 12.5 mg via ORAL
  Filled 2023-03-28 (×6): qty 1

## 2023-03-28 MED ORDER — SODIUM CHLORIDE 0.9 % IV SOLN
2.0000 g | INTRAVENOUS | Status: AC
  Administered 2023-03-28 – 2023-04-01 (×5): 2 g via INTRAVENOUS
  Filled 2023-03-28 (×3): qty 2
  Filled 2023-03-28: qty 20
  Filled 2023-03-28: qty 2

## 2023-03-28 MED ORDER — DM-GUAIFENESIN ER 30-600 MG PO TB12
1.0000 | ORAL_TABLET | Freq: Two times a day (BID) | ORAL | Status: DC | PRN
  Administered 2023-03-31 – 2023-04-02 (×2): 1 via ORAL
  Filled 2023-03-28 (×3): qty 1

## 2023-03-28 MED ORDER — KETOTIFEN FUMARATE 0.035 % OP SOLN
1.0000 [drp] | Freq: Two times a day (BID) | OPHTHALMIC | Status: DC
  Administered 2023-03-28 – 2023-04-03 (×12): 1 [drp] via OPHTHALMIC
  Filled 2023-03-28: qty 5

## 2023-03-28 MED ORDER — ACETAMINOPHEN 325 MG PO TABS: 650.0000 mg | ORAL_TABLET | Freq: Four times a day (QID) | ORAL | Status: DC | PRN

## 2023-03-28 MED ORDER — TAMSULOSIN HCL 0.4 MG PO CAPS: 0.8000 mg | ORAL_CAPSULE | Freq: Two times a day (BID) | ORAL | Status: DC

## 2023-03-28 NOTE — ED Provider Notes (Signed)
[facility] Provider Note    Event Date/Time   First MD Initiated Contact with Patient 03/28/23 1236     (approximate)   History   Shortness of Breath   HPI  Nathaniel Ibarra is a 82 y.o. male history of CHF does not smoke recently diagnosed with bronchitis as an outpatient few days ago placed on prednisone and antitussive medication for presents to the ER for worsening shortness of breath malaise and productive cough.  Patient found to be hypoxic on room air requiring supplemental oxygen he does not wear home oxygen.     Physical Exam   Triage Vital Signs: ED Triage Vitals [03/28/23 1045]  Enc Vitals Group     BP (!) 153/80     Pulse Rate 76     Resp 18     Temp (!) 100.5 F (38.1 C)     Temp Source Oral     SpO2 92 %     Weight 195 lb 15.8 oz (88.9 kg)     Height 6' (1.829 m)     Head Circumference      Peak Flow      Pain Score 0     Pain Loc      Pain Edu?      Excl. in GC?     Most recent vital signs: Vitals:   03/28/23 1045  BP: (!) 153/80  Pulse: 76  Resp: 18  Temp: (!) 100.5 F (38.1 C)  SpO2: 92%     Constitutional: Alert  Eyes: Conjunctivae are normal.  Head: Atraumatic. Nose: No congestion/rhinnorhea. Mouth/Throat: Mucous membranes are moist.   Neck: Painless ROM.  Cardiovascular:   Good peripheral circulation. Respiratory: Mild tachypnea with diffuse scattered wheeze and rhonchi. Gastrointestinal: Soft and nontender.  Musculoskeletal:  no deformity Neurologic:  MAE spontaneously. No gross focal neurologic deficits are appreciated.  Skin:  Skin is warm, dry and intact. No rash noted. Psychiatric: Mood and affect are normal. Speech and behavior are normal.    ED Results / Procedures / Treatments   Labs (all labs ordered are listed, but only abnormal results are displayed) Labs Reviewed  COMPREHENSIVE METABOLIC PANEL - Abnormal; Notable for the following components:      Result Value   Glucose, Bld 168 (*)     BUN 24 (*)    Calcium 8.3 (*)    Albumin 3.0 (*)    AST 79 (*)    ALT 87 (*)    All other components within normal limits  CBC WITH DIFFERENTIAL/PLATELET - Abnormal; Notable for the following components:   WBC 17.2 (*)    RBC 4.01 (*)    Hemoglobin 11.9 (*)    HCT 37.7 (*)    Neutro Abs 14.3 (*)    Lymphs Abs 0.5 (*)    Monocytes Absolute 2.0 (*)    Abs Immature Granulocytes 0.34 (*)    All other components within normal limits  RESP PANEL BY RT-PCR (RSV, FLU A&B, COVID)  RVPGX2  CULTURE, BLOOD (ROUTINE X 2)  CULTURE, BLOOD (ROUTINE X 2)  LACTIC ACID, PLASMA  LACTIC ACID, PLASMA  URINALYSIS, ROUTINE W REFLEX MICROSCOPIC  PROTIME-INR     EKG  ED ECG REPORT I, Willy Eddy, the attending physician, personally viewed and interpreted this ECG.   Date: 03/28/2023  EKG Time: 10:49  Rate: 75  Rhythm: sinus  Axis: left  Intervals: lbbb  ST&T Change: no stemi, nonspecific t wave abn    RADIOLOGY Please  see ED Course for my review and interpretation.  I personally reviewed all radiographic images ordered to evaluate for the above acute complaints and reviewed radiology reports and findings.  These findings were personally discussed with the patient.  Please see medical record for radiology report.    PROCEDURES:  Critical Care performed: Yes, see critical care procedure note(s)  .Critical Care  Performed by: Willy Eddy, MD Authorized by: Willy Eddy, MD   Critical care provider statement:    Critical care time (minutes):  35   Critical care was necessary to treat or prevent imminent or life-threatening deterioration of the following conditions:  Sepsis   Critical care was time spent personally by me on the following activities:  Ordering and performing treatments and interventions, ordering and review of laboratory studies, ordering and review of radiographic studies, pulse oximetry, re-evaluation of patient's condition, review of old charts,  obtaining history from patient or surrogate, examination of patient, evaluation of patient's response to treatment, discussions with primary provider, discussions with consultants and development of treatment plan with patient or surrogate    MEDICATIONS ORDERED IN ED: Medications  cefTRIAXone (ROCEPHIN) 2 g in sodium chloride 0.9 % 100 mL IVPB (has no administration in time range)  azithromycin (ZITHROMAX) 500 mg in sodium chloride 0.9 % 250 mL IVPB (has no administration in time range)  albuterol (PROVENTIL) (2.5 MG/3ML) 0.083% nebulizer solution 2.5 mg (has no administration in time range)     IMPRESSION / MDM / ASSESSMENT AND PLAN / ED COURSE  I reviewed the triage vital signs and the nursing notes.                              Differential diagnosis includes, but is not limited to, Asthma, copd, CHF, pna, ptx, malignancy, Pe, anemia  Patient presenting to the ER for evaluation of symptoms as described above.  Based on symptoms, risk factors and considered above differential, this presenting complaint could reflect a potentially life-threatening illness therefore the patient will be placed on continuous pulse oximetry and telemetry for monitoring.  Laboratory evaluation will be sent to evaluate for the above complaints.     Clinical Course as of 03/28/23 1253  Tue Mar 28, 2023  1252 Given the patient's acute respiratory failure with hypoxia requiring supplemental oxygen to feel he will require hospitalization.  I have ordered broad-spectrum antibiotics.  Will give nebulizer for the wheezing.  Viral panel is negative.  Does not seem consistent with CHF. [PR]  1252 Will consult hospitalist for admission. [PR]    Clinical Course User Index [PR] Willy Eddy, MD     FINAL CLINICAL IMPRESSION(S) / ED DIAGNOSES   Final diagnoses:  Sepsis with acute hypoxic respiratory failure without septic shock, due to unspecified organism     Rx / DC Orders   ED Discharge Orders      None        Note:  This document was prepared using Dragon voice recognition software and may include unintentional dictation errors.    Willy Eddy, MD 03/28/23 1253

## 2023-03-28 NOTE — Sepsis Progress Note (Signed)
Elink following code sepsis °

## 2023-03-28 NOTE — ED Triage Notes (Signed)
KC reports that pt was seen on Sunday, and returned to [facility] today not feeling better and with congestion, pt was found to be hypoxic in the office at 85% and placed on 3L

## 2023-03-28 NOTE — Progress Notes (Signed)
CODE SEPSIS - PHARMACY COMMUNICATION  **Broad Spectrum Antibiotics should be administered within 1 hour of Sepsis diagnosis**  Time Code Sepsis Called/Page Received: 1238  Antibiotics Ordered: ceftriaxone 2 grams and azithromycin 500 mg  Time of 1st antibiotic administration: 1346  Additional action taken by pharmacy: messaged  If necessary, Name of Provider/Nurse Contacted: RN    Elliot Gurney, PharmD, BCPS Clinical Pharmacist  03/28/2023 12:41 PM

## 2023-03-28 NOTE — ED Triage Notes (Signed)
Pt here with SOB. Pt has wheezing and was hypoxic at [facility], 85% on RA. Pt does not wear oxygen at home. Pt was placed on 3L BNC at [facility]. Pt's son states pt was 81% at home. Pt denies pain. Pt has also been complaining of weakness and coughing.

## 2023-03-28 NOTE — H&P (Addendum)
History and Physical    Nathaniel Ibarra:[id number] DOB: February 17, 1941 DOA: 03/28/2023  Referring MD/NP/PA:   PCP: Sherron Monday, MD   Patient coming from:  The patient is coming from home.    Chief Complaint: Shortness of breath  HPI: Nathaniel Ibarra is a 82 y.o. male with medical history significant of hypertension, hyperlipidemia, CAD, DES placement, CHF with EF of 25 to 30%, atrial fibrillation (off Eliuis), hypothyroidism, depression, BPH, who presents with shortness of breath.  Patient has SOB for almost a week, which has been progressively worsening.  Patient has cough with yellow-colored sputum production, fever and chills.  His temperature is currently 100.5 ED. Patient was seen in walk-in clinic on 4/7, and was given prescription of Augmentin and prednisone for possible bronchitis.  Patient symptoms have not improved.  He continues to have productive cough, shortness of breath.  Denies chest pain.  No nausea, vomiting, diarrhea or abdominal pain.  No symptoms of UTI.  Patient also reports right eye itchy and watery, slightly painful in the past several days. No vision loss.  Patient is not using oxygen normally, was found to have oxygen desaturation to 85% on room air in ED.  Currently patient is on 3L of oxygen with saturation 92-95% when I saw pt in ED.   Data reviewed independently and ED Course: pt was found to have negative PCR for COVID, flu and RSV, WBC 17.2, GFR> 60, BNP 512, abnormal liver function (ALP 90, AST 79, ALT 87, total bilirubin 0.7), temperature 100.5, blood pressure 153/80, heart rate 76, RR 18.  Chest x-ray showed infiltration in right lower lobe and right upper lobe.  Patient is admitted to telemetry bed as inpatient.  CXR: Peribronchial opacities within the posterior right lower lobe and right upper lobe compatible with pneumonia.   EKG: I have personally reviewed.  Sinus rhythm, QTc 448, old left bundle blockade, LAD, poor R wave  progression.   Review of Systems:   General: no fevers, chills, no body weight gain, has fatigue. Has itchy right eye HEENT: no blurry vision, hearing changes or sore throat Respiratory: has dyspnea, coughing, no wheezing CV: no chest pain, no palpitations GI: no nausea, vomiting, abdominal pain, diarrhea, constipation GU: no dysuria, burning on urination, increased urinary frequency, hematuria  Ext: has trace leg edema Neuro: no unilateral weakness, numbness, or tingling, no vision change or hearing loss Skin: no rash, no skin tear. MSK: No muscle spasm, no deformity, no limitation of range of movement in spin Heme: No easy bruising.  Travel history: No recent long distant travel.   Allergy: No Known Allergies  Past Medical History:  Diagnosis Date   Aortic atherosclerosis (HCC)    Basal cell carcinoma 05/18/2022   Left inf cheek - EDC   Bladder cancer (HCC) 2008   BPH (benign prostatic hyperplasia)    Bradycardia    Cardiomyopathy (HCC)    a.) TTE 08/17/2015: EF 55%; b.) LHC 09/01/2015: EF 70%; c.) LHC 11/14/2017: EF >55%; d.) TTE 11/15/2017: EF 45%; e.) MPI 08/05/2019: EF 45%; f.) TTE 05/17/2020: EF 25-30%; g.) TTE 07/16/2020: EF >55%; h.) TTE 03/02/2022: EF >55%   Cataract, bilateral    Coronary artery disease    a.) questionable NSTEMI 2016; b.) LHC/PCI 09/01/2015: 80% mLAD (2.75 x 15 mm Xience Alpine DES), 50% m-dLCx, 50% dLCx; c.) LHC 11/14/2017: 30% oD1-D1, 40% pLCx, 40% mLAD -- med mgmt   Depression    GERD (gastroesophageal reflux disease)    HFrEF (heart failure  with reduced ejection fraction) (HCC)    a.) TTE 08/17/2015: EF 55%, anteroseptal HK, triv PR/TR, G1DD; b.) TTE 11/15/2017: EF 45%, mild LAE, mild MR, mod RVE; c.) TTE 05/17/2020: EF 25-30%, glob HK, mod LV dil, mild LVH, mild BAE, mod MR, triv AR; d.) TTE 07/16/2020: EF >55%, mild LVH, triv MR, mild TR/PR; e.) TTE 03/02/2022: EF >55%, mild LVH, LAE, mild MR/TR/PR, G1DD   History of 2019 novel coronavirus  disease (COVID-19)    a.) 12/2020; b.) 08/04/2022   HLD (hyperlipidemia)    Hypertension    Hypothyroidism    LBBB (left bundle branch block)    Long term current use of amiodarone    NSTEMI (non-ST elevated myocardial infarction) (HCC) 2016   Osteoarthritis    Paroxysmal atrial fibrillation (HCC)    a.) CHA2DS2VASc = 5 (age x 2, HFrEF, HTN, previous MI);  b.) rate/rhythm maintained on oral amiodarone; no chronic anticoagulation (discontinued due to cost, medical noncompliance, falls)   Peripheral edema    Pneumonia due to COVID-19 virus 12/2020   Sleep difficulties    a.) uses trazodone PRN   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   L mid dorsum forearm - ED&C    Past Surgical History:  Procedure Laterality Date   CARDIAC CATHETERIZATION N/A 09/01/2015   Procedure: Left Heart Cath and Coronary Angiography;  Surgeon: Laurier NancyShaukat A Khan, MD;  Location: [facility];  Service: Cardiovascular;  Laterality: N/A;   CARDIAC CATHETERIZATION N/A 09/01/2015   Procedure: Coronary Stent Intervention;  Surgeon: Alwyn Peawayne D Callwood, MD;  Location: [facility];  Service: Cardiovascular;  Laterality: N/A;   CATARACT EXTRACTION W/PHACO Left 11/23/2016   Procedure: CATARACT EXTRACTION PHACO AND INTRAOCULAR LENS PLACEMENT (IOC);  Surgeon: Lockie Molahadwick Brasington, MD;  [facility];  Service: Ophthalmology;  Laterality: Left;   CATARACT EXTRACTION W/PHACO Right 02/01/2017   Procedure: CATARACT EXTRACTION PHACO AND INTRAOCULAR LENS PLACEMENT (IOC) Complicated  right toric lens;  Surgeon: Lockie Molahadwick Brasington, MD;  [facility];  Service: Ophthalmology;  Laterality: Right;  Malyugin toric Lens   KNEE ARTHROPLASTY Left 10/24/2022   Procedure: COMPUTER ASSISTED TOTAL KNEE ARTHROPLASTY;  Surgeon: Donato HeinzHooten, James P, MD;  Location: [facility];  Service: Orthopedics;  Laterality: Left;   LEFT HEART CATH AND CORONARY ANGIOGRAPHY Right 11/14/2017   Procedure: LEFT HEART  CATH AND CORONARY ANGIOGRAPHY;  Surgeon: Laurier NancyKhan, Shaukat A, MD;  Location: [facility];  Service: Cardiovascular;  Laterality: Right;   SCROTAL EXPLORATION Left 11/24/2020   Procedure: SCROTUM EXPLORATION WITH LEFT ORCHIECTOMY;  Surgeon: Noel ChristmasPace, Maryellen D, MD;  Location: [facility];  Service: Urology;  Laterality: Left;   TONSILLECTOMY      Social History:  reports that he has never smoked. He has never used smokeless tobacco. He reports that he does not drink alcohol and does not use drugs.  Family History:  Family History  Problem Relation Age of Onset   Emphysema Mother    Emphysema Father      Prior to Admission medications   Medication Sig Start Date End Date Taking? Authorizing Provider  acetaminophen (TYLENOL) 325 MG tablet Take 2 tablets (650 mg total) by mouth every 6 (six) hours as needed for mild pain or headache (fever >/= 101). Patient not taking: Reported on 10/24/2022 01/13/21   Lonia BloodMcClung, Jeffrey T, MD  albuterol (VENTOLIN HFA) 108 (90 Base) MCG/ACT inhaler Inhale 1 puff into the lungs every 4 (four) hours as needed. 10/27/21   [provider]  amiodarone (PACERONE) 200 MG tablet Take 1 tablet (200 mg total) by mouth daily. 05/18/20   Alford Highland, MD  celecoxib (CELEBREX) 200 MG capsule Take 1 capsule (200 mg total) by mouth 2 (two) times daily. 10/25/22   Lasandra Beech B, PA-C  enoxaparin (LOVENOX) 40 MG/0.4ML injection Inject 0.4 mLs (40 mg total) into the skin daily for 14 days. 10/25/22 11/08/22  Lasandra Beech B, PA-C  finasteride (PROSCAR) 5 MG tablet Take 1 tablet (5 mg total) by mouth daily. 05/18/20   Alford Highland, MD  levothyroxine (SYNTHROID) 100 MCG tablet TAKE 1 TABLET BY MOUTH EVERY DAY IN THE MORNING 02/27/23   Sherron Monday, MD  oxyCODONE (OXY IR/ROXICODONE) 5 MG immediate release tablet Take 1 tablet (5 mg total) by mouth every 4 (four) hours as needed for severe pain. 10/25/22   Lasandra Beech B, PA-C  PARoxetine (PAXIL) 20 MG tablet TAKE  1 TABLET BY MOUTH EVERY DAY IN THE MORNING Erythematous/11/24   Sherron Monday, MD  spironolactone (ALDACTONE) 25 MG tablet Take 0.5 tablets (12.5 mg total) by mouth daily. 05/19/20   Alford Highland, MD  tamsulosin (FLOMAX) 0.4 MG CAPS capsule Take 0.8 mg by mouth at bedtime.     [provider]    Physical Exam: Vitals:   03/28/23 1045 03/28/23 1600  BP: (!) 153/80 (!) 149/62  Pulse: 76 68  Resp: 18 20  Temp: (!) 100.5 F (38.1 C) 98.1 F (36.7 C)  TempSrc: Oral Oral  SpO2: 92% 94%  Weight: 88.9 kg   Height: 6' (1.829 m)    General: Not in acute distress HEENT: Has watery right eye with mild erythematous conjunctiva       Eyes: PERRL, EOMI, no scleral icterus.       ENT: No discharge from the ears and nose, no pharynx injection, no tonsillar enlargement.        Neck: No JVD, no bruit, no mass felt. Heme: No neck lymph node enlargement. Cardiac: S1/S2, RRR, No murmurs, No gallops or rubs. Respiratory: Has coarse breathing sound bilaterally, no wheezing on my auscultation GI: Soft, nondistended, nontender, no rebound pain, no organomegaly, BS present. GU: No hematuria Ext: has trace leg edema bilaterally. 1+DP/PT pulse bilaterally. Musculoskeletal: No joint deformities, No joint redness or warmth, no limitation of ROM in spin. Skin: No rashes.  Neuro: Alert, oriented X3, cranial nerves II-XII grossly intact, moves all extremities normally.  Psych: Patient is not psychotic, no suicidal or hemocidal ideation.  Labs on Admission: I have personally reviewed following labs and imaging studies  CBC: Recent Labs  Lab 03/28/23 1049  WBC 17.2*  NEUTROABS 14.3*  HGB 11.9*  HCT 37.7*  MCV 94.0  PLT 372   Basic Metabolic Panel: Recent Labs  Lab 03/28/23 1049  NA 136  K 4.5  CL 103  CO2 28  GLUCOSE 168*  BUN 24*  CREATININE 0.96  CALCIUM 8.3*   GFR: Estimated Creatinine Clearance: 66.2 mL/min (by C-G formula based on SCr of 0.96 mg/dL). Liver Function  Tests: Recent Labs  Lab 03/28/23 1049  AST 79*  ALT 87*  ALKPHOS 90  BILITOT 0.7  PROT 6.6  ALBUMIN 3.0*   No results for input(s): "LIPASE", "AMYLASE" in the last 168 hours. No results for input(s): "AMMONIA" in the last 168 hours. Coagulation Profile: No results for input(s): "INR", "PROTIME" in the last 168 hours. Cardiac Enzymes: No results for input(s): "CKTOTAL", "CKMB", "CKMBINDEX", "TROPONINI" in the last 168 hours. BNP (last 3 results) No  results for input(s): "PROBNP" in the last 8760 hours. HbA1C: No results for input(s): "HGBA1C" in the last 72 hours. CBG: No results for input(s): "GLUCAP" in the last 168 hours. Lipid Profile: No results for input(s): "CHOL", "HDL", "LDLCALC", "TRIG", "CHOLHDL", "LDLDIRECT" in the last 72 hours. Thyroid Function Tests: No results for input(s): "TSH", "T4TOTAL", "FREET4", "T3FREE", "THYROIDAB" in the last 72 hours. Anemia Panel: No results for input(s): "VITAMINB12", "FOLATE", "FERRITIN", "TIBC", "IRON", "RETICCTPCT" in the last 72 hours. Urine analysis:    Component Value Date/Time   COLORURINE YELLOW (A) 03/28/2023 1325   APPEARANCEUR CLEAR (A) 03/28/2023 1325   LABSPEC 1.020 03/28/2023 1325   PHURINE 5.0 03/28/2023 1325   GLUCOSEU NEGATIVE 03/28/2023 1325   HGBUR MODERATE (A) 03/28/2023 1325   BILIRUBINUR NEGATIVE 03/28/2023 1325   KETONESUR NEGATIVE 03/28/2023 1325   PROTEINUR 30 (A) 03/28/2023 1325   NITRITE NEGATIVE 03/28/2023 1325   LEUKOCYTESUR NEGATIVE 03/28/2023 1325   Sepsis Labs: @LABRCNTIP (procalcitonin:4,lacticidven:4) ) Recent Results (from the past 240 hour(s))  Resp panel by RT-PCR (RSV, Flu A&B, Covid) Anterior Nasal Swab     Status: None   Collection Time: 03/28/23 10:50 AM   Specimen: Anterior Nasal Swab  Result Value Ref Range Status   SARS Coronavirus 2 by RT PCR NEGATIVE NEGATIVE Final    Comment: (NOTE) SARS-CoV-2 target nucleic acids are NOT DETECTED.  The SARS-CoV-2 RNA is generally  detectable in upper respiratory specimens during the acute phase of infection. The lowest concentration of SARS-CoV-2 viral copies this assay can detect is 138 copies/mL. A negative result does not preclude SARS-Cov-2 infection and should not be used as the sole basis for treatment or other patient management decisions. A negative result may occur with  improper specimen collection/handling, submission of specimen other than nasopharyngeal swab, presence of viral mutation(s) within the areas targeted by this assay, and inadequate number of viral copies(<138 copies/mL). A negative result must be combined with clinical observations, patient history, and epidemiological information. The expected result is Negative.  Fact Sheet for Patients:  BloggerCourse.com  Fact Sheet for Healthcare Providers:  SeriousBroker.it  This test is no t yet approved or cleared by the [place] FDA and  has been authorized for detection and/or diagnosis of SARS-CoV-2 by FDA under an Emergency Use Authorization (EUA). This EUA will remain  in effect (meaning this test can be used) for the duration of the COVID-19 declaration under Section 564(b)(1) of the Act, 21 U.S.C.section 360bbb-3(b)(1), unless the authorization is terminated  or revoked sooner.       Influenza A by PCR NEGATIVE NEGATIVE Final   Influenza B by PCR NEGATIVE NEGATIVE Final    Comment: (NOTE) The Xpert Xpress SARS-CoV-2/FLU/RSV plus assay is intended as an aid in the diagnosis of influenza from Nasopharyngeal swab specimens and should not be used as a sole basis for treatment. Nasal washings and aspirates are unacceptable for Xpert Xpress SARS-CoV-2/FLU/RSV testing.  Fact Sheet for Patients: BloggerCourse.com  Fact Sheet for Healthcare Providers: SeriousBroker.it  This test is not yet approved or cleared by the [place] FDA  and has been authorized for detection and/or diagnosis of SARS-CoV-2 by FDA under an Emergency Use Authorization (EUA). This EUA will remain in effect (meaning this test can be used) for the duration of the COVID-19 declaration under Section 564(b)(1) of the Act, 21 U.S.C. section 360bbb-3(b)(1), unless the authorization is terminated or revoked.     Resp Syncytial Virus by PCR NEGATIVE NEGATIVE Final    Comment: (NOTE) Fact Sheet  for Patients: BloggerCourse.com  Fact Sheet for Healthcare Providers: SeriousBroker.it  This test is not yet approved or cleared by the [place] FDA and has been authorized for detection and/or diagnosis of SARS-CoV-2 by FDA under an Emergency Use Authorization (EUA). This EUA will remain in effect (meaning this test can be used) for the duration of the COVID-19 declaration under Section 564(b)(1) of the Act, 21 U.S.C. section 360bbb-3(b)(1), unless the authorization is terminated or revoked.  Performed at [facility], [address], [place], [place] [address]      Radiological Exams on Admission: DG Chest 2 View  Result Date: 03/28/2023 CLINICAL DATA:  Suspected sepsis.  Hypoxic. EXAM: CHEST - 2 VIEW COMPARISON:  12/28/2020 FINDINGS: Stable cardiomediastinal contours. Asymmetric elevation of the right hemidiaphragm identified. Peribronchial opacities identified within the posterior right lower lobe and right upper lobe. Left lung appears clear. No pleural effusion or edema. Visualized osseous structures are unremarkable. IMPRESSION: Peribronchial opacities within the posterior right lower lobe and right upper lobe compatible with pneumonia. Electronically Signed   By: Signa Kell M.D.   On: 03/28/2023 11:25      Assessment/Plan Principal Problem:   CAP (community acquired pneumonia) Active Problems:   Sepsis   Atrial fibrillation, chronic   Chronic HFrEF (heart failure with  reduced ejection fraction)   Coronary artery disease   BPH (benign prostatic hyperplasia)   Essential hypertension   Hypothyroidism   Depression   Abnormal LFTs   Assessment and Plan:  Sepsis due to CAP (community acquired pneumonia): X-ray showed infiltration in RLL and RUL. Patient has 3 L new oxygen requirement, but no acute respiratory distress.  Patient has sepsis with WBC 17.2, fever 100.5.  Lactic acid is normal 1.1.  - Will admit to tele bed  as inpt - IV Rocephin and azithromycin, - Mucinex for cough  - Bronchodilators - Urine legionella and S. pneumococcal antigen - Follow up blood culture x2, sputum culture - will get Procalcitonin - IVF: will give IVF bolus due to normal lactic acid level and CHF with EF 25-30%  Atrial fibrillation, chronic: Patient stopped taking Eliquis.  Heart rate is 76 -Amiodarone  Chronic HFrEF (heart failure with reduced ejection fraction): 2D echo on 05/17/2020 showed EF of 25-30%.  BNP 512, but patient does not have JVD or leg edema, does not seem to have CHF exacerbation -Continue spironolactone 12.5 mg daily  Coronary artery disease -aspirin 81 mg daily (patient states that he is taking baby aspirin at home)  BPH (benign prostatic hyperplasia) -Flomax and Proscar  Essential hypertension -IV hydralazine as needed -Patient is on spironolactone  Hypothyroidism -Synthroid  Depression -Continue home medications  Abnormal liver function: Likely due to ongoing infection and sepsis. -Avoid using Tylenol -Check hepatitis panel    Right eye itchy: Etiology is not clear.  Patient is already on 2 antibiotics for pneumonia which should cover any infection. -Empiric ketotifen eye drop bid   DVT ppx: SQ Lovenox  Code Status: Full code per pt  Family Communication: Yes, patient's wife by phone  Disposition Plan:  Anticipate discharge back to previous environment  Consults called:  none  Admission status and Level of care:  Telemetry Medical:    as inpt      Dispo: The patient is from: Home              Anticipated d/c is to: Home              Anticipated d/c date is: 2  days              Patient currently is not medically stable to d/c.    Severity of Illness:  The appropriate patient status for this patient is INPATIENT. Inpatient status is judged to be reasonable and necessary in order to provide the required intensity of service to ensure the patient's safety. The patient's presenting symptoms, physical exam findings, and initial radiographic and laboratory data in the context of their chronic comorbidities is felt to place them at high risk for further clinical deterioration. Furthermore, it is not anticipated that the patient will be medically stable for discharge from the hospital within 2 midnights of admission.   * I certify that at the point of admission it is my clinical judgment that the patient will require inpatient hospital care spanning beyond 2 midnights from the point of admission due to high intensity of service, high risk for further deterioration and high frequency of surveillance required.*       Date of Service 03/28/2023    Lorretta Harp [facility]   If 7PM-7AM, please contact night-coverage [email or web address] 03/28/2023, 5:15 PM

## 2023-03-29 ENCOUNTER — Inpatient Hospital Stay: Payer: PPO

## 2023-03-29 DIAGNOSIS — I480 Paroxysmal atrial fibrillation: Secondary | ICD-10-CM

## 2023-03-29 DIAGNOSIS — G9341 Metabolic encephalopathy: Secondary | ICD-10-CM | POA: Insufficient documentation

## 2023-03-29 DIAGNOSIS — I2510 Atherosclerotic heart disease of native coronary artery without angina pectoris: Secondary | ICD-10-CM

## 2023-03-29 DIAGNOSIS — A419 Sepsis, unspecified organism: Secondary | ICD-10-CM | POA: Diagnosis not present

## 2023-03-29 DIAGNOSIS — R7989 Other specified abnormal findings of blood chemistry: Secondary | ICD-10-CM

## 2023-03-29 DIAGNOSIS — J189 Pneumonia, unspecified organism: Secondary | ICD-10-CM | POA: Diagnosis not present

## 2023-03-29 DIAGNOSIS — D649 Anemia, unspecified: Secondary | ICD-10-CM | POA: Insufficient documentation

## 2023-03-29 LAB — CBC
HCT: 37.3 % — ABNORMAL LOW (ref 39.0–52.0)
Hemoglobin: 11.5 g/dL — ABNORMAL LOW (ref 13.0–17.0)
MCH: 29 pg (ref 26.0–34.0)
MCHC: 30.8 g/dL (ref 30.0–36.0)
MCV: 94 fL (ref 80.0–100.0)
Platelets: 330 10*3/uL (ref 150–400)
RBC: 3.97 MIL/uL — ABNORMAL LOW (ref 4.22–5.81)
RDW: 15 % (ref 11.5–15.5)
WBC: 17.4 10*3/uL — ABNORMAL HIGH (ref 4.0–10.5)
nRBC: 0 % (ref 0.0–0.2)

## 2023-03-29 LAB — GLUCOSE, CAPILLARY: Glucose-Capillary: 233 mg/dL — ABNORMAL HIGH (ref 70–99)

## 2023-03-29 LAB — HEPATITIS PANEL, ACUTE
HCV Ab: NONREACTIVE
Hep A IgM: NONREACTIVE
Hep B C IgM: NONREACTIVE
Hepatitis B Surface Ag: NONREACTIVE

## 2023-03-29 LAB — CULTURE, RESPIRATORY W GRAM STAIN

## 2023-03-29 MED ORDER — SODIUM CHLORIDE 0.9 % IV SOLN
100.0000 mg | Freq: Two times a day (BID) | INTRAVENOUS | Status: AC
  Administered 2023-03-29 – 2023-04-01 (×7): 100 mg via INTRAVENOUS
  Filled 2023-03-29 (×7): qty 100

## 2023-03-29 MED ORDER — IPRATROPIUM-ALBUTEROL 0.5-2.5 (3) MG/3ML IN SOLN
3.0000 mL | Freq: Four times a day (QID) | RESPIRATORY_TRACT | Status: DC
  Administered 2023-03-29 – 2023-03-31 (×8): 3 mL via RESPIRATORY_TRACT
  Filled 2023-03-29 (×8): qty 3

## 2023-03-29 MED ORDER — TAMSULOSIN HCL 0.4 MG PO CAPS
0.8000 mg | ORAL_CAPSULE | Freq: Every day | ORAL | Status: DC
  Administered 2023-03-29 – 2023-04-02 (×5): 0.8 mg via ORAL
  Filled 2023-03-29 (×5): qty 2

## 2023-03-29 MED ORDER — METHYLPREDNISOLONE SODIUM SUCC 40 MG IJ SOLR
40.0000 mg | Freq: Two times a day (BID) | INTRAMUSCULAR | Status: DC
  Administered 2023-03-29 – 2023-03-30 (×2): 40 mg via INTRAVENOUS
  Filled 2023-03-29 (×3): qty 1

## 2023-03-29 MED ORDER — IPRATROPIUM-ALBUTEROL 0.5-2.5 (3) MG/3ML IN SOLN
3.0000 mL | RESPIRATORY_TRACT | Status: DC | PRN
  Administered 2023-03-29: 3 mL via RESPIRATORY_TRACT
  Filled 2023-03-29 (×2): qty 3

## 2023-03-29 MED ORDER — ALBUTEROL SULFATE (2.5 MG/3ML) 0.083% IN NEBU
2.5000 mg | INHALATION_SOLUTION | RESPIRATORY_TRACT | Status: DC | PRN
  Administered 2023-03-29: 2.5 mg via RESPIRATORY_TRACT
  Filled 2023-03-29: qty 3

## 2023-03-29 NOTE — Assessment & Plan Note (Signed)
Continue low dose aspirin 

## 2023-03-29 NOTE — Assessment & Plan Note (Signed)
Mild, in the setting of amiodarone use, sepsis - Trend LFTs

## 2023-03-29 NOTE — Plan of Care (Signed)

## 2023-03-29 NOTE — Assessment & Plan Note (Addendum)
At baseline the patient is interactive and alert, here he is somnolent and sluggish due to his infection. - Standard delirium precautions: blinds open and lights on during day, TV off, minimize interruptions at night, glasses/hearing aids, PT/OT, avoiding Beers list medications

## 2023-03-29 NOTE — TOC Progression Note (Signed)
Transition of Care [facility]) - Progression Note    Patient Details  Name: Nathaniel Ibarra MRN: [id number] Date of Birth: 26-May-1941  [facility] CM/SW Contact  Marlowe Sax, RN Phone Number: 03/29/2023, 2:49 PM  Clinical Narrative:     The patient from home with wife On acute Oxygen Desaturated to 76% on RA while ambulating 12 feet to the bathroom PT to evaluate the patient TOC to continue to follow for needs and assist with DC planning    Barriers to Discharge: Continued Medical Work up  Expected Discharge Plan and Services   Discharge Planning Services: CM Consult   Living arrangements for the past 2 months: Single Family Home                                       Social Determinants of Health (SDOH) Interventions SDOH Screenings   Food Insecurity: No Food Insecurity (03/28/2023)  Housing: Low Risk  (03/28/2023)  Transportation Needs: No Transportation Needs (03/28/2023)  Utilities: Not At Risk (03/28/2023)  Tobacco Use: Low Risk  (10/24/2022)    Readmission Risk Interventions    11/24/2020    1:44 PM  Readmission Risk Prevention Plan  Post Dischage Appt Complete  Medication Screening Complete  Transportation Screening Complete

## 2023-03-29 NOTE — Assessment & Plan Note (Signed)
-   Continue finasteride, Flomax

## 2023-03-29 NOTE — Assessment & Plan Note (Addendum)
Baseline EF 25 to 30%. Appears euvolemic to dehydrated, not on Lasix at baseline - Continue spironolactone, amiodarone

## 2023-03-29 NOTE — Assessment & Plan Note (Signed)
Presented with leukocytosis, fever, encephalopathy, respiratory distress and respiratory failure due to pneumonia.

## 2023-03-29 NOTE — Assessment & Plan Note (Signed)
-   Hold Paxil

## 2023-03-29 NOTE — Hospital Course (Signed)
Nathaniel Ibarra is an 82 y.o. M with sCHF EF 25-30%, CAD s/p PCI remote, pAF on aspirin only due to cost, LBBB, HTN, hypothyroidism, depression, and BPH who presented with cough congestion malaise, found to have sepsis due to CAP, hypoxic requiring 3 L of new O2 requirement.

## 2023-03-29 NOTE — Assessment & Plan Note (Addendum)
-   Follow sputum culture - Continue Rocephin and azithromycin - Trend Procalcitonin   He has no history of COPD or asthma, but his wheezing terribly, and has had some chronic lung disease since his bad COVID 2 years ago - Start scheduled bronchodilators - Start Solu-Medrol

## 2023-03-29 NOTE — Assessment & Plan Note (Signed)
Hemoglobin stable relative to baseline - Trend hemoglobin

## 2023-03-29 NOTE — Progress Notes (Addendum)
   03/29/23 1119  Mobility  Activity Ambulated with assistance to bathroom  Level of Assistance Standby assist, set-up cues, supervision of patient - no hands on  Assistive Device None  Distance Ambulated (ft) 12 ft  Activity Response Tolerated well  $Mobility charge 1 Mobility   MS responding to bed alarm. Pt standing EOB upon entry with [place] removed from nose (unsure how long it was removed), hanging from the side of the bed. Pt requesting to use the bathroom-- impulsively starts to amb to the bathroom without [place]. Pt amb to/from the bathroom SBA, returned EOB while MS checked O2. Pt O2 76% once returning from the bathroom, placed back on NS and O2 at 93%. Pt left supine with alarm set and needs within reach. Wife present at bedside.  Zetta Bills Mobility Specialist 03/29/23 11:23 AM

## 2023-03-29 NOTE — Assessment & Plan Note (Signed)
Blood pressure somewhat elevated - Continue spironolactone - Continue as needed hydralazine

## 2023-03-29 NOTE — Progress Notes (Signed)
  Progress Note   Patient: ASPEN JHA [id number]:[id number] DOB: 1941-02-28 DOA: 03/28/2023     1 DOS: the patient was seen and examined on 03/29/2023 at 9:59 AM      Brief hospital course: Mr. Zumalt is an 82 y.o. M with sCHF EF 25-30%, CAD s/p PCI remote, pAF on aspirin only due to cost, LBBB, HTN, hypothyroidism, depression, and BPH who presented with cough congestion malaise, found to have sepsis due to CAP, hypoxic requiring 3 L of new O2 requirement.        Assessment and Plan: * Sepsis Presented with leukocytosis, fever, encephalopathy, respiratory distress and respiratory failure due to pneumonia.  CAP (community acquired pneumonia) - Follow sputum culture - Continue Rocephin and azithromycin - Trend Procalcitonin   He has no history of COPD or asthma, but his wheezing terribly, and has had some chronic lung disease since his bad COVID 2 years ago - Start scheduled bronchodilators - Start Solu-Medrol    Acute metabolic encephalopathy At baseline the patient is interactive and alert, here he is somnolent and sluggish due to his infection. - Standard delirium precautions: blinds open and lights on during day, TV off, minimize interruptions at night, glasses/hearing aids, PT/OT, avoiding Beers list medications    Normocytic anemia Hemoglobin stable relative to baseline - Trend hemoglobin  Abnormal LFTs Mild, in the setting of amiodarone use, sepsis - Trend LFTs  Depression - Hold Paxil  Paroxysmal atrial fibrillation Rate controlled, not on anticoagulation at home - Continue amiodarone, low-dose aspirin  Chronic systolic CHF (congestive heart failure) Baseline EF 25 to 30%. Appears euvolemic to dehydrated, not on Lasix at baseline - Continue spironolactone, amiodarone  Hypothyroidism - Continue levothyroxine  Essential hypertension Blood pressure somewhat elevated - Continue spironolactone - Continue as needed hydralazine  BPH (benign prostatic  hyperplasia) - Continue finasteride, Flomax  Coronary artery disease - Continue low-dose aspirin          Subjective: Patient is coughing up a lot of mucus, breathing fast, wheezing very bad     Physical Exam: BP (!) 155/68 (BP Location: Right Arm)   Pulse 66   Temp 98.8 F (37.1 C)   Resp 18   Ht 6' (1.829 m)   Wt 89 kg   SpO2 95%   BMI 26.61 kg/m   Elderly adult male, lying in bed, appears debilitated, sluggish RRR, no murmurs, no peripheral edema Respiratory rate increased, paradoxical breathing, wheezing bilaterally, no rales, overall diminished Abdomen distended but soft, no tenderness palpation or guarding Attention diminished, affect blunted, judgment insight appear impaired, face symmetric, speech fluent, severe generalized weakness    Data Reviewed: Patient metabolic panel unremarkable CBC shows mild anemia, white blood cell count 17 Chest x-ray shows bronchitic changes, patchy opacities bilaterally    Family Communication: Wife and son at the bedside    Disposition: Status is: Inpatient The patient presented with sepsis from pneumonia  He is still quite sick, will need continued ongoing IV antibiotics          Author: Alberteen Sam, MD 03/29/2023 5:40 PM  For on call review [email or web address].

## 2023-03-29 NOTE — Assessment & Plan Note (Signed)
Rate controlled, not on anticoagulation at home - Continue amiodarone, low-dose aspirin

## 2023-03-29 NOTE — Assessment & Plan Note (Signed)
Continue levothyroxine 

## 2023-03-30 ENCOUNTER — Encounter: Payer: Self-pay | Admitting: Internal Medicine

## 2023-03-30 DIAGNOSIS — A419 Sepsis, unspecified organism: Secondary | ICD-10-CM | POA: Diagnosis not present

## 2023-03-30 LAB — RESPIRATORY PANEL BY PCR

## 2023-03-30 LAB — CBC
HCT: 36 % — ABNORMAL LOW (ref 39.0–52.0)
Hemoglobin: 11.1 g/dL — ABNORMAL LOW (ref 13.0–17.0)
MCH: 29 pg (ref 26.0–34.0)
MCHC: 30.8 g/dL (ref 30.0–36.0)
MCV: 94 fL (ref 80.0–100.0)
Platelets: 345 10*3/uL (ref 150–400)
RBC: 3.83 MIL/uL — ABNORMAL LOW (ref 4.22–5.81)
RDW: 15 % (ref 11.5–15.5)
WBC: 13.6 10*3/uL — ABNORMAL HIGH (ref 4.0–10.5)
nRBC: 0 % (ref 0.0–0.2)

## 2023-03-30 LAB — COMPREHENSIVE METABOLIC PANEL
ALT: 103 U/L — ABNORMAL HIGH (ref 0–44)
AST: 47 U/L — ABNORMAL HIGH (ref 15–41)
Albumin: 2.6 g/dL — ABNORMAL LOW (ref 3.5–5.0)
Alkaline Phosphatase: 88 U/L (ref 38–126)
Anion gap: 7 (ref 5–15)
BUN: 20 mg/dL (ref 8–23)
CO2: 29 mmol/L (ref 22–32)
Calcium: 8.5 mg/dL — ABNORMAL LOW (ref 8.9–10.3)
Chloride: 101 mmol/L (ref 98–111)
Creatinine, Ser: 0.78 mg/dL (ref 0.61–1.24)
GFR, Estimated: >60 mL/min (ref >60)
Glucose, Bld: 167 mg/dL — ABNORMAL HIGH (ref 70–99)
Potassium: 4.4 mmol/L (ref 3.5–5.1)
Sodium: 137 mmol/L (ref 135–145)
Total Bilirubin: 0.5 mg/dL (ref 0.3–1.2)
Total Protein: 6.1 g/dL — ABNORMAL LOW (ref 6.5–8.1)

## 2023-03-30 LAB — GLUCOSE, CAPILLARY: Glucose-Capillary: 187 mg/dL — ABNORMAL HIGH (ref 70–99)

## 2023-03-30 LAB — CULTURE, RESPIRATORY W GRAM STAIN

## 2023-03-30 LAB — CULTURE, BLOOD (ROUTINE X 2)

## 2023-03-30 MED ORDER — PREDNISONE 20 MG PO TABS
40.0000 mg | ORAL_TABLET | Freq: Every day | ORAL | Status: DC
  Administered 2023-03-31 – 2023-04-02 (×3): 40 mg via ORAL
  Filled 2023-03-30 (×3): qty 2

## 2023-03-30 NOTE — Plan of Care (Signed)
  Problem: Respiratory: Goal: Ability to maintain adequate ventilation will improve Outcome: Progressing   Problem: Activity: Goal: Risk for activity intolerance will decrease Outcome: Progressing   Problem: Elimination: Goal: Will not experience complications related to bowel motility Outcome: Progressing   Problem: Safety: Goal: Ability to remain free from injury will improve Outcome: Progressing   Problem: Skin Integrity: Goal: Risk for impaired skin integrity will decrease Outcome: Progressing   Problem: Pain Managment: Goal: General experience of comfort will improve Outcome: Progressing

## 2023-03-30 NOTE — Plan of Care (Signed)

## 2023-03-30 NOTE — Progress Notes (Signed)
PROGRESS NOTE    RUSHAWN POLLEY  [id number]:[id number] DOB: Feb 05, 1941 DOA: 03/28/2023 PCP: Sherron Monday, MD  160A/160A-AA  LOS: 2 days   Brief hospital course:   Assessment & Plan: Mr. Perdomo is an 82 y.o. M with sCHF EF 25-30%, CAD s/p PCI remote, pAF on aspirin only due to cost, LBBB, HTN, hypothyroidism, depression, and BPH who presented with cough congestion malaise, found to have sepsis due to CAP, hypoxic requiring 3 L of new O2 requirement.   * Sepsis Presented with leukocytosis, fever, encephalopathy, respiratory distress and respiratory failure due to pneumonia.   Acute hypoxemic respiratory failure --desat down to 76% with walking.  Needs 3L currently.  Due to Coronavirus viral infection and likely 2ndary bacterial infection. --no history of COPD or asthma, but has had some chronic lung disease since his bad COVID 2 years ago --pt was started on IV solumedrol for wheezing Plan: --transition to prednisone 40 mg daily tomorrow --cont DuoNeb scheduled --Continue supplemental O2 to keep sats >=90%, wean as tolerated  CAP (community acquired pneumonia) --cont ceftriaxone and azithromycin  Coronavirus viral PNA --not covid.   --supportive care    Acute metabolic encephalopathy At baseline the patient is interactive and alert, here he is somnolent and sluggish due to his infection. - Standard delirium precautions: blinds open and lights on during day, TV off, minimize interruptions at night, glasses/hearing aids, PT/OT, avoiding Beers list medications     Normocytic anemia Hemoglobin stable relative to baseline - Trend hemoglobin   Abnormal LFTs Mild, in the setting of amiodarone use, sepsis - Trend LFTs   Depression - not taking Paxil PTA   Paroxysmal atrial fibrillation Rate controlled, not on anticoagulation at home - Continue amiodarone, low-dose aspirin   Chronic systolic CHF (congestive heart failure) Baseline EF 25 to 30%. Appears euvolemic to  dehydrated, not on Lasix at baseline - Continue spironolactone   Hypothyroidism - Continue levothyroxine   Essential hypertension Blood pressure somewhat elevated - Continue spironolactone - Continue as needed hydralazine   BPH (benign prostatic hyperplasia) - Continue finasteride, Flomax   Coronary artery disease - Continue low-dose aspirin   DVT prophylaxis: Lovenox SQ Code Status: Full code  Family Communication: wife updated on the phone today Level of care: Telemetry Medical Dispo:   The patient is from: home Anticipated d/c is to: home Anticipated d/c date is: 2-3 days   Subjective and Interval History:  Pt reported breathing improved, coughing up sputum, but still needed 3L O2.   Objective: Vitals:   03/30/23 0303 03/30/23 0500 03/30/23 0825 03/30/23 1700  BP:   (!) 176/67 (!) 147/76  Pulse:   (!) 58 63  Resp:   18 20  Temp:   98.1 F (36.7 C) 97.6 F (36.4 C)  TempSrc:   Oral Oral  SpO2: 91%  96% 93%  Weight:  88 kg    Height:        Intake/Output Summary (Last 24 hours) at 03/30/2023 1849 Last data filed at 03/30/2023 1510 Gross per 24 hour  Intake 674.46 ml  Output 1000 ml  Net -325.54 ml   Filed Weights   03/28/23 1045 03/29/23 0500 03/30/23 0500  Weight: 88.9 kg 89 kg 88 kg    Examination:   Constitutional: NAD, AAOx3 HEENT: conjunctivae and lids normal, EOMI CV: No cyanosis.   RESP: normal respiratory effort, no wheezes, cough with breathing, on 3L Neuro: II - XII grossly intact.   Psych: Normal mood and affect.  Appropriate judgement and  reason   Data Reviewed: I have personally reviewed labs and imaging studies  Time spent: 50 minutes  Darlin Priestly, MD [facility] If 7PM-7AM, please contact night-coverage 03/30/2023, 6:49 PM

## 2023-03-31 DIAGNOSIS — A419 Sepsis, unspecified organism: Secondary | ICD-10-CM | POA: Diagnosis not present

## 2023-03-31 LAB — CULTURE, BLOOD (ROUTINE X 2): Special Requests: ADEQUATE

## 2023-03-31 LAB — CBC
HCT: 35.5 % — ABNORMAL LOW (ref 39.0–52.0)
Hemoglobin: 11.1 g/dL — ABNORMAL LOW (ref 13.0–17.0)
MCH: 29.1 pg (ref 26.0–34.0)
MCHC: 31.3 g/dL (ref 30.0–36.0)
MCV: 92.9 fL (ref 80.0–100.0)
Platelets: 376 10*3/uL (ref 150–400)
RBC: 3.82 MIL/uL — ABNORMAL LOW (ref 4.22–5.81)
RDW: 15.1 % (ref 11.5–15.5)
WBC: 22.3 10*3/uL — ABNORMAL HIGH (ref 4.0–10.5)
nRBC: 0 % (ref 0.0–0.2)

## 2023-03-31 LAB — BASIC METABOLIC PANEL
Anion gap: 10 (ref 5–15)
BUN: 28 mg/dL — ABNORMAL HIGH (ref 8–23)
CO2: 28 mmol/L (ref 22–32)
Calcium: 8.3 mg/dL — ABNORMAL LOW (ref 8.9–10.3)
Chloride: 99 mmol/L (ref 98–111)
Creatinine, Ser: 0.98 mg/dL (ref 0.61–1.24)
GFR, Estimated: >60 mL/min (ref >60)
Glucose, Bld: 208 mg/dL — ABNORMAL HIGH (ref 70–99)
Potassium: 4.6 mmol/L (ref 3.5–5.1)
Sodium: 137 mmol/L (ref 135–145)

## 2023-03-31 LAB — CULTURE, RESPIRATORY W GRAM STAIN

## 2023-03-31 LAB — MAGNESIUM: Magnesium: 2.1 mg/dL (ref 1.7–2.4)

## 2023-03-31 MED ORDER — IPRATROPIUM-ALBUTEROL 0.5-2.5 (3) MG/3ML IN SOLN
3.0000 mL | Freq: Three times a day (TID) | RESPIRATORY_TRACT | Status: DC
  Administered 2023-03-31 – 2023-04-03 (×9): 3 mL via RESPIRATORY_TRACT
  Filled 2023-03-31 (×9): qty 3

## 2023-03-31 NOTE — Care Management Important Message (Signed)
Important Message  Patient Details  Name: Nathaniel Ibarra MRN: [id number] Date of Birth: Jul 12, 1941   Medicare Important Message Given:  Yes     Olegario Messier A [name] 03/31/2023, 2:51 PM

## 2023-03-31 NOTE — Progress Notes (Signed)
PROGRESS NOTE    Nathaniel Ibarra  [id number] DOB: [birth year] DOA: 03/28/2023 PCP: Sherron Monday, MD  160A/160A-AA  LOS: 3 days   Brief hospital course:   Assessment & Plan: Mr. Humble is an 82 y.o. M with sCHF EF 25-30%, CAD s/p PCI remote, pAF on aspirin only due to cost, LBBB, HTN, hypothyroidism, depression, and BPH who presented with cough congestion malaise, found to have sepsis due to CAP, hypoxic requiring 3 L of new O2 requirement.   * Sepsis Presented with leukocytosis, fever, encephalopathy, respiratory distress and respiratory failure due to pneumonia.   Acute hypoxemic respiratory failure --desat down to 76% with walking.  Needs 3L currently.  Due to Coronavirus viral infection and likely 2ndary bacterial infection. --no history of COPD or asthma, but has had some chronic lung disease since his bad COVID 2 years ago --pt was started on IV solumedrol for wheezing, transitioned to prednisone Plan: --cont prednisone 40 mg daily --cont DuoNeb scheduled --Continue supplemental O2 to keep sats >=90%, wean as tolerated  CAP (community acquired pneumonia) --cont ceftriaxone and azithromycin  Coronavirus viral PNA --not covid.   --supportive care  --standard precautions   Acute metabolic encephalopathy, resolved At baseline the patient is interactive and alert, here he is somnolent and sluggish due to his infection. - Standard delirium precautions: blinds open and lights on during day, TV off, minimize interruptions at night, glasses/hearing aids, PT/OT, avoiding Beers list medications     Normocytic anemia Hemoglobin stable relative to baseline   Abnormal LFTs Mild, in the setting of amiodarone use, sepsis   Depression - not taking Paxil PTA   Paroxysmal atrial fibrillation Rate controlled, not on anticoagulation at home - Continue amiodarone, low-dose aspirin   Chronic systolic CHF (congestive heart failure) Baseline EF 25 to 30%. Appears  euvolemic to dehydrated, not on Lasix at baseline - Continue spironolactone   Hypothyroidism - Continue levothyroxine   Essential hypertension Blood pressure somewhat elevated - Continue spironolactone - Continue as needed hydralazine   BPH (benign prostatic hyperplasia) - Continue finasteride, Flomax   Coronary artery disease - Continue low-dose aspirin   DVT prophylaxis: Lovenox SQ Code Status: Full code  Family Communication:  Level of care: Telemetry Medical Dispo:   The patient is from: home Anticipated d/c is to: home Anticipated d/c date is: 1-2 days   Subjective and Interval History:  Pt reported breathing continued to improve.     Objective: Vitals:   03/31/23 0747 03/31/23 0748 03/31/23 1336 03/31/23 1559  BP: (!) 167/80   (!) 160/69  Pulse: 63   62  Resp: 18   18  Temp:    98.5 F (36.9 C)  TempSrc:      SpO2: 94% 95% 97% 92%  Weight:      Height:        Intake/Output Summary (Last 24 hours) at 03/31/2023 1904 Last data filed at 03/31/2023 0645 Gross per 24 hour  Intake --  Output 450 ml  Net -450 ml   Filed Weights   03/29/23 0500 03/30/23 0500 03/31/23 0230  Weight: 89 kg 88 kg 91.8 kg    Examination:   Constitutional: NAD, AAOx3 HEENT: conjunctivae and lids normal, EOMI CV: No cyanosis.   RESP: normal respiratory effort, on 2L Neuro: II - XII grossly intact.   Psych: Normal mood and affect.  Appropriate judgement and reason   Data Reviewed: I have personally reviewed labs and imaging studies  Time spent: 35 minutes  Darlin Priestly, MD  [facility] If 7PM-7AM, please contact night-coverage 03/31/2023, 7:04 PM

## 2023-03-31 NOTE — Plan of Care (Signed)

## 2023-04-01 DIAGNOSIS — A419 Sepsis, unspecified organism: Secondary | ICD-10-CM | POA: Diagnosis not present

## 2023-04-01 LAB — CBC
HCT: 35.5 % — ABNORMAL LOW (ref 39.0–52.0)
Hemoglobin: 10.9 g/dL — ABNORMAL LOW (ref 13.0–17.0)
MCH: 28.5 pg (ref 26.0–34.0)
MCHC: 30.7 g/dL (ref 30.0–36.0)
MCV: 92.7 fL (ref 80.0–100.0)
Platelets: 402 10*3/uL — ABNORMAL HIGH (ref 150–400)
RBC: 3.83 MIL/uL — ABNORMAL LOW (ref 4.22–5.81)
RDW: 15.4 % (ref 11.5–15.5)
WBC: 20.4 10*3/uL — ABNORMAL HIGH (ref 4.0–10.5)
nRBC: 0.2 % (ref 0.0–0.2)

## 2023-04-01 LAB — BASIC METABOLIC PANEL
Anion gap: 9 (ref 5–15)
BUN: 23 mg/dL (ref 8–23)
CO2: 29 mmol/L (ref 22–32)
Calcium: 8.2 mg/dL — ABNORMAL LOW (ref 8.9–10.3)
Chloride: 101 mmol/L (ref 98–111)
Creatinine, Ser: 0.79 mg/dL (ref 0.61–1.24)
GFR, Estimated: >60 mL/min (ref >60)
Glucose, Bld: 108 mg/dL — ABNORMAL HIGH (ref 70–99)
Potassium: 4.2 mmol/L (ref 3.5–5.1)
Sodium: 139 mmol/L (ref 135–145)

## 2023-04-01 LAB — CULTURE, BLOOD (ROUTINE X 2): Culture: NO GROWTH

## 2023-04-01 LAB — MAGNESIUM: Magnesium: 2.2 mg/dL (ref 1.7–2.4)

## 2023-04-01 MED ORDER — FUROSEMIDE 10 MG/ML IJ SOLN
40.0000 mg | Freq: Once | INTRAMUSCULAR | Status: AC
  Administered 2023-04-01: 40 mg via INTRAVENOUS
  Filled 2023-04-01: qty 4

## 2023-04-01 NOTE — Plan of Care (Signed)
AOX4, Noted desaturation when up to the bathroom at room air, down to 79%. Oxygen back at 3LPM when back to bed. Will continue to monitor O2 Sats and wean off O2 as appropriate. Instructed to call RN/NT if needed for anything as well as bathroom anytime.

## 2023-04-01 NOTE — Progress Notes (Addendum)
Noted desaturation when up to the bathroom at room air, went down to 79% with inspiratory wheezing, placed back to 02 at 3LPM to catch up in bed after potty. Patient is back on bed, with pulse ox in placed reading 90-94% at 3LPM. Left O2 at 3LPM before leaving the room and will continue to monitor O2 saturation to wean O2 requirement as appropriate.   Attending attestation: Pt will need 3L supplemental oxygen at discharge.

## 2023-04-01 NOTE — Plan of Care (Signed)
  Problem: Activity: Goal: Ability to tolerate increased activity will improve Outcome: Progressing   Problem: Respiratory: Goal: Ability to maintain adequate ventilation will improve Outcome: Progressing   Problem: Education: Goal: Knowledge of General Education information will improve Description: Including pain rating scale, medication(s)/side effects and non-pharmacologic comfort measures Outcome: Progressing   Problem: Health Behavior/Discharge Planning: Goal: Ability to manage health-related needs will improve Outcome: Progressing   Problem: Activity: Goal: Risk for activity intolerance will decrease Outcome: Progressing   Problem: Nutrition: Goal: Adequate nutrition will be maintained Outcome: Progressing   Problem: Elimination: Goal: Will not experience complications related to bowel motility Outcome: Progressing

## 2023-04-01 NOTE — Progress Notes (Signed)
PROGRESS NOTE    Nathaniel Ibarra  [id number]:[id number] DOB: June 02, 1941 DOA: 03/28/2023 PCP: Sherron Monday, MD  160A/160A-AA  LOS: 4 days   Brief hospital course:   Assessment & Plan: [name] is an 82 y.o. M with sCHF EF 25-30%, CAD s/p PCI remote, pAF on aspirin only due to cost, LBBB, HTN, hypothyroidism, depression, and BPH who presented with cough congestion malaise, found to have sepsis due to CAP, hypoxic requiring 3 L of new O2 requirement.   * Sepsis Presented with leukocytosis, fever, encephalopathy, respiratory distress and respiratory failure due to pneumonia.   Acute hypoxemic respiratory failure --desat down to 76% with walking.  Needs 3L currently.  Due to Coronavirus viral infection and likely 2ndary bacterial infection. --no history of COPD or asthma, but has had some chronic lung disease since his bad COVID 2 years ago --pt was started on IV solumedrol for wheezing, transitioned to prednisone Plan: --cont prednisone 40 mg daily --cont DuoNeb scheduled --Continue supplemental O2 to keep sats >=90%, wean as tolerated --trial IV lasix 40 mg x1 today   CAP (community acquired pneumonia) --cont ceftriaxone and azithromycin for a 5-day course  Leukocytosis --WBC trending up, likely due to steroid use  Coronavirus viral PNA --not covid.   --supportive care  --standard precautions   Acute metabolic encephalopathy, resolved At baseline the patient is interactive and alert, here he is somnolent and sluggish due to his infection. - Standard delirium precautions: blinds open and lights on during day, TV off, minimize interruptions at night, glasses/hearing aids, PT/OT, avoiding Beers list medications     Normocytic anemia Hemoglobin stable relative to baseline   Abnormal LFTs Mild, in the setting of amiodarone use, sepsis   Depression - not taking Paxil PTA   Paroxysmal atrial fibrillation Rate controlled, not on anticoagulation at home - Continue  amiodarone, low-dose aspirin   Chronic systolic CHF (congestive heart failure) Baseline EF 25 to 30%. Appears euvolemic to dehydrated, not on Lasix at baseline - Continue spironolactone   Hypothyroidism - Continue levothyroxine   Essential hypertension Blood pressure somewhat elevated - Continue spironolactone - Continue as needed hydralazine   BPH (benign prostatic hyperplasia) - Continue finasteride, Flomax   Coronary artery disease - Continue low-dose aspirin   DVT prophylaxis: Lovenox SQ Code Status: Full code  Family Communication: wife updated at bedside today Level of care: Telemetry Medical Dispo:   The patient is from: home Anticipated d/c is to: home Anticipated d/c date is: tomorrow   Subjective and Interval History:  O2 sats dropped this morning, per pt after having coughing spell.    Objective: Vitals:   04/01/23 0633 04/01/23 0841 04/01/23 0916 04/01/23 1404  BP: (!) 169/79  (!) 146/58   Pulse: 65  65   Resp: 19  18   Temp: 97.6 F (36.4 C)  98.4 F (36.9 C)   TempSrc:      SpO2: 98% 96% 94% 92%  Weight:      Height:        Intake/Output Summary (Last 24 hours) at 04/01/2023 1640 Last data filed at 04/01/2023 1455 Gross per 24 hour  Intake 120 ml  Output 750 ml  Net -630 ml   Filed Weights   03/30/23 0500 03/31/23 0230 04/01/23 0500  Weight: 88 kg 91.8 kg 91.9 kg    Examination:   Constitutional: NAD, AAOx3 HEENT: conjunctivae and lids normal, EOMI CV: No cyanosis.   RESP: normal respiratory effort, on RA Neuro: II - XII grossly intact.  Psych: Normal mood and affect.  Appropriate judgement and reason   Data Reviewed: I have personally reviewed labs and imaging studies  Time spent: 35 minutes  Darlin Priestly, MD [facility] If 7PM-7AM, please contact night-coverage 04/01/2023, 4:40 PM

## 2023-04-02 DIAGNOSIS — A419 Sepsis, unspecified organism: Secondary | ICD-10-CM | POA: Diagnosis not present

## 2023-04-02 LAB — BASIC METABOLIC PANEL
Anion gap: 8 (ref 5–15)
BUN: 23 mg/dL (ref 8–23)
CO2: 33 mmol/L — ABNORMAL HIGH (ref 22–32)
Calcium: 8.4 mg/dL — ABNORMAL LOW (ref 8.9–10.3)
Chloride: 98 mmol/L (ref 98–111)
Creatinine, Ser: 0.88 mg/dL (ref 0.61–1.24)
GFR, Estimated: >60 mL/min (ref >60)
Glucose, Bld: 162 mg/dL — ABNORMAL HIGH (ref 70–99)
Potassium: 4.6 mmol/L (ref 3.5–5.1)
Sodium: 139 mmol/L (ref 135–145)

## 2023-04-02 LAB — CULTURE, BLOOD (ROUTINE X 2)
Culture: NO GROWTH
Special Requests: ADEQUATE

## 2023-04-02 LAB — CBC
HCT: 38.2 % — ABNORMAL LOW (ref 39.0–52.0)
Hemoglobin: 11.7 g/dL — ABNORMAL LOW (ref 13.0–17.0)
MCH: 28.5 pg (ref 26.0–34.0)
MCHC: 30.6 g/dL (ref 30.0–36.0)
MCV: 92.9 fL (ref 80.0–100.0)
Platelets: 447 10*3/uL — ABNORMAL HIGH (ref 150–400)
RBC: 4.11 MIL/uL — ABNORMAL LOW (ref 4.22–5.81)
RDW: 15.4 % (ref 11.5–15.5)
WBC: 17.9 10*3/uL — ABNORMAL HIGH (ref 4.0–10.5)
nRBC: 0 % (ref 0.0–0.2)

## 2023-04-02 LAB — MAGNESIUM: Magnesium: 2.3 mg/dL (ref 1.7–2.4)

## 2023-04-02 MED ORDER — FUROSEMIDE 10 MG/ML IJ SOLN
40.0000 mg | Freq: Once | INTRAMUSCULAR | Status: AC
  Administered 2023-04-02: 40 mg via INTRAVENOUS
  Filled 2023-04-02: qty 4

## 2023-04-02 NOTE — TOC Transition Note (Signed)
Transition of Care [facility]) - CM/SW Discharge Note   Patient Details  Name: Nathaniel Ibarra MRN: [id number] Date of Birth: 03/29/1941  [facility]) CM/SW Contact:  Kemper Durie, RN Phone Number: 04/02/2023, 11:03 AM   Clinical Narrative:     Patient to discharge home today on home oxygen.  Spoke with wife, she continues to have questions prior to coming to transport patient home. Advised that MD is aware and will contact her.  Jasmine with [employer] aware of need for oxygen.  Home oxygen will be delivered to the room prior to discharge.    Final next level of care: Home/Self Care Barriers to Discharge: Barriers Resolved   Patient Goals and CMS Choice      Discharge Placement                         Discharge Plan and Services Additional resources added to the After Visit Summary for     Discharge Planning Services: CM Consult            DME Arranged: Oxygen DME Agency: [employer] Date DME Agency Contacted: 04/02/23 Time DME Agency Contacted: [phone] Representative spoke with at DME Agency: Leavy Cella            Social Determinants of Health (SDOH) Interventions SDOH Screenings   Food Insecurity: No Food Insecurity (03/28/2023)  Housing: Low Risk  (03/28/2023)  Transportation Needs: No Transportation Needs (03/28/2023)  Utilities: Not At Risk (03/28/2023)  Tobacco Use: Low Risk  (03/30/2023)     Readmission Risk Interventions    11/24/2020    1:44 PM  Readmission Risk Prevention Plan  Post Dischage Appt Complete  Medication Screening Complete  Transportation Screening Complete

## 2023-04-02 NOTE — Progress Notes (Signed)
PROGRESS NOTE    Nathaniel Ibarra  BTD:[id number] DOB: 1941-06-02 DOA: 03/28/2023 PCP: Sherron Monday, MD  160A/160A-AA  LOS: 5 days   Brief hospital course:   Assessment & Plan: Nathaniel Ibarra is an 82 y.o. M with sCHF EF 25-30%, CAD s/p PCI remote, pAF on aspirin only due to cost, LBBB, HTN, hypothyroidism, depression, and BPH who presented with cough congestion malaise, found to have sepsis due to CAP, hypoxic requiring 3 L of new O2 requirement.   * Sepsis Presented with leukocytosis, fever, encephalopathy, respiratory distress and respiratory failure due to pneumonia.   Acute hypoxemic respiratory failure --desat down to 76% with walking.  Needs 3L currently.  Due to Coronavirus viral infection and likely 2ndary bacterial infection. --no history of COPD or asthma, but has had some chronic lung disease since his bad COVID 2 years ago --pt was started on IV solumedrol for wheezing, transitioned to prednisone, done with 5 days of steroid. Plan: --cont DuoNeb scheduled --Continue supplemental O2 to keep sats >=90%, wean as tolerated --trial IV lasix 40 mg to improve respiratory status --will discharge with home O2.  CAP (community acquired pneumonia) --completed 5 days of ceftriaxone and azithromycin  Leukocytosis --WBC trending up, likely due to steroid use  Coronavirus viral PNA --not covid.   --supportive care  --standard precautions   Acute metabolic encephalopathy, resolved At baseline the patient is interactive and alert, here he is somnolent and sluggish due to his infection. - Standard delirium precautions: blinds open and lights on during day, TV off, minimize interruptions at night, glasses/hearing aids, PT/OT, avoiding Beers list medications     Normocytic anemia Hemoglobin stable relative to baseline   Abnormal LFTs Mild, in the setting of amiodarone use, sepsis   Depression - not taking Paxil PTA   Paroxysmal atrial fibrillation Rate controlled,  not on anticoagulation at home - Continue amiodarone, low-dose aspirin   Chronic systolic CHF (congestive heart failure) Baseline EF 25 to 30%. On presentation, appeared euvolemic to dehydrated, not on Lasix at baseline - Continue spironolactone --trial IV lasix 40 mg to improve respiratory status   Hypothyroidism - Continue levothyroxine   Essential hypertension Blood pressure somewhat elevated - Continue spironolactone - Continue as needed hydralazine   BPH (benign prostatic hyperplasia) - Continue finasteride, Flomax   Coronary artery disease - Continue low-dose aspirin   DVT prophylaxis: Lovenox SQ Code Status: Full code  Family Communication:  Level of care: Telemetry Medical Dispo:   The patient is from: home Anticipated d/c is to: home Anticipated d/c date is: tomorrow   Subjective and Interval History:  Pt continued to report improvement in breathing and cough, but still has O2 desat with ambulation.   Objective: Vitals:   04/01/23 2032 04/01/23 2322 04/02/23 0841 04/02/23 0845  BP:  (!) 137/50  130/68  Pulse:  (!) 59  (!) 58  Resp:  18  16  Temp:  97.6 F (36.4 C)  98 F (36.7 C)  TempSrc:    Oral  SpO2: 93% 96% 96% 98%  Weight:      Height:        Intake/Output Summary (Last 24 hours) at 04/02/2023 1443 Last data filed at 04/02/2023 1412 Gross per 24 hour  Intake 720 ml  Output 700 ml  Net 20 ml   Filed Weights   03/30/23 0500 03/31/23 0230 04/01/23 0500  Weight: 88 kg 91.8 kg 91.9 kg    Examination:   Constitutional: NAD, AAOx3 HEENT: conjunctivae and lids normal,  EOMI CV: No cyanosis.   RESP: normal respiratory effort, on 2L Extremities: No effusions, edema in BLE SKIN: warm, dry Neuro: II - XII grossly intact.   Psych: Normal mood and affect.  Appropriate judgement and reason   Data Reviewed: I have personally reviewed labs and imaging studies  Time spent: 35 minutes  Darlin Priestly, MD [facility] If 7PM-7AM, please  contact night-coverage 04/02/2023, 2:43 PM

## 2023-04-03 ENCOUNTER — Other Ambulatory Visit: Payer: Self-pay | Admitting: *Deleted

## 2023-04-03 DIAGNOSIS — A419 Sepsis, unspecified organism: Secondary | ICD-10-CM | POA: Diagnosis not present

## 2023-04-03 LAB — SARS CORONAVIRUS 2 BY RT PCR: SARS Coronavirus 2 by RT PCR: NEGATIVE

## 2023-04-03 LAB — BASIC METABOLIC PANEL
Anion gap: 7 (ref 5–15)
BUN: 26 mg/dL — ABNORMAL HIGH (ref 8–23)
CO2: 36 mmol/L — ABNORMAL HIGH (ref 22–32)
Calcium: 8.3 mg/dL — ABNORMAL LOW (ref 8.9–10.3)
Chloride: 94 mmol/L — ABNORMAL LOW (ref 98–111)
Creatinine, Ser: 0.9 mg/dL (ref 0.61–1.24)
GFR, Estimated: >60 mL/min (ref >60)
Glucose, Bld: 123 mg/dL — ABNORMAL HIGH (ref 70–99)
Potassium: 4.3 mmol/L (ref 3.5–5.1)
Sodium: 137 mmol/L (ref 135–145)

## 2023-04-03 LAB — CBC
HCT: 38.8 % — ABNORMAL LOW (ref 39.0–52.0)
Hemoglobin: 12.2 g/dL — ABNORMAL LOW (ref 13.0–17.0)
MCH: 29.1 pg (ref 26.0–34.0)
MCHC: 31.4 g/dL (ref 30.0–36.0)
MCV: 92.6 fL (ref 80.0–100.0)
Platelets: 417 10*3/uL — ABNORMAL HIGH (ref 150–400)
RBC: 4.19 MIL/uL — ABNORMAL LOW (ref 4.22–5.81)
RDW: 15.5 % (ref 11.5–15.5)
WBC: 21.3 10*3/uL — ABNORMAL HIGH (ref 4.0–10.5)
nRBC: 0 % (ref 0.0–0.2)

## 2023-04-03 LAB — PROCALCITONIN: Procalcitonin: <0.1 ng/mL

## 2023-04-03 LAB — MAGNESIUM: Magnesium: 2.3 mg/dL (ref 1.7–2.4)

## 2023-04-03 MED ORDER — FUROSEMIDE 10 MG/ML IJ SOLN
40.0000 mg | Freq: Once | INTRAMUSCULAR | Status: AC
  Administered 2023-04-03: 40 mg via INTRAVENOUS
  Filled 2023-04-03: qty 4

## 2023-04-03 NOTE — Progress Notes (Signed)
DISCHARGE NOTE:  Pt discharged with oxygen and tubing. Discharge instructions provided and pt voiced no questions or concerns at this time. Pt's IV removed and belongings bag in hand. Transportation provided via pt's wife.

## 2023-04-03 NOTE — Care Management Important Message (Signed)
Important Message  Patient Details  Name: Nathaniel Ibarra MRN: [id number] Date of Birth: 20-Jul-1941   Medicare Important Message Given:  Yes     Olegario Messier A Chezney Huether 04/03/2023, 11:32 AM

## 2023-04-03 NOTE — Care Management Important Message (Signed)
Important Message  Patient Details  Name: Nathaniel Ibarra MRN: [id number] Date of Birth: [birth year]   Medicare Important Message Given:  Other (see comment)  Patient is in an isolation room so I called [phone]) to review the Important Message from Medicare with him but his line is busy. Will try again.   Olegario Messier A Quincee Gittens 04/03/2023, 11:22 AM

## 2023-04-03 NOTE — Discharge Summary (Signed)
Physician Discharge Summary   Nathaniel Ibarra  male DOB: [birth year]  ZOX:[id number]  PCP: Sherron Monday, MD  Admit date: 03/28/2023 Discharge date: 04/03/2023  Admitted From: home Disposition:  home Son updated on the speaker phone prior to discharge. CODE STATUS: Full code  Discharge Instructions     Discharge instructions   Complete by: As directed    You have completed 5 days of IV antibiotic for your pneumonia.  For your coronavirus infection (not COVID), it will take time to recover.  You need 3 liters of supplemental oxygen when you walk around now.  Please follow up with your primary care doctor 1-2 weeks after discharge to check labs and to see if you can get off of the extra oxygen. [facility] Course:  For full details, please see H&P, progress notes, consult notes and ancillary notes.  Briefly,  Nathaniel Ibarra is an 82 y.o. M with sCHF EF 25-30%, CAD s/p PCI remote, pAF on aspirin only due to cost, HTN, hypothyroidism, who presented with cough congestion malaise, found to have sepsis due to CAP, hypoxic requiring 3 L of new O2 requirement.    * Sepsis Presented with leukocytosis, fever, encephalopathy, respiratory distress and respiratory failure due to pneumonia.   Acute hypoxemic respiratory failure --desat down to 76% with walking.  Needed 3L O2.  Due to Coronavirus viral infection and likely 2ndary bacterial infection. --no history of COPD or asthma, but has had some chronic lung disease since his bad COVID 2 years ago. --pt was started on IV solumedrol for wheezing, transitioned to prednisone, completed 5 days of steroid. --Pt also completed 5 days of abx for CAP.  Pt reported respiratory status much improved and felt ready to go home, however, continued to desat (although O2 sat didn't drop as low) with ambulation.  IV lasix 40 mg daily x3 doses were given to attempt to improve O2 requirement, with did not make a difference.  Pt was therefore  discharged on 3L home O2, and advised to f/u with PCP in 1-2 weeks to see if he can be weaned off supplemental O2.    CAP (community acquired pneumonia) --completed 5 days of ceftriaxone and azithromycin   Leukocytosis --likely due to steroid use   Coronavirus viral PNA --not covid.   --supportive care    Acute metabolic encephalopathy, resolved At baseline the patient is interactive and alert, on initial presentation he was somnolent and sluggish due to his infection.  Mental status was back to baseline by day 3 of hospitalization.   Abnormal LFTs Mild, in the setting of amiodarone use, sepsis   Depression - not taking Paxil PTA   Paroxysmal atrial fibrillation Rate controlled, not on anticoagulation at home - Continue amiodarone, low-dose aspirin   Chronic systolic CHF (congestive heart failure) Baseline EF 25 to 30%. On presentation, appeared euvolemic to dehydrated, not on Lasix at baseline - Continue spironolactone   Hypothyroidism - Continue levothyroxine   Essential hypertension - Continue spironolactone   BPH (benign prostatic hyperplasia) - Continue finasteride, Flomax   Coronary artery disease - Continue low-dose aspirin   Unless noted above, medications under "STOP" list are ones pt was not taking PTA.  Discharge Diagnoses:  Principal Problem:   Sepsis Active Problems:   CAP (community acquired pneumonia)   Coronary artery disease   BPH (benign prostatic hyperplasia)   Essential hypertension   Hypothyroidism   Chronic systolic CHF (congestive heart failure)   Paroxysmal atrial  fibrillation   Depression   Abnormal LFTs   Normocytic anemia   Acute metabolic encephalopathy   30 Day Unplanned Readmission Risk Score    Flowsheet Row ED to Hosp-Admission (Current) from 03/28/2023 in [facility] (1A)  30 Day Unplanned Readmission Risk Score (%) 11.89 Filed at 04/03/2023 1200       This score is the patient's risk  of an unplanned readmission within 30 days of being discharged (0 -100%). The score is based on dignosis, age, lab data, medications, orders, and past utilization.   Low:  0-14.9   Medium: 15-21.9   High: 22-29.9   Extreme: 30 and above         Discharge Instructions:  Allergies as of 04/03/2023   No Known Allergies      Medication List     STOP taking these medications    amoxicillin-clavulanate 875-125 MG tablet Commonly known as: AUGMENTIN   celecoxib 200 MG capsule Commonly known as: CELEBREX   HYDROcodone-acetaminophen 5-325 MG tablet Commonly known as: NORCO/VICODIN   oxyCODONE 5 MG immediate release tablet Commonly known as: Oxy IR/ROXICODONE   PARoxetine 20 MG tablet Commonly known as: PAXIL   predniSONE 20 MG tablet Commonly known as: DELTASONE       TAKE these medications    acetaminophen 325 MG tablet Commonly known as: TYLENOL Take 2 tablets (650 mg total) by mouth every 6 (six) hours as needed for mild pain or headache (fever >/= 101).   albuterol 108 (90 Base) MCG/ACT inhaler Commonly known as: VENTOLIN HFA Inhale 1 puff into the lungs every 4 (four) hours as needed.   amiodarone 200 MG tablet Commonly known as: Pacerone Take 1 tablet (200 mg total) by mouth daily.   finasteride 5 MG tablet Commonly known as: PROSCAR Take 1 tablet (5 mg total) by mouth daily.   ipratropium 0.06 % nasal spray Commonly known as: ATROVENT Place 2 sprays into the nose 3 (three) times daily as needed.   levothyroxine 100 MCG tablet Commonly known as: SYNTHROID TAKE 1 TABLET BY MOUTH EVERY DAY IN THE MORNING   naproxen 500 MG tablet Commonly known as: NAPROSYN Take 500 mg by mouth 2 (two) times daily as needed for mild pain.   promethazine-dextromethorphan 6.25-15 MG/5ML syrup Commonly known as: PROMETHAZINE-DM Take 5 mLs by mouth every 6 (six) hours as needed for cough.   spironolactone 25 MG tablet Commonly known as: ALDACTONE Take 0.5 tablets  (12.5 mg total) by mouth daily.   tamsulosin 0.4 MG Caps capsule Commonly known as: FLOMAX Take 0.8 mg by mouth at bedtime.               Durable Medical Equipment  (From admission, onward)           Start     Ordered   04/02/23 1029  For home use only DME oxygen  Once       Question Answer Comment  Length of Need 6 Months   Mode or (Route) Nasal cannula   Liters per Minute 3   Frequency Continuous (stationary and portable oxygen unit needed)   Oxygen delivery system Gas      04/02/23 1028             Follow-up Information     Sherron Monday, MD Follow up in 1 week(s).   Specialty: Internal Medicine Contact information: [address] [place] [address] [phone]  No Known Allergies   The results of significant diagnostics from this hospitalization (including imaging, microbiology, ancillary and laboratory) are listed below for reference.   Consultations:   Procedures/Studies: DG Chest Port 1 View  Result Date: 03/29/2023 CLINICAL DATA:  Shortness of breath EXAM: PORTABLE CHEST 1 VIEW COMPARISON:  03/28/2023, 01/04/2021 FINDINGS: Mild cardiomegaly.bronchitic changes with patchy right upper and lower lung base opacities. No pleural effusion. Stable cardiomediastinal silhouette. No pneumothorax IMPRESSION: Bronchitic changes with patchy opacities in the right upper and bilateral lower lungs, possible pneumonia. Findings do not appear significantly changed. Electronically Signed   By: Jasmine Pang M.D.   On: 03/29/2023 15:19   DG Chest 2 View  Result Date: 03/28/2023 CLINICAL DATA:  Suspected sepsis.  Hypoxic. EXAM: CHEST - 2 VIEW COMPARISON:  12/28/2020 FINDINGS: Stable cardiomediastinal contours. Asymmetric elevation of the right hemidiaphragm identified. Peribronchial opacities identified within the posterior right lower lobe and right upper lobe. Left lung appears clear. No pleural effusion or edema. Visualized osseous  structures are unremarkable. IMPRESSION: Peribronchial opacities within the posterior right lower lobe and right upper lobe compatible with pneumonia. Electronically Signed   By: Signa Kell M.D.   On: 03/28/2023 11:25      Labs: BNP (last 3 results) Recent Labs    04/11/22 0927 03/28/23 1049  BNP 410.5* 512.2*   Basic Metabolic Panel: Recent Labs  Lab 03/30/23 0609 03/31/23 0437 04/01/23 0616 04/02/23 0520 04/03/23 0539  NA 137 137 139 139 137  K 4.4 4.6 4.2 4.6 4.3  CL 101 99 101 98 94*  CO2 29 28 29  33* 36*  GLUCOSE 167* 208* 108* 162* 123*  BUN 20 28* 23 23 26*  CREATININE 0.78 0.98 0.79 0.88 0.90  CALCIUM 8.5* 8.3* 8.2* 8.4* 8.3*  MG  --  2.1 2.2 2.3 2.3   Liver Function Tests: Recent Labs  Lab 03/28/23 1049 03/30/23 0609  AST 79* 47*  ALT 87* 103*  ALKPHOS 90 88  BILITOT 0.7 0.5  PROT 6.6 6.1*  ALBUMIN 3.0* 2.6*   No results for input(s): "LIPASE", "AMYLASE" in the last 168 hours. No results for input(s): "AMMONIA" in the last 168 hours. CBC: Recent Labs  Lab 03/28/23 1049 03/29/23 0606 03/30/23 0609 03/31/23 0437 04/01/23 0616 04/02/23 0520 04/03/23 0539  WBC 17.2*   < > 13.6* 22.3* 20.4* 17.9* 21.3*  NEUTROABS 14.3*  --   --   --   --   --   --   HGB 11.9*   < > 11.1* 11.1* 10.9* 11.7* 12.2*  HCT 37.7*   < > 36.0* 35.5* 35.5* 38.2* 38.8*  MCV 94.0   < > 94.0 92.9 92.7 92.9 92.6  PLT 372   < > 345 376 402* 447* 417*   < > = values in this interval not displayed.   Cardiac Enzymes: No results for input(s): "CKTOTAL", "CKMB", "CKMBINDEX", "TROPONINI" in the last 168 hours. BNP: Invalid input(s): "POCBNP" CBG: Recent Labs  Lab 03/29/23 2331 03/30/23 0406  GLUCAP 233* 187*   D-Dimer No results for input(s): "DDIMER" in the last 72 hours. Hgb A1c No results for input(s): "HGBA1C" in the last 72 hours. Lipid Profile No results for input(s): "CHOL", "HDL", "LDLCALC", "TRIG", "CHOLHDL", "LDLDIRECT" in the last 72 hours. Thyroid function  studies No results for input(s): "TSH", "T4TOTAL", "T3FREE", "THYROIDAB" in the last 72 hours.  Invalid input(s): "FREET3" Anemia work up No results for input(s): "VITAMINB12", "FOLATE", "FERRITIN", "TIBC", "IRON", "RETICCTPCT" in the last 72 hours. Urinalysis  Component Value Date/Time   COLORURINE YELLOW (A) 03/28/2023 1325   APPEARANCEUR CLEAR (A) 03/28/2023 1325   LABSPEC 1.020 03/28/2023 1325   PHURINE 5.0 03/28/2023 1325   GLUCOSEU NEGATIVE 03/28/2023 1325   HGBUR MODERATE (A) 03/28/2023 1325   BILIRUBINUR NEGATIVE 03/28/2023 1325   KETONESUR NEGATIVE 03/28/2023 1325   PROTEINUR 30 (A) 03/28/2023 1325   NITRITE NEGATIVE 03/28/2023 1325   LEUKOCYTESUR NEGATIVE 03/28/2023 1325   Sepsis Labs Recent Labs  Lab 03/31/23 0437 04/01/23 0616 04/02/23 0520 04/03/23 0539  WBC 22.3* 20.4* 17.9* 21.3*   Microbiology Recent Results (from the past 240 hour(s))  Culture, blood (Routine x 2)     Status: None   Collection Time: 03/28/23 10:49 AM   Specimen: BLOOD  Result Value Ref Range Status   Specimen Description BLOOD LEFT ARM  Final   Special Requests   Final    BOTTLES DRAWN AEROBIC AND ANAEROBIC Blood Culture adequate volume   Culture   Final    NO GROWTH 5 DAYS Performed at [facility], [address] [place], [place] [address]    Report Status 04/02/2023 FINAL  Final  Resp panel by RT-PCR (RSV, Flu A&B, Covid) Anterior Nasal Swab     Status: None   Collection Time: 03/28/23 10:50 AM   Specimen: Anterior Nasal Swab  Result Value Ref Range Status   SARS Coronavirus 2 by RT PCR NEGATIVE NEGATIVE Final    Comment: (NOTE) SARS-CoV-2 target nucleic acids are NOT DETECTED.  The SARS-CoV-2 RNA is generally detectable in upper respiratory specimens during the acute phase of infection. The lowest concentration of SARS-CoV-2 viral copies this assay can detect is 138 copies/mL. A negative result does not preclude SARS-Cov-2 infection and should not be used as  the sole basis for treatment or other patient management decisions. A negative result may occur with  improper specimen collection/handling, submission of specimen other than nasopharyngeal swab, presence of viral mutation(s) within the areas targeted by this assay, and inadequate number of viral copies(<138 copies/mL). A negative result must be combined with clinical observations, patient history, and epidemiological information. The expected result is Negative.  Fact Sheet for Patients:  BloggerCourse.com  Fact Sheet for Healthcare Providers:  SeriousBroker.it  This test is no t yet approved or cleared by the [place] FDA and  has been authorized for detection and/or diagnosis of SARS-CoV-2 by FDA under an Emergency Use Authorization (EUA). This EUA will remain  in effect (meaning this test can be used) for the duration of the COVID-19 declaration under Section 564(b)(1) of the Act, 21 U.S.C.section 360bbb-3(b)(1), unless the authorization is terminated  or revoked sooner.       Influenza A by PCR NEGATIVE NEGATIVE Final   Influenza B by PCR NEGATIVE NEGATIVE Final    Comment: (NOTE) The Xpert Xpress SARS-CoV-2/FLU/RSV plus assay is intended as an aid in the diagnosis of influenza from Nasopharyngeal swab specimens and should not be used as a sole basis for treatment. Nasal washings and aspirates are unacceptable for Xpert Xpress SARS-CoV-2/FLU/RSV testing.  Fact Sheet for Patients: BloggerCourse.com  Fact Sheet for Healthcare Providers: SeriousBroker.it  This test is not yet approved or cleared by the [place] FDA and has been authorized for detection and/or diagnosis of SARS-CoV-2 by FDA under an Emergency Use Authorization (EUA). This EUA will remain in effect (meaning this test can be used) for the duration of the COVID-19 declaration under Section 564(b)(1) of  the Act, 21 U.S.C. section 360bbb-3(b)(1), unless the authorization  is terminated or revoked.     Resp Syncytial Virus by PCR NEGATIVE NEGATIVE Final    Comment: (NOTE) Fact Sheet for Patients: BloggerCourse.com  Fact Sheet for Healthcare Providers: SeriousBroker.it  This test is not yet approved or cleared by the [place] FDA and has been authorized for detection and/or diagnosis of SARS-CoV-2 by FDA under an Emergency Use Authorization (EUA). This EUA will remain in effect (meaning this test can be used) for the duration of the COVID-19 declaration under Section 564(b)(1) of the Act, 21 U.S.C. section 360bbb-3(b)(1), unless the authorization is terminated or revoked.  Performed at [facility], [address] [place], [place] [address]   Respiratory (~20 pathogens) panel by PCR     Status: Abnormal   Collection Time: 03/28/23 10:50 AM   Specimen: Nasopharyngeal Swab; Respiratory  Result Value Ref Range Status   Adenovirus NOT DETECTED NOT DETECTED Final   Coronavirus 229E DETECTED (A) NOT DETECTED Final    Comment: (NOTE) The Coronavirus on the Respiratory Panel, DOES NOT test for the novel  Coronavirus (2019 nCoV)    Coronavirus HKU1 NOT DETECTED NOT DETECTED Final   Coronavirus NL63 NOT DETECTED NOT DETECTED Final   Coronavirus OC43 NOT DETECTED NOT DETECTED Final   Metapneumovirus NOT DETECTED NOT DETECTED Final   Rhinovirus / Enterovirus NOT DETECTED NOT DETECTED Final   Influenza A NOT DETECTED NOT DETECTED Final   Influenza B NOT DETECTED NOT DETECTED Final   Parainfluenza Virus 1 NOT DETECTED NOT DETECTED Final   Parainfluenza Virus 2 NOT DETECTED NOT DETECTED Final   Parainfluenza Virus 3 NOT DETECTED NOT DETECTED Final   Parainfluenza Virus 4 NOT DETECTED NOT DETECTED Final   Respiratory Syncytial Virus NOT DETECTED NOT DETECTED Final   Bordetella pertussis NOT DETECTED NOT DETECTED Final    Bordetella Parapertussis NOT DETECTED NOT DETECTED Final   Chlamydophila pneumoniae NOT DETECTED NOT DETECTED Final   Mycoplasma pneumoniae NOT DETECTED NOT DETECTED Final    Comment: Performed at [facility], [address], [place], [place] [address]  Culture, blood (Routine x 2)     Status: None   Collection Time: 03/28/23  1:25 PM   Specimen: BLOOD  Result Value Ref Range Status   Specimen Description BLOOD LEFT HAND  Final   Special Requests   Final    BOTTLES DRAWN AEROBIC AND ANAEROBIC Blood Culture adequate volume   Culture   Final    NO GROWTH 5 DAYS Performed at [facility], [address] [place], [place] [address]    Report Status 04/02/2023 FINAL  Final  Expectorated Sputum Assessment w Gram Stain, Rflx to Resp Cult     Status: None   Collection Time: 03/28/23  7:00 PM   Specimen: Sputum  Result Value Ref Range Status   Specimen Description SPUTUM  Final   Special Requests EXPSU  Final   Sputum evaluation   Final    THIS SPECIMEN IS ACCEPTABLE FOR SPUTUM CULTURE Performed at [facility], [address] [place], [place] [address]    Report Status 03/28/2023 FINAL  Final  Culture, Respiratory w Gram Stain     Status: None   Collection Time: 03/28/23  7:00 PM   Specimen: SPU  Result Value Ref Range Status   Specimen Description   Final    SPUTUM Performed at [facility], [address] [place], [place] [address]    Special Requests   Final    EXPSU Reflexed from [phone]  Performed at [facility] [facility] [place], [place] [address]    Gram Stain   Final    RARE WBC PRESENT, PREDOMINANTLY PMN FEW GRAM POSITIVE COCCI    Culture   Final    RARE Normal respiratory flora-no Staph aureus or Pseudomonas seen Performed at [facility], [address] [place], [place] [address]    Report Status 03/31/2023 FINAL  Final  SARS Coronavirus 2 by RT PCR (hospital order, performed in [facility] hospital lab)  *cepheid single result test* Anterior Nasal Swab     Status: None   Collection Time: 04/03/23  9:41 AM   Specimen: Anterior Nasal Swab  Result Value Ref Range Status   SARS Coronavirus 2 by RT PCR NEGATIVE NEGATIVE Final    Comment: (NOTE) SARS-CoV-2 target nucleic acids are NOT DETECTED.  The SARS-CoV-2 RNA is generally detectable in upper and lower respiratory specimens during the acute phase of infection. The lowest concentration of SARS-CoV-2 viral copies this assay can detect is 250 copies / mL. A negative result does not preclude SARS-CoV-2 infection and should not be used as the sole basis for treatment or other patient management decisions.  A negative result may occur with improper specimen collection / handling, submission of specimen other than nasopharyngeal swab, presence of viral mutation(s) within the areas targeted by this assay, and inadequate number of viral copies (<250 copies / mL). A negative result must be combined with clinical observations, patient history, and epidemiological information.  Fact Sheet for Patients:   RoadLapTop.co.za  Fact Sheet for Healthcare Providers: http://kim-miller.com/  This test is not yet approved or  cleared by the [place] FDA and has been authorized for detection and/or diagnosis of SARS-CoV-2 by FDA under an Emergency Use Authorization (EUA).  This EUA will remain in effect (meaning this test can be used) for the duration of the COVID-19 declaration under Section 564(b)(1) of the Act, 21 U.S.C. section 360bbb-3(b)(1), unless the authorization is terminated or revoked sooner.  Performed at [facility], [address] [place], [place] [address]      Total time spend on discharging this patient, including the last patient exam, discussing the hospital stay, instructions for ongoing care as it relates to all pertinent caregivers, as well as preparing the medical  discharge records, prescriptions, and/or referrals as applicable, is 45 minutes.    Darlin Priestly, MD  [facility] 04/03/2023, 12:45 PM

## 2023-04-03 NOTE — Plan of Care (Signed)

## 2023-04-03 NOTE — Plan of Care (Signed)
  Problem: Activity: Goal: Ability to tolerate increased activity will improve Outcome: Progressing   Problem: Clinical Measurements: Goal: Ability to maintain a body temperature in the normal range will improve Outcome: Progressing   Problem: Respiratory: Goal: Ability to maintain adequate ventilation will improve Outcome: Progressing   Problem: Health Behavior/Discharge Planning: Goal: Ability to manage health-related needs will improve Outcome: Progressing   Problem: Clinical Measurements: Goal: Ability to maintain clinical measurements within normal limits will improve Outcome: Progressing Goal: Will remain free from infection Outcome: Progressing   Problem: Activity: Goal: Risk for activity intolerance will decrease Outcome: Progressing   Problem: Nutrition: Goal: Adequate nutrition will be maintained Outcome: Progressing

## 2023-04-03 NOTE — Consult Note (Signed)
   [facility] Inpatient Consult   04/03/2023  Nathaniel Ibarra [birth year] [id number]     Location: [facility] RN Hospital Liaison screened remotely [facility]).   [facility] Customer service manager [facility]) Accountable Care Organization [ACO] Patient: Insurance (HTA)    Primary Care Provider:  Sherron Monday, MD with [facility]   Patient screened for readmission hospitalization with noted low risk risk score for unplanned readmission risk with 2  IP in 6 months. [facility]/Population Health RN liaison will assess for potential [facility]) Care Management service needs for post hospital transition for care coordination. [facility] liaison spoke with pt remotely concerning [facility] care coordination and available services with [facility]. Pt receptive to a follow up call of inquire on managing his ongoing health.    Plan. [facility]/Population Health RN Liaison will continue to follow ongoing disposition in assessing for post hospital community care coordination/management needs.  Referral request for community care coordination: due to admitting diagnosis, new home O2 and insurance.   [facility] Care Management/Population Health does not replace or interfere with any arrangements made by the Inpatient Transition of Care team.   For questions contact:       Elliot Cousin, RN, BSN [facility] Liaison [place]   [facility]  Population Health Office Hours MTWF 8:00 am to 6 pm off on Thursday [phone] mobile [phone] [Office toll free line][facility] Office Hours are M-F 8:30 - 5 pm 24 hour nurse advise line [phone] Conceirge  [email or web address]

## 2023-04-03 NOTE — Progress Notes (Signed)
Mobility Specialist - Progress Note  3 liters O2 Pre-mobility: HR 63, SpO2 93% During mobility: HR 80, SpO2 91-92% Post-mobility: SPO2 94%   04/03/23 1053  Mobility  Activity Ambulated with assistance in hallway  Level of Assistance Standby assist, set-up cues, supervision of patient - no hands on  Assistive Device None  Distance Ambulated (ft) 180 ft  Activity Response Tolerated well  $Mobility charge 1 Mobility   Pt supine upon entry, utilizing 4L [place]. Pt transferred to the EOB MinA for trunk support. Pt weaned down to 3L, O2 >90% while seated EOB. Pt amb one lap around the NS, O2 between 91%-92% throughout activity. Pt denied any SOB, dizziness or fatigue during amb. Pt returned to room, left seated on the bed with needs within reach. Placed back on 4L [place].   Zetta Bills Mobility Specialist 04/03/23 11:07 AM

## 2023-04-04 ENCOUNTER — Telehealth: Payer: Self-pay | Admitting: *Deleted

## 2023-04-04 NOTE — Progress Notes (Signed)
  Care Coordination   Note   04/04/2023 Name: Nathaniel Ibarra MRN: [id number] DOB: Nov 03, 1941  Nathaniel Ibarra is a 82 y.o. year old male who sees Sherron Monday, MD for primary care. I reached out to Georgeanna Harrison by phone today to offer care coordination services.  [name] was given information about Care Coordination services today including:   The Care Coordination services include support from the care team which includes your Nurse Coordinator, Clinical Social Worker, or Pharmacist.  The Care Coordination team is here to help remove barriers to the health concerns and goals most important to you. Care Coordination services are voluntary, and the patient may decline or stop services at any time by request to their care team member.   Care Coordination Consent Status: Patient agreed to services and verbal consent obtained.   Follow up plan:  Telephone appointment with care coordination team member scheduled for:  04/07/2023  Encounter Outcome:  Pt. Scheduled from referral   Burman Nieves, [place] Care Coordination Care Guide Direct Dial: [phone]

## 2023-04-07 ENCOUNTER — Ambulatory Visit: Payer: Self-pay

## 2023-04-07 NOTE — Patient Instructions (Signed)
Visit Information  Thank you for taking time to visit with me today. Please don't hesitate to contact me if I can be of assistance to you.   Following are the goals we discussed today:  Notify provider for any new or ongoing symptoms.  Wear oxygen as recommended by provider and monitor oxygen level throughout the day Oxygen level needs to remain in the 90's  Review education article sent to you in Mychart regarding heart failure action plan    Our next appointment is by telephone on 04/25/23 at 9:30 am  Please call the care guide team at [phone] if you need to cancel or reschedule your appointment.   If you are experiencing a Mental Health or Behavioral Health Crisis or need someone to talk to, please call the Suicide and Crisis Lifeline: 988 call 1-800-273-TALK (toll free, 24 hour hotline)  Patient verbalizes understanding of instructions and care plan provided today and agrees to view in MyChart. Active MyChart status and patient understanding of how to access instructions and care plan via MyChart confirmed with patient.     George Ina RN,BSN,CCM [facility] Care Coordination [phone] direct line  Heart Failure Action Plan A heart failure action plan helps you understand what to do when you have symptoms of heart failure. Your action plan is a color-coded plan that lists the symptoms to watch for and indicates what actions to take. If you have symptoms in the red zone, you need medical care right away. If you have symptoms in the yellow zone, you are having problems. If you have symptoms in the Naseem Varden zone, you are doing well. Follow the plan that was created by you and your health care provider. Review your plan each time you visit your health care provider. Red zone These signs and symptoms mean you should get medical help right away: You have trouble breathing when resting. You have a dry cough that is getting worse. You have swelling or pain in your legs or abdomen that is  getting worse. You suddenly gain more than 2-3 lb (0.9-1.4 kg) in 24 hours, or more than 5 lb (2.3 kg) in a week. This amount may be more or less depending on your condition. You have trouble staying awake or you feel confused. You have chest pain. You do not have an appetite. You pass out. You have worsening sadness or depression. If you have any of these symptoms, call your local emergency services (911 in the [place] right away. Do not drive yourself to the hospital. Yellow zone These signs and symptoms mean your condition may be getting worse and you should make some changes: You have trouble breathing when you are active, or you need to sleep with your head raised on extra pillows to help you breathe. You have swelling in your legs or abdomen. You gain 2-3 lb (0.9-1.4 kg) in 24 hours, or 5 lb (2.3 kg) in a week. This amount may be more or less depending on your condition. You get tired easily. You have trouble sleeping. You have a dry cough. If you have any of these symptoms: Contact your health care provider within the next day. Your health care provider may adjust your medicines. Avilene Marrin zone These signs mean you are doing well and can continue what you are doing: You do not have shortness of breath. You have very little swelling or no new swelling. Your weight is stable (no gain or loss). You have a normal activity level. You do not have chest pain or  any other new symptoms. Follow these instructions at home: Take over-the-counter and prescription medicines only as told by your health care provider. Weigh yourself daily. Your target weight is __________ lb (__________ kg). Call your health care provider if you gain more than __________ lb (__________ kg) in 24 hours, or more than __________ lb (__________ kg) in a week. Health care provider name: _____________________________________________________ Health care provider phone number:  _____________________________________________________ Eat a heart-healthy diet. Work with a diet and nutrition specialist (dietitian) to create an eating plan that is best for you. Keep all follow-up visits. This is important. Where to find more information American [employer] Association: Summary A heart failure action plan helps you understand what to do when you have symptoms of heart failure. Follow the action plan that was created by you and your health care provider. Get help right away if you have any symptoms in the red zone. This information is not intended to replace advice given to you by your health care provider. Make sure you discuss any questions you have with your health care provider. Document Revised: 03/15/2022 Document Reviewed: 07/20/2020 Elsevier Patient Education  2023 [employer].

## 2023-04-07 NOTE — Patient Outreach (Signed)
  Care Coordination   Initial Visit Note   04/07/2023 Name: Nathaniel Ibarra MRN: [id number] DOB: 11-Oct-1941  Nathaniel Ibarra is a 82 y.o. year old male who sees Sherron Monday, MD for primary care. I spoke with  Nathaniel Ibarra  and wife Nathaniel Ibarra by phone today. Patient gave verbal permission to speak with his wife Nathaniel Ibarra regarding his health information   What matters to the patients health and wellness today?  Wife / patient states he is doing well. Denies any ongoing pneumonia like symptoms. Wife states patient continues to use oxygen only on occasion.  She reports patient was off of his oxygen all day yesterday and his pulse oximetry reading was 92%.  She states he cut the grass without oxygen and O2 sat came down to 89%.  Wife denies patient having any heart failure symptoms at this time. Patient reports current weight is 195 lbs.  Wife states patient is not weighing daily.     Goals Addressed             This Visit's Progress    Continued improvement post hospitalization for pneumonia       Interventions Today    Flowsheet Row Most Recent Value  Chronic Disease   Chronic disease during today's visit Other, Congestive Heart Failure (CHF)  General Interventions   General Interventions Discussed/Reviewed General Interventions Discussed, Doctor Visits  [Evaluation of current treatment plan for pneumonia/ heart failure and patients adherence to plan as established by provider.  Assessed for pneumonia / HF symptoms. STressed importance of oxygen level remaining/ being in the 90's.]  Doctor Visits Discussed/Reviewed Doctor Visits Discussed  [reviewed scheduled / upcoming provider appointments. Advised to keep post hospital follow up visit with provider.]  Education Interventions   Education Provided Provided Printed Education  [facility] action plan sent to patient in Mychart.  Advised to monitor oxygen level daily.  Wear oxygen as recommended by provider. Advised to  notify provider for any new or ongoing symptoms. Discussed importance of weighing daily and recording. HF discussed.]  Provided Verbal Education On Other  Pharmacy Interventions   Pharmacy Dicussed/Reviewed Pharmacy Topics Discussed  [medications reviewed and compliance discussed.]              SDOH assessments and interventions completed:  Yes  SDOH Interventions Today    Flowsheet Row Most Recent Value  SDOH Interventions   Food Insecurity Interventions Intervention Not Indicated  Housing Interventions Intervention Not Indicated  Transportation Interventions Intervention Not Indicated        Care Coordination Interventions:  Yes, provided   Follow up plan: Follow up call scheduled for 04/25/23    Encounter Outcome:  Pt. Visit Completed   George Ina RN,BSN,CCM [facility] Care Coordination [phone] direct line

## 2023-04-10 ENCOUNTER — Ambulatory Visit: Payer: PPO | Admitting: Internal Medicine

## 2023-04-10 ENCOUNTER — Ambulatory Visit: Payer: PPO | Admitting: Nurse Practitioner

## 2023-04-13 DIAGNOSIS — G4733 Obstructive sleep apnea (adult) (pediatric): Secondary | ICD-10-CM | POA: Diagnosis not present

## 2023-04-13 DIAGNOSIS — U071 COVID-19: Secondary | ICD-10-CM | POA: Diagnosis not present

## 2023-04-13 DIAGNOSIS — R531 Weakness: Secondary | ICD-10-CM | POA: Diagnosis not present

## 2023-04-13 DIAGNOSIS — I5032 Chronic diastolic (congestive) heart failure: Secondary | ICD-10-CM | POA: Diagnosis not present

## 2023-04-13 DIAGNOSIS — R062 Wheezing: Secondary | ICD-10-CM | POA: Diagnosis not present

## 2023-04-17 ENCOUNTER — Ambulatory Visit (INDEPENDENT_AMBULATORY_CARE_PROVIDER_SITE_OTHER): Payer: PPO | Admitting: Internal Medicine

## 2023-04-17 ENCOUNTER — Encounter: Payer: Self-pay | Admitting: Internal Medicine

## 2023-04-17 ENCOUNTER — Ambulatory Visit: Payer: PPO | Admitting: Nurse Practitioner

## 2023-04-17 VITALS — BP 146/70 | HR 67 | Ht 72.0 in | Wt 202.6 lb

## 2023-04-17 DIAGNOSIS — R197 Diarrhea, unspecified: Secondary | ICD-10-CM | POA: Diagnosis not present

## 2023-04-17 DIAGNOSIS — I2510 Atherosclerotic heart disease of native coronary artery without angina pectoris: Secondary | ICD-10-CM

## 2023-04-17 DIAGNOSIS — J189 Pneumonia, unspecified organism: Secondary | ICD-10-CM

## 2023-04-17 NOTE — Progress Notes (Signed)
Established Patient Office Visit  Subjective:  Patient ID: Nathaniel Ibarra, male    DOB: [birth year]  Age: 82 y.o. MRN: [id number]  Chief Complaint  Patient presents with   Hospitalization Follow-up    Hospital follow up, diarrhea, nausea.    No new complaints, here for hospital follow up after hospitalization for pneumonia. D'd home two weeks ago but still c/o shortness of breath on exertion and chronic nausea. Also c/o loose diarrhea x 1 wk. Mood has improved.     No other concerns at this time.   Past Medical History:  Diagnosis Date   Aortic atherosclerosis (HCC)    Basal cell carcinoma 05/18/2022   Left inf cheek - EDC   Bladder cancer (HCC) 2008   BPH (benign prostatic hyperplasia)    Bradycardia    Cardiomyopathy (HCC)    a.) TTE 08/17/2015: EF 55%; b.) LHC 09/01/2015: EF 70%; c.) LHC 11/14/2017: EF >55%; d.) TTE 11/15/2017: EF 45%; e.) MPI 08/05/2019: EF 45%; f.) TTE 05/17/2020: EF 25-30%; g.) TTE 07/16/2020: EF >55%; h.) TTE 03/02/2022: EF >55%   Cataract, bilateral    Coronary artery disease    a.) questionable NSTEMI 2016; b.) LHC/PCI 09/01/2015: 80% mLAD (2.75 x 15 mm Xience Alpine DES), 50% m-dLCx, 50% dLCx; c.) LHC 11/14/2017: 30% oD1-D1, 40% pLCx, 40% mLAD -- med mgmt   Depression    GERD (gastroesophageal reflux disease)    HFrEF (heart failure with reduced ejection fraction) (HCC)    a.) TTE 08/17/2015: EF 55%, anteroseptal HK, triv PR/TR, G1DD; b.) TTE 11/15/2017: EF 45%, mild LAE, mild MR, mod RVE; c.) TTE 05/17/2020: EF 25-30%, glob HK, mod LV dil, mild LVH, mild BAE, mod MR, triv AR; d.) TTE 07/16/2020: EF >55%, mild LVH, triv MR, mild TR/PR; e.) TTE 03/02/2022: EF >55%, mild LVH, LAE, mild MR/TR/PR, G1DD   History of 2019 novel coronavirus disease (COVID-19)    a.) 12/2020; b.) 08/04/2022   HLD (hyperlipidemia)    Hypertension    Hypothyroidism    LBBB (left bundle branch block)    Long term current use of amiodarone    NSTEMI (non-ST elevated  myocardial infarction) (HCC) 2016   Osteoarthritis    Paroxysmal atrial fibrillation (HCC)    a.) CHA2DS2VASc = 5 (age x 2, HFrEF, HTN, previous MI);  b.) rate/rhythm maintained on oral amiodarone; no chronic anticoagulation (discontinued due to cost, medical noncompliance, falls)   Peripheral edema    Pneumonia due to COVID-19 virus 12/2020   Sleep difficulties    a.) uses trazodone PRN   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   L mid dorsum forearm - ED&C    Past Surgical History:  Procedure Laterality Date   CARDIAC CATHETERIZATION N/A 09/01/2015   Procedure: Left Heart Cath and Coronary Angiography;  Surgeon: Laurier Nancy, MD;  Location: [facility];  Service: Cardiovascular;  Laterality: N/A;   CARDIAC CATHETERIZATION N/A 09/01/2015   Procedure: Coronary Stent Intervention;  Surgeon: Alwyn Pea, MD;  Location: [facility];  Service: Cardiovascular;  Laterality: N/A;   CATARACT EXTRACTION W/PHACO Left 11/23/2016   Procedure: CATARACT EXTRACTION PHACO AND INTRAOCULAR LENS PLACEMENT (IOC);  Surgeon: Lockie Mola, MD;  Location: [facility];  Service: Ophthalmology;  Laterality: Left;   CATARACT EXTRACTION W/PHACO Right 02/01/2017   Procedure: CATARACT EXTRACTION PHACO AND INTRAOCULAR LENS PLACEMENT (IOC) Complicated  right toric lens;  Surgeon: Lockie Mola, MD;  Location: [facility];  Service: Ophthalmology;  Laterality: Right;  Malyugin toric Lens   KNEE ARTHROPLASTY Left 10/24/2022   Procedure: COMPUTER ASSISTED TOTAL KNEE ARTHROPLASTY;  Surgeon: Donato Heinz, MD;  Location: [facility];  Service: Orthopedics;  Laterality: Left;   LEFT HEART CATH AND CORONARY ANGIOGRAPHY Right 11/14/2017   Procedure: LEFT HEART CATH AND CORONARY ANGIOGRAPHY;  Surgeon: Laurier Nancy, MD;  Location: [facility];  Service: Cardiovascular;  Laterality: Right;   SCROTAL EXPLORATION Left 11/24/2020   Procedure: SCROTUM EXPLORATION WITH  LEFT ORCHIECTOMY;  Surgeon: Noel Christmas, MD;  Location: [facility];  Service: Urology;  Laterality: Left;   TONSILLECTOMY      Social History   Socioeconomic History   Marital status: Married    Spouse name: [name]   Number of children: 2   Years of education: Not on file   Highest education level: Not on file  Occupational History   Not on file  Tobacco Use   Smoking status: Never   Smokeless tobacco: Never  Vaping Use   Vaping Use: Never used  Substance and Sexual Activity   Alcohol use: No   Drug use: No   Sexual activity: Not Currently  Other Topics Concern   Not on file  Social History Narrative   Not on file   Social Determinants of Health   Financial Resource Strain: Not on file  Food Insecurity: No Food Insecurity (04/07/2023)   Hunger Vital Sign    Worried About Running Out of Food in the Last Year: Never true    Ran Out of Food in the Last Year: Never true  Transportation Needs: No Transportation Needs (04/07/2023)   PRAPARE - Administrator, Civil Service (Medical): No    Lack of Transportation (Non-Medical): No  Physical Activity: Not on file  Stress: Not on file  Social Connections: Not on file  Intimate Partner Violence: Not At Risk (03/28/2023)   Humiliation, Afraid, Rape, and Kick questionnaire    Fear of Current or Ex-Partner: No    Emotionally Abused: No    Physically Abused: No    Sexually Abused: No    Family History  Problem Relation Age of Onset   Emphysema Mother    Emphysema Father     No Known Allergies  Review of Systems  Constitutional: Negative.  Negative for weight loss.  HENT: Negative.    Eyes: Negative.   Respiratory:  Positive for shortness of breath. Negative for cough.   Cardiovascular: Negative.   Gastrointestinal: Negative.   Genitourinary: Negative.   Skin: Negative.   Neurological: Negative.   Endo/Heme/Allergies: Negative.        Objective:   BP (!) 146/70   Pulse 67   Ht 6' (1.829 m)   Wt  202 lb 9.6 oz (91.9 kg)   SpO2 (!) 89%   BMI 27.48 kg/m   Vitals:   04/17/23 1524  BP: (!) 146/70  Pulse: 67  Height: 6' (1.829 m)  Weight: 202 lb 9.6 oz (91.9 kg)  SpO2: (!) 89%  BMI (Calculated): 27.47    Physical Exam Vitals reviewed.  Constitutional:      Appearance: Normal appearance.  HENT:     Head: Normocephalic.     Left Ear: There is no impacted cerumen.     Nose: Nose normal.     Mouth/Throat:     Mouth: Mucous membranes are moist.     Pharynx: No posterior oropharyngeal erythema.  Eyes:     Extraocular Movements: Extraocular movements intact.  Pupils: Pupils are equal, round, and reactive to light.  Cardiovascular:     Rate and Rhythm: Regular rhythm.     Chest Wall: PMI is not displaced.     Pulses: Normal pulses.     Heart sounds: Normal heart sounds. No murmur heard. Pulmonary:     Effort: Pulmonary effort is normal.     Breath sounds: Normal air entry. No rhonchi or rales.  Abdominal:     General: Abdomen is flat and protuberant. Bowel sounds are normal. There is no distension.     Palpations: Abdomen is soft. There is no hepatomegaly, splenomegaly or mass.     Tenderness: There is no abdominal tenderness.  Musculoskeletal:        General: Normal range of motion.     Cervical back: Normal range of motion and neck supple.     Right lower leg: No edema.     Left lower leg: No edema.  Skin:    General: Skin is warm and dry.  Neurological:     General: No focal deficit present.     Mental Status: He is alert and oriented to person, place, and time.     Cranial Nerves: No cranial nerve deficit.     Motor: No weakness.  Psychiatric:        Mood and Affect: Mood normal.        Behavior: Behavior normal.      No results found for any visits on 04/17/23.  Recent Results (from the past 2160 hour(Carlon Davidson))  Comprehensive metabolic panel     Status: Abnormal   Collection Time: 03/28/23 10:49 AM  Result Value Ref Range   Sodium 136 135 - 145 mmol/L    Potassium 4.5 3.5 - 5.1 mmol/L   Chloride 103 98 - 111 mmol/L   CO2 28 22 - 32 mmol/L   Glucose, Bld 168 (H) 70 - 99 mg/dL    Comment: Glucose reference range applies only to samples taken after fasting for at least 8 hours.   BUN 24 (H) 8 - 23 mg/dL   Creatinine, Ser 8.65 0.61 - 1.24 mg/dL   Calcium 8.3 (L) 8.9 - 10.3 mg/dL   Total Protein 6.6 6.5 - 8.1 g/dL   Albumin 3.0 (L) 3.5 - 5.0 g/dL   AST 79 (H) 15 - 41 U/L   ALT 87 (H) 0 - 44 U/L   Alkaline Phosphatase 90 38 - 126 U/L   Total Bilirubin 0.7 0.3 - 1.2 mg/dL   GFR, Estimated >78 >46 mL/min    Comment: (NOTE) Calculated using the CKD-EPI Creatinine Equation (2021)    Anion gap 5 5 - 15    Comment: Performed at [facility], [address] [place], [place] [address]  Lactic acid, plasma     Status: None   Collection Time: 03/28/23 10:49 AM  Result Value Ref Range   Lactic Acid, Venous 1.1 0.5 - 1.9 mmol/L    Comment: Performed at [facility], [address] [place], [place] [address]  CBC with Differential     Status: Abnormal   Collection Time: 03/28/23 10:49 AM  Result Value Ref Range   WBC 17.2 (H) 4.0 - 10.5 K/uL   RBC 4.01 (L) 4.22 - 5.81 MIL/uL   Hemoglobin 11.9 (L) 13.0 - 17.0 g/dL   HCT 24.4 (L) 01.0 - 27.2 %   MCV 94.0 80.0 - 100.0 fL   MCH 29.7 26.0 - 34.0 pg   MCHC 31.6 30.0 - 36.0  g/dL   RDW 16.1 09.6 - 04.5 %   Platelets 372 150 - 400 K/uL   nRBC 0.0 0.0 - 0.2 %   Neutrophils Relative % 83 %   Neutro Abs 14.3 (H) 1.7 - 7.7 K/uL   Lymphocytes Relative 3 %   Lymphs Abs 0.5 (L) 0.7 - 4.0 K/uL   Monocytes Relative 12 %   Monocytes Absolute 2.0 (H) 0.1 - 1.0 K/uL   Eosinophils Relative 0 %   Eosinophils Absolute 0.0 0.0 - 0.5 K/uL   Basophils Relative 0 %   Basophils Absolute 0.1 0.0 - 0.1 K/uL   Immature Granulocytes 2 %   Abs Immature Granulocytes 0.34 (H) 0.00 - 0.07 K/uL    Comment: Performed at [facility], [address] [place], [place] [address]  Culture, blood  (Routine x 2)     Status: None   Collection Time: 03/28/23 10:49 AM   Specimen: BLOOD  Result Value Ref Range   Specimen Description BLOOD LEFT ARM    Special Requests      BOTTLES DRAWN AEROBIC AND ANAEROBIC Blood Culture adequate volume   Culture      NO GROWTH 5 DAYS Performed at [facility], [address] [place], [place] [address]    Report Status 04/02/2023 FINAL   Brain natriuretic peptide     Status: Abnormal   Collection Time: 03/28/23 10:49 AM  Result Value Ref Range   B Natriuretic Peptide 512.2 (H) 0.0 - 100.0 pg/mL    Comment: Performed at [facility], [address] [place], [place] [address]  Procalcitonin     Status: None   Collection Time: 03/28/23 10:49 AM  Result Value Ref Range   Procalcitonin <0.10 ng/mL    Comment:        Interpretation: PCT (Procalcitonin) <= 0.5 ng/mL: Systemic infection (sepsis) is not likely. Local bacterial infection is possible. (NOTE)       Sepsis PCT Algorithm           Lower Respiratory Tract                                      Infection PCT Algorithm    ----------------------------     ----------------------------         PCT < 0.25 ng/mL                PCT < 0.10 ng/mL          Strongly encourage             Strongly discourage   discontinuation of antibiotics    initiation of antibiotics    ----------------------------     -----------------------------       PCT 0.25 - 0.50 ng/mL            PCT 0.10 - 0.25 ng/mL               OR       >80% decrease in PCT            Discourage initiation of                                            antibiotics      Encourage discontinuation  of antibiotics    ----------------------------     -----------------------------         PCT >= 0.50 ng/mL              PCT 0.26 - 0.50 ng/mL               AND        <80% decrease in PCT             Encourage initiation of                                             antibiotics       Encourage continuation            of antibiotics    ----------------------------     -----------------------------        PCT >= 0.50 ng/mL                  PCT > 0.50 ng/mL               AND         increase in PCT                  Strongly encourage                                      initiation of antibiotics    Strongly encourage escalation           of antibiotics                                     -----------------------------                                           PCT <= 0.25 ng/mL                                                 OR                                        > 80% decrease in PCT                                      Discontinue / Do not initiate                                             antibiotics  Performed at [facility], [address] [place], [place] [address]   Resp panel by RT-PCR (RSV, Flu A&B, Covid) Anterior Nasal Swab     Status: None   Collection Time: 03/28/23 10:50 AM   Specimen: Anterior Nasal Swab  Result Value Ref Range   SARS Coronavirus 2 by RT PCR NEGATIVE NEGATIVE    Comment: (NOTE) SARS-CoV-2 target nucleic acids are NOT DETECTED.  The SARS-CoV-2 RNA is generally detectable in upper respiratory specimens during the acute phase of infection. The lowest concentration of SARS-CoV-2 viral copies this assay can detect is 138 copies/mL. A negative result does not preclude SARS-Cov-2 infection and should not be used as the sole basis for treatment or other patient management decisions. A negative result may occur with  improper specimen collection/handling, submission of specimen other than nasopharyngeal swab, presence of viral mutation(Aleiah Mohammed) within the areas targeted by this assay, and inadequate number of viral copies(<138 copies/mL). A negative result must be combined with clinical observations, patient history, and epidemiological information. The expected result is Negative.  Fact Sheet for Patients:  BloggerCourse.com  Fact  Sheet for Healthcare Providers:  SeriousBroker.it  This test is no t yet approved or cleared by the [place] FDA and  has been authorized for detection and/or diagnosis of SARS-CoV-2 by FDA under an Emergency Use Authorization (EUA). This EUA will remain  in effect (meaning this test can be used) for the duration of the COVID-19 declaration under Section 564(b)(1) of the Act, 21 U.Kiyomi Pallo.C.section 360bbb-3(b)(1), unless the authorization is terminated  or revoked sooner.       Influenza A by PCR NEGATIVE NEGATIVE   Influenza B by PCR NEGATIVE NEGATIVE    Comment: (NOTE) The Xpert Xpress SARS-CoV-2/FLU/RSV plus assay is intended as an aid in the diagnosis of influenza from Nasopharyngeal swab specimens and should not be used as a sole basis for treatment. Nasal washings and aspirates are unacceptable for Xpert Xpress SARS-CoV-2/FLU/RSV testing.  Fact Sheet for Patients: BloggerCourse.com  Fact Sheet for Healthcare Providers: SeriousBroker.it  This test is not yet approved or cleared by the [place] FDA and has been authorized for detection and/or diagnosis of SARS-CoV-2 by FDA under an Emergency Use Authorization (EUA). This EUA will remain in effect (meaning this test can be used) for the duration of the COVID-19 declaration under Section 564(b)(1) of the Act, 21 U.Kaprice Kage.C. section 360bbb-3(b)(1), unless the authorization is terminated or revoked.     Resp Syncytial Virus by PCR NEGATIVE NEGATIVE    Comment: (NOTE) Fact Sheet for Patients: BloggerCourse.com  Fact Sheet for Healthcare Providers: SeriousBroker.it  This test is not yet approved or cleared by the [place] FDA and has been authorized for detection and/or diagnosis of SARS-CoV-2 by FDA under an Emergency Use Authorization (EUA). This EUA will remain in effect (meaning this test can  be used) for the duration of the COVID-19 declaration under Section 564(b)(1) of the Act, 21 U.Aylani Spurlock.C. section 360bbb-3(b)(1), unless the authorization is terminated or revoked.  Performed at [facility], [address] [place], [place] [address]   Respiratory (~20 pathogens) panel by PCR     Status: Abnormal   Collection Time: 03/28/23 10:50 AM   Specimen: Nasopharyngeal Swab; Respiratory  Result Value Ref Range   Adenovirus NOT DETECTED NOT DETECTED   Coronavirus 229E DETECTED (A) NOT DETECTED    Comment: (NOTE) The Coronavirus on the Respiratory Panel, DOES NOT test for the novel  Coronavirus (2019 nCoV)    Coronavirus HKU1 NOT DETECTED NOT DETECTED   Coronavirus NL63 NOT DETECTED NOT DETECTED   Coronavirus OC43 NOT DETECTED NOT DETECTED   Metapneumovirus NOT DETECTED NOT DETECTED   Rhinovirus / Enterovirus NOT DETECTED NOT DETECTED   Influenza A NOT DETECTED NOT DETECTED  Influenza B NOT DETECTED NOT DETECTED   Parainfluenza Virus 1 NOT DETECTED NOT DETECTED   Parainfluenza Virus 2 NOT DETECTED NOT DETECTED   Parainfluenza Virus 3 NOT DETECTED NOT DETECTED   Parainfluenza Virus 4 NOT DETECTED NOT DETECTED   Respiratory Syncytial Virus NOT DETECTED NOT DETECTED   Bordetella pertussis NOT DETECTED NOT DETECTED   Bordetella Parapertussis NOT DETECTED NOT DETECTED   Chlamydophila pneumoniae NOT DETECTED NOT DETECTED   Mycoplasma pneumoniae NOT DETECTED NOT DETECTED    Comment: Performed at [facility], [address], [place], [place] [address]  Culture, blood (Routine x 2)     Status: None   Collection Time: 03/28/23  1:25 PM   Specimen: BLOOD  Result Value Ref Range   Specimen Description BLOOD LEFT HAND    Special Requests      BOTTLES DRAWN AEROBIC AND ANAEROBIC Blood Culture adequate volume   Culture      NO GROWTH 5 DAYS Performed at [facility], [address] [place], [place] [address]    Report Status 04/02/2023 FINAL   Urinalysis,  Routine w reflex microscopic -Urine, Clean Catch     Status: Abnormal   Collection Time: 03/28/23  1:25 PM  Result Value Ref Range   Color, Urine YELLOW (A) YELLOW   APPearance CLEAR (A) CLEAR   Specific Gravity, Urine 1.020 1.005 - 1.030   pH 5.0 5.0 - 8.0   Glucose, UA NEGATIVE NEGATIVE mg/dL   Hgb urine dipstick MODERATE (A) NEGATIVE   Bilirubin Urine NEGATIVE NEGATIVE   Ketones, ur NEGATIVE NEGATIVE mg/dL   Protein, ur 30 (A) NEGATIVE mg/dL   Nitrite NEGATIVE NEGATIVE   Leukocytes,Ua NEGATIVE NEGATIVE   RBC / HPF 0-5 0 - 5 RBC/hpf   WBC, UA 0-5 0 - 5 WBC/hpf   Bacteria, UA NONE SEEN NONE SEEN   Squamous Epithelial / HPF 0-5 0 - 5 /HPF   Mucus PRESENT     Comment: Performed at [facility], [address] [place], [place] [address]  Hepatitis panel, acute     Status: None   Collection Time: 03/28/23  4:05 PM  Result Value Ref Range   Hepatitis B Surface Ag NON REACTIVE NON REACTIVE   HCV Ab NON REACTIVE NON REACTIVE    Comment: (NOTE) Nonreactive HCV antibody screen is consistent with no HCV infections,  unless recent infection is suspected or other evidence exists to indicate HCV infection.     Hep A IgM NON REACTIVE NON REACTIVE   Hep B C IgM NON REACTIVE NON REACTIVE    Comment: Performed at [facility], [address], [place], [place] [address]  Expectorated Sputum Assessment w Gram Stain, Rflx to Resp Cult     Status: None   Collection Time: 03/28/23  7:00 PM   Specimen: Sputum  Result Value Ref Range   Specimen Description SPUTUM    Special Requests EXPSU    Sputum evaluation      THIS SPECIMEN IS ACCEPTABLE FOR SPUTUM CULTURE Performed at [facility], [address], [place], [place] [address]    Report Status 03/28/2023 FINAL   Culture, Respiratory w Gram Stain     Status: None   Collection Time: 03/28/23  7:00 PM   Specimen: SPU  Result Value Ref Range   Specimen Description      SPUTUM Performed at [facility],  [address], [place], [place] [address]    Special Requests  EXPSU Reflexed from [phone] Performed at [facility], [address] [place], [place] [address]    Gram Stain      RARE WBC PRESENT, PREDOMINANTLY PMN FEW GRAM POSITIVE COCCI    Culture      RARE Normal respiratory flora-no Staph aureus or Pseudomonas seen Performed at [facility], [address], [place], [place] [address]    Report Status 03/31/2023 FINAL   CBC     Status: Abnormal   Collection Time: 03/29/23  6:06 AM  Result Value Ref Range   WBC 17.4 (H) 4.0 - 10.5 K/uL   RBC 3.97 (L) 4.22 - 5.81 MIL/uL   Hemoglobin 11.5 (L) 13.0 - 17.0 g/dL   HCT 14.7 (L) 82.9 - 56.2 %   MCV 94.0 80.0 - 100.0 fL   MCH 29.0 26.0 - 34.0 pg   MCHC 30.8 30.0 - 36.0 g/dL   RDW 13.0 86.5 - 78.4 %   Platelets 330 150 - 400 K/uL   nRBC 0.0 0.0 - 0.2 %    Comment: Performed at [facility], [address] [place], [place] [address]  Glucose, capillary     Status: Abnormal   Collection Time: 03/29/23 11:31 PM  Result Value Ref Range   Glucose-Capillary 233 (H) 70 - 99 mg/dL    Comment: Glucose reference range applies only to samples taken after fasting for at least 8 hours.   Comment 1 Notify RN   Glucose, capillary     Status: Abnormal   Collection Time: 03/30/23  4:06 AM  Result Value Ref Range   Glucose-Capillary 187 (H) 70 - 99 mg/dL    Comment: Glucose reference range applies only to samples taken after fasting for at least 8 hours.   Comment 1 Notify RN   CBC     Status: Abnormal   Collection Time: 03/30/23  6:09 AM  Result Value Ref Range   WBC 13.6 (H) 4.0 - 10.5 K/uL   RBC 3.83 (L) 4.22 - 5.81 MIL/uL   Hemoglobin 11.1 (L) 13.0 - 17.0 g/dL   HCT 52.8 (L) 41.3 - 24.4 %   MCV 94.0 80.0 - 100.0 fL   MCH 29.0 26.0 - 34.0 pg   MCHC 30.8 30.0 - 36.0 g/dL   RDW 01.0 27.2 - 53.6 %   Platelets 345 150 - 400 K/uL   nRBC 0.0 0.0 - 0.2 %    Comment: Performed at [facility], [address] [place], [place] [address]  Comprehensive metabolic panel     Status: Abnormal   Collection Time: 03/30/23  6:09 AM  Result Value Ref Range   Sodium 137 135 - 145 mmol/L   Potassium 4.4 3.5 - 5.1 mmol/L   Chloride 101 98 - 111 mmol/L   CO2 29 22 - 32 mmol/L   Glucose, Bld 167 (H) 70 - 99 mg/dL    Comment: Glucose reference range applies only to samples taken after fasting for at least 8 hours.   BUN 20 8 - 23 mg/dL   Creatinine, Ser 4.74 0.61 - 1.24 mg/dL   Calcium 8.5 (L) 8.9 - 10.3 mg/dL   Total Protein 6.1 (L) 6.5 - 8.1 g/dL   Albumin 2.6 (L) 3.5 - 5.0 g/dL   AST 47 (H) 15 - 41 U/L   ALT 103 (H) 0 - 44 U/L   Alkaline Phosphatase 88 38 - 126 U/L   Total Bilirubin 0.5 0.3 - 1.2 mg/dL   GFR, Estimated >25 >  60 mL/min    Comment: (NOTE) Calculated using the CKD-EPI Creatinine Equation (2021)    Anion gap 7 5 - 15    Comment: Performed at [facility], [address] [place], [place] [address]  Basic metabolic panel     Status: Abnormal   Collection Time: 03/31/23  4:37 AM  Result Value Ref Range   Sodium 137 135 - 145 mmol/L   Potassium 4.6 3.5 - 5.1 mmol/L   Chloride 99 98 - 111 mmol/L   CO2 28 22 - 32 mmol/L   Glucose, Bld 208 (H) 70 - 99 mg/dL    Comment: Glucose reference range applies only to samples taken after fasting for at least 8 hours.   BUN 28 (H) 8 - 23 mg/dL   Creatinine, Ser 1.91 0.61 - 1.24 mg/dL   Calcium 8.3 (L) 8.9 - 10.3 mg/dL   GFR, Estimated >47 >82 mL/min    Comment: (NOTE) Calculated using the CKD-EPI Creatinine Equation (2021)    Anion gap 10 5 - 15    Comment: Performed at [facility], [address] [place], [place] [address]  CBC     Status: Abnormal   Collection Time: 03/31/23  4:37 AM  Result Value Ref Range   WBC 22.3 (H) 4.0 - 10.5 K/uL   RBC 3.82 (L) 4.22 - 5.81 MIL/uL   Hemoglobin 11.1 (L) 13.0 - 17.0 g/dL   HCT 30.8 (L) 65.7 - 84.6 %   MCV 92.9 80.0 - 100.0 fL   MCH 29.1 26.0 - 34.0 pg   MCHC 31.3  30.0 - 36.0 g/dL   RDW 96.2 95.2 - 84.1 %   Platelets 376 150 - 400 K/uL   nRBC 0.0 0.0 - 0.2 %    Comment: Performed at [facility], [address], [place], [place] [address]  Magnesium     Status: None   Collection Time: 03/31/23  4:37 AM  Result Value Ref Range   Magnesium 2.1 1.7 - 2.4 mg/dL    Comment: Performed at [facility], [address] [place], [place] [address]  Basic metabolic panel     Status: Abnormal   Collection Time: 04/01/23  6:16 AM  Result Value Ref Range   Sodium 139 135 - 145 mmol/L   Potassium 4.2 3.5 - 5.1 mmol/L   Chloride 101 98 - 111 mmol/L   CO2 29 22 - 32 mmol/L   Glucose, Bld 108 (H) 70 - 99 mg/dL    Comment: Glucose reference range applies only to samples taken after fasting for at least 8 hours.   BUN 23 8 - 23 mg/dL   Creatinine, Ser 5.36 0.61 - 1.24 mg/dL   Calcium 8.2 (L) 8.9 - 10.3 mg/dL   GFR, Estimated >64 >40 mL/min    Comment: (NOTE) Calculated using the CKD-EPI Creatinine Equation (2021)    Anion gap 9 5 - 15    Comment: Performed at [facility], [address] [place], [place] [address]  CBC     Status: Abnormal   Collection Time: 04/01/23  6:16 AM  Result Value Ref Range   WBC 20.4 (H) 4.0 - 10.5 K/uL   RBC 3.83 (L) 4.22 - 5.81 MIL/uL   Hemoglobin 10.9 (L) 13.0 - 17.0 g/dL   HCT 59.5 (L) 63.8 - 75.6 %   MCV 92.7 80.0 - 100.0 fL   MCH 28.5 26.0 - 34.0 pg   MCHC 30.7 30.0 - 36.0 g/dL   RDW 43.3  11.5 - 15.5 %   Platelets 402 (H) 150 - 400 K/uL   nRBC 0.2 0.0 - 0.2 %    Comment: Performed at [facility], [address] [place], [place] [address]  Magnesium     Status: None   Collection Time: 04/01/23  6:16 AM  Result Value Ref Range   Magnesium 2.2 1.7 - 2.4 mg/dL    Comment: Performed at [facility], [address] [place], [place] [address]  Basic metabolic panel     Status: Abnormal   Collection Time: 04/02/23  5:20 AM  Result Value Ref Range   Sodium 139 135 - 145  mmol/L   Potassium 4.6 3.5 - 5.1 mmol/L   Chloride 98 98 - 111 mmol/L   CO2 33 (H) 22 - 32 mmol/L   Glucose, Bld 162 (H) 70 - 99 mg/dL    Comment: Glucose reference range applies only to samples taken after fasting for at least 8 hours.   BUN 23 8 - 23 mg/dL   Creatinine, Ser 8.29 0.61 - 1.24 mg/dL   Calcium 8.4 (L) 8.9 - 10.3 mg/dL   GFR, Estimated >56 >21 mL/min    Comment: (NOTE) Calculated using the CKD-EPI Creatinine Equation (2021)    Anion gap 8 5 - 15    Comment: Performed at [facility], [address] [place], [place] [address]  CBC     Status: Abnormal   Collection Time: 04/02/23  5:20 AM  Result Value Ref Range   WBC 17.9 (H) 4.0 - 10.5 K/uL   RBC 4.11 (L) 4.22 - 5.81 MIL/uL   Hemoglobin 11.7 (L) 13.0 - 17.0 g/dL   HCT 78.4 (L) 69.6 - 29.5 %   MCV 92.9 80.0 - 100.0 fL   MCH 28.5 26.0 - 34.0 pg   MCHC 30.6 30.0 - 36.0 g/dL   RDW 28.4 13.2 - 44.0 %   Platelets 447 (H) 150 - 400 K/uL   nRBC 0.0 0.0 - 0.2 %    Comment: Performed at [facility], [address], [place], [place] [address]  Magnesium     Status: None   Collection Time: 04/02/23  5:20 AM  Result Value Ref Range   Magnesium 2.3 1.7 - 2.4 mg/dL    Comment: Performed at [facility], [address] [place], [place] [address]  Basic metabolic panel     Status: Abnormal   Collection Time: 04/03/23  5:39 AM  Result Value Ref Range   Sodium 137 135 - 145 mmol/L   Potassium 4.3 3.5 - 5.1 mmol/L   Chloride 94 (L) 98 - 111 mmol/L   CO2 36 (H) 22 - 32 mmol/L   Glucose, Bld 123 (H) 70 - 99 mg/dL    Comment: Glucose reference range applies only to samples taken after fasting for at least 8 hours.   BUN 26 (H) 8 - 23 mg/dL   Creatinine, Ser 4.03 0.61 - 1.24 mg/dL   Calcium 8.3 (L) 8.9 - 10.3 mg/dL   GFR, Estimated >47 >42 mL/min    Comment: (NOTE) Calculated using the CKD-EPI Creatinine Equation (2021)    Anion gap 7 5 - 15    Comment: Performed at [facility], [address] [place], [place] [address]  CBC     Status: Abnormal   Collection Time: 04/03/23  5:39 AM  Result Value Ref Range   WBC 21.3 (H) 4.0 - 10.5 K/uL   RBC 4.19 (L) 4.22 -  5.81 MIL/uL   Hemoglobin 12.2 (L) 13.0 - 17.0 g/dL   HCT 16.1 (L) 09.6 - 04.5 %   MCV 92.6 80.0 - 100.0 fL   MCH 29.1 26.0 - 34.0 pg   MCHC 31.4 30.0 - 36.0 g/dL   RDW 40.9 81.1 - 91.4 %   Platelets 417 (H) 150 - 400 K/uL   nRBC 0.0 0.0 - 0.2 %    Comment: Performed at [facility], [address] [place], [place] [address]  Magnesium     Status: None   Collection Time: 04/03/23  5:39 AM  Result Value Ref Range   Magnesium 2.3 1.7 - 2.4 mg/dL    Comment: Performed at [facility], [address] [place], [place] [address]  Procalcitonin     Status: None   Collection Time: 04/03/23  5:39 AM  Result Value Ref Range   Procalcitonin <0.10 ng/mL    Comment:        Interpretation: PCT (Procalcitonin) <= 0.5 ng/mL: Systemic infection (sepsis) is not likely. Local bacterial infection is possible. (NOTE)       Sepsis PCT Algorithm           Lower Respiratory Tract                                      Infection PCT Algorithm    ----------------------------     ----------------------------         PCT < 0.25 ng/mL                PCT < 0.10 ng/mL          Strongly encourage             Strongly discourage   discontinuation of antibiotics    initiation of antibiotics    ----------------------------     -----------------------------       PCT 0.25 - 0.50 ng/mL            PCT 0.10 - 0.25 ng/mL               OR       >80% decrease in PCT            Discourage initiation of                                            antibiotics      Encourage discontinuation           of antibiotics    ----------------------------     -----------------------------         PCT >= 0.50 ng/mL              PCT 0.26 - 0.50 ng/mL               AND        <80% decrease in PCT             Encourage initiation of                                              antibiotics       Encourage continuation  of antibiotics    ----------------------------     -----------------------------        PCT >= 0.50 ng/mL                  PCT > 0.50 ng/mL               AND         increase in PCT                  Strongly encourage                                      initiation of antibiotics    Strongly encourage escalation           of antibiotics                                     -----------------------------                                           PCT <= 0.25 ng/mL                                                 OR                                        > 80% decrease in PCT                                      Discontinue / Do not initiate                                             antibiotics  Performed at [facility], [address] [place], [place] [address]   SARS Coronavirus 2 by RT PCR (hospital order, performed in [facility] hospital lab) *cepheid single result test* Anterior Nasal Swab     Status: None   Collection Time: 04/03/23  9:41 AM   Specimen: Anterior Nasal Swab  Result Value Ref Range   SARS Coronavirus 2 by RT PCR NEGATIVE NEGATIVE    Comment: (NOTE) SARS-CoV-2 target nucleic acids are NOT DETECTED.  The SARS-CoV-2 RNA is generally detectable in upper and lower respiratory specimens during the acute phase of infection. The lowest concentration of SARS-CoV-2 viral copies this assay can detect is 250 copies / mL. A negative result does not preclude SARS-CoV-2 infection and should not be used as the sole basis for treatment or other patient management decisions.  A negative result may occur with improper specimen collection / handling, submission of specimen other than nasopharyngeal swab, presence of viral mutation(Zahmir Lalla) within the areas targeted by this assay, and inadequate number of viral copies (<250 copies / mL). A negative result must be combined with  clinical  observations, patient history, and epidemiological information.  Fact Sheet for Patients:   RoadLapTop.co.za  Fact Sheet for Healthcare Providers: http://kim-miller.com/  This test is not yet approved or  cleared by the [place] FDA and has been authorized for detection and/or diagnosis of SARS-CoV-2 by FDA under an Emergency Use Authorization (EUA).  This EUA will remain in effect (meaning this test can be used) for the duration of the COVID-19 declaration under Section 564(b)(1) of the Act, 21 U.[employer] [place]C. section 360bbb-3(b)(1), unless the authorization is terminated or revoked sooner.  Performed at [facility], [address] [place], [place] [address]       Assessment & Plan:   Problem List Items Addressed This Visit       Cardiovascular and Mediastinum   Coronary artery disease (Chronic)     Respiratory   CAP (community acquired pneumonia)   Other Visit Diagnoses     Diarrhea of presumed infectious origin    -  Primary   Relevant Orders   Clostridium difficile EIA   Stool culture       Return in about 1 month (around 05/17/2023).   Total time spent: 30 minutes  Luna Fuse, MD  04/17/2023

## 2023-04-21 ENCOUNTER — Telehealth: Payer: Self-pay | Admitting: Internal Medicine

## 2023-04-21 DIAGNOSIS — R197 Diarrhea, unspecified: Secondary | ICD-10-CM | POA: Diagnosis not present

## 2023-04-21 NOTE — Telephone Encounter (Signed)
Patient called inquiring if he needs to freeze his stool sample that he has already completed. Questions were answered and patient will drop off sample this morning.

## 2023-04-23 LAB — CLOSTRIDIUM DIFFICILE EIA: C difficile Toxins A+B, EIA: NEGATIVE

## 2023-04-24 ENCOUNTER — Ambulatory Visit: Payer: PPO | Admitting: Internal Medicine

## 2023-04-25 ENCOUNTER — Ambulatory Visit: Payer: Self-pay

## 2023-04-25 DIAGNOSIS — Z96652 Presence of left artificial knee joint: Secondary | ICD-10-CM | POA: Diagnosis not present

## 2023-04-25 LAB — STOOL CULTURE: E coli, Shiga toxin Assay: NEGATIVE

## 2023-04-25 NOTE — Patient Outreach (Signed)
  Care Coordination   Follow Up Visit Note   04/25/2023 Name: Nathaniel Ibarra MRN: [id number] DOB: 1941/10/30  Nathaniel Ibarra is a 82 y.o. year old male who sees Sherron Monday, MD for primary care. I spoke with  Georgeanna Harrison by phone today.  What matters to the patients health and wellness today?  Patient states he is doing well. 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  He reports his breathing has gotten better. Reports O2 saturation ranges 90-95.  He states he will use oxygen on occasion if he has worked all day and it ranges at 92 or he may use occasionally at night.  Patient denies any swelling in his feet, ankles, or legs.  Patient reports providing stool sample at doctors appointment for testing due to ongoing loose stools. He states he is waiting for results.  Patient reports having a status post TKR follow up appointment with the orthopedic doctor today. He states he is 6 months out from his surgery and is doing well.    Goals Addressed             This Visit's Progress    Continued improvement post hospitalization for pneumonia and management of health conditions       Interventions Today    Flowsheet Row Most Recent Value  Chronic Disease   Chronic disease during today's visit Congestive Heart Failure (CHF), Other  [status post pnemonia/ diarrhea]  General Interventions   General Interventions Discussed/Reviewed General Interventions Reviewed, Doctor Visits  [evaluation of current treatment plan for status post pneumonia/ diarrhea and HF and patients adherence to plan as recommended by provider. Assessed for ongoing pneumonia/ HF symptoms. Assessed for weight.]  Doctor Visits Discussed/Reviewed Doctor Visits Reviewed  Annabell Sabal scheduled / upcoming provider visits.  Reviewed most recent PCP hospital follow up visit. Assessed for ongoing pneumonia/ HF symptoms. Assessed for weight.]  Exercise Interventions   Exercise Discussed/Reviewed Physical Activity   Physical Activity Discussed/Reviewed Physical Activity Reviewed  Pearletha Furl for level of physical activity.]  Education Interventions   Education Provided Provided Education  [Advised to continuing weighing daily and record. Assess for heart failure symptoms. Report new or ongoing symptoms to provider. Contact provider office by 04/28/23 if no call back regarding stool sample results.]  Pharmacy Interventions   Pharmacy Dicussed/Reviewed Pharmacy Topics Reviewed  [medications reviewed and compliance discussed.]              SDOH assessments and interventions completed:  No     Care Coordination Interventions:  Yes, provided   Follow up plan: Follow up call scheduled for 05/30/23    Encounter Outcome:  Pt. Visit Completed   George Ina RN,BSN,CCM [facility] Care Coordination [phone] direct line

## 2023-04-25 NOTE — Patient Instructions (Signed)
Visit Information  Thank you for taking time to visit with me today. Please don't hesitate to contact me if I can be of assistance to you.   Following are the goals we discussed today:  Keep follow up appointments with providers Continue to monitor weight daily and record. Notify provider for  3 lb weight gain overnight or 5 lb weight gain in a week.  Monitor for heart failure symptoms of Shortness of breath, swelling in lower legs/ feet or increase weight.  Notify provider for new or ongoing symptoms.  Continue to take your medications as prescribed.  Contact your provider by 04/28/23 if you have not heard back regarding your stool test.  Continue to remain active as tolerated.    Our next appointment is by telephone on 05/30/23 at 10 am  Please call the care guide team at [phone] if you need to cancel or reschedule your appointment.   If you are experiencing a Mental Health or Behavioral Health Crisis or need someone to talk to, please call the Suicide and Crisis Lifeline: 988 call 1-800-273-TALK (toll free, 24 hour hotline)  Patient verbalizes understanding of instructions and care plan provided today and agrees to view in MyChart. Active MyChart status and patient understanding of how to access instructions and care plan via MyChart confirmed with patient.     George Ina RN,BSN,CCM [facility] Care Coordination [phone] direct line

## 2023-05-07 ENCOUNTER — Emergency Department: Payer: PPO

## 2023-05-07 ENCOUNTER — Emergency Department
Admission: EM | Admit: 2023-05-07 | Discharge: 2023-05-07 | Disposition: A | Discharge status: Home / Self Care | Payer: PPO | Attending: Emergency Medicine | Admitting: Emergency Medicine

## 2023-05-07 ENCOUNTER — Encounter: Payer: Self-pay | Admitting: Emergency Medicine

## 2023-05-07 ENCOUNTER — Other Ambulatory Visit: Payer: Self-pay

## 2023-05-07 DIAGNOSIS — I509 Heart failure, unspecified: Secondary | ICD-10-CM | POA: Insufficient documentation

## 2023-05-07 DIAGNOSIS — R197 Diarrhea, unspecified: Secondary | ICD-10-CM | POA: Insufficient documentation

## 2023-05-07 DIAGNOSIS — K5730 Diverticulosis of large intestine without perforation or abscess without bleeding: Secondary | ICD-10-CM | POA: Diagnosis not present

## 2023-05-07 DIAGNOSIS — R112 Nausea with vomiting, unspecified: Secondary | ICD-10-CM | POA: Insufficient documentation

## 2023-05-07 LAB — COMPREHENSIVE METABOLIC PANEL
ALT: 16 U/L (ref 0–44)
AST: 20 U/L (ref 15–41)
Albumin: 3.7 g/dL (ref 3.5–5.0)
Alkaline Phosphatase: 81 U/L (ref 38–126)
Anion gap: 7 (ref 5–15)
BUN: 26 mg/dL — ABNORMAL HIGH (ref 8–23)
CO2: 27 mmol/L (ref 22–32)
Calcium: 8.1 mg/dL — ABNORMAL LOW (ref 8.9–10.3)
Chloride: 103 mmol/L (ref 98–111)
Creatinine, Ser: 1.01 mg/dL (ref 0.61–1.24)
GFR, Estimated: >60 mL/min (ref >60)
Glucose, Bld: 85 mg/dL (ref 70–99)
Potassium: 4.2 mmol/L (ref 3.5–5.1)
Sodium: 137 mmol/L (ref 135–145)
Total Bilirubin: 0.7 mg/dL (ref 0.3–1.2)
Total Protein: 6.3 g/dL — ABNORMAL LOW (ref 6.5–8.1)

## 2023-05-07 LAB — CBC
HCT: 41.5 % (ref 39.0–52.0)
Hemoglobin: 13 g/dL (ref 13.0–17.0)
MCH: 29.7 pg (ref 26.0–34.0)
MCHC: 31.3 g/dL (ref 30.0–36.0)
MCV: 94.7 fL (ref 80.0–100.0)
Platelets: 341 10*3/uL (ref 150–400)
RBC: 4.38 MIL/uL (ref 4.22–5.81)
RDW: 14.9 % (ref 11.5–15.5)
WBC: 11 10*3/uL — ABNORMAL HIGH (ref 4.0–10.5)
nRBC: 0 % (ref 0.0–0.2)

## 2023-05-07 LAB — LIPASE, BLOOD: Lipase: 23 U/L (ref 11–51)

## 2023-05-07 MED ORDER — FAMOTIDINE 20 MG PO TABS: 20.0000 mg | ORAL_TABLET | Freq: Two times a day (BID) | ORAL | 0 refills | Status: AC

## 2023-05-07 MED ORDER — ONDANSETRON 4 MG PO TBDP: 4.0000 mg | ORAL_TABLET | Freq: Three times a day (TID) | ORAL | 0 refills | Status: DC | PRN

## 2023-05-07 MED ORDER — IOHEXOL 300 MG/ML  SOLN
100.0000 mL | Freq: Once | INTRAMUSCULAR | Status: AC | PRN
  Administered 2023-05-07: 100 mL via INTRAVENOUS

## 2023-05-07 NOTE — ED Provider Notes (Signed)
[facility] Provider Note    Event Date/Time   First MD Initiated Contact with Patient 05/07/23 2001     (approximate)   History   Chief Complaint: Diarrhea and Emesis   HPI  HAYGEN BROCK is a 82 y.o. male with a history of hypertension NSTEMI GERD heart failure BPH who comes to the ED due to vomiting last night.  Patient reports he has had loose, semiformed bowel movements approximately 5 times a day for the past 3 weeks.  He still eating and drinking okay, no fever.  No black or bloody stool.  Yesterday he took a dose of Imodium for the first time, ate potato soup for dinner, and then in the middle the night had several episodes of emesis.  Currently he feels fine.  Denies abdominal pain nausea or vomiting.  Had a normal bowel movement this morning and none since.     Physical Exam   Triage Vital Signs: ED Triage Vitals  Enc Vitals Group     BP 05/07/23 1321 (!) 123/57     Pulse Rate 05/07/23 1321 (!) 57     Resp 05/07/23 1321 16     Temp 05/07/23 1321 98.4 F (36.9 C)     Temp Source 05/07/23 1321 Oral     SpO2 05/07/23 1321 94 %     Weight 05/07/23 1322 195 lb (88.5 kg)     Height 05/07/23 1322 6' (1.829 m)     Head Circumference --      Peak Flow --      Pain Score 05/07/23 1322 0     Pain Loc --      Pain Edu? --      Excl. in GC? --     Most recent vital signs: Vitals:   05/07/23 1321 05/07/23 1644  BP: (!) 123/57 111/64  Pulse: (!) 57 (!) 58  Resp: 16 20  Temp: 98.4 F (36.9 C) 98 F (36.7 C)  SpO2: 94% 94%    General: Awake, no distress.  CV:  Good peripheral perfusion.  Resp:  Normal effort.  Abd:  No distention.  Soft, nontender Other:  Moving without difficulty.  Energetic, comfortable lying flat and sitting upright.   ED Results / Procedures / Treatments   Labs (all labs ordered are listed, but only abnormal results are displayed) Labs Reviewed  COMPREHENSIVE METABOLIC PANEL - Abnormal; Notable for the  following components:      Result Value   BUN 26 (*)    Calcium 8.1 (*)    Total Protein 6.3 (*)    All other components within normal limits  CBC - Abnormal; Notable for the following components:   WBC 11.0 (*)    All other components within normal limits  LIPASE, BLOOD  URINALYSIS, ROUTINE W REFLEX MICROSCOPIC     EKG    RADIOLOGY CT abdomen pelvis interpreted by me, negative for bowel obstruction.  Radiology report reviewed.   PROCEDURES:  Procedures   MEDICATIONS ORDERED IN ED: Medications  iohexol (OMNIPAQUE) 300 MG/ML solution 100 mL (100 mLs Intravenous Contrast Given 05/07/23 1709)     IMPRESSION / MDM / ASSESSMENT AND PLAN / ED COURSE  I reviewed the triage vital signs and the nursing notes.  DDx: Electrolyte abnormality, AKI, anemia, diverticulitis.  Doubt GI bleed, C. difficile colitis, enteropathogenic infection, GI perforation, mesenteric ischemia, dissection, aneurysm  Patient's presentation is most consistent with acute presentation with potential threat to life or bodily function.  Patient presents with subacute loose bowel movements and then vomiting overnight after taking Imodium.  I suspect the Imodium may have caused some delayed gastric emptying resulting in the emesis.  He is feeling just fine and actually bowel function seems to have returned to normal consistency today.  Vital signs are unremarkable, he is well-hydrated, vital signs are normal, CT abdomen pelvis is unremarkable, and abdomen is benign on exam.  Stable for discharge, will prescribe Pepcid and Zofran as needed, they are in the process of reaching out to his GI doctor Servando Snare and I encouraged him to continue making a follow-up plan with him.       FINAL CLINICAL IMPRESSION(S) / ED DIAGNOSES   Final diagnoses:  Nausea and vomiting, unspecified vomiting type     Rx / DC Orders   ED Discharge Orders          Ordered    famotidine (PEPCID) 20 MG tablet  2 times daily         05/07/23 2049    ondansetron (ZOFRAN-ODT) 4 MG disintegrating tablet  Every 8 hours PRN        05/07/23 2049             Note:  This document was prepared using Dragon voice recognition software and may include unintentional dictation errors.   Sharman Cheek, MD 05/07/23 2055

## 2023-05-07 NOTE — ED Provider Triage Note (Signed)
Emergency Medicine Provider Triage Evaluation Note  Nathaniel Ibarra , a 82 y.o. male  was evaluated in triage.  Pt complains of abd pain and diarrhea for 3 weeks - slightly elevated WBC .  Review of Systems  Positive: Diarrhea - abd pain Negative: Fever - N/V  Physical Exam  BP 111/64 (BP Location: Left Arm)   Pulse (!) 58   Temp 98 F (36.7 C) (Oral)   Resp 20   Ht 6' (1.829 m)   Wt 88.5 kg   SpO2 94%   BMI 26.45 kg/m  Gen:   Awake, no distress   Resp:  Normal effort  MSK:   Moves extremities without difficulty  Other:    Medical Decision Making  Medically screening exam initiated at 4:56 PM.  Appropriate orders placed.  Nathaniel Ibarra was informed that the remainder of the evaluation will be completed by another provider, this initial triage assessment does not replace that evaluation, and the importance of remaining in the ED until their evaluation is complete.  Ordered CT abd/pelvis   Herschell Dimes, NP 05/07/23 1657

## 2023-05-07 NOTE — ED Triage Notes (Signed)
Pt to ED via POV. Pt states that he has had diarrhea x 3 weeks. Pt reports that last night he started vomiting. Pt has not been able to eat much this morning. Pt is in NAD.

## 2023-05-11 ENCOUNTER — Telehealth: Payer: Self-pay

## 2023-05-11 NOTE — Telephone Encounter (Signed)
Patient wife called which is name [name] and she is not on patient DPR so the patient gave me approval to talk to her. She states that her husband is a Dr. Servando Snare patient and she called and left a message on the main line on Friday morning and no one return her call. She states patient was in the hospital a couple months ago with Pneumonia and was discharge and started having diarrhea. They told him it was from the pneumonia but it has not gone away. She states she called Friday to get a appointment with Dr. Servando Snare and no one return her call. She then said she gave him Imodium Friday and he began to throw up. She states she took him to the ER on Sunday and they did a CT scan and everything came back negative. She states something is going on. Offered her a appointment for him to see our PA tina tomorrow morning at 9:15am but she declined she states he needs to only see a MD and needs to see Dr. Servando Snare today or tomorrow because it is our fault no one return her call. Informed her I am offering her a appointment with our PA tomorrow morning and that she can see him and order what ever labs she feels like he needs and if she feels like he needs any procedures she will scheduled with Dr. Servando Snare. She states no she will call and find a new GI provider that will see him today or tomorrow.  Informed patient wife I would let my office manger know of the situation and would send a message to Dr. Servando Snare  nurse also to let her know his symptoms.

## 2023-05-11 NOTE — Telephone Encounter (Signed)
Has appt for 05/30/2023-- ok per Dr Servando Snare

## 2023-05-13 DIAGNOSIS — G4733 Obstructive sleep apnea (adult) (pediatric): Secondary | ICD-10-CM | POA: Diagnosis not present

## 2023-05-13 DIAGNOSIS — I5032 Chronic diastolic (congestive) heart failure: Secondary | ICD-10-CM | POA: Diagnosis not present

## 2023-05-13 DIAGNOSIS — R531 Weakness: Secondary | ICD-10-CM | POA: Diagnosis not present

## 2023-05-13 DIAGNOSIS — R062 Wheezing: Secondary | ICD-10-CM | POA: Diagnosis not present

## 2023-05-13 DIAGNOSIS — U071 COVID-19: Secondary | ICD-10-CM | POA: Diagnosis not present

## 2023-05-17 ENCOUNTER — Ambulatory Visit: Payer: PPO | Admitting: Dermatology

## 2023-05-17 ENCOUNTER — Telehealth: Payer: Self-pay

## 2023-05-17 VITALS — BP 130/60

## 2023-05-17 DIAGNOSIS — L821 Other seborrheic keratosis: Secondary | ICD-10-CM

## 2023-05-17 DIAGNOSIS — L570 Actinic keratosis: Secondary | ICD-10-CM

## 2023-05-17 DIAGNOSIS — W908XXA Exposure to other nonionizing radiation, initial encounter: Secondary | ICD-10-CM

## 2023-05-17 DIAGNOSIS — X32XXXA Exposure to sunlight, initial encounter: Secondary | ICD-10-CM | POA: Diagnosis not present

## 2023-05-17 DIAGNOSIS — L820 Inflamed seborrheic keratosis: Secondary | ICD-10-CM | POA: Diagnosis not present

## 2023-05-17 DIAGNOSIS — L578 Other skin changes due to chronic exposure to nonionizing radiation: Secondary | ICD-10-CM | POA: Diagnosis not present

## 2023-05-17 DIAGNOSIS — C44629 Squamous cell carcinoma of skin of left upper limb, including shoulder: Secondary | ICD-10-CM | POA: Diagnosis not present

## 2023-05-17 DIAGNOSIS — D485 Neoplasm of uncertain behavior of skin: Secondary | ICD-10-CM

## 2023-05-17 NOTE — Patient Instructions (Signed)
Cryotherapy Aftercare  Wash gently with soap and water everyday.   Apply Vaseline and Band-Aid daily until healed.   Shave Excision Benign Lesion Wound Care Instructions  Leave the original bandage on for 24 hours if possible.  If the bandage becomes soaked or soiled before that time, it is OK to remove it and examine the wound.  A small amount of post-operative bleeding is normal.  If excessive bleeding occurs, remove the bandage, place gauze over the site and apply continuous pressure (no peeking) over the area for 20-30 minutes.  If this does not stop the bleeding, try again for 40 minutes.  If this does not work, please call our clinic as soon as possible (even if after-hours).    Twice a day, cleanse the wound with soap and water.  If a thick crust develops you may use a Q-tip dipped into dilute hydrogen peroxide (mix 1:1 with water) to dissolve it.  Hydrogen peroxide can slow the healing process, so use it only as needed.  After washing, apply Vaseline jelly or Polysporin ointment.  For best healing, the wound should be covered with a layer of ointment at all times.  This may mean re-applying the ointment several times a day.  For open wounds, continue until it has healed.    If you have any swelling, keep the area elevated.  Some redness, tenderness and white or yellow material in the wound is normal healing.  If the area becomes very sore and red, or develops a thick yellow-green material (pus), it may be infected; please notify us.    Wound healing continues for up to one year following surgery.  It is not unusual to experience pain in the scar from time to time during the interval.  If the pain becomes severe or the scar thickens, you should notify the office.  A slight amount of redness in a scar is expected for the first six months.  After six months, the redness subsides and the scar will soften and fade.  The color difference becomes less noticeable with time.  If there are any problems,  return for a post-op surgery check at your earliest convenience.  Please call our office for any questions or concerns.    Due to recent changes in healthcare laws, you may see results of your pathology and/or laboratory studies on MyChart before the doctors have had a chance to review them. We understand that in some cases there may be results that are confusing or concerning to you. Please understand that not all results are received at the same time and often the doctors may need to interpret multiple results in order to provide you with the best plan of care or course of treatment. Therefore, we ask that you please give us 2 business days to thoroughly review all your results before contacting the office for clarification. Should we see a critical lab result, you will be contacted sooner.   If You Need Anything After Your Visit  If you have any questions or concerns for your doctor, please call our main line at [phone] and press option 4 to reach your doctor's medical assistant. If no one answers, please leave a voicemail as directed and we will return your call as soon as possible. Messages left after 4 pm will be answered the following business day.   You may also send us a message via MyChart. We typically respond to MyChart messages within 1-2 business days.  For prescription refills, please ask your   pharmacy to contact our office. Our fax number is [phone].  If you have an urgent issue when the clinic is closed that cannot wait until the next business day, you can page your doctor at the number below.    Please note that while we do our best to be available for urgent issues outside of office hours, we are not available 24/7.   If you have an urgent issue and are unable to reach us, you may choose to seek medical care at your doctor's office, retail clinic, urgent care center, or emergency room.  If you have a medical emergency, please immediately call 911 or go to the  emergency department.  Pager Numbers  - Dr. Kowalski: [phone]  - Dr. Moye: [phone]  - Dr. Stewart: [phone]  In the event of inclement weather, please call our main line at [phone] for an update on the status of any delays or closures.  Dermatology Medication Tips: Please keep the boxes that topical medications come in in order to help keep track of the instructions about where and how to use these. Pharmacies typically print the medication instructions only on the boxes and not directly on the medication tubes.   If your medication is too expensive, please contact our office at [phone] option 4 or send us a message through MyChart.   We are unable to tell what your co-pay for medications will be in advance as this is different depending on your insurance coverage. However, we may be able to find a substitute medication at lower cost or fill out paperwork to get insurance to cover a needed medication.   If a prior authorization is required to get your medication covered by your insurance company, please allow us 1-2 business days to complete this process.  Drug prices often vary depending on where the prescription is filled and some pharmacies may offer cheaper prices.  The website www.goodrx.com contains coupons for medications through different pharmacies. The prices here do not account for what the cost may be with help from insurance (it may be cheaper with your insurance), but the website can give you the price if you did not use any insurance.  - You can print the associated coupon and take it with your prescription to the pharmacy.  - You may also stop by our office during regular business hours and pick up a GoodRx coupon card.  - If you need your prescription sent electronically to a different pharmacy, notify our office through [facility] MyChart or by phone at [phone] option 4.     Si Usted Necesita Algo Despus de Su Visita  Tambin puede  enviarnos un mensaje a travs de MyChart. Por lo general respondemos a los mensajes de MyChart en el transcurso de 1 a 2 das hbiles.  Para renovar recetas, por favor pida a su farmacia que se ponga en contacto con nuestra oficina. Nuestro nmero de fax es el [phone].  Si tiene un asunto urgente cuando la clnica est cerrada y que no puede esperar hasta el siguiente da hbil, puede llamar/localizar a su doctor(a) al nmero que aparece a continuacin.   Por favor, tenga en cuenta que aunque hacemos todo lo posible para estar disponibles para asuntos urgentes fuera del horario de oficina, no estamos disponibles las 24 horas del da, los 7 das de la semana.   Si tiene un problema urgente y no puede comunicarse con nosotros, puede optar por buscar atencin mdica  en el consultorio de su doctor(a), en   una clnica privada, en un centro de atencin urgente o en una sala de emergencias.  Si tiene una emergencia mdica, por favor llame inmediatamente al 911 o vaya a la sala de emergencias.  Nmeros de bper  - Dr. Kowalski: [phone]  - Dra. Moye: [phone]  - Dra. Stewart: [phone]  En caso de inclemencias del tiempo, por favor llame a nuestra lnea principal al [phone] para una actualizacin sobre el estado de cualquier retraso o cierre.  Consejos para la medicacin en dermatologa: Por favor, guarde las cajas en las que vienen los medicamentos de uso tpico para ayudarle a seguir las instrucciones sobre dnde y cmo usarlos. Las farmacias generalmente imprimen las instrucciones del medicamento slo en las cajas y no directamente en los tubos del medicamento.   Si su medicamento es muy caro, por favor, pngase en contacto con nuestra oficina llamando al [phone] y presione la opcin 4 o envenos un mensaje a travs de MyChart.   No podemos decirle cul ser su copago por los medicamentos por adelantado ya que esto es diferente dependiendo de la cobertura de su seguro.  Sin embargo, es posible que podamos encontrar un medicamento sustituto a menor costo o llenar un formulario para que el seguro cubra el medicamento que se considera necesario.   Si se requiere una autorizacin previa para que su compaa de seguros cubra su medicamento, por favor permtanos de 1 a 2 das hbiles para completar este proceso.  Los precios de los medicamentos varan con frecuencia dependiendo del lugar de dnde se surte la receta y alguna farmacias pueden ofrecer precios ms baratos.  El sitio web www.goodrx.com tiene cupones para medicamentos de diferentes farmacias. Los precios aqu no tienen en cuenta lo que podra costar con la ayuda del seguro (puede ser ms barato con su seguro), pero el sitio web puede darle el precio si no utiliz ningn seguro.  - Puede imprimir el cupn correspondiente y llevarlo con su receta a la farmacia.  - Tambin puede pasar por nuestra oficina durante el horario de atencin regular y recoger una tarjeta de cupones de GoodRx.  - Si necesita que su receta se enve electrnicamente a una farmacia diferente, informe a nuestra oficina a travs de MyChart de [facility] o por telfono llamando al [phone] y presione la opcin 4.  

## 2023-05-17 NOTE — Telephone Encounter (Signed)
Transition Care Management Unsuccessful Follow-up Telephone Call  Date of discharge and from where:  [place] 5/19  Attempts:  1st Attempt  Reason for unsuccessful TCM follow-up call:  Unable to leave message   Nathaniel Ibarra [facility] Guide, [facility] [phone] [address] [place], [place], [place] [address] Phone: [phone] Email: [email or web address]

## 2023-05-17 NOTE — Progress Notes (Signed)
Follow-Up Visit   Subjective  Nathaniel Ibarra is a 82 y.o. male who presents for the following: 9 month AK follow up  The patient has spots, moles and lesions to be evaluated, some may be new or changing and the patient may have concern these could be cancer.  The following portions of the chart were reviewed this encounter and updated as appropriate: medications, allergies, medical history  Review of Systems:  No other skin or systemic complaints except as noted in HPI or Assessment and Plan.  Objective  Well appearing patient in no apparent distress; mood and affect are within normal limits. A focused examination was performed of the following areas: Relevant exam findings are noted in the Assessment and Plan.  Scalp (3) Erythematous thin papules/macules with gritty scale.   Right Forearm - Posterior Erythematous stuck-on, waxy papule or plaque  Left distal dorsum forearm Hyperkeratotic papule   Assessment & Plan   ACTINIC DAMAGE - chronic, secondary to cumulative UV radiation exposure/sun exposure over time - diffuse scaly erythematous macules with underlying dyspigmentation - Recommend daily broad spectrum sunscreen SPF 30+ to sun-exposed areas, reapply every 2 hours as needed.  - Recommend staying in the shade or wearing long sleeves, sun glasses (UVA+UVB protection) and wide brim hats (4-inch brim around the entire circumference of the hat). - Call for new or changing lesions.  SEBORRHEIC KERATOSIS - Stuck-on, waxy, tan-brown papules and/or plaques  - Benign-appearing - Discussed benign etiology and prognosis. - Observe - Call for any changes  AK (actinic keratosis) (3) Scalp  Destruction of lesion - Scalp Complexity: simple   Destruction method: cryotherapy   Informed consent: discussed and consent obtained   Timeout:  patient name, date of birth, surgical site, and procedure verified Lesion destroyed using liquid nitrogen: Yes   Region frozen until ice  ball extended beyond lesion: Yes   Outcome: patient tolerated procedure well with no complications   Post-procedure details: wound care instructions given    Inflamed seborrheic keratosis Right Forearm - Posterior  Symptomatic, irritating, patient would like treated.  Benign-appearing.  Call clinic for new or changing lesions.   Prior to procedure, discussed risks of blister formation, small wound, skin dyspigmentation, or rare scar following treatment. Recommend Vaseline ointment to treated areas while healing.   Destruction of lesion - Right Forearm - Posterior Complexity: simple   Destruction method: cryotherapy   Informed consent: discussed and consent obtained   Timeout:  patient name, date of birth, surgical site, and procedure verified Lesion destroyed using liquid nitrogen: Yes   Region frozen until ice ball extended beyond lesion: Yes   Outcome: patient tolerated procedure well with no complications   Post-procedure details: wound care instructions given    Neoplasm of uncertain behavior of skin Left distal dorsum forearm  Epidermal / dermal shaving  Lesion diameter (cm):  1.2 Informed consent: discussed and consent obtained   Timeout: patient name, date of birth, surgical site, and procedure verified   Procedure prep:  Patient was prepped and draped in usual sterile fashion Prep type:  Isopropyl alcohol Anesthesia: the lesion was anesthetized in a standard fashion   Anesthetic:  1% lidocaine w/ epinephrine 1-100,000 buffered w/ 8.4% NaHCO3 Instrument used: flexible razor blade   Hemostasis achieved with: pressure, aluminum chloride and electrodesiccation   Outcome: patient tolerated procedure well   Post-procedure details: sterile dressing applied and wound care instructions given   Dressing type: bandage and petrolatum    Destruction of lesion Complexity: extensive  Destruction method: electrodesiccation and curettage   Informed consent: discussed and consent  obtained   Timeout:  patient name, date of birth, surgical site, and procedure verified Procedure prep:  Patient was prepped and draped in usual sterile fashion Prep type:  Isopropyl alcohol Anesthesia: the lesion was anesthetized in a standard fashion   Anesthetic:  1% lidocaine w/ epinephrine 1-100,000 buffered w/ 8.4% NaHCO3 Curettage performed in three different directions: Yes   Electrodesiccation performed over the curetted area: Yes   Lesion length (cm):  1.2 Lesion width (cm):  1.2 Margin per side (cm):  0.2 Final wound size (cm):  1.6 Hemostasis achieved with:  pressure and aluminum chloride Outcome: patient tolerated procedure well with no complications   Post-procedure details: sterile dressing applied and wound care instructions given   Dressing type: bandage and petrolatum    Specimen 1 - Surgical pathology Differential Diagnosis: SCC vs other  Check Margins: No EDC today  Seborrheic keratosis  Actinic skin damage  SCC (squamous cell carcinoma), arm, left   Return in about 8 months (around 01/17/2024).  I, Nathaniel Ibarra, CMA, am acting as scribe for Nathaniel Sans, MD .  Documentation: I have reviewed the above documentation for accuracy and completeness, and I agree with the above.  Nathaniel Sans, MD

## 2023-05-18 ENCOUNTER — Telehealth: Payer: Self-pay

## 2023-05-18 NOTE — Telephone Encounter (Signed)
Transition Care Management Unsuccessful Follow-up Telephone Call  Date of discharge and from where:  [place] 5/19  Attempts:  2nd Attempt  Reason for unsuccessful TCM follow-up call:  Unable to leave message   Lenard Forth [facility] Guide, [facility] [phone] [address] [place], [place], [place] [address] Phone: [phone] Email: [email or web address]

## 2023-05-19 ENCOUNTER — Ambulatory Visit: Payer: PPO | Admitting: Internal Medicine

## 2023-05-22 ENCOUNTER — Ambulatory Visit (INDEPENDENT_AMBULATORY_CARE_PROVIDER_SITE_OTHER): Payer: PPO | Admitting: Internal Medicine

## 2023-05-22 VITALS — BP 104/60 | HR 88 | Ht 72.0 in | Wt 200.0 lb

## 2023-05-22 DIAGNOSIS — I2510 Atherosclerotic heart disease of native coronary artery without angina pectoris: Secondary | ICD-10-CM

## 2023-05-22 DIAGNOSIS — R197 Diarrhea, unspecified: Secondary | ICD-10-CM | POA: Diagnosis not present

## 2023-05-22 DIAGNOSIS — I10 Essential (primary) hypertension: Secondary | ICD-10-CM

## 2023-05-22 MED ORDER — CHOLESTYRAMINE 4 G PO PACK
1.0000 | PACK | Freq: Two times a day (BID) | ORAL | 1 refills | Status: DC
Start: 2023-05-22 — End: 2023-07-28

## 2023-05-22 NOTE — Progress Notes (Signed)
Established Patient Office Visit  Subjective:  Patient ID: Nathaniel Ibarra, male    DOB: 1941-06-04  Age: 82 y.o. MRN: [id number]  Chief Complaint  Patient presents with   Follow-up    1 Month Follow Up    No new complaints, here for lab review and medication refills. Still c/o diarrhea albeit less profuse and c/o nausea with imodium. Less SOB now.    No other concerns at this time.   Past Medical History:  Diagnosis Date   Aortic atherosclerosis (HCC)    Basal cell carcinoma 05/18/2022   Left inf cheek - EDC   Bladder cancer (HCC) 2008   BPH (benign prostatic hyperplasia)    Bradycardia    Cardiomyopathy (HCC)    a.) TTE 08/17/2015: EF 55%; b.) LHC 09/01/2015: EF 70%; c.) LHC 11/14/2017: EF >55%; d.) TTE 11/15/2017: EF 45%; e.) MPI 08/05/2019: EF 45%; f.) TTE 05/17/2020: EF 25-30%; g.) TTE 07/16/2020: EF >55%; h.) TTE 03/02/2022: EF >55%   Cataract, bilateral    Coronary artery disease    a.) questionable NSTEMI 2016; b.) LHC/PCI 09/01/2015: 80% mLAD (2.75 x 15 mm Xience Alpine DES), 50% m-dLCx, 50% dLCx; c.) LHC 11/14/2017: 30% oD1-D1, 40% pLCx, 40% mLAD -- med mgmt   Depression    GERD (gastroesophageal reflux disease)    HFrEF (heart failure with reduced ejection fraction) (HCC)    a.) TTE 08/17/2015: EF 55%, anteroseptal HK, triv PR/TR, G1DD; b.) TTE 11/15/2017: EF 45%, mild LAE, mild MR, mod RVE; c.) TTE 05/17/2020: EF 25-30%, glob HK, mod LV dil, mild LVH, mild BAE, mod MR, triv AR; d.) TTE 07/16/2020: EF >55%, mild LVH, triv MR, mild TR/PR; e.) TTE 03/02/2022: EF >55%, mild LVH, LAE, mild MR/TR/PR, G1DD   History of 2019 novel coronavirus disease (COVID-19)    a.) 12/2020; b.) 08/04/2022   HLD (hyperlipidemia)    Hypertension    Hypothyroidism    LBBB (left bundle branch block)    Long term current use of amiodarone    NSTEMI (non-ST elevated myocardial infarction) (HCC) 2016   Osteoarthritis    Paroxysmal atrial fibrillation (HCC)    a.) CHA2DS2VASc = 5 (age  x 2, HFrEF, HTN, previous MI);  b.) rate/rhythm maintained on oral amiodarone; no chronic anticoagulation (discontinued due to cost, medical noncompliance, falls)   Peripheral edema    Pneumonia due to COVID-19 virus 12/2020   Sleep difficulties    a.) uses trazodone PRN   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   L mid dorsum forearm - ED&C    Past Surgical History:  Procedure Laterality Date   CARDIAC CATHETERIZATION N/A 09/01/2015   Procedure: Left Heart Cath and Coronary Angiography;  Surgeon: Laurier Nancy, MD;  Location: [facility] INVASIVE CV LAB;  Service: Cardiovascular;  Laterality: N/A;   CARDIAC CATHETERIZATION N/A 09/01/2015   Procedure: Coronary Stent Intervention;  Surgeon: Alwyn Pea, MD;  Location: [facility] INVASIVE CV LAB;  Service: Cardiovascular;  Laterality: N/A;   CATARACT EXTRACTION W/PHACO Left 11/23/2016   Procedure: CATARACT EXTRACTION PHACO AND INTRAOCULAR LENS PLACEMENT (IOC);  Surgeon: Lockie Mola, MD;  Location: [facility];  Service: Ophthalmology;  Laterality: Left;   CATARACT EXTRACTION W/PHACO Right 02/01/2017   Procedure: CATARACT EXTRACTION PHACO AND INTRAOCULAR LENS PLACEMENT (IOC) Complicated  right toric lens;  Surgeon: Lockie Mola, MD;  Location: [facility];  Service: Ophthalmology;  Laterality: Right;  Malyugin toric Lens   KNEE ARTHROPLASTY Left 10/24/2022   Procedure: COMPUTER ASSISTED TOTAL KNEE ARTHROPLASTY;  Surgeon: Donato Heinz, MD;  Location: [facility] ORS;  Service: Orthopedics;  Laterality: Left;   LEFT HEART CATH AND CORONARY ANGIOGRAPHY Right 11/14/2017   Procedure: LEFT HEART CATH AND CORONARY ANGIOGRAPHY;  Surgeon: Laurier Nancy, MD;  Location: [facility] INVASIVE CV LAB;  Service: Cardiovascular;  Laterality: Right;   SCROTAL EXPLORATION Left 11/24/2020   Procedure: SCROTUM EXPLORATION WITH LEFT ORCHIECTOMY;  Surgeon: Noel Christmas, MD;  Location: [facility] ORS;  Service: Urology;  Laterality: Left;    TONSILLECTOMY      Social History   Socioeconomic History   Marital status: Married    Spouse name: [name]   Number of children: 2   Years of education: Not on file   Highest education level: Not on file  Occupational History   Not on file  Tobacco Use   Smoking status: Never   Smokeless tobacco: Never  Vaping Use   Vaping Use: Never used  Substance and Sexual Activity   Alcohol use: No   Drug use: No   Sexual activity: Not Currently  Other Topics Concern   Not on file  Social History Narrative   Not on file   Social Determinants of Health   Financial Resource Strain: Not on file  Food Insecurity: No Food Insecurity (04/07/2023)   Hunger Vital Sign    Worried About Running Out of Food in the Last Year: Never true    Ran Out of Food in the Last Year: Never true  Transportation Needs: No Transportation Needs (04/07/2023)   PRAPARE - Administrator, Civil Service (Medical): No    Lack of Transportation (Non-Medical): No  Physical Activity: Not on file  Stress: Not on file  Social Connections: Not on file  Intimate Partner Violence: Not At Risk (03/28/2023)   Humiliation, Afraid, Rape, and Kick questionnaire    Fear of Current or Ex-Partner: No    Emotionally Abused: No    Physically Abused: No    Sexually Abused: No    Family History  Problem Relation Age of Onset   Emphysema Mother    Emphysema Father     No Known Allergies  Review of Systems  Constitutional: Negative.  Negative for weight loss.  HENT: Negative.    Eyes: Negative.   Respiratory:  Positive for shortness of breath. Negative for cough.   Cardiovascular: Negative.   Gastrointestinal: Negative.   Genitourinary: Negative.   Skin: Negative.   Neurological: Negative.   Endo/Heme/Allergies: Negative.        Objective:   BP 104/60   Pulse 88   Ht 6' (1.829 m)   Wt 200 lb (90.7 kg)   SpO2 91%   BMI 27.12 kg/m   Vitals:   05/22/23 1341  BP: 104/60  Pulse: 88  Height: 6'  (1.829 m)  Weight: 200 lb (90.7 kg)  SpO2: 91%  BMI (Calculated): 27.12    Physical Exam Vitals reviewed.  Constitutional:      Appearance: Normal appearance.  HENT:     Head: Normocephalic.     Left Ear: There is no impacted cerumen.     Nose: Nose normal.     Mouth/Throat:     Mouth: Mucous membranes are moist.     Pharynx: No posterior oropharyngeal erythema.  Eyes:     Extraocular Movements: Extraocular movements intact.     Pupils: Pupils are equal, round, and reactive to light.  Cardiovascular:     Rate and Rhythm: Regular rhythm.     Chest  Wall: PMI is not displaced.     Pulses: Normal pulses.     Heart sounds: Normal heart sounds. No murmur heard. Pulmonary:     Effort: Pulmonary effort is normal.     Breath sounds: Normal air entry. No rhonchi or rales.  Abdominal:     General: Abdomen is flat and protuberant. Bowel sounds are normal. There is no distension.     Palpations: Abdomen is soft. There is no hepatomegaly, splenomegaly or mass.     Tenderness: There is no abdominal tenderness.  Musculoskeletal:        General: Normal range of motion.     Cervical back: Normal range of motion and neck supple.     Right lower leg: No edema.     Left lower leg: No edema.  Skin:    General: Skin is warm and dry.  Neurological:     General: No focal deficit present.     Mental Status: He is alert and oriented to person, place, and time.     Cranial Nerves: No cranial nerve deficit.     Motor: No weakness.  Psychiatric:        Mood and Affect: Mood normal.        Behavior: Behavior normal.      No results found for any visits on 05/22/23.      Assessment & Plan:   Problem List Items Addressed This Visit       Cardiovascular and Mediastinum   Coronary artery disease (Chronic)   Relevant Medications   cholestyramine (QUESTRAN) 4 g packet   Other Relevant Orders   Comprehensive metabolic panel   Lipid panel   CK   Essential hypertension (Chronic)    Relevant Medications   cholestyramine (QUESTRAN) 4 g packet   Other Relevant Orders   Comprehensive metabolic panel   Other Visit Diagnoses     Diarrhea of presumed infectious origin    -  Primary   Relevant Medications   cholestyramine (QUESTRAN) 4 g packet       Return in about 2 months (around 07/22/2023) for fu with labs prior.   Total time spent: 20 minutes  Luna Fuse, MD  05/22/2023   This document may have been prepared by [facility] Voice Recognition software and as such may include unintentional dictation errors.

## 2023-05-24 ENCOUNTER — Other Ambulatory Visit: Payer: Self-pay | Admitting: Internal Medicine

## 2023-05-24 ENCOUNTER — Encounter: Payer: Self-pay | Admitting: Dermatology

## 2023-05-24 ENCOUNTER — Telehealth: Payer: Self-pay

## 2023-05-24 DIAGNOSIS — E039 Hypothyroidism, unspecified: Secondary | ICD-10-CM

## 2023-05-24 NOTE — Telephone Encounter (Signed)
Left message with patients wife for patient to call for bx results/sh

## 2023-05-24 NOTE — Telephone Encounter (Signed)
-----   Message from Deirdre Evener, MD sent at 05/23/2023  8:34 PM EDT ----- Diagnosis Skin , left distal dorsum forearm WELL DIFFERENTIATED SQUAMOUS CELL CARCINOMA  Cancer = SCC Already treated Recheck next visit

## 2023-05-24 NOTE — Telephone Encounter (Signed)
-----   Message from David C Kowalski, MD sent at 05/23/2023  8:34 PM EDT ----- Diagnosis Skin , left distal dorsum forearm WELL DIFFERENTIATED SQUAMOUS CELL CARCINOMA  Cancer = SCC Already treated Recheck next visit 

## 2023-05-24 NOTE — Telephone Encounter (Signed)
Discussed biopsy results with pt  °

## 2023-05-30 ENCOUNTER — Encounter: Payer: Self-pay | Admitting: Gastroenterology

## 2023-05-30 ENCOUNTER — Ambulatory Visit (INDEPENDENT_AMBULATORY_CARE_PROVIDER_SITE_OTHER): Payer: PPO | Admitting: Gastroenterology

## 2023-05-30 ENCOUNTER — Ambulatory Visit: Payer: Self-pay

## 2023-05-30 VITALS — BP 172/70 | HR 66 | Temp 97.9°F | Ht 72.0 in | Wt 200.0 lb

## 2023-05-30 DIAGNOSIS — R194 Change in bowel habit: Secondary | ICD-10-CM

## 2023-05-30 NOTE — Progress Notes (Signed)
Gastroenterology Consultation  Referring Provider:     Sherron Monday, MD Primary Care Physician:  Sherron Monday, MD Primary Gastroenterologist:  Dr. Servando Snare     Reason for Consultation:     Diarrhea        HPI:   Nathaniel Ibarra is a 82 y.o. y/o male referred for consultation & management of diarrhea by Dr. Sherron Monday, MD. This patient comes to see me today after being seen last by me back in 2009.  The patient had pneumonia and after that he started to have diarrhea.  The patient reports his diarrhea to be 3 times a day and it is not diarrhea but soft stools.  He denies any unexplained weight loss.  He denies any black stools or bloody stools.  He also states that he does not wake up in the melanite to have a bowel movement.  The patient was started on Questran by his primary care provider and has noticed no difference.  He also reports that he has cereal every morning and does drink milk.  He also reports the softer stools are associated with a lot of bloating and gas.  The patient reports his last colonoscopy to be back in 2009 when he last saw me.  Past Medical History:  Diagnosis Date   Aortic atherosclerosis (HCC)    Basal cell carcinoma 05/18/2022   Left inf cheek - EDC   Bladder cancer (HCC) 2008   BPH (benign prostatic hyperplasia)    Bradycardia    Cardiomyopathy (HCC)    a.) TTE 08/17/2015: EF 55%; b.) LHC 09/01/2015: EF 70%; c.) LHC 11/14/2017: EF >55%; d.) TTE 11/15/2017: EF 45%; e.) MPI 08/05/2019: EF 45%; f.) TTE 05/17/2020: EF 25-30%; g.) TTE 07/16/2020: EF >55%; h.) TTE 03/02/2022: EF >55%   Cataract, bilateral    Coronary artery disease    a.) questionable NSTEMI 2016; b.) LHC/PCI 09/01/2015: 80% mLAD (2.75 x 15 mm Xience Alpine DES), 50% m-dLCx, 50% dLCx; c.) LHC 11/14/2017: 30% oD1-D1, 40% pLCx, 40% mLAD -- med mgmt   Depression    GERD (gastroesophageal reflux disease)    HFrEF (heart failure with reduced ejection fraction) (HCC)    a.) TTE  08/17/2015: EF 55%, anteroseptal HK, triv PR/TR, G1DD; b.) TTE 11/15/2017: EF 45%, mild LAE, mild MR, mod RVE; c.) TTE 05/17/2020: EF 25-30%, glob HK, mod LV dil, mild LVH, mild BAE, mod MR, triv AR; d.) TTE 07/16/2020: EF >55%, mild LVH, triv MR, mild TR/PR; e.) TTE 03/02/2022: EF >55%, mild LVH, LAE, mild MR/TR/PR, G1DD   History of 2019 novel coronavirus disease (COVID-19)    a.) 12/2020; b.) 08/04/2022   HLD (hyperlipidemia)    Hypertension    Hypothyroidism    LBBB (left bundle branch block)    Long term current use of amiodarone    NSTEMI (non-ST elevated myocardial infarction) (HCC) 2016   Osteoarthritis    Paroxysmal atrial fibrillation (HCC)    a.) CHA2DS2VASc = 5 (age x 2, HFrEF, HTN, previous MI);  b.) rate/rhythm maintained on oral amiodarone; no chronic anticoagulation (discontinued due to cost, medical noncompliance, falls)   Peripheral edema    Pneumonia due to COVID-19 virus 12/2020   Sleep difficulties    a.) uses trazodone PRN   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   L mid dorsum forearm - ED&C   Squamous cell carcinoma of skin 05/17/2023   Left distal dorsum forearm, EDC    Past Surgical History:  Procedure Laterality  Date   CARDIAC CATHETERIZATION N/A 09/01/2015   Procedure: Left Heart Cath and Coronary Angiography;  Surgeon: Laurier Nancy, MD;  Location: [facility];  Service: Cardiovascular;  Laterality: N/A;   CARDIAC CATHETERIZATION N/A 09/01/2015   Procedure: Coronary Stent Intervention;  Surgeon: Alwyn Pea, MD;  Location: [facility];  Service: Cardiovascular;  Laterality: N/A;   CATARACT EXTRACTION W/PHACO Left 11/23/2016   Procedure: CATARACT EXTRACTION PHACO AND INTRAOCULAR LENS PLACEMENT (IOC);  Surgeon: Lockie Mola, MD;  Location: [facility];  Service: Ophthalmology;  Laterality: Left;   CATARACT EXTRACTION W/PHACO Right 02/01/2017   Procedure: CATARACT EXTRACTION PHACO AND INTRAOCULAR LENS PLACEMENT (IOC)  Complicated  right toric lens;  Surgeon: Lockie Mola, MD;  Location: [facility];  Service: Ophthalmology;  Laterality: Right;  Malyugin toric Lens   KNEE ARTHROPLASTY Left 10/24/2022   Procedure: COMPUTER ASSISTED TOTAL KNEE ARTHROPLASTY;  Surgeon: Donato Heinz, MD;  Location: [facility];  Service: Orthopedics;  Laterality: Left;   LEFT HEART CATH AND CORONARY ANGIOGRAPHY Right 11/14/2017   Procedure: LEFT HEART CATH AND CORONARY ANGIOGRAPHY;  Surgeon: Laurier Nancy, MD;  Location: [facility];  Service: Cardiovascular;  Laterality: Right;   SCROTAL EXPLORATION Left 11/24/2020   Procedure: SCROTUM EXPLORATION WITH LEFT ORCHIECTOMY;  Surgeon: Noel Christmas, MD;  Location: [facility];  Service: Urology;  Laterality: Left;   TONSILLECTOMY      Prior to Admission medications   Medication Sig Start Date End Date Taking? Authorizing Provider  acetaminophen (TYLENOL) 325 MG tablet Take 2 tablets (650 mg total) by mouth every 6 (six) hours as needed for mild pain or headache (fever >/= 101). 01/13/21   Lonia Blood, MD  albuterol (VENTOLIN HFA) 108 (90 Base) MCG/ACT inhaler Inhale 1 puff into the lungs every 4 (four) hours as needed. 10/27/21   [provider]  amiodarone (PACERONE) 200 MG tablet Take 1 tablet (200 mg total) by mouth daily. 05/18/20   Alford Highland, MD  cholestyramine Lanetta Inch) 4 g packet Take 1 packet by mouth 2 (two) times daily. 05/22/23 05/21/24  Sherron Monday, MD  famotidine (PEPCID) 20 MG tablet Take 1 tablet (20 mg total) by mouth 2 (two) times daily for 15 days. 05/07/23 05/22/23  Sharman Cheek, MD  finasteride (PROSCAR) 5 MG tablet Take 1 tablet (5 mg total) by mouth daily. 05/18/20   Alford Highland, MD  ipratropium (ATROVENT) 0.06 % nasal spray Place 2 sprays into the nose 3 (three) times daily as needed. 03/26/23 03/25/24  [provider]  levothyroxine (SYNTHROID) 100 MCG tablet TAKE 1 TABLET BY MOUTH EVERY DAY IN THE  MORNING 05/24/23   Sherron Monday, MD  naproxen (NAPROSYN) 500 MG tablet Take 500 mg by mouth 2 (two) times daily as needed for mild pain. 02/22/23   [provider]  ondansetron (ZOFRAN-ODT) 4 MG disintegrating tablet Take 1 tablet (4 mg total) by mouth every 8 (eight) hours as needed for nausea or vomiting. 05/07/23   Sharman Cheek, MD  PARoxetine (PAXIL) 20 MG tablet Take 20 mg by mouth every morning. Patient not taking: Reported on 04/17/2023 04/04/23   [provider]  promethazine-dextromethorphan (PROMETHAZINE-DM) 6.25-15 MG/5ML syrup Take 5 mLs by mouth every 6 (six) hours as needed for cough. Patient not taking: Reported on 04/17/2023 03/26/23   [provider]  spironolactone (ALDACTONE) 25 MG tablet Take 0.5 tablets (12.5 mg total) by mouth daily. 05/19/20   Alford Highland, MD  tamsulosin (  FLOMAX) 0.4 MG CAPS capsule Take 0.8 mg by mouth at bedtime.     [provider]    Family History  Problem Relation Age of Onset   Emphysema Mother    Emphysema Father      Social History   Tobacco Use   Smoking status: Never   Smokeless tobacco: Never  Vaping Use   Vaping Use: Never used  Substance Use Topics   Alcohol use: No   Drug use: No    Allergies as of 05/30/2023   (No Known Allergies)    Review of Systems:    All systems reviewed and negative except where noted in HPI.   Physical Exam:  There were no vitals taken for this visit. No LMP for male patient. General:   Alert,  Well-developed, well-nourished, pleasant and cooperative in NAD Head:  Normocephalic and atraumatic. Eyes:  Sclera clear, no icterus.   Conjunctiva pink. Ears:  Normal auditory acuity. Neurologic:  Alert and oriented x3;  grossly normal neurologically. Skin:  Intact without significant lesions or rashes.  No jaundice. Lymph Nodes:  No significant cervical adenopathy. Psych:  Alert and cooperative. Normal mood and affect.  Imaging Studies: CT ABDOMEN PELVIS W  CONTRAST  Result Date: 05/07/2023 CLINICAL DATA:  Diarrhea for 3 weeks, vomiting EXAM: CT ABDOMEN AND PELVIS WITH CONTRAST TECHNIQUE: Multidetector CT imaging of the abdomen and pelvis was performed using the standard protocol following bolus administration of intravenous contrast. RADIATION DOSE REDUCTION: This exam was performed according to the departmental dose-optimization program which includes automated exposure control, adjustment of the mA and/or kV according to patient size and/or use of iterative reconstruction technique. CONTRAST:  OMNIPAQUE IOHEXOL 300 MG/ML  SOLN COMPARISON:  05/31/2022 FINDINGS: Lower chest: Stable scarring at the lung bases, right greater than left. Hepatobiliary: No focal liver abnormality is seen. No gallstones, gallbladder wall thickening, or biliary dilatation. Pancreas: Unremarkable. No pancreatic ductal dilatation or surrounding inflammatory changes. Spleen: Normal in size without focal abnormality. Adrenals/Urinary Tract: Adrenal glands are unremarkable. Kidneys are normal, without renal calculi, focal lesion, or hydronephrosis. Small bladder diverticula are noted. Otherwise bladder is unremarkable. Stomach/Bowel: No bowel obstruction or ileus. Normal appendix right lower quadrant. Diffuse colonic diverticulosis without diverticulitis. No bowel wall thickening or inflammatory change. Colonic interposition again noted right upper quadrant. Vascular/Lymphatic: Aortic atherosclerosis. No enlarged abdominal or pelvic lymph nodes. Reproductive: Stable enlargement of the prostate. Other: No free fluid or free intraperitoneal gas. No abdominal wall hernia. Musculoskeletal: No acute or destructive bony lesions. Reconstructed images demonstrate no additional findings. IMPRESSION: 1. No acute intra-abdominal or intrapelvic process. 2. Diffuse colonic diverticulosis without diverticulitis. 3. Stable enlarged prostate. 4.  Aortic Atherosclerosis (ICD10-I70.0). Electronically  Signed   By: Sharlet Salina M.D.   On: 05/07/2023 17:30    Assessment and Plan:   Nathaniel Ibarra is a 82 y.o. y/o male who comes in today with his wife being on the phone during the entire visit.  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  The patient has also been told that it may be adversely affected by the dairy intake.  The patient was told to avoid dairy products for 1 week and see if that helps his symptoms.  He has been told to stop the Questran.  He will also be put on Citrucel to see if this could bulk up his stools somewhat to make him have less soft stools.  The  patient has been explained that the bloating and gas may be a result of the dairy products.  The patient will call me in 1 week if his symptoms do not improve whereupon he may need to be set up for colonoscopy.  The patient has been explained the plan and agrees with it.    Midge Minium, MD. Clementeen Graham    Note: This dictation was prepared with Dragon dictation along with smaller phrase technology. Any transcriptional errors that result from this process are unintentional.

## 2023-05-30 NOTE — Patient Outreach (Signed)
  Care Coordination   05/30/2023 Name: Nathaniel Ibarra MRN: [id number] DOB: 20-Aug-1941   Care Coordination Outreach Attempts:  An unsuccessful telephone outreach was attempted for a scheduled appointment today. Contact answering states patient is not at home. Request call back at another time. HIPAA compliant message left.   Follow Up Plan:  Additional outreach attempts will be made to offer the patient care coordination information and services.   Encounter Outcome:  No Answer   Care Coordination Interventions:  No, not indicated    George Ina [facility] Care Coordination [phone] direct line

## 2023-06-12 ENCOUNTER — Telehealth: Payer: Self-pay | Admitting: Gastroenterology

## 2023-06-12 NOTE — Telephone Encounter (Signed)
Pt reports no improvement with diarrhea and now has started experiencing intermittent nausea and vomited this past Friday... Pt does not recall starting fiber supplement... Please advise if okay to just schedule colonoscopy per your OV note or will addit labs be need first..Marland Kitchen

## 2023-06-12 NOTE — Telephone Encounter (Signed)
Patient called in because he was not feeling well. Patient stated the Servando Snare advised him that if was not feeling come back for a follow up appointment with him. Patient stated to call him or his wife.

## 2023-06-13 ENCOUNTER — Other Ambulatory Visit: Payer: Self-pay

## 2023-06-13 DIAGNOSIS — I5032 Chronic diastolic (congestive) heart failure: Secondary | ICD-10-CM | POA: Diagnosis not present

## 2023-06-13 DIAGNOSIS — R194 Change in bowel habit: Secondary | ICD-10-CM

## 2023-06-13 DIAGNOSIS — R531 Weakness: Secondary | ICD-10-CM | POA: Diagnosis not present

## 2023-06-13 DIAGNOSIS — U071 COVID-19: Secondary | ICD-10-CM | POA: Diagnosis not present

## 2023-06-13 DIAGNOSIS — R062 Wheezing: Secondary | ICD-10-CM | POA: Diagnosis not present

## 2023-06-13 DIAGNOSIS — G4733 Obstructive sleep apnea (adult) (pediatric): Secondary | ICD-10-CM | POA: Diagnosis not present

## 2023-06-14 ENCOUNTER — Telehealth: Payer: Self-pay

## 2023-06-14 ENCOUNTER — Ambulatory Visit: Payer: Self-pay

## 2023-06-14 ENCOUNTER — Other Ambulatory Visit: Payer: Self-pay

## 2023-06-14 MED ORDER — NA SULFATE-K SULFATE-MG SULF 17.5-3.13-1.6 GM/177ML PO SOLN: 1.0000 | Freq: Once | ORAL | 0 refills | Status: AC

## 2023-06-14 NOTE — Patient Outreach (Signed)
  Care Coordination   Follow Up Visit Note   06/14/2023 Name: KORBAN SHEARER MRN: [id number] DOB: 19-Sep-1941  KOLT MCWHIRTER is a 82 y.o. year old male who sees Sherron Monday, MD for primary care. I spoke with  Georgeanna Harrison and spouse [name] McPhereson by phone today.  What matters to the patients health and wellness today?  Patient states he is doing a little better since abstaining from dairy products and taking Citrucel.  Patient states bloating and gas as dissipated. He states he continues to have some diarrhea but not as bad as it was. He reports being scheduled for colonoscopy on 06/27/13.  Patient reports today's weight is 195 lbs.  He states he has not been consistent with checking his weight daily.  Patient reports O2 sats range from 88-94.  Patient states he uses his oxygen if O2 sats go <90.    Goals Addressed             This Visit's Progress    Continued improvement post hospitalization for pneumonia and management of health conditions       Interventions Today    Flowsheet Row Most Recent Value  Chronic Disease   Chronic disease during today's visit Congestive Heart Failure (CHF), Other  [diarrhea]  General Interventions   General Interventions Discussed/Reviewed General Interventions Reviewed, Doctor Visits  [evaluation of current treatment plan related to health conditions and patients adherence to plan as established by provider. Assessed for HF and diarrhea symptoms, O2 sat and weight.]  Doctor Visits Discussed/Reviewed Doctor Visits Reviewed  [reviewed upcoming provider visits.  Advised to call cardiology office to confirm clearance visit.]  Education Interventions   Education Provided Provided Education  [Advised to weigh daily and record. Reminded to notify provider for 3 lb weight gain over night or 5 lbs in a week. Notify provider of HF symptoms or worsening diarrhea. Advised to wear oxygen if O2 sat <90]  Nutrition Interventions   Nutrition  Discussed/Reviewed Nutrition Reviewed  [reinforced advisement by gastroenterologist to avoid dairy products.]  Pharmacy Interventions   Pharmacy Dicussed/Reviewed Pharmacy Topics Reviewed  [medications reviewed and compliance discussed.]              SDOH assessments and interventions completed:  No     Care Coordination Interventions:  Yes, provided   Follow up plan: Follow up call scheduled for 07/26/23    Encounter Outcome:  Pt. Visit Completed   George Ina RN,BSN,CCM [facility] Care Coordination [phone] direct line

## 2023-06-14 NOTE — Patient Instructions (Signed)
Visit Information  Thank you for taking time to visit with me today. Please don't hesitate to contact me if I can be of assistance to you.   Following are the goals we discussed today:   Goals Addressed             This Visit's Progress    Continued improvement post hospitalization for pneumonia and management of health conditions       Interventions Today    Flowsheet Row Most Recent Value  Chronic Disease   Chronic disease during today's visit Congestive Heart Failure (CHF), Other  [diarrhea]  General Interventions   General Interventions Discussed/Reviewed General Interventions Reviewed, Doctor Visits  [evaluation of current treatment plan related to health conditions and patients adherence to plan as established by provider. Assessed for HF and diarrhea symptoms, O2 sat and weight.]  Doctor Visits Discussed/Reviewed Doctor Visits Reviewed  [reviewed upcoming provider visits.  Advised to call cardiology office to confirm clearance visit.]  Education Interventions   Education Provided Provided Education  [Advised to weigh daily and record. Reminded to notify provider for 3 lb weight gain over night or 5 lbs in a week. Notify provider of HF symptoms or worsening diarrhea. Advised to wear oxygen if O2 sat <90]  Nutrition Interventions   Nutrition Discussed/Reviewed Nutrition Reviewed  [reinforced advisement by gastroenterologist to avoid dairy products.]  Pharmacy Interventions   Pharmacy Dicussed/Reviewed Pharmacy Topics Reviewed  [medications reviewed and compliance discussed.]              Our next appointment is by telephone on 07/26/23 at 9:30 am  Please call the care guide team at [phone] if you need to cancel or reschedule your appointment.   If you are experiencing a Mental Health or Behavioral Health Crisis or need someone to talk to, please call the Suicide and Crisis Lifeline: 988 call 1-800-273-TALK (toll free, 24 hour hotline)  Patient verbalizes  understanding of instructions and care plan provided today and agrees to view in MyChart. Active MyChart status and patient understanding of how to access instructions and care plan via MyChart confirmed with patient.     George Ina RN,BSN,CCM [facility] Care Coordination [phone] direct line

## 2023-06-14 NOTE — Telephone Encounter (Signed)
Clearance faxed x 2 to Dr Paraschos-cardiology

## 2023-06-15 NOTE — Telephone Encounter (Signed)
Received clearance and pt has been cleared  Letter sent to be scanned

## 2023-06-16 ENCOUNTER — Other Ambulatory Visit: Payer: Self-pay

## 2023-06-23 ENCOUNTER — Telehealth: Payer: Self-pay | Admitting: Gastroenterology

## 2023-06-23 NOTE — Telephone Encounter (Signed)
Patient called in and left a voicemail stating that he wants to cancel his procedure. He wanted [place] to call back to let him know we got his message. I called him back, but his mailbox was full.

## 2023-06-27 DIAGNOSIS — Z79899 Other long term (current) drug therapy: Secondary | ICD-10-CM | POA: Diagnosis not present

## 2023-06-27 DIAGNOSIS — I5032 Chronic diastolic (congestive) heart failure: Secondary | ICD-10-CM | POA: Diagnosis not present

## 2023-06-27 DIAGNOSIS — I10 Essential (primary) hypertension: Secondary | ICD-10-CM | POA: Diagnosis not present

## 2023-06-27 DIAGNOSIS — I447 Left bundle-branch block, unspecified: Secondary | ICD-10-CM | POA: Diagnosis not present

## 2023-06-27 DIAGNOSIS — I480 Paroxysmal atrial fibrillation: Secondary | ICD-10-CM | POA: Diagnosis not present

## 2023-06-28 ENCOUNTER — Ambulatory Visit: Admission: RE | Admit: 2023-06-28 | Payer: PPO | Source: Home / Self Care | Admitting: Gastroenterology

## 2023-06-28 ENCOUNTER — Encounter: Admission: RE | Payer: Self-pay | Source: Home / Self Care

## 2023-06-28 SURGERY — COLONOSCOPY WITH PROPOFOL
Anesthesia: General

## 2023-07-02 DIAGNOSIS — J209 Acute bronchitis, unspecified: Secondary | ICD-10-CM | POA: Diagnosis not present

## 2023-07-02 DIAGNOSIS — J019 Acute sinusitis, unspecified: Secondary | ICD-10-CM | POA: Diagnosis not present

## 2023-07-02 DIAGNOSIS — B9689 Other specified bacterial agents as the cause of diseases classified elsewhere: Secondary | ICD-10-CM | POA: Diagnosis not present

## 2023-07-13 DIAGNOSIS — I5032 Chronic diastolic (congestive) heart failure: Secondary | ICD-10-CM | POA: Diagnosis not present

## 2023-07-13 DIAGNOSIS — R062 Wheezing: Secondary | ICD-10-CM | POA: Diagnosis not present

## 2023-07-13 DIAGNOSIS — G4733 Obstructive sleep apnea (adult) (pediatric): Secondary | ICD-10-CM | POA: Diagnosis not present

## 2023-07-13 DIAGNOSIS — U071 COVID-19: Secondary | ICD-10-CM | POA: Diagnosis not present

## 2023-07-13 DIAGNOSIS — R531 Weakness: Secondary | ICD-10-CM | POA: Diagnosis not present

## 2023-07-21 ENCOUNTER — Ambulatory Visit: Payer: PPO | Admitting: Internal Medicine

## 2023-07-26 ENCOUNTER — Other Ambulatory Visit: Payer: Self-pay | Admitting: Internal Medicine

## 2023-07-26 ENCOUNTER — Other Ambulatory Visit: Payer: PPO

## 2023-07-26 ENCOUNTER — Ambulatory Visit: Payer: Self-pay

## 2023-07-26 DIAGNOSIS — R7303 Prediabetes: Secondary | ICD-10-CM | POA: Diagnosis not present

## 2023-07-26 DIAGNOSIS — I2510 Atherosclerotic heart disease of native coronary artery without angina pectoris: Secondary | ICD-10-CM | POA: Diagnosis not present

## 2023-07-26 DIAGNOSIS — E039 Hypothyroidism, unspecified: Secondary | ICD-10-CM | POA: Diagnosis not present

## 2023-07-26 NOTE — Patient Outreach (Signed)
  Care Coordination   07/26/2023 Name: Nathaniel Ibarra MRN: [id number] DOB: July 25, 1941   Care Coordination Outreach Attempts:  An unsuccessful telephone outreach was attempted for a scheduled appointment today.  HIPAA compliant message left with wife.   Follow Up Plan:  Additional outreach attempts will be made to offer the patient care coordination information and services.   Encounter Outcome:  Pt. Request to Call Back   Care Coordination Interventions:  No, not indicated    George Ina [facility] Care Coordination [phone] direct line

## 2023-07-28 ENCOUNTER — Ambulatory Visit (INDEPENDENT_AMBULATORY_CARE_PROVIDER_SITE_OTHER): Payer: PPO | Admitting: Internal Medicine

## 2023-07-28 VITALS — BP 110/70 | HR 62 | Ht 72.0 in | Wt 199.6 lb

## 2023-07-28 DIAGNOSIS — I2510 Atherosclerotic heart disease of native coronary artery without angina pectoris: Secondary | ICD-10-CM

## 2023-07-28 DIAGNOSIS — I10 Essential (primary) hypertension: Secondary | ICD-10-CM | POA: Diagnosis not present

## 2023-07-28 DIAGNOSIS — F3289 Other specified depressive episodes: Secondary | ICD-10-CM | POA: Diagnosis not present

## 2023-07-28 DIAGNOSIS — E7800 Pure hypercholesterolemia, unspecified: Secondary | ICD-10-CM | POA: Diagnosis not present

## 2023-07-28 MED ORDER — ROSUVASTATIN CALCIUM 10 MG PO TABS
10.0000 mg | ORAL_TABLET | Freq: Every day | ORAL | 11 refills | Status: DC
Start: 2023-07-28 — End: 2024-03-24

## 2023-07-28 MED ORDER — PAROXETINE HCL 20 MG PO TABS
20.0000 mg | ORAL_TABLET | Freq: Every morning | ORAL | 0 refills | Status: DC
Start: 2023-07-28 — End: 2023-11-07

## 2023-07-28 NOTE — Progress Notes (Signed)
Established Patient Office Visit  Subjective:  Patient ID: Nathaniel Ibarra, male    DOB: 02-25-1941  Age: 82 y.o. MRN: [id number]  Chief Complaint  Patient presents with   Follow-up    2 mo f/u with lab results    No new complaints, here for lab review and medication refills. TC well controlled on lab review but LDL is elevated. Triglycerides also satisfactory. Denies CP or SOB.     No other concerns at this time.   Past Medical History:  Diagnosis Date   Aortic atherosclerosis (HCC)    Basal cell carcinoma 05/18/2022   Left inf cheek - EDC   Bladder cancer (HCC) 2008   BPH (benign prostatic hyperplasia)    Bradycardia    Cardiomyopathy (HCC)    a.) TTE 08/17/2015: EF 55%; b.) LHC 09/01/2015: EF 70%; c.) LHC 11/14/2017: EF >55%; d.) TTE 11/15/2017: EF 45%; e.) MPI 08/05/2019: EF 45%; f.) TTE 05/17/2020: EF 25-30%; g.) TTE 07/16/2020: EF >55%; h.) TTE 03/02/2022: EF >55%   Cataract, bilateral    Coronary artery disease    a.) questionable NSTEMI 2016; b.) LHC/PCI 09/01/2015: 80% mLAD (2.75 x 15 mm Xience Alpine DES), 50% m-dLCx, 50% dLCx; c.) LHC 11/14/2017: 30% oD1-D1, 40% pLCx, 40% mLAD -- med mgmt   Depression    GERD (gastroesophageal reflux disease)    HFrEF (heart failure with reduced ejection fraction) (HCC)    a.) TTE 08/17/2015: EF 55%, anteroseptal HK, triv PR/TR, G1DD; b.) TTE 11/15/2017: EF 45%, mild LAE, mild MR, mod RVE; c.) TTE 05/17/2020: EF 25-30%, glob HK, mod LV dil, mild LVH, mild BAE, mod MR, triv AR; d.) TTE 07/16/2020: EF >55%, mild LVH, triv MR, mild TR/PR; e.) TTE 03/02/2022: EF >55%, mild LVH, LAE, mild MR/TR/PR, G1DD   History of 2019 novel coronavirus disease (COVID-19)    a.) 12/2020; b.) 08/04/2022   HLD (hyperlipidemia)    Hypertension    Hypothyroidism    LBBB (left bundle branch block)    Long term current use of amiodarone    NSTEMI (non-ST elevated myocardial infarction) (HCC) 2016   Osteoarthritis    Paroxysmal atrial fibrillation  (HCC)    a.) CHA2DS2VASc = 5 (age x 2, HFrEF, HTN, previous MI);  b.) rate/rhythm maintained on oral amiodarone; no chronic anticoagulation (discontinued due to cost, medical noncompliance, falls)   Peripheral edema    Pneumonia due to COVID-19 virus 12/2020   Sleep difficulties    a.) uses trazodone PRN   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   L mid dorsum forearm - ED&C   Squamous cell carcinoma of skin 05/17/2023   Left distal dorsum forearm, EDC    Past Surgical History:  Procedure Laterality Date   CARDIAC CATHETERIZATION N/A 09/01/2015   Procedure: Left Heart Cath and Coronary Angiography;  Surgeon: Laurier Nancy, MD;  Location: [facility];  Service: Cardiovascular;  Laterality: N/A;   CARDIAC CATHETERIZATION N/A 09/01/2015   Procedure: Coronary Stent Intervention;  Surgeon: Alwyn Pea, MD;  Location: [facility];  Service: Cardiovascular;  Laterality: N/A;   CATARACT EXTRACTION W/PHACO Left 11/23/2016   Procedure: CATARACT EXTRACTION PHACO AND INTRAOCULAR LENS PLACEMENT (IOC);  Surgeon: Lockie Mola, MD;  Location: [facility];  Service: Ophthalmology;  Laterality: Left;   CATARACT EXTRACTION W/PHACO Right 02/01/2017   Procedure: CATARACT EXTRACTION PHACO AND INTRAOCULAR LENS PLACEMENT (IOC) Complicated  right toric lens;  Surgeon: Lockie Mola, MD;  Location: [facility];  Service: Ophthalmology;  Laterality: Right;  Malyugin toric Lens   KNEE ARTHROPLASTY Left 10/24/2022   Procedure: COMPUTER ASSISTED TOTAL KNEE ARTHROPLASTY;  Surgeon: Donato Heinz, MD;  Location: [facility] OR[facility];  Service: Orthopedics;  Laterality: Left;   LEFT HEART CATH AND CORONARY ANGIOGRAPHY Right 11/14/2017   Procedure: LEFT HEART CATH AND CORONARY ANGIOGRAPHY;  Surgeon: Laurier Nancy, MD;  Location: [facility];  Service: Cardiovascular;  Laterality: Right;   SCROTAL EXPLORATION Left 11/24/2020   Procedure: SCROTUM EXPLORATION WITH LEFT  ORCHIECTOMY;  Surgeon: Noel Christmas, MD;  Location: [facility];  Service: Urology;  Laterality: Left;   TONSILLECTOMY      Social History   Socioeconomic History   Marital status: Married    Spouse name: [name]   Number of children: 2   Years of education: Not on file   Highest education level: Not on file  Occupational History   Not on file  Tobacco Use   Smoking status: Never   Smokeless tobacco: Never  Vaping Use   Vaping status: Never Used  Substance and Sexual Activity   Alcohol use: No   Drug use: No   Sexual activity: Not Currently  Other Topics Concern   Not on file  Social History Narrative   Not on file   Social Determinants of Health   Financial Resource Strain: Not on file  Food Insecurity: No Food Insecurity (04/07/2023)   Hunger Vital Sign    Worried About Running Out of Food in the Last Year: Never true    Ran Out of Food in the Last Year: Never true  Transportation Needs: No Transportation Needs (04/07/2023)   PRAPARE - Administrator, Civil Service (Medical): No    Lack of Transportation (Non-Medical): No  Physical Activity: Not on file  Stress: Not on file  Social Connections: Not on file  Intimate Partner Violence: Not At Risk (03/28/2023)   Humiliation, Afraid, Rape, and Kick questionnaire    Fear of Current or Ex-Partner: No    Emotionally Abused: No    Physically Abused: No    Sexually Abused: No    Family History  Problem Relation Age of Onset   Emphysema Mother    Emphysema Father     No Known Allergies  Review of Systems  Constitutional: Negative.  Negative for weight loss.  HENT: Negative.    Eyes: Negative.   Respiratory:  Positive for shortness of breath. Negative for cough.   Cardiovascular: Negative.   Gastrointestinal: Negative.   Genitourinary: Negative.   Skin: Negative.   Neurological: Negative.   Endo/Heme/Allergies: Negative.        Objective:   BP 110/70   Pulse 62   Ht 6' (1.829 m)   Wt 199  lb 9.6 oz (90.5 kg)   SpO2 93%   BMI 27.07 kg/m   Vitals:   07/28/23 1503  BP: 110/70  Pulse: 62  Height: 6' (1.829 m)  Weight: 199 lb 9.6 oz (90.5 kg)  SpO2: 93%  BMI (Calculated): 27.06    Physical Exam Vitals reviewed.  Constitutional:      Appearance: Normal appearance.  HENT:     Head: Normocephalic.     Left Ear: There is no impacted cerumen.     Nose: Nose normal.     Mouth/Throat:     Mouth: Mucous membranes are moist.     Pharynx: No posterior oropharyngeal erythema.  Eyes:     Extraocular Movements: Extraocular movements intact. **Note De-Identified via Obfucation** Pupil: Pupil are equal, round, and reactive to light.  Cardiovacular:     Rate and Rhythm: Regular rhythm.     Chet Wall: PMI i not diplaced.     Pule: Normal pule.     Heart ound: Normal heart ound. No murmur heard. Pulmonary:     Effort: Pulmonary effort i normal.     Breath ound: Normal air entry. No rhonchi or rale.  Abdominal:     General: Abdomen i flat and protuberant. Bowel ound are normal. There i no ditenion.     Palpation: Abdomen i oft. There i no hepatomegaly, plenomegaly or ma.     Tenderne: There i no abdominal tenderne.  Muculokeletal:        General: Normal range of motion.     Cervical back: Normal range of motion and neck upple.     Right lower leg: No edema.     Left lower leg: No edema.  Skin:    General: Skin i warm and dry.  Neurological:     General: No focal deficit preent.     Mental Statu: He i alert and oriented to peron, place, and time.     Cranial Nerve: No cranial nerve deficit.     Motor: No weakne.  Pychiatric:        Mood and Affect: Mood normal.        Behavior: Behavior normal.      No reult found for any viit on 07/28/23.  Recent Reult (from the pat 2160 hour())  Lipae, blood     Statu: None   Collection Time: 05/07/23  1:24 PM  Reult Value Ref Range   Lipae 23 11 - 51 U/L    Comment: Performed at [facility], [address] [place], [place] [address]  Comprehenive metabolic panel     Statu: Abnormal   Collection Time: 05/07/23  1:24 PM  Reult Value Ref Range   Sodium 137 135 - 145 mmol/L   Potaium 4.2 3.5 - 5.1 mmol/L   Chloride 103 98 - 111 mmol/L   CO2 27 22 - 32 mmol/L   Glucoe, Bld 85 70 - 99 mg/dL    Comment: Glucoe reference range applie only to ample taken after fating for at leat 8 hour.   BUN 26 (H) 8 - 23 mg/dL   Creatinine, Ser 5.95 0.61 - 1.24 mg/dL   Calcium 8.1 (L) 8.9 - 10.3 mg/dL   Total Protein 6.3 (L) 6.5 - 8.1 g/dL   Albumin 3.7 3.5 - 5.0 g/dL   AST 20 15 - 41 U/L   ALT 16 0 - 44 U/L   Alkaline Phophatae 81 38 - 126 U/L   Total Bilirubin 0.7 0.3 - 1.2 mg/dL   GFR, Etimated >63 >87 mL/min    Comment: (NOTE) Calculated uing the CKD-EPI Creatinine Equation (2021)    Anion gap 7 5 - 15    Comment: Performed at [facility], [address] [place], [place] [address]  CBC     Statu: Abnormal   Collection Time: 05/07/23  1:24 PM  Reult Value Ref Range   WBC 11.0 (H) 4.0 - 10.5 K/uL   RBC 4.38 4.22 - 5.81 MIL/uL   Hemoglobin 13.0 13.0 - 17.0 g/dL   HCT 29.5 18.8 - 41.6 %   MCV 94.7 80.0 - 100.0 fL   MCH 29.7 26.0 - 34.0 pg   MCHC 31.3 30.0 - 36.0 g/dL   RDW 60.6 30.1 - 60.1 %  Platelets 341 150 - 400 K/uL   nRBC 0.0 0.0 - 0.2 %    Comment: Performed at [facility], [address] [place], [place] [address]      Assessment & Plan:  As per problem list  Problem List Items Addressed This Visit       Cardiovascular and Mediastinum   Coronary artery disease - Primary (Chronic)   Relevant Medications   rosuvastatin (CRESTOR) 10 MG tablet     Other   Depression   Relevant Medications   PARoxetine (PAXIL) 20 MG tablet    Return in about 3 months (around 10/28/2023) for awv with labs prior.   Total time spent: 30 minutes  Luna Fuse, MD  07/28/2023   This document may have been prepared by  [facility] Voice Recognition software and as such may include unintentional dictation errors.

## 2023-08-08 DIAGNOSIS — R058 Other specified cough: Secondary | ICD-10-CM | POA: Diagnosis not present

## 2023-08-08 DIAGNOSIS — J984 Other disorders of lung: Secondary | ICD-10-CM | POA: Diagnosis not present

## 2023-08-08 DIAGNOSIS — R062 Wheezing: Secondary | ICD-10-CM | POA: Diagnosis not present

## 2023-08-08 DIAGNOSIS — R0989 Other specified symptoms and signs involving the circulatory and respiratory systems: Secondary | ICD-10-CM | POA: Diagnosis not present

## 2023-08-08 DIAGNOSIS — Z03818 Encounter for observation for suspected exposure to other biological agents ruled out: Secondary | ICD-10-CM | POA: Diagnosis not present

## 2023-08-08 DIAGNOSIS — J40 Bronchitis, not specified as acute or chronic: Secondary | ICD-10-CM | POA: Diagnosis not present

## 2023-08-10 ENCOUNTER — Telehealth: Payer: Self-pay

## 2023-08-10 NOTE — Telephone Encounter (Signed)
Pt called leaving a voicemail requesting for PCP to send of referral to pulmonology due pt being seen at [facility] clinic and having a x ray done suggesting pt needed a referral. Pt was contacted and informed that an appt would need to be scheduled but wife declined.

## 2023-08-13 DIAGNOSIS — R531 Weakness: Secondary | ICD-10-CM | POA: Diagnosis not present

## 2023-08-13 DIAGNOSIS — R062 Wheezing: Secondary | ICD-10-CM | POA: Diagnosis not present

## 2023-08-13 DIAGNOSIS — I5032 Chronic diastolic (congestive) heart failure: Secondary | ICD-10-CM | POA: Diagnosis not present

## 2023-08-13 DIAGNOSIS — U071 COVID-19: Secondary | ICD-10-CM | POA: Diagnosis not present

## 2023-08-13 DIAGNOSIS — G4733 Obstructive sleep apnea (adult) (pediatric): Secondary | ICD-10-CM | POA: Diagnosis not present

## 2023-08-15 ENCOUNTER — Telehealth: Payer: Self-pay | Admitting: Internal Medicine

## 2023-08-15 NOTE — Telephone Encounter (Signed)
Entered in error

## 2023-08-16 ENCOUNTER — Ambulatory Visit: Payer: PPO | Admitting: Internal Medicine

## 2023-08-18 ENCOUNTER — Ambulatory Visit (INDEPENDENT_AMBULATORY_CARE_PROVIDER_SITE_OTHER): Payer: PPO | Admitting: Internal Medicine

## 2023-08-18 VITALS — BP 130/60 | HR 63 | Ht 72.0 in | Wt 198.0 lb

## 2023-08-18 DIAGNOSIS — R911 Solitary pulmonary nodule: Secondary | ICD-10-CM

## 2023-08-18 NOTE — Progress Notes (Signed)
Established Patient Office Visit  Subjective:  Patient ID: Nathaniel Ibarra, male    DOB: 10/18/1941  Age: 82 y.o. MRN: [id number]  Chief Complaint  Patient presents with   Follow-up    Personal concerns    Follow up for  lung abnormality detected on CXR at [facility]. Results unavailable to me and URI symptoms have resolved.    No other concerns at this time.   Past Medical History:  Diagnosis Date   Aortic atherosclerosis (HCC)    Basal cell carcinoma 05/18/2022   Left inf cheek - EDC   Bladder cancer (HCC) 2008   BPH (benign prostatic hyperplasia)    Bradycardia    Cardiomyopathy (HCC)    a.) TTE 08/17/2015: EF 55%; b.) LHC 09/01/2015: EF 70%; c.) LHC 11/14/2017: EF >55%; d.) TTE 11/15/2017: EF 45%; e.) MPI 08/05/2019: EF 45%; f.) TTE 05/17/2020: EF 25-30%; g.) TTE 07/16/2020: EF >55%; h.) TTE 03/02/2022: EF >55%   Cataract, bilateral    Coronary artery disease    a.) questionable NSTEMI 2016; b.) LHC/PCI 09/01/2015: 80% mLAD (2.75 x 15 mm Xience Alpine DES), 50% m-dLCx, 50% dLCx; c.) LHC 11/14/2017: 30% oD1-D1, 40% pLCx, 40% mLAD -- med mgmt   Depression    GERD (gastroesophageal reflux disease)    HFrEF (heart failure with reduced ejection fraction) (HCC)    a.) TTE 08/17/2015: EF 55%, anteroseptal HK, triv PR/TR, G1DD; b.) TTE 11/15/2017: EF 45%, mild LAE, mild MR, mod RVE; c.) TTE 05/17/2020: EF 25-30%, glob HK, mod LV dil, mild LVH, mild BAE, mod MR, triv AR; d.) TTE 07/16/2020: EF >55%, mild LVH, triv MR, mild TR/PR; e.) TTE 03/02/2022: EF >55%, mild LVH, LAE, mild MR/TR/PR, G1DD   History of 2019 novel coronavirus disease (COVID-19)    a.) 12/2020; b.) 08/04/2022   HLD (hyperlipidemia)    Hypertension    Hypothyroidism    LBBB (left bundle branch block)    Long term current use of amiodarone    NSTEMI (non-ST elevated myocardial infarction) (HCC) 2016   Osteoarthritis    Paroxysmal atrial fibrillation (HCC)    a.) CHA2DS2VASc = 5 (age x 2, HFrEF, HTN, previous  MI);  b.) rate/rhythm maintained on oral amiodarone; no chronic anticoagulation (discontinued due to cost, medical noncompliance, falls)   Peripheral edema    Pneumonia due to COVID-19 virus 12/2020   Sleep difficulties    a.) uses trazodone PRN   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   L mid dorsum forearm - ED&C   Squamous cell carcinoma of skin 05/17/2023   Left distal dorsum forearm, EDC    Past Surgical History:  Procedure Laterality Date   CARDIAC CATHETERIZATION N/A 09/01/2015   Procedure: Left Heart Cath and Coronary Angiography;  Surgeon: Laurier Nancy, MD;  Location: [facility];  Service: Cardiovascular;  Laterality: N/A;   CARDIAC CATHETERIZATION N/A 09/01/2015   Procedure: Coronary Stent Intervention;  Surgeon: Alwyn Pea, MD;  Location: [facility];  Service: Cardiovascular;  Laterality: N/A;   CATARACT EXTRACTION W/PHACO Left 11/23/2016   Procedure: CATARACT EXTRACTION PHACO AND INTRAOCULAR LENS PLACEMENT (IOC);  Surgeon: Lockie Mola, MD;  Location: [facility];  Service: Ophthalmology;  Laterality: Left;   CATARACT EXTRACTION W/PHACO Right 02/01/2017   Procedure: CATARACT EXTRACTION PHACO AND INTRAOCULAR LENS PLACEMENT (IOC) Complicated  right toric lens;  Surgeon: Lockie Mola, MD;  Location: [facility];  Service: Ophthalmology;  Laterality: Right;  Malyugin toric Lens   KNEE ARTHROPLASTY Left  10/24/2022   Procedure: COMPUTER ASSISTED TOTAL KNEE ARTHROPLASTY;  Surgeon: Donato Heinz, MD;  Location: [facility] OR[facility];  Service: Orthopedics;  Laterality: Left;   LEFT HEART CATH AND CORONARY ANGIOGRAPHY Right 11/14/2017   Procedure: LEFT HEART CATH AND CORONARY ANGIOGRAPHY;  Surgeon: Laurier Nancy, MD;  Location: [facility];  Service: Cardiovascular;  Laterality: Right;   SCROTAL EXPLORATION Left 11/24/2020   Procedure: SCROTUM EXPLORATION WITH LEFT ORCHIECTOMY;  Surgeon: Noel Christmas, MD;  Location: [facility];  Service: Urology;  Laterality: Left;   TONSILLECTOMY      Social History   Socioeconomic History   Marital status: Married    Spouse name: [name]   Number of children: 2   Years of education: Not on file   Highest education level: Not on file  Occupational History   Not on file  Tobacco Use   Smoking status: Never   Smokeless tobacco: Never  Vaping Use   Vaping status: Never Used  Substance and Sexual Activity   Alcohol use: No   Drug use: No   Sexual activity: Not Currently  Other Topics Concern   Not on file  Social History Narrative   Not on file   Social Determinants of Health   Financial Resource Strain: Not on file  Food Insecurity: No Food Insecurity (04/07/2023)   Hunger Vital Sign    Worried About Running Out of Food in the Last Year: Never true    Ran Out of Food in the Last Year: Never true  Transportation Needs: No Transportation Needs (04/07/2023)   PRAPARE - Administrator, Civil Service (Medical): No    Lack of Transportation (Non-Medical): No  Physical Activity: Not on file  Stress: Not on file  Social Connections: Not on file  Intimate Partner Violence: Not At Risk (03/28/2023)   Humiliation, Afraid, Rape, and Kick questionnaire    Fear of Current or Ex-Partner: No    Emotionally Abused: No    Physically Abused: No    Sexually Abused: No    Family History  Problem Relation Age of Onset   Emphysema Mother    Emphysema Father     No Known Allergies  Review of Systems  All other systems reviewed and are negative.      Objective:   BP 130/60   Pulse 63   Ht 6' (1.829 m)   Wt 198 lb (89.8 kg)   SpO2 93%   BMI 26.85 kg/m   Vitals:   08/18/23 1410  BP: 130/60  Pulse: 63  Height: 6' (1.829 m)  Weight: 198 lb (89.8 kg)  SpO2: 93%  BMI (Calculated): 26.85    Physical Exam Vitals reviewed.  Constitutional:      Appearance: Normal appearance.  HENT:     Head: Normocephalic.     Left Ear: There is no impacted  cerumen.     Nose: Nose normal.     Mouth/Throat:     Mouth: Mucous membranes are moist.     Pharynx: No posterior oropharyngeal erythema.  Eyes:     Extraocular Movements: Extraocular movements intact.     Pupils: Pupils are equal, round, and reactive to light.  Cardiovascular:     Rate and Rhythm: Regular rhythm.     Chest Wall: PMI is not displaced.     Pulses: Normal pulses.     Heart sounds: Normal heart sounds. No murmur heard. Pulmonary:     Effort: Pulmonary effort is normal.  Breath sounds: Normal air entry. No rhonchi or rales.  Abdominal:     General: Abdomen is flat and protuberant. Bowel sounds are normal. There is no distension.     Palpations: Abdomen is soft. There is no hepatomegaly, splenomegaly or mass.     Tenderness: There is no abdominal tenderness.  Musculoskeletal:        General: Normal range of motion.     Cervical back: Normal range of motion and neck supple.     Right lower leg: No edema.     Left lower leg: No edema.  Skin:    General: Skin is warm and dry.  Neurological:     General: No focal deficit present.     Mental Status: He is alert and oriented to person, place, and time.     Cranial Nerves: No cranial nerve deficit.     Motor: No weakness.  Psychiatric:        Mood and Affect: Mood normal.        Behavior: Behavior normal.   No results found for any visits on 08/18/23.    Assessment & Plan:  As per problem list  Problem List Items Addressed This Visit   None Visit Diagnoses     Lung nodule    -  Primary   Relevant Orders   Ambulatory referral to Pulmonology       Return if symptoms worsen or fail to improve, for Keep appt.   Total time spent: 20 minutes  Luna Fuse, MD  08/18/2023   This document may have been prepared by [facility] Voice Recognition software and as such may include unintentional dictation errors.

## 2023-08-30 ENCOUNTER — Ambulatory Visit: Payer: Self-pay

## 2023-08-30 NOTE — Patient Outreach (Signed)
  Care Coordination   08/30/2023 Name: Nathaniel Ibarra MRN: [id number] DOB: July 10, 1941   Care Coordination Outreach Attempts:  Contact states that patient is not at home.  Request call back at another time.   Follow Up Plan:  Additional outreach attempts will be made to offer the patient care coordination information and services.   Encounter Outcome:  Return call at another time.  Care Coordination Interventions:  No, not indicated    George Ina [facility] Care Coordination [phone] direct line

## 2023-09-07 DIAGNOSIS — J986 Disorders of diaphragm: Secondary | ICD-10-CM | POA: Diagnosis not present

## 2023-09-07 DIAGNOSIS — R9389 Abnormal findings on diagnostic imaging of other specified body structures: Secondary | ICD-10-CM | POA: Diagnosis not present

## 2023-09-07 DIAGNOSIS — R0602 Shortness of breath: Secondary | ICD-10-CM | POA: Diagnosis not present

## 2023-09-07 DIAGNOSIS — R918 Other nonspecific abnormal finding of lung field: Secondary | ICD-10-CM | POA: Diagnosis not present

## 2023-09-12 ENCOUNTER — Other Ambulatory Visit: Payer: Self-pay | Admitting: Specialist

## 2023-09-12 DIAGNOSIS — J986 Disorders of diaphragm: Secondary | ICD-10-CM

## 2023-09-12 DIAGNOSIS — R0602 Shortness of breath: Secondary | ICD-10-CM

## 2023-09-13 DIAGNOSIS — U071 COVID-19: Secondary | ICD-10-CM | POA: Diagnosis not present

## 2023-09-13 DIAGNOSIS — I5032 Chronic diastolic (congestive) heart failure: Secondary | ICD-10-CM | POA: Diagnosis not present

## 2023-09-13 DIAGNOSIS — R531 Weakness: Secondary | ICD-10-CM | POA: Diagnosis not present

## 2023-09-13 DIAGNOSIS — R062 Wheezing: Secondary | ICD-10-CM | POA: Diagnosis not present

## 2023-09-13 DIAGNOSIS — G4733 Obstructive sleep apnea (adult) (pediatric): Secondary | ICD-10-CM | POA: Diagnosis not present

## 2023-09-18 ENCOUNTER — Ambulatory Visit
Admission: RE | Admit: 2023-09-18 | Discharge: 2023-09-18 | Disposition: A | Discharge status: Home / Self Care | Payer: PPO | Source: Ambulatory Visit | Attending: Specialist | Admitting: Specialist

## 2023-09-18 DIAGNOSIS — R9389 Abnormal findings on diagnostic imaging of other specified body structures: Secondary | ICD-10-CM | POA: Diagnosis not present

## 2023-09-18 DIAGNOSIS — R0602 Shortness of breath: Secondary | ICD-10-CM | POA: Insufficient documentation

## 2023-09-18 DIAGNOSIS — J986 Disorders of diaphragm: Secondary | ICD-10-CM | POA: Diagnosis not present

## 2023-09-25 ENCOUNTER — Ambulatory Visit: Payer: Self-pay

## 2023-09-25 NOTE — Patient Outreach (Signed)
Care Coordination   Follow Up Visit Note   09/25/2023 Name: Nathaniel Ibarra MRN: [id number] DOB: 1941-02-21  Nathaniel Ibarra is a 82 y.o. year old male who sees Sherron Monday, MD for primary care. I spoke with  Nathaniel Ibarra and spouse, Nathaniel Ibarra by phone today.  What matters to the patients health and wellness today?  Patient  reports having a little shortness of breath. He states he is outside working on his lawnmower and that could be some of the reason.  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  Patient reports having initial visit with the pulmonologist on 09/07/23.  He states he was advised to use the incentive spirometry 3 x per day.  He states he has been compliant with doing this.   Patient states he has been advised to follow up with the pulmonologist in 4 weeks.  Patient request RNCM call his wife to review his medications.  Spoke with patient's wife, Nathaniel Ibarra.  Patients medications reviewed with spouse.    Goals Addressed             This Visit's Progress    Continued improvement post hospitalization for pneumonia and management of health conditions       Interventions Today    Flowsheet Row Most Recent Value  Chronic Disease   Chronic disease during today's visit Congestive Heart Failure (CHF), Other  [lung mass/ nodule]  General Interventions   General Interventions Discussed/Reviewed General Interventions Reviewed, Doctor Visits  [evaluation of current treatment plan for heart failure/ lung mass/nodule and patients adherence to plan as established by provider.  Assessed for heart failure symptoms and/ or increase SOB]  Doctor Visits Discussed/Reviewed Doctor Visits Reviewed  Education Interventions   Education Provided Provided Education  [reviewed heart failure symptoms and action plan. Advised patient to carry his inhaler with him to use if need arises. Advised to notify provider for ongoing mild/ moderate SOB  symptoms. Advised  to call 911 for severe symptoms.]  Pharmacy Interventions   Pharmacy Dicussed/Reviewed Pharmacy Topics Reviewed  [medications reviewed with spouse as requested by patient.]              SDOH assessments and interventions completed:  No     Care Coordination Interventions:  Yes, provided   Follow up plan: Follow up call scheduled for 11/17/23    Encounter Outcome:  Patient Visit Completed   George Ina RN,BSN,CCM [facility] Care Coordination [phone] direct line

## 2023-09-25 NOTE — Patient Instructions (Signed)
Visit Information  Thank you for taking time to visit with me today. Please don't hesitate to contact me if I can be of assistance to you.   Following are the goals we discussed today:   Goals Addressed             This Visit's Progress    Continued improvement post hospitalization for pneumonia and management of health conditions       Interventions Today    Flowsheet Row Most Recent Value  Chronic Disease   Chronic disease during today's visit Congestive Heart Failure (CHF), Other  [lung mass/ nodule]  General Interventions   General Interventions Discussed/Reviewed General Interventions Reviewed, Doctor Visits  [evaluation of current treatment plan for heart failure/ lung mass/nodule and patients adherence to plan as established by provider.  Assessed for heart failure symptoms and/ or increase SOB]  Doctor Visits Discussed/Reviewed Doctor Visits Reviewed  Education Interventions   Education Provided Provided Education  [reviewed heart failure symptoms and action plan. Advised patient to carry his inhaler with him to use if need arises. Advised to notify provider for ongoing mild/ moderate SOB symptoms. Advised  to call 911 for severe symptoms.]  Pharmacy Interventions   Pharmacy Dicussed/Reviewed Pharmacy Topics Reviewed  [medications reviewed with spouse as requested by patient.]              Our next appointment is by telephone on 11/17/23 at 9:30 am  Please call the care guide team at [phone] if you need to cancel or reschedule your appointment.   If you are experiencing a Mental Health or Behavioral Health Crisis or need someone to talk to, please call the Suicide and Crisis Lifeline: 988 call 1-800-273-TALK (toll free, 24 hour hotline)  Patient verbalizes understanding of instructions and care plan provided today and agrees to view in MyChart. Active MyChart status and patient understanding of how to access instructions and care plan via MyChart confirmed with  patient.     George Ina RN,BSN,CCM [facility] Care Coordination [phone] direct line

## 2023-10-09 ENCOUNTER — Other Ambulatory Visit: Payer: Self-pay | Admitting: Internal Medicine

## 2023-10-09 DIAGNOSIS — R942 Abnormal results of pulmonary function studies: Secondary | ICD-10-CM | POA: Diagnosis not present

## 2023-10-09 DIAGNOSIS — R0602 Shortness of breath: Secondary | ICD-10-CM | POA: Diagnosis not present

## 2023-10-09 DIAGNOSIS — G4733 Obstructive sleep apnea (adult) (pediatric): Secondary | ICD-10-CM | POA: Diagnosis not present

## 2023-10-09 DIAGNOSIS — R0609 Other forms of dyspnea: Secondary | ICD-10-CM | POA: Diagnosis not present

## 2023-10-09 DIAGNOSIS — E039 Hypothyroidism, unspecified: Secondary | ICD-10-CM

## 2023-10-11 ENCOUNTER — Other Ambulatory Visit: Payer: Self-pay | Admitting: Specialist

## 2023-10-11 DIAGNOSIS — R0609 Other forms of dyspnea: Secondary | ICD-10-CM

## 2023-10-11 DIAGNOSIS — R942 Abnormal results of pulmonary function studies: Secondary | ICD-10-CM

## 2023-10-11 DIAGNOSIS — G4733 Obstructive sleep apnea (adult) (pediatric): Secondary | ICD-10-CM

## 2023-10-16 ENCOUNTER — Ambulatory Visit
Admission: RE | Admit: 2023-10-16 | Discharge: 2023-10-16 | Disposition: A | Discharge status: Home / Self Care | Payer: PPO | Source: Ambulatory Visit | Attending: Specialist | Admitting: Specialist

## 2023-10-16 DIAGNOSIS — R0609 Other forms of dyspnea: Secondary | ICD-10-CM

## 2023-10-16 DIAGNOSIS — G4733 Obstructive sleep apnea (adult) (pediatric): Secondary | ICD-10-CM | POA: Diagnosis not present

## 2023-10-16 DIAGNOSIS — I700 Atherosclerosis of aorta: Secondary | ICD-10-CM | POA: Diagnosis not present

## 2023-10-16 DIAGNOSIS — C679 Malignant neoplasm of bladder, unspecified: Secondary | ICD-10-CM | POA: Diagnosis not present

## 2023-10-16 DIAGNOSIS — R942 Abnormal results of pulmonary function studies: Secondary | ICD-10-CM | POA: Diagnosis not present

## 2023-10-16 DIAGNOSIS — R0602 Shortness of breath: Secondary | ICD-10-CM | POA: Diagnosis not present

## 2023-10-26 DIAGNOSIS — Z96652 Presence of left artificial knee joint: Secondary | ICD-10-CM | POA: Diagnosis not present

## 2023-11-02 ENCOUNTER — Telehealth: Payer: Self-pay | Admitting: Gastroenterology

## 2023-11-02 NOTE — Telephone Encounter (Signed)
PT requesting call back to reschedule procedure

## 2023-11-03 ENCOUNTER — Ambulatory Visit: Payer: PPO | Admitting: Internal Medicine

## 2023-11-05 ENCOUNTER — Other Ambulatory Visit: Payer: Self-pay | Admitting: Internal Medicine

## 2023-11-05 DIAGNOSIS — F3289 Other specified depressive episodes: Secondary | ICD-10-CM

## 2023-11-07 ENCOUNTER — Other Ambulatory Visit: Payer: Self-pay | Admitting: Internal Medicine

## 2023-11-07 DIAGNOSIS — F3289 Other specified depressive episodes: Secondary | ICD-10-CM

## 2023-11-20 ENCOUNTER — Ambulatory Visit: Payer: Self-pay

## 2023-11-20 DIAGNOSIS — I5022 Chronic systolic (congestive) heart failure: Secondary | ICD-10-CM | POA: Diagnosis not present

## 2023-11-20 DIAGNOSIS — R0609 Other forms of dyspnea: Secondary | ICD-10-CM | POA: Diagnosis not present

## 2023-11-20 DIAGNOSIS — R942 Abnormal results of pulmonary function studies: Secondary | ICD-10-CM | POA: Diagnosis not present

## 2023-11-20 NOTE — Patient Instructions (Signed)
Visit Information  Thank you for taking time to visit with me today. Your care coordination goals have been met.   What we discussed today:   Goals Addressed             This Visit's Progress    COMPLETED: Continued improvement post hospitalization for pneumonia and management of health conditions       Interventions Today    Flowsheet Row Most Recent Value  Chronic Disease   Chronic disease during today's visit Congestive Heart Failure (CHF)  [no lung mass/ nodule.  Documented in error.]  General Interventions   General Interventions Discussed/Reviewed General Interventions Reviewed, Doctor Visits  [evaluation of current treatment plan for HF and patients adherence to plan as established by provider. Assessed for HF symptoms.]  Doctor Visits Discussed/Reviewed Doctor Visits Reviewed  [facility] upcoming provider visits. Advised to keep follow up visit with providers as scheduled]  Education Interventions   Education Provided Provided Education  [Reviewed heart failure symptoms and action plan.  Advised to notify provider for HF symptoms.  Advised to call 911 for severe heart failure symptoms.]  Provided Verbal Education On Other  [Advised to contact primary care provider for any new or ongoing symptoms.]  Pharmacy Interventions   Pharmacy Dicussed/Reviewed Pharmacy Topics Reviewed  [medications reviewed. Advised to take medications as prescribed.]              Please contact your primary care provider if you need care coordination services in the future.   If you are experiencing a Mental Health or Behavioral Health Crisis or need someone to talk to, please call the Suicide and Crisis Lifeline: 988 call 1-800-273-TALK (toll free, 24 hour hotline)  Patient verbalizes understanding of instructions and care plan provided today and agrees to view in MyChart. Active MyChart status and patient understanding of how to access instructions and care plan via MyChart confirmed with  patient.     George Ina RN,BSN,CCM [place]  [facility], [facility] coordinator / Case Manager Phone: [phone]

## 2023-11-20 NOTE — Patient Outreach (Signed)
  Care Coordination   Follow Up Visit Note   11/20/2023 Name: Nathaniel Ibarra MRN: [id number] DOB: 1941-10-08  Nathaniel Ibarra is a 81 y.o. year old male who sees Sherron Monday, MD for primary care. I spoke with  Georgeanna Harrison by phone today.  What matters to the patients health and wellness today?  Patient states he is doing well. Denies any increase in HF symptoms. Denies any change in treatment plan/ medication. Patient states he is aware of how to manage his heart failure condition and when to report symptoms to his provider.  Per chart review patient has upcoming visit with primary care provider on 12/11/23.  Patient denies having any further needs and is agreeable that care coordination goals have been met.    Goals Addressed             This Visit's Progress    COMPLETED: Continued improvement post hospitalization for pneumonia and management of health conditions       Interventions Today    Flowsheet Row Most Recent Value  Chronic Disease   Chronic disease during today's visit Congestive Heart Failure (CHF)  [no lung mass/ nodule.  Documented in error.]  General Interventions   General Interventions Discussed/Reviewed General Interventions Reviewed, Doctor Visits  [evaluation of current treatment plan for HF and patients adherence to plan as established by provider. Assessed for HF symptoms.]  Doctor Visits Discussed/Reviewed Doctor Visits Reviewed  [facility] upcoming provider visits. Advised to keep follow up visit with providers as scheduled]  Education Interventions   Education Provided Provided Education  [Reviewed heart failure symptoms and action plan.  Advised to notify provider for HF symptoms.  Advised to call 911 for severe heart failure symptoms.]  Provided Verbal Education On Other  [Advised to contact primary care provider for any new or ongoing symptoms.]  Pharmacy Interventions   Pharmacy Dicussed/Reviewed Pharmacy Topics Reviewed  [medications  reviewed. Advised to take medications as prescribed.]              SDOH assessments and interventions completed:  No     Care Coordination Interventions:  Yes, provided   Follow up plan: No further intervention required.   Encounter Outcome:  Patient Visit Completed   George Ina RN,BSN,CCM [facility]  [facility], [facility] coordinator / Case Manager Phone: [phone]

## 2023-11-27 ENCOUNTER — Telehealth: Payer: Self-pay | Admitting: Gastroenterology

## 2023-11-27 NOTE — Telephone Encounter (Signed)
Patient called in left a voicemail reschedule colonoscopy. I called the patient back to inform her we receive her message, and I send the message to the nurse.

## 2023-11-27 NOTE — Telephone Encounter (Signed)
Pt has been rescheduled for 01/03/2024, pt denies any changes with insurance or medication... I will also need to obtain a clearance

## 2023-11-28 ENCOUNTER — Other Ambulatory Visit: Payer: Self-pay

## 2023-11-28 DIAGNOSIS — R194 Change in bowel habit: Secondary | ICD-10-CM

## 2023-11-28 NOTE — Telephone Encounter (Signed)
Clearance faxed to cardiology and pulmonology

## 2023-12-04 ENCOUNTER — Other Ambulatory Visit: Payer: Self-pay | Admitting: Internal Medicine

## 2023-12-04 DIAGNOSIS — F3289 Other specified depressive episodes: Secondary | ICD-10-CM

## 2023-12-05 MED ORDER — PAROXETINE HCL 20 MG PO TABS: 20.0000 mg | ORAL_TABLET | Freq: Every day | ORAL | 0 refills | Status: DC

## 2023-12-06 ENCOUNTER — Other Ambulatory Visit: Payer: PPO

## 2023-12-06 DIAGNOSIS — I10 Essential (primary) hypertension: Secondary | ICD-10-CM | POA: Diagnosis not present

## 2023-12-06 DIAGNOSIS — E7800 Pure hypercholesterolemia, unspecified: Secondary | ICD-10-CM

## 2023-12-07 LAB — CBC WITH DIFF/PLATELET
Basophils Absolute: 0 10*3/uL (ref 0.0–0.2)
Basos: 1 %
EOS (ABSOLUTE): 0.2 10*3/uL (ref 0.0–0.4)
Eos: 3 %
Hematocrit: 43.8 % (ref 37.5–51.0)
Hemoglobin: 14.1 g/dL (ref 13.0–17.7)
Immature Grans (Abs): 0.1 10*3/uL (ref 0.0–0.1)
Immature Granulocytes: 1 %
Lymphocytes Absolute: 1.3 10*3/uL (ref 0.7–3.1)
Lymphs: 17 %
MCH: 31.3 pg (ref 26.6–33.0)
MCHC: 32.2 g/dL (ref 31.5–35.7)
MCV: 97 fL (ref 79–97)
Monocytes Absolute: 0.7 10*3/uL (ref 0.1–0.9)
Monocytes: 9 %
Neutrophils Absolute: 5.1 10*3/uL (ref 1.4–7.0)
Neutrophils: 69 %
Platelets: 277 10*3/uL (ref 150–450)
RBC: 4.5 x10E6/uL (ref 4.14–5.80)
RDW: 12.2 % (ref 11.6–15.4)
WBC: 7.4 10*3/uL (ref 3.4–10.8)

## 2023-12-07 LAB — COMPREHENSIVE METABOLIC PANEL
ALT: 24 [IU]/L (ref 0–44)
AST: 22 [IU]/L (ref 0–40)
Albumin: 4 g/dL (ref 3.7–4.7)
Alkaline Phosphatase: 88 [IU]/L (ref 44–121)
BUN/Creatinine Ratio: 22 (ref 10–24)
BUN: 24 mg/dL (ref 8–27)
Bilirubin Total: 0.5 mg/dL (ref 0.0–1.2)
CO2: 24 mmol/L (ref 20–29)
Calcium: 9.2 mg/dL (ref 8.6–10.2)
Chloride: 104 mmol/L (ref 96–106)
Creatinine, Ser: 1.07 mg/dL (ref 0.76–1.27)
Globulin, Total: 1.8 g/dL (ref 1.5–4.5)
Glucose: 102 mg/dL — ABNORMAL HIGH (ref 70–99)
Potassium: 4.8 mmol/L (ref 3.5–5.2)
Sodium: 141 mmol/L (ref 134–144)
Total Protein: 5.8 g/dL — ABNORMAL LOW (ref 6.0–8.5)
eGFR: 69 mL/min/{1.73_m2} (ref >59)

## 2023-12-07 LAB — LIPID PANEL
Chol/HDL Ratio: 3.5 {ratio} (ref 0.0–5.0)
Cholesterol, Total: 144 mg/dL (ref 100–199)
HDL: 41 mg/dL (ref >39)
LDL Chol Calc (NIH): 84 mg/dL (ref 0–99)
Triglycerides: 102 mg/dL (ref 0–149)
VLDL Cholesterol Cal: 19 mg/dL (ref 5–40)

## 2023-12-07 LAB — CK: Total CK: 66 U/L (ref 30–208)

## 2023-12-08 NOTE — Telephone Encounter (Signed)
Clearance received via fax from cardiology and pt is cleared... Form sent to be scanned into chart

## 2023-12-11 ENCOUNTER — Ambulatory Visit (INDEPENDENT_AMBULATORY_CARE_PROVIDER_SITE_OTHER): Payer: PPO | Admitting: Internal Medicine

## 2023-12-11 VITALS — BP 105/76 | HR 65 | Ht 72.0 in | Wt 195.6 lb

## 2023-12-11 DIAGNOSIS — I10 Essential (primary) hypertension: Secondary | ICD-10-CM | POA: Diagnosis not present

## 2023-12-11 DIAGNOSIS — F331 Major depressive disorder, recurrent, moderate: Secondary | ICD-10-CM | POA: Diagnosis not present

## 2023-12-11 DIAGNOSIS — E039 Hypothyroidism, unspecified: Secondary | ICD-10-CM

## 2023-12-11 DIAGNOSIS — I2510 Atherosclerotic heart disease of native coronary artery without angina pectoris: Secondary | ICD-10-CM | POA: Diagnosis not present

## 2023-12-11 DIAGNOSIS — Z0001 Encounter for general adult medical examination with abnormal findings: Secondary | ICD-10-CM

## 2023-12-11 DIAGNOSIS — Z1331 Encounter for screening for depression: Secondary | ICD-10-CM

## 2023-12-11 DIAGNOSIS — E7800 Pure hypercholesterolemia, unspecified: Secondary | ICD-10-CM | POA: Diagnosis not present

## 2023-12-11 MED ORDER — LEVOTHYROXINE SODIUM 100 MCG PO TABS
100.0000 ug | ORAL_TABLET | Freq: Every day | ORAL | 0 refills | Status: DC
Start: 2023-12-11 — End: 2024-02-16

## 2023-12-11 MED ORDER — CARIPRAZINE HCL 1.5 MG PO CAPS: 1.5000 mg | ORAL_CAPSULE | Freq: Every day | ORAL | Status: AC

## 2023-12-11 MED ORDER — CARIPRAZINE HCL 1.5 MG PO CAPS
1.5000 mg | ORAL_CAPSULE | Freq: Every day | ORAL | 2 refills | Status: AC
Start: 2023-12-11 — End: 2024-03-10

## 2023-12-11 NOTE — Progress Notes (Signed)
Established Patient Office Visit  Subjective:  Patient ID: Nathaniel Ibarra, male    DOB: 18-Aug-1941  Age: 82 y.o. MRN: [id number]  No chief complaint on file.   No new complaints except worsening depression, here for AWV refer to quality metrics and scanned documents. Lipids improved, LDL and TC well controlled on lab review. Triglycerides also satisfactory. Awaits colonoscopy for chronic diarrhea.     No other concerns at this time.   Past Medical History:  Diagnosis Date   Aortic atherosclerosis (HCC)    Basal cell carcinoma 05/18/2022   Left inf cheek - EDC   Bladder cancer (HCC) 2008   BPH (benign prostatic hyperplasia)    Bradycardia    Cardiomyopathy (HCC)    a.) TTE 08/17/2015: EF 55%; b.) LHC 09/01/2015: EF 70%; c.) LHC 11/14/2017: EF >55%; d.) TTE 11/15/2017: EF 45%; e.) MPI 08/05/2019: EF 45%; f.) TTE 05/17/2020: EF 25-30%; g.) TTE 07/16/2020: EF >55%; h.) TTE 03/02/2022: EF >55%   Cataract, bilateral    Coronary artery disease    a.) questionable NSTEMI 2016; b.) LHC/PCI 09/01/2015: 80% mLAD (2.75 x 15 mm Xience Alpine DES), 50% m-dLCx, 50% dLCx; c.) LHC 11/14/2017: 30% oD1-D1, 40% pLCx, 40% mLAD -- med mgmt   Depression    GERD (gastroesophageal reflux disease)    HFrEF (heart failure with reduced ejection fraction) (HCC)    a.) TTE 08/17/2015: EF 55%, anteroseptal HK, triv PR/TR, G1DD; b.) TTE 11/15/2017: EF 45%, mild LAE, mild MR, mod RVE; c.) TTE 05/17/2020: EF 25-30%, glob HK, mod LV dil, mild LVH, mild BAE, mod MR, triv AR; d.) TTE 07/16/2020: EF >55%, mild LVH, triv MR, mild TR/PR; e.) TTE 03/02/2022: EF >55%, mild LVH, LAE, mild MR/TR/PR, G1DD   History of 2019 novel coronavirus disease (COVID-19)    a.) 12/2020; b.) 08/04/2022   HLD (hyperlipidemia)    Hypertension    Hypothyroidism    LBBB (left bundle branch block)    Long term current use of amiodarone    NSTEMI (non-ST elevated myocardial infarction) (HCC) 2016   Osteoarthritis    Paroxysmal atrial  fibrillation (HCC)    a.) CHA2DS2VASc = 5 (age x 2, HFrEF, HTN, previous MI);  b.) rate/rhythm maintained on oral amiodarone; no chronic anticoagulation (discontinued due to cost, medical noncompliance, falls)   Peripheral edema    Pneumonia due to COVID-19 virus 12/2020   Sleep difficulties    a.) uses trazodone PRN   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   L mid dorsum forearm - ED&C   Squamous cell carcinoma of skin 05/17/2023   Left distal dorsum forearm, EDC    Past Surgical History:  Procedure Laterality Date   CARDIAC CATHETERIZATION N/A 09/01/2015   Procedure: Left Heart Cath and Coronary Angiography;  Surgeon: Laurier Nancy, MD;  Location: [facility];  Service: Cardiovascular;  Laterality: N/A;   CARDIAC CATHETERIZATION N/A 09/01/2015   Procedure: Coronary Stent Intervention;  Surgeon: Alwyn Pea, MD;  Location: [facility];  Service: Cardiovascular;  Laterality: N/A;   CATARACT EXTRACTION W/PHACO Left 11/23/2016   Procedure: CATARACT EXTRACTION PHACO AND INTRAOCULAR LENS PLACEMENT (IOC);  Surgeon: Lockie Mola, MD;  Location: [facility];  Service: Ophthalmology;  Laterality: Left;   CATARACT EXTRACTION W/PHACO Right 02/01/2017   Procedure: CATARACT EXTRACTION PHACO AND INTRAOCULAR LENS PLACEMENT (IOC) Complicated  right toric lens;  Surgeon: Lockie Mola, MD;  Location: [facility];  Service: Ophthalmology;  Laterality: Right;  Malyugin toric Lens  KNEE ARTHROPLASTY Left 10/24/2022   Procedure: COMPUTER ASSISTED TOTAL KNEE ARTHROPLASTY;  Surgeon: Donato Heinz, MD;  Location: [facility];  Service: Orthopedics;  Laterality: Left;   LEFT HEART CATH AND CORONARY ANGIOGRAPHY Right 11/14/2017   Procedure: LEFT HEART CATH AND CORONARY ANGIOGRAPHY;  Surgeon: Laurier Nancy, MD;  Location: [facility];  Service: Cardiovascular;  Laterality: Right;   SCROTAL EXPLORATION Left 11/24/2020   Procedure: SCROTUM  EXPLORATION WITH LEFT ORCHIECTOMY;  Surgeon: Noel Christmas, MD;  Location: [facility];  Service: Urology;  Laterality: Left;   TONSILLECTOMY      Social History   Socioeconomic History   Marital status: Married    Spouse name: [name]   Number of children: 2   Years of education: Not on file   Highest education level: Not on file  Occupational History   Not on file  Tobacco Use   Smoking status: Never   Smokeless tobacco: Never  Vaping Use   Vaping status: Never Used  Substance and Sexual Activity   Alcohol use: No   Drug use: No   Sexual activity: Not Currently  Other Topics Concern   Not on file  Social History Narrative   Not on file   Social Drivers of Health   Financial Resource Strain: Low Risk  (09/07/2023)   Received from [facility]   Overall Financial Resource Strain (CARDIA)    Difficulty of Paying Living Expenses: Not hard at all  Food Insecurity: No Food Insecurity (09/07/2023)   [facility] from [facility]   Hunger Vital Sign    Worried About Running Out of Food in the Last Year: Never true    Ran Out of Food in the Last Year: Never true  Transportation Needs: No Transportation Needs (09/07/2023)   Received from [facility] - Transportation    In the past 12 months, has lack of transportation kept you from medical appointments or from getting medications?: No    Lack of Transportation (Non-Medical): No  Physical Activity: Not on file  Stress: Not on file  Social Connections: Not on file  Intimate Partner Violence: Not At Risk (03/28/2023)   Humiliation, Afraid, Rape, and Kick questionnaire    Fear of Current or Ex-Partner: No    Emotionally Abused: No    Physically Abused: No    Sexually Abused: No    Family History  Problem Relation Age of Onset   Emphysema Mother    Emphysema Father     No Known Allergies  Outpatient Medications Prior to Visit  Medication Sig   acetaminophen  (TYLENOL) 325 MG tablet Take 2 tablets (650 mg total) by mouth every 6 (six) hours as needed for mild pain or headache (fever >/= 101).   albuterol (VENTOLIN HFA) 108 (90 Base) MCG/ACT inhaler Inhale 1 puff into the lungs every 4 (four) hours as needed.   amiodarone (PACERONE) 200 MG tablet Take 1 tablet (200 mg total) by mouth daily.   famotidine (PEPCID) 20 MG tablet Take 1 tablet (20 mg total) by mouth 2 (two) times daily for 15 days.   finasteride (PROSCAR) 5 MG tablet Take 1 tablet (5 mg total) by mouth daily.   ipratropium (ATROVENT) 0.06 % nasal spray Place 2 sprays into the nose 3 (three) times daily as needed.   levothyroxine (SYNTHROID) 100 MCG tablet TAKE 1 TABLET BY MOUTH EVERY DAY IN THE MORNING   naproxen (NAPROSYN) 500 MG tablet Take 500  mg by mouth 2 (two) times daily as needed for mild pain. (Patient not taking: Reported on 09/25/2023)   ondansetron (ZOFRAN-ODT) 4 MG disintegrating tablet Take 1 tablet (4 mg total) by mouth every 8 (eight) hours as needed for nausea or vomiting. (Patient not taking: Reported on 09/25/2023)   PARoxetine (PAXIL) 20 MG tablet Take 1 tablet (20 mg total) by mouth daily.   promethazine-dextromethorphan (PROMETHAZINE-DM) 6.25-15 MG/5ML syrup Take 5 mLs by mouth every 6 (six) hours as needed for cough. (Patient not taking: Reported on 09/25/2023)   rosuvastatin (CRESTOR) 10 MG tablet Take 1 tablet (10 mg total) by mouth daily.   spironolactone (ALDACTONE) 25 MG tablet Take 0.5 tablets (12.5 mg total) by mouth daily.   tamsulosin (FLOMAX) 0.4 MG CAPS capsule Take 0.8 mg by mouth at bedtime.    No facility-administered medications prior to visit.    Review of Systems  Constitutional: Negative.  Negative for weight loss.  HENT: Negative.    Eyes: Negative.   Respiratory:  Positive for shortness of breath. Negative for cough.   Cardiovascular: Negative.   Gastrointestinal: Negative.   Genitourinary: Negative.   Skin: Negative.   Neurological: Negative.    Endo/Heme/Allergies: Negative.   Psychiatric/Behavioral:  Positive for depression.   All other systems reviewed and are negative.      Objective:   BP 105/76   Pulse 65   Ht 6' (1.829 m)   Wt 195 lb 9.6 oz (88.7 kg)   SpO2 97%   BMI 26.53 kg/m   Vitals:   12/11/23 1508  BP: 105/76  Pulse: 65  Height: 6' (1.829 m)  Weight: 195 lb 9.6 oz (88.7 kg)  SpO2: 97%  BMI (Calculated): 26.52    Physical Exam Vitals reviewed.  Constitutional:      Appearance: Normal appearance.  HENT:     Head: Normocephalic.     Left Ear: There is no impacted cerumen.     Nose: Nose normal.     Mouth/Throat:     Mouth: Mucous membranes are moist.     Pharynx: No posterior oropharyngeal erythema.  Eyes:     Extraocular Movements: Extraocular movements intact.     Pupils: Pupils are equal, round, and reactive to light.  Cardiovascular:     Rate and Rhythm: Regular rhythm.     Chest Wall: PMI is not displaced.     Pulses: Normal pulses.     Heart sounds: Normal heart sounds. No murmur heard. Pulmonary:     Effort: Pulmonary effort is normal.     Breath sounds: Normal air entry. No rhonchi or rales.  Abdominal:     General: Abdomen is flat and protuberant. Bowel sounds are normal. There is no distension.     Palpations: Abdomen is soft. There is no hepatomegaly, splenomegaly or mass.     Tenderness: There is no abdominal tenderness.  Musculoskeletal:        General: Normal range of motion.     Cervical back: Normal range of motion and neck supple.     Right lower leg: No edema.     Left lower leg: No edema.  Skin:    General: Skin is warm and dry.  Neurological:     General: No focal deficit present.     Mental Status: He is alert and oriented to person, place, and time.     Cranial Nerves: No cranial nerve deficit.     Motor: No weakness.  Psychiatric:  Mood and Affect: Mood normal.        Behavior: Behavior normal.      No results found for any visits on  12/11/23.  Recent Results (from the past 2160 hours)  CBC With Diff/Platelet     Status: None   Collection Time: 12/06/23 11:15 AM  Result Value Ref Range   WBC 7.4 3.4 - 10.8 x10E3/uL   RBC 4.50 4.14 - 5.80 x10E6/uL   Hemoglobin 14.1 13.0 - 17.7 g/dL   Hematocrit 40.9 81.1 - 51.0 %   MCV 97 79 - 97 fL   MCH 31.3 26.6 - 33.0 pg   MCHC 32.2 31.5 - 35.7 g/dL   RDW 91.4 78.2 - 95.6 %   Platelets 277 150 - 450 x10E3/uL   Neutrophils 69 Not Estab. %   Lymphs 17 Not Estab. %   Monocytes 9 Not Estab. %   Eos 3 Not Estab. %   Basos 1 Not Estab. %   Neutrophils Absolute 5.1 1.4 - 7.0 x10E3/uL   Lymphocytes Absolute 1.3 0.7 - 3.1 x10E3/uL   Monocytes Absolute 0.7 0.1 - 0.9 x10E3/uL   EOS (ABSOLUTE) 0.2 0.0 - 0.4 x10E3/uL   Basophils Absolute 0.0 0.0 - 0.2 x10E3/uL   Immature Granulocytes 1 Not Estab. %   Immature Grans (Abs) 0.1 0.0 - 0.1 x10E3/uL  CK     Status: None   Collection Time: 12/06/23 11:15 AM  Result Value Ref Range   Total CK 66 30 - 208 U/L  Lipid panel     Status: None   Collection Time: 12/06/23 11:15 AM  Result Value Ref Range   Cholesterol, Total 144 100 - 199 mg/dL   Triglycerides 213 0 - 149 mg/dL   HDL 41 >08 mg/dL   VLDL Cholesterol Cal 19 5 - 40 mg/dL   LDL Chol Calc (NIH) 84 0 - 99 mg/dL   Chol/HDL Ratio 3.5 0.0 - 5.0 ratio    Comment:                                   T. Chol/HDL Ratio                                             Men  Women                               1/2 Avg.Risk  3.4    3.3                                   Avg.Risk  5.0    4.4                                2X Avg.Risk  9.6    7.1                                3X Avg.Risk 23.4   11.0   Comprehensive metabolic panel     Status: Abnormal   Collection Time: 12/06/23 11:15 AM  Result Value Ref Range  Glucose 102 (H) 70 - 99 mg/dL   BUN 24 8 - 27 mg/dL   Creatinine, Ser 8.46 0.76 - 1.27 mg/dL   eGFR 69 >96 EX/BMW/4.13   BUN/Creatinine Ratio 22 10 - 24   Sodium 141 134 - 144  mmol/L   Potassium 4.8 3.5 - 5.2 mmol/L   Chloride 104 96 - 106 mmol/L   CO2 24 20 - 29 mmol/L   Calcium 9.2 8.6 - 10.2 mg/dL   Total Protein 5.8 (L) 6.0 - 8.5 g/dL   Albumin 4.0 3.7 - 4.7 g/dL   Globulin, Total 1.8 1.5 - 4.5 g/dL   Bilirubin Total 0.5 0.0 - 1.2 mg/dL   Alkaline Phosphatase 88 44 - 121 IU/L   AST 22 0 - 40 IU/L   ALT 24 0 - 44 IU/L      Assessment & Plan:  As per problem list  Problem List Items Addressed This Visit   None   No follow-ups on file.   Total time spent: 30 minutes  Luna Fuse, MD  12/11/2023   This document may have been prepared by [facility] Voice Recognition software and as such may include unintentional dictation errors.

## 2023-12-21 DIAGNOSIS — R942 Abnormal results of pulmonary function studies: Secondary | ICD-10-CM | POA: Diagnosis not present

## 2023-12-21 DIAGNOSIS — R0609 Other forms of dyspnea: Secondary | ICD-10-CM | POA: Diagnosis not present

## 2023-12-21 DIAGNOSIS — I5022 Chronic systolic (congestive) heart failure: Secondary | ICD-10-CM | POA: Diagnosis not present

## 2023-12-21 NOTE — Telephone Encounter (Signed)
 Telephone Encounter - Madelaine Bhat, CMA - 12/11/2023 1:35 PM EST Formatting of this note might be different from the original. Faxed clearance to [employer] Electronically signed by Madelaine Bhat, CMA at 12/11/2023 1:36 PM EST

## 2023-12-25 ENCOUNTER — Other Ambulatory Visit: Payer: Self-pay | Admitting: Internal Medicine

## 2023-12-25 DIAGNOSIS — F3289 Other specified depressive episodes: Secondary | ICD-10-CM

## 2024-01-01 ENCOUNTER — Telehealth: Payer: Self-pay | Admitting: Gastroenterology

## 2024-01-01 NOTE — Telephone Encounter (Signed)
 Pt has medication question before procedure on 12/25/2023 please return call

## 2024-01-01 NOTE — Telephone Encounter (Signed)
 Spoke to pt and his wife, they stated they did not remember why they called... My direct line # was given to pt's spouse if they have any additional questions... Nothing further needed at this time

## 2024-01-03 ENCOUNTER — Ambulatory Visit: Payer: PPO | Admitting: Dermatology

## 2024-01-04 ENCOUNTER — Encounter: Payer: Self-pay | Admitting: Gastroenterology

## 2024-01-04 ENCOUNTER — Encounter
Admission: RE | Disposition: A | Discharge status: Home / Self Care | Payer: Self-pay | Source: Home / Self Care | Attending: Gastroenterology

## 2024-01-04 ENCOUNTER — Ambulatory Visit: Payer: PPO | Admitting: Anesthesiology

## 2024-01-04 ENCOUNTER — Other Ambulatory Visit: Payer: Self-pay

## 2024-01-04 ENCOUNTER — Ambulatory Visit
Admission: RE | Admit: 2024-01-04 | Discharge: 2024-01-04 | Disposition: A | Discharge status: Home / Self Care | Payer: PPO | Attending: Gastroenterology | Admitting: Gastroenterology

## 2024-01-04 DIAGNOSIS — D122 Benign neoplasm of ascending colon: Secondary | ICD-10-CM | POA: Diagnosis not present

## 2024-01-04 DIAGNOSIS — F32A Depression, unspecified: Secondary | ICD-10-CM | POA: Insufficient documentation

## 2024-01-04 DIAGNOSIS — I25119 Atherosclerotic heart disease of native coronary artery with unspecified angina pectoris: Secondary | ICD-10-CM | POA: Diagnosis not present

## 2024-01-04 DIAGNOSIS — I5022 Chronic systolic (congestive) heart failure: Secondary | ICD-10-CM | POA: Insufficient documentation

## 2024-01-04 DIAGNOSIS — I252 Old myocardial infarction: Secondary | ICD-10-CM | POA: Diagnosis not present

## 2024-01-04 DIAGNOSIS — D123 Benign neoplasm of transverse colon: Secondary | ICD-10-CM | POA: Diagnosis not present

## 2024-01-04 DIAGNOSIS — E039 Hypothyroidism, unspecified: Secondary | ICD-10-CM | POA: Insufficient documentation

## 2024-01-04 DIAGNOSIS — Z955 Presence of coronary angioplasty implant and graft: Secondary | ICD-10-CM | POA: Diagnosis not present

## 2024-01-04 DIAGNOSIS — R194 Change in bowel habit: Secondary | ICD-10-CM

## 2024-01-04 DIAGNOSIS — I110 Hypertensive heart disease with heart failure: Secondary | ICD-10-CM | POA: Diagnosis not present

## 2024-01-04 DIAGNOSIS — I480 Paroxysmal atrial fibrillation: Secondary | ICD-10-CM | POA: Diagnosis not present

## 2024-01-04 DIAGNOSIS — K529 Noninfective gastroenteritis and colitis, unspecified: Secondary | ICD-10-CM | POA: Diagnosis not present

## 2024-01-04 DIAGNOSIS — K640 First degree hemorrhoids: Secondary | ICD-10-CM | POA: Insufficient documentation

## 2024-01-04 DIAGNOSIS — K5730 Diverticulosis of large intestine without perforation or abscess without bleeding: Secondary | ICD-10-CM | POA: Diagnosis not present

## 2024-01-04 DIAGNOSIS — K635 Polyp of colon: Secondary | ICD-10-CM

## 2024-01-04 HISTORY — PX: POLYPECTOMY: SHX5525

## 2024-01-04 HISTORY — PX: COLONOSCOPY WITH PROPOFOL: SHX5780

## 2024-01-04 HISTORY — PX: BIOPSY: SHX5522

## 2024-01-04 SURGERY — COLONOSCOPY WITH PROPOFOL
Anesthesia: General

## 2024-01-04 MED ORDER — EPHEDRINE 5 MG/ML INJ
INTRAVENOUS | Status: AC
  Filled 2024-01-04: qty 5

## 2024-01-04 MED ORDER — PROPOFOL 500 MG/50ML IV EMUL
INTRAVENOUS | Status: DC | PRN
  Administered 2024-01-04: 130 ug/kg/min via INTRAVENOUS

## 2024-01-04 MED ORDER — EPHEDRINE SULFATE-NACL 50-0.9 MG/10ML-% IV SOSY
PREFILLED_SYRINGE | INTRAVENOUS | Status: DC | PRN
  Administered 2024-01-04 (×2): 5 mg via INTRAVENOUS

## 2024-01-04 MED ORDER — PROPOFOL 10 MG/ML IV BOLUS
INTRAVENOUS | Status: DC | PRN
  Administered 2024-01-04: 40 mg via INTRAVENOUS

## 2024-01-04 MED ORDER — SODIUM CHLORIDE 0.9 % IV SOLN: INTRAVENOUS | Status: DC

## 2024-01-04 NOTE — Anesthesia Postprocedure Evaluation (Signed)
Anesthesia Post Note  Patient: Nathaniel Ibarra  Procedure(s) Performed: COLONOSCOPY WITH PROPOFOL POLYPECTOMY BIOPSY  Patient location during evaluation: PACU Anesthesia Type: General Level of consciousness: awake and alert Pain management: pain level controlled Vital Signs Assessment: post-procedure vital signs reviewed and stable Respiratory status: spontaneous breathing, nonlabored ventilation and respiratory function stable Cardiovascular status: blood pressure returned to baseline and stable Postop Assessment: no apparent nausea or vomiting Anesthetic complications: no   No notable events documented.   Last Vitals:  Vitals:   01/04/24 0925 01/04/24 0935  BP: (!) 106/51 (!) 141/51  Pulse: (!) 53 (!) 51  Resp: 15   Temp:    SpO2: 96% 97%    Last Pain:  Vitals:   01/04/24 0935  TempSrc:   PainSc: 0-No pain                 Foye Deer

## 2024-01-04 NOTE — H&P (Signed)
Midge Minium, MD [facility] [address] [place], [place] [address] [phone] Fax : [phone]  Primary Care Physician:  Sherron Monday, MD Primary Gastroenterologist:  Dr. Servando Snare  Pre-Procedure History & Physical: HPI:  ZAKARIYE PERSAD is a 83 y.o. male is here for an colonoscopy.   Past Medical History:  Diagnosis Date   Aortic atherosclerosis (HCC)    Basal cell carcinoma 05/18/2022   Left inf cheek - EDC   Bladder cancer (HCC) 2008   BPH (benign prostatic hyperplasia)    Bradycardia    Cardiomyopathy (HCC)    a.) TTE 08/17/2015: EF 55%; b.) LHC 09/01/2015: EF 70%; c.) LHC 11/14/2017: EF >55%; d.) TTE 11/15/2017: EF 45%; e.) MPI 08/05/2019: EF 45%; f.) TTE 05/17/2020: EF 25-30%; g.) TTE 07/16/2020: EF >55%; h.) TTE 03/02/2022: EF >55%   Cataract, bilateral    Coronary artery disease    a.) questionable NSTEMI 2016; b.) LHC/PCI 09/01/2015: 80% mLAD (2.75 x 15 mm Xience Alpine DES), 50% m-dLCx, 50% dLCx; c.) LHC 11/14/2017: 30% oD1-D1, 40% pLCx, 40% mLAD -- med mgmt   Depression    GERD (gastroesophageal reflux disease)    HFrEF (heart failure with reduced ejection fraction) (HCC)    a.) TTE 08/17/2015: EF 55%, anteroseptal HK, triv PR/TR, G1DD; b.) TTE 11/15/2017: EF 45%, mild LAE, mild MR, mod RVE; c.) TTE 05/17/2020: EF 25-30%, glob HK, mod LV dil, mild LVH, mild BAE, mod MR, triv AR; d.) TTE 07/16/2020: EF >55%, mild LVH, triv MR, mild TR/PR; e.) TTE 03/02/2022: EF >55%, mild LVH, LAE, mild MR/TR/PR, G1DD   History of 2019 novel coronavirus disease (COVID-19)    a.) 12/2020; b.) 08/04/2022   HLD (hyperlipidemia)    Hypertension    Hypothyroidism    LBBB (left bundle branch block)    Long term current use of amiodarone    NSTEMI (non-ST elevated myocardial infarction) (HCC) 2016   Osteoarthritis    Paroxysmal atrial fibrillation (HCC)    a.) CHA2DS2VASc = 5 (age x 2, HFrEF, HTN, previous MI);  b.) rate/rhythm maintained on oral amiodarone; no chronic  anticoagulation (discontinued due to cost, medical noncompliance, falls)   Peripheral edema    Pneumonia due to COVID-19 virus 12/2020   Sleep difficulties    a.) uses trazodone PRN   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   L mid dorsum forearm - ED&C   Squamous cell carcinoma of skin 05/17/2023   Left distal dorsum forearm, EDC    Past Surgical History:  Procedure Laterality Date   CARDIAC CATHETERIZATION N/A 09/01/2015   Procedure: Left Heart Cath and Coronary Angiography;  Surgeon: Laurier Nancy, MD;  Location: [facility];  Service: Cardiovascular;  Laterality: N/A;   CARDIAC CATHETERIZATION N/A 09/01/2015   Procedure: Coronary Stent Intervention;  Surgeon: Alwyn Pea, MD;  Location: [facility];  Service: Cardiovascular;  Laterality: N/A;   CATARACT EXTRACTION W/PHACO Left 11/23/2016   Procedure: CATARACT EXTRACTION PHACO AND INTRAOCULAR LENS PLACEMENT (IOC);  Surgeon: Lockie Mola, MD;  Location: [facility];  Service: Ophthalmology;  Laterality: Left;   CATARACT EXTRACTION W/PHACO Right 02/01/2017   Procedure: CATARACT EXTRACTION PHACO AND INTRAOCULAR LENS PLACEMENT (IOC) Complicated  right toric lens;  Surgeon: Lockie Mola, MD;  Location: [facility];  Service: Ophthalmology;  Laterality: Right;  Malyugin toric Lens   KNEE ARTHROPLASTY Left 10/24/2022   Procedure: COMPUTER ASSISTED TOTAL KNEE ARTHROPLASTY;  Surgeon: Donato Heinz, MD;  Location: [facility] ORS;  Service:  Orthopedics;  Laterality: Left;   LEFT HEART CATH AND CORONARY ANGIOGRAPHY Right 11/14/2017   Procedure: LEFT HEART CATH AND CORONARY ANGIOGRAPHY;  Surgeon: Laurier Nancy, MD;  Location: [facility];  Service: Cardiovascular;  Laterality: Right;   SCROTAL EXPLORATION Left 11/24/2020   Procedure: SCROTUM EXPLORATION WITH LEFT ORCHIECTOMY;  Surgeon: Noel Christmas, MD;  Location: [facility];  Service: Urology;  Laterality: Left;   TONSILLECTOMY       Prior to Admission medications   Medication Sig Start Date End Date Taking? Authorizing Provider  acetaminophen (TYLENOL) 325 MG tablet Take 2 tablets (650 mg total) by mouth every 6 (six) hours as needed for mild pain or headache (fever >/= 101). 01/13/21  Yes Lonia Blood, MD  albuterol (VENTOLIN HFA) 108 (90 Base) MCG/ACT inhaler Inhale 1 puff into the lungs every 4 (four) hours as needed. 10/27/21  Yes [provider]  amiodarone (PACERONE) 200 MG tablet Take 1 tablet (200 mg total) by mouth daily. 05/18/20  Yes Wieting, Richard, MD  cariprazine (VRAYLAR) 1.5 MG capsule Take 1 capsule (1.5 mg total) by mouth daily. 12/11/23 03/10/24 Yes Sherron Monday, MD  famotidine (PEPCID) 20 MG tablet Take 1 tablet (20 mg total) by mouth 2 (two) times daily for 15 days. 05/07/23 01/04/24 Yes Sharman Cheek, MD  finasteride (PROSCAR) 5 MG tablet Take 1 tablet (5 mg total) by mouth daily. 05/18/20  Yes Wieting, Richard, MD  PARoxetine (PAXIL) 20 MG tablet TAKE 1 TABLET BY MOUTH EVERY DAY 12/25/23  Yes Tejan-Sie, Marcelino Freestone, MD  rosuvastatin (CRESTOR) 10 MG tablet Take 1 tablet (10 mg total) by mouth daily. 07/28/23 07/27/24 Yes Sherron Monday, MD  spironolactone (ALDACTONE) 25 MG tablet Take 0.5 tablets (12.5 mg total) by mouth daily. 05/19/20  Yes Wieting, Richard, MD  tamsulosin (FLOMAX) 0.4 MG CAPS capsule Take 0.8 mg by mouth at bedtime.    Yes [provider]  cariprazine (VRAYLAR) 1.5 MG capsule Take 1 capsule (1.5 mg total) by mouth daily for 14 days. 12/11/23 12/25/23  Sherron Monday, MD  ipratropium (ATROVENT) 0.06 % nasal spray Place 2 sprays into the nose 3 (three) times daily as needed. 03/26/23 03/25/24  [provider]  levothyroxine (SYNTHROID) 100 MCG tablet Take 1 tablet (100 mcg total) by mouth daily before breakfast. 12/11/23 03/10/24  Sherron Monday, MD  naproxen (NAPROSYN) 500 MG tablet Take 500 mg by mouth 2 (two) times daily as needed for mild  pain. Patient not taking: Reported on 09/25/2023 02/22/23   [provider]  ondansetron (ZOFRAN-ODT) 4 MG disintegrating tablet Take 1 tablet (4 mg total) by mouth every 8 (eight) hours as needed for nausea or vomiting. Patient not taking: Reported on 09/25/2023 05/07/23   Sharman Cheek, MD  promethazine-dextromethorphan (PROMETHAZINE-DM) 6.25-15 MG/5ML syrup Take 5 mLs by mouth every 6 (six) hours as needed for cough. Patient not taking: Reported on 09/25/2023 03/26/23   [provider]    Allergies as of 11/28/2023   (No Known Allergies)    Family History  Problem Relation Age of Onset   Emphysema Mother    Emphysema Father     Social History   Socioeconomic History   Marital status: Married    Spouse name: [name]   Number of children: 2   Years of education: Not on file   Highest education level: Not on file  Occupational History   Not on file  Tobacco Use   Smoking status: Never  Smokeless tobacco: Never  Vaping Use   Vaping status: Never Used  Substance and Sexual Activity   Alcohol use: No   Drug use: No   Sexual activity: Not Currently  Other Topics Concern   Not on file  Social History Narrative   Not on file   Social Drivers of Health   Financial Resource Strain: Low Risk  (09/07/2023)   Received from [facility]   Overall Financial Resource Strain (CARDIA)    Difficulty of Paying Living Expenses: Not hard at all  Food Insecurity: No Food Insecurity (09/07/2023)   Received from [facility]   Hunger Vital Sign    Worried About Running Out of Food in the Last Year: Never true    Ran Out of Food in the Last Year: Never true  Transportation Needs: No Transportation Needs (09/07/2023)   Received from [facility]    In the past 12 months, has lack of transportation kept you from medical appointments or from getting medications?: No    Lack of Transportation  (Non-Medical): No  Physical Activity: Not on file  Stress: Not on file  Social Connections: Not on file  Intimate Partner Violence: Not At Risk (03/28/2023)   Humiliation, Afraid, Rape, and Kick questionnaire    Fear of Current or Ex-Partner: No    Emotionally Abused: No    Physically Abused: No    Sexually Abused: No    Review of Systems: See HPI, otherwise negative ROS  Physical Exam: BP (!) 155/62   Pulse (!) 57   Temp (!) 97.2 F (36.2 C) (Temporal)   Wt 86.2 kg   SpO2 95%   BMI 25.77 kg/m  General:   Alert,  pleasant and cooperative in NAD Head:  Normocephalic and atraumatic. Neck:  Supple; no masses or thyromegaly. Lungs:  Clear throughout to auscultation.    Heart:  Regular rate and rhythm. Abdomen:  Soft, nontender and nondistended. Normal bowel sounds, without guarding, and without rebound.   Neurologic:  Alert and  oriented x4;  grossly normal neurologically.  Impression/Plan: KEAON SCHLOUGH is here for an colonoscopy to be performed for diarrhea  Risks, benefits, limitations, and alternatives regarding  colonoscopy have been reviewed with the patient.  Questions have been answered.  All parties agreeable.   Midge Minium, MD  01/04/2024, 8:44 AM

## 2024-01-04 NOTE — Op Note (Signed)
[facility] Patient Name: Nathaniel Ibarra Procedure Date: 01/04/2024 8:36 AM MRN: [id number] Account #: [id number] Date of Birth: [birth year] Admit Type: Outpatient Age: 83 Room: [facility] ENDO ROOM 4 Gender: Male Note Status: Finalized Instrument Name: Nelda Marseille [id number] Procedure:             Colonoscopy Indications:           Chronic diarrhea Providers:             Midge Minium MD, MD Referring MD:          Silas Flood. Ellsworth Lennox, MD (Referring MD) Medicines:             Propofol per Anesthesia Complications:         No immediate complications. Procedure:             Pre-Anesthesia Assessment:                        - Prior to the procedure, a History and Physical was                         performed, and patient medications and allergies were                         reviewed. The patient's tolerance of previous                         anesthesia was also reviewed. The risks and benefits                         of the procedure and the sedation options and risks                         were discussed with the patient. All questions were                         answered, and informed consent was obtained. Prior                         Anticoagulants: The patient has taken no anticoagulant                         or antiplatelet agents. ASA Grade Assessment: II - A                         patient with mild systemic disease. After reviewing                         the risks and benefits, the patient was deemed in                         satisfactory condition to undergo the procedure.                        After obtaining informed consent, the colonoscope was                         passed under direct vision. Throughout the procedure,  the patient's blood pressure, pulse, and oxygen                         saturations were monitored continuously. The                         Colonoscope was introduced through the anus and                          advanced to the the cecum, identified by appendiceal                         orifice and ileocecal valve. The colonoscopy was                         performed without difficulty. The patient tolerated                         the procedure well. The quality of the bowel                         preparation was good. Findings:      The perianal and digital rectal examinations were normal.      Five sessile polyps were found in the ascending colon. The polyps were 3       to 6 mm in size. These polyps were removed with a cold snare. Resection       and retrieval were complete.      A 4 mm polyp was found in the transverse colon. The polyp was sessile.       The polyp was removed with a cold snare. Resection and retrieval were       complete.      Multiple small-mouthed diverticula were found in the sigmoid colon and       descending colon.      Non-bleeding internal hemorrhoids were found during retroflexion. The       hemorrhoids were Grade I (internal hemorrhoids that do not prolapse).      Biopsies were taken with a cold forceps in the entire colon for       histology. Random biopsies were obtained with cold forceps for histology       randomly. Impression:            - Five 3 to 6 mm polyps in the ascending colon,                         removed with a cold snare. Resected and retrieved.                        - One 4 mm polyp in the transverse colon, removed with                         a cold snare. Resected and retrieved.                        - Diverticulosis in the sigmoid colon and in the                         descending colon.                        -  Non-bleeding internal hemorrhoids.                        - Biopsies were taken with a cold forceps for                         histology in the entire colon.                        - Random biopsies were obtained. Recommendation:        - Discharge patient to home.                        - Resume previous diet.                         - Continue present medications.                        - Await pathology results. Procedure Code(s):     --- Professional ---                        [phone], Colonoscopy, flexible; with removal of                         tumor(s), polyp(s), or other lesion(s) by snare                         technique                        45380, 59, Colonoscopy, flexible; with biopsy, single                         or multiple Diagnosis Code(s):     --- Professional ---                        K52.9, Noninfective gastroenteritis and colitis,                         unspecified                        D12.3, Benign neoplasm of transverse colon (hepatic                         flexure or splenic flexure) CPT copyright 2022 American Medical Association. All rights reserved. The codes documented in this report are preliminary and upon coder review may  be revised to meet current compliance requirements. Midge Minium MD, MD 01/04/2024 9:14:19 AM This report has been signed electronically. Number of Addenda: 0 Note Initiated On: 01/04/2024 8:36 AM Scope Withdrawal Time: 0 hours 13 minutes 49 seconds  Total Procedure Duration: 0 hours 21 minutes 31 seconds  Estimated Blood Loss:  Estimated blood loss: none.      [facility]

## 2024-01-04 NOTE — Anesthesia Preprocedure Evaluation (Addendum)
Anesthesia Evaluation  Patient identified by MRN, date of birth, ID band Patient awake    Reviewed: Allergy & Precautions, NPO status , Patient's Chart, lab work & pertinent test results  History of Anesthesia Complications Negative for: history of anesthetic complications  Airway Mallampati: III  TM Distance: >3 FB Neck ROM: Full    Dental no notable dental hx. (+) Teeth Intact   Pulmonary neg pulmonary ROS, neg sleep apnea, neg COPD, Patient abstained from smoking.Not current smoker   Pulmonary exam normal breath sounds clear to auscultation       Cardiovascular Exercise Tolerance: Good METS: 3 - Mets hypertension, + angina  + CAD, + Past MI, + Cardiac Stents and +CHF  + dysrhythmias Atrial Fibrillation  Rhythm:Regular Rate:Normal - Systolic murmurs  Patient with questionable NSTEMI in 2016.  Documentation varies between providers asked whether or not patient ruled in for cardiovascular event.    Diagnostic left heart catheterization was performed on 09/01/2015 revealing normal left ventricular systolic function with a hyperdynamic LVEF of 70%.  There was multivessel CAD; 80% mid LAD, 50% mid to distal LCx, and 50% distal LCx.  PCI was subsequently performed placing a 2.75 x 15 mm Xience Alpine DES x1 to the mid LAD lesion yielding excellent angiographic result and TIMI-3 flow.    Repeat diagnostic left heart catheterization was performed on 11/14/2017 revealing multivessel CAD; 30% ostial D1-D1, 40% proximal LCx, and 40% mid LAD.  Previously placed stent was widely patent.  Given the nonobstructive nature of her disease, further intervention was deferred opting for medical management.    Patient with a history of cardiomyopathy with resulting HFrEF.  Cardiac function has been serially monitored via TTE since time of diagnosis.  Most recent TTE was performed on 03/02/2022 revealing a normal left ventricular systolic function with an  EF of >55%.  There was mild LVH and left atrial enlargement.  Left ventricular diastolic Doppler parameters consistent with abnormal relaxation (G1DD). There was mild mitral, tricuspid, and pulmonic valve regurgitation.  There was no evidence of a significant transvalvular gradient to suggest stenosis.    Neuro/Psych  PSYCHIATRIC DISORDERS  Depression    negative neurological ROS     GI/Hepatic ,GERD  Controlled,,(+)     (-) substance abuse    Endo/Other  neg diabetesHypothyroidism    Renal/GU negative Renal ROS     Musculoskeletal  (+) Arthritis ,    Abdominal Normal abdominal exam  (+)   Peds  Hematology   Anesthesia Other Findings Past Medical History: No date: Aortic atherosclerosis (HCC) 05/18/2022: Basal cell carcinoma     Comment:  Left inf cheek - EDC 2008: Bladder cancer (HCC) No date: BPH (benign prostatic hyperplasia) No date: Bradycardia No date: Cardiomyopathy [facility])     Comment:  a.) TTE 08/17/2015: EF 55%; b.) LHC 09/01/2015: EF 70%;               c.) LHC 11/14/2017: EF >55%; d.) TTE 11/15/2017: EF 45%;               e.) MPI 08/05/2019: EF 45%; f.) TTE 05/17/2020: EF               25-30%; g.) TTE 07/16/2020: EF >55%; h.) TTE 03/02/2022:               EF >55% No date: Cataract, bilateral No date: Coronary artery disease     Comment:  a.) questionable NSTEMI 2016; b.) LHC/PCI 09/01/2015:  80% mLAD (2.75 x 15 mm Xience Alpine DES), 50% m-dLCx,               50% dLCx; c.) LHC 11/14/2017: 30% oD1-D1, 40% pLCx, 40%               mLAD -- med mgmt No date: Depression No date: GERD (gastroesophageal reflux disease) No date: HFrEF (heart failure with reduced ejection fraction) (HCC)     Comment:  a.) TTE 08/17/2015: EF 55%, anteroseptal HK, triv PR/TR,              G1DD; b.) TTE 11/15/2017: EF 45%, mild LAE, mild MR, mod               RVE; c.) TTE 05/17/2020: EF 25-30%, glob HK, mod LV dil,               mild LVH, mild BAE, mod MR, triv AR; d.) TTE  07/16/2020:               EF >55%, mild LVH, triv MR, mild TR/PR; e.) TTE               03/02/2022: EF >55%, mild LVH, LAE, mild MR/TR/PR, G1DD No date: History of 2019 novel coronavirus disease (COVID-19)     Comment:  a.) 12/2020; b.) 08/04/2022 No date: HLD (hyperlipidemia) No date: Hypertension No date: Hypothyroidism No date: LBBB (left bundle branch block) No date: Long term current use of amiodarone 2016: NSTEMI (non-ST elevated myocardial infarction) (HCC) No date: Osteoarthritis No date: Paroxysmal atrial fibrillation (HCC)     Comment:  a.) CHA2DS2VASc = 5 (age x 2, HFrEF, HTN, previous MI);               b.) rate/rhythm maintained on oral amiodarone; no chronic              anticoagulation (discontinued due to cost, medical               noncompliance, falls) No date: Peripheral edema 12/2020: Pneumonia due to COVID-19 virus No date: Sleep difficulties     Comment:  a.) uses trazodone PRN 01/26/2022: Squamous cell carcinoma of skin     Comment:  L mid dorsum forearm - ED&C  Reproductive/Obstetrics                             Anesthesia Physical Anesthesia Plan  ASA: 3  Anesthesia Plan: General   Post-op Pain Management:    Induction: Intravenous  PONV Risk Score and Plan: 3 and 1 and Propofol infusion and TIVA  Airway Management Planned: Natural Airway  Additional Equipment: None  Intra-op Plan:   Post-operative Plan:   Informed Consent: I have reviewed the patients History and Physical, chart, labs and discussed the procedure including the risks, benefits and alternatives for the proposed anesthesia with the patient or authorized representative who has indicated his/her understanding and acceptance.       Plan Discussed with: CRNA and Surgeon  Anesthesia Plan Comments:        Anesthesia Quick Evaluation

## 2024-01-04 NOTE — Transfer of Care (Signed)
Immediate Anesthesia Transfer of Care Note  Patient: Nathaniel Ibarra  Procedure(s) Performed: COLONOSCOPY WITH PROPOFOL POLYPECTOMY BIOPSY  Patient Location: PACU  Anesthesia Type:General  Level of Consciousness: drowsy  Airway & Oxygen Therapy: Patient Spontanous Breathing  Post-op Assessment: Report given to RN and Post -op Vital signs reviewed and stable  Post vital signs: Reviewed  Last Vitals:  Vitals Value Taken Time  BP    Temp    Pulse    Resp    SpO2      Last Pain:  Vitals:   01/04/24 0809  TempSrc: Temporal  PainSc: 0-No pain         Complications: No notable events documented.

## 2024-01-05 ENCOUNTER — Encounter: Payer: Self-pay | Admitting: Gastroenterology

## 2024-01-05 LAB — SURGICAL PATHOLOGY

## 2024-01-11 ENCOUNTER — Ambulatory Visit: Payer: PPO | Admitting: Dermatology

## 2024-01-11 ENCOUNTER — Encounter: Payer: Self-pay | Admitting: Dermatology

## 2024-01-11 DIAGNOSIS — L820 Inflamed seborrheic keratosis: Secondary | ICD-10-CM

## 2024-01-11 DIAGNOSIS — L814 Other melanin hyperpigmentation: Secondary | ICD-10-CM

## 2024-01-11 DIAGNOSIS — Z872 Personal history of diseases of the skin and subcutaneous tissue: Secondary | ICD-10-CM | POA: Diagnosis not present

## 2024-01-11 DIAGNOSIS — L821 Other seborrheic keratosis: Secondary | ICD-10-CM | POA: Diagnosis not present

## 2024-01-11 DIAGNOSIS — W908XXA Exposure to other nonionizing radiation, initial encounter: Secondary | ICD-10-CM | POA: Diagnosis not present

## 2024-01-11 DIAGNOSIS — Z85828 Personal history of other malignant neoplasm of skin: Secondary | ICD-10-CM

## 2024-01-11 DIAGNOSIS — L578 Other skin changes due to chronic exposure to nonionizing radiation: Secondary | ICD-10-CM

## 2024-01-11 NOTE — Patient Instructions (Addendum)

## 2024-01-11 NOTE — Progress Notes (Signed)
Follow-Up Visit   Subjective  Nathaniel Ibarra is a 83 y.o. male who presents for the following: AK scalp, 76m fu, check spots scalp, L arm all itchy The patient has spots, moles and lesions to be evaluated, some may be new or changing and the patient may have concern these could be cancer.   The following portions of the chart were reviewed this encounter and updated as appropriate: medications, allergies, medical history  Review of Systems:  No other skin or systemic complaints except as noted in HPI or Assessment and Plan.  Objective  Well appearing patient in no apparent distress; mood and affect are within normal limits.   A focused examination was performed of the following areas: Face, scalp, arms  Relevant exam findings are noted in the Assessment and Plan.  Frontal scalp x 1, L occipital scalp x 1, L flexor forearm x 1 (3) Stuck on waxy paps with erythema  Assessment & Plan   HISTORY OF BASAL CELL CARCINOMA OF THE SKIN - No evidence of recurrence today - Recommend regular full body skin exams - Recommend daily broad spectrum sunscreen SPF 30+ to sun-exposed areas, reapply every 2 hours as needed.  - Call if any new or changing lesions are noted between office visits  - L inf cheek  HISTORY OF SQUAMOUS CELL CARCINOMA OF THE SKIN - No evidence of recurrence today - Recommend regular full body skin exams - Recommend daily broad spectrum sunscreen SPF 30+ to sun-exposed areas, reapply every 2 hours as needed.  - Call if any new or changing lesions are noted between office visits - L mid dorsum forearm, L distal dorsum forearm   SEBORRHEIC KERATOSIS - Stuck-on, waxy, tan-brown papules and/or plaques  - Benign-appearing - Discussed benign etiology and prognosis. - Observe - Call for any changes  ACTINIC DAMAGE - chronic, secondary to cumulative UV radiation exposure/sun exposure over time - diffuse scaly erythematous macules with underlying dyspigmentation -  Recommend daily broad spectrum sunscreen SPF 30+ to sun-exposed areas, reapply every 2 hours as needed.  - Recommend staying in the shade or wearing long sleeves, sun glasses (UVA+UVB protection) and wide brim hats (4-inch brim around the entire circumference of the hat). - Call for new or changing lesions.   LENTIGINES Exam: scattered tan macules Due to sun exposure Treatment Plan: Benign-appearing, observe. Recommend daily broad spectrum sunscreen SPF 30+ to sun-exposed areas, reapply every 2 hours as needed.  Call for any changes  HISTORY OF PRECANCEROUS ACTINIC KERATOSIS - site(s) of PreCancerous Actinic Keratosis clear today. - these may recur and new lesions may form requiring treatment to prevent transformation into skin cancer - observe for new or changing spots and contact [facility] for appointment if occur - photoprotection with sun protective clothing; sunglasses and broad spectrum sunscreen with SPF of at least 30 + and frequent self skin exams recommended - yearly exams by a dermatologist recommended for persons with history of PreCancerous Actinic Keratoses  INFLAMED SEBORRHEIC KERATOSIS (3) Frontal scalp x 1, L occipital scalp x 1, L flexor forearm x 1 (3) Symptomatic, irritating, patient would like treated. Destruction of lesion - Frontal scalp x 1, L occipital scalp x 1, L flexor forearm x 1 (3)  Destruction method: cryotherapy   Informed consent: discussed and consent obtained   Lesion destroyed using liquid nitrogen: Yes   Region frozen until ice ball extended beyond lesion: Yes   Outcome: patient tolerated procedure well with no complications   Post-procedure details:  wound care instructions given   Additional details:  Prior to procedure, discussed risks of blister formation, small wound, skin dyspigmentation, or rare scar following cryotherapy. Recommend Vaseline ointment to treated areas while healing.   Return in about 6 months (around 07/10/2024) for  UBSE, Hx of BCC, Hx of SCC, Hx of AKs.  I, Ardis Rowan, RMA, am acting as scribe for Willeen Niece, MD .   Documentation: I have reviewed the above documentation for accuracy and completeness, and I agree with the above.  Willeen Niece, MD

## 2024-01-12 ENCOUNTER — Telehealth: Payer: Self-pay | Admitting: Gastroenterology

## 2024-01-12 NOTE — Telephone Encounter (Signed)
Pt requesting call back to get results of colonoscopy

## 2024-01-21 DIAGNOSIS — R0609 Other forms of dyspnea: Secondary | ICD-10-CM | POA: Diagnosis not present

## 2024-01-21 DIAGNOSIS — R942 Abnormal results of pulmonary function studies: Secondary | ICD-10-CM | POA: Diagnosis not present

## 2024-01-21 DIAGNOSIS — I5022 Chronic systolic (congestive) heart failure: Secondary | ICD-10-CM | POA: Diagnosis not present

## 2024-02-10 IMAGING — US US ABDOMEN COMPLETE
1 series · 15 of 25 positions shown · non-contrast
Comparison: Renal sonogram done on 07/23/2013

CLINICAL DATA: Abdominal distention, edema in the lower extremities

EXAM:
ABDOMEN ULTRASOUND COMPLETE

[Series 1: us abdomen complete · 15 of 189 slices shown]
[im 1/189]
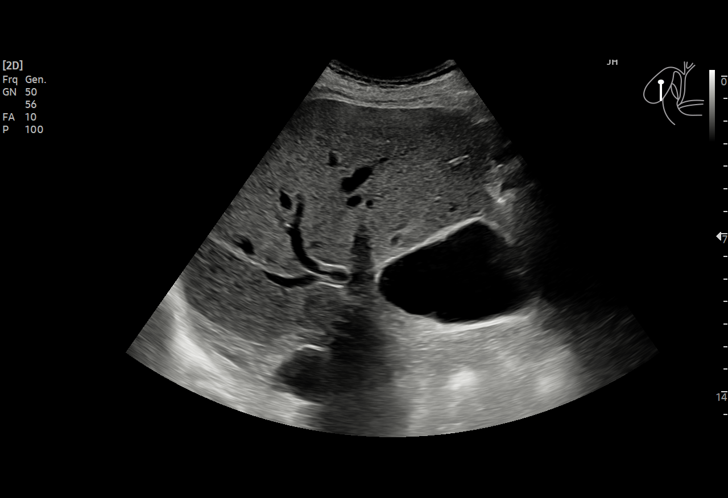
[im 16/189]
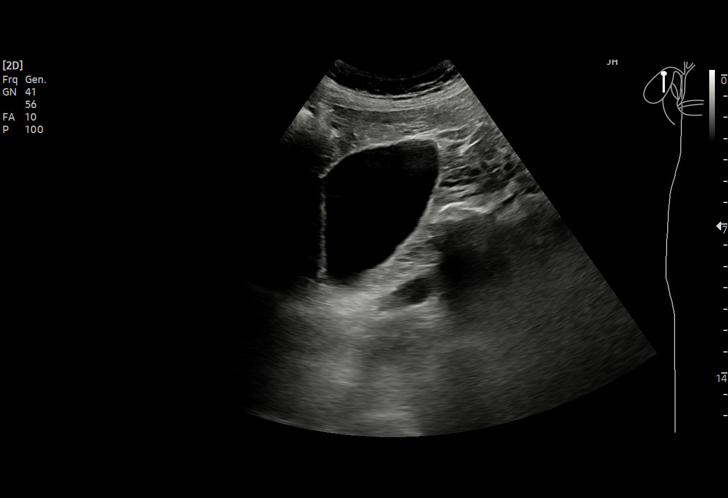
[im 32/189]
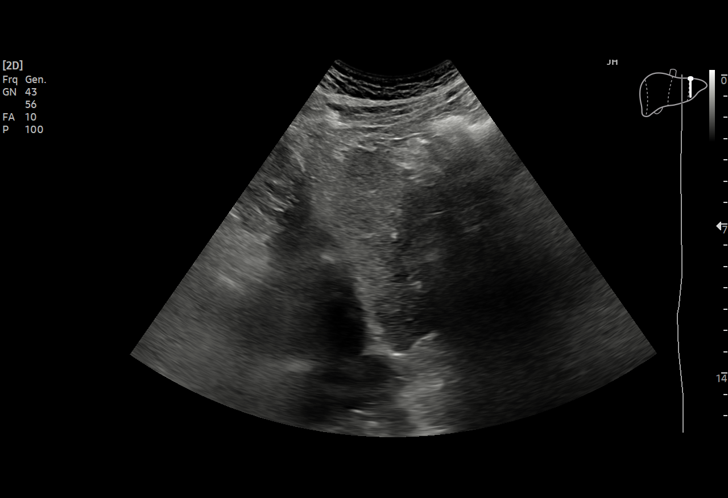
[im 40/189]
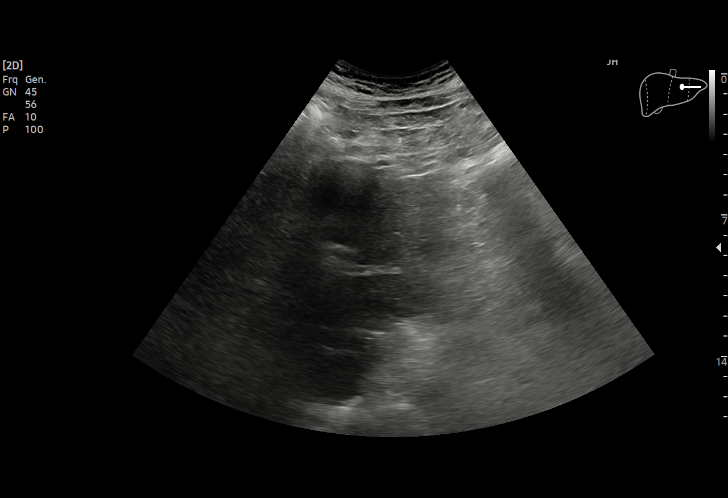
[im 55/189]
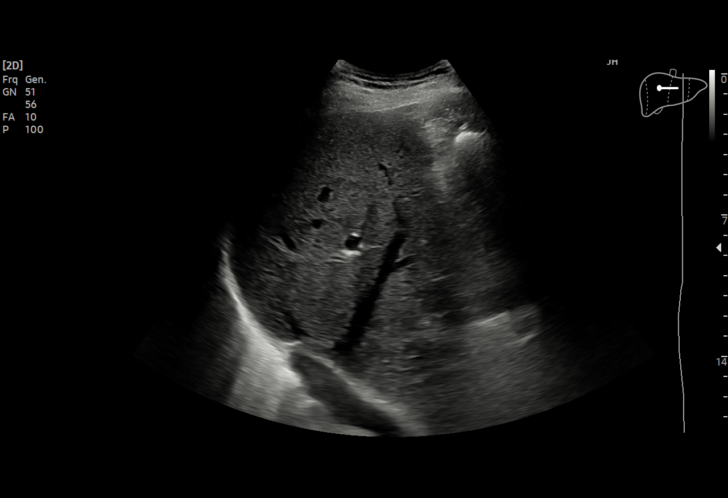
[im 71/189]
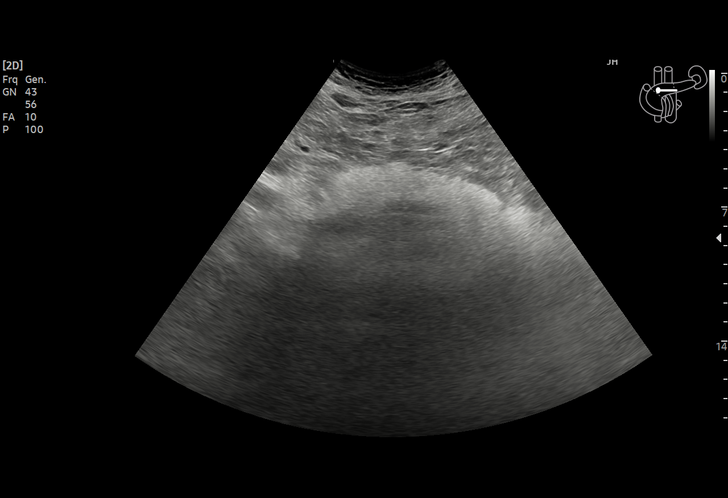
[im 79/189]
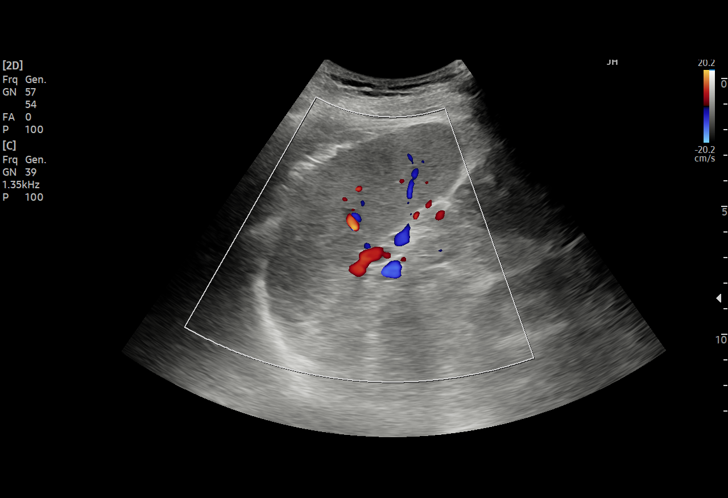
[im 95/189]
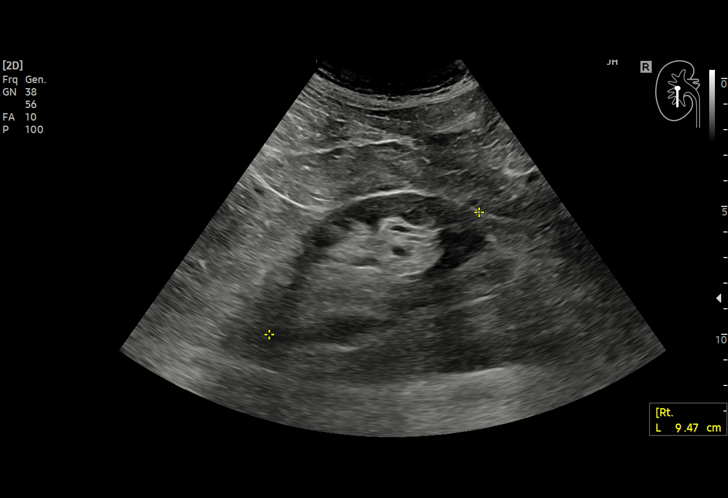
[im 110/189]
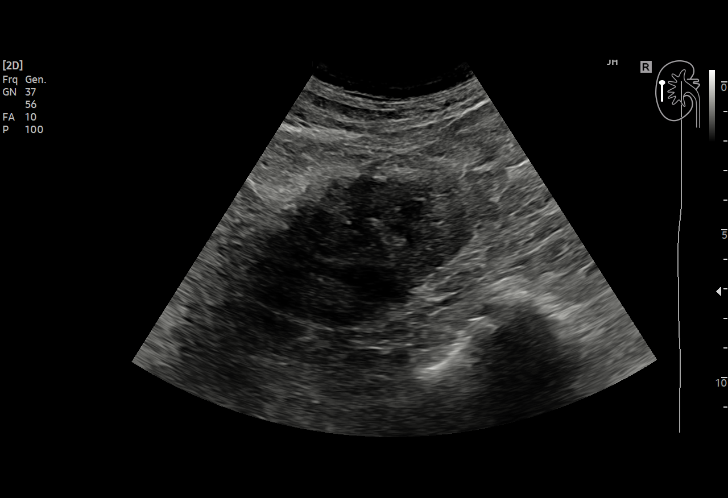
[im 118/189]
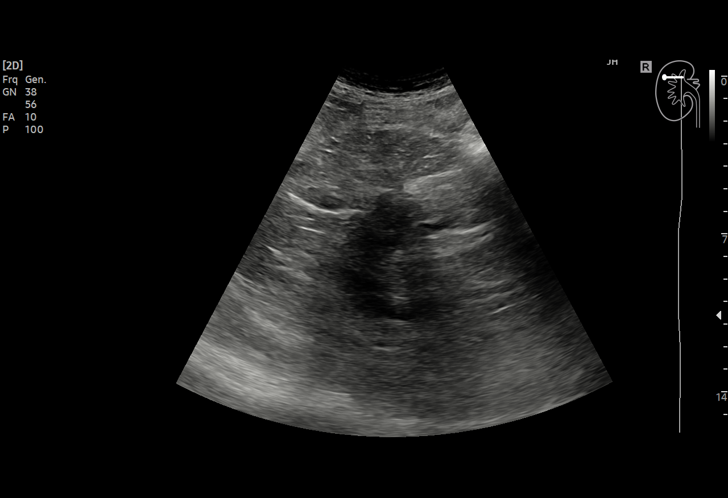
[im 134/189]
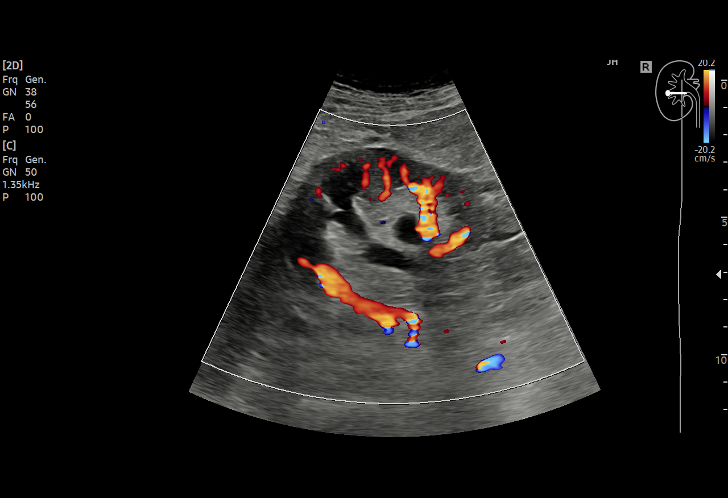
[im 149/189]
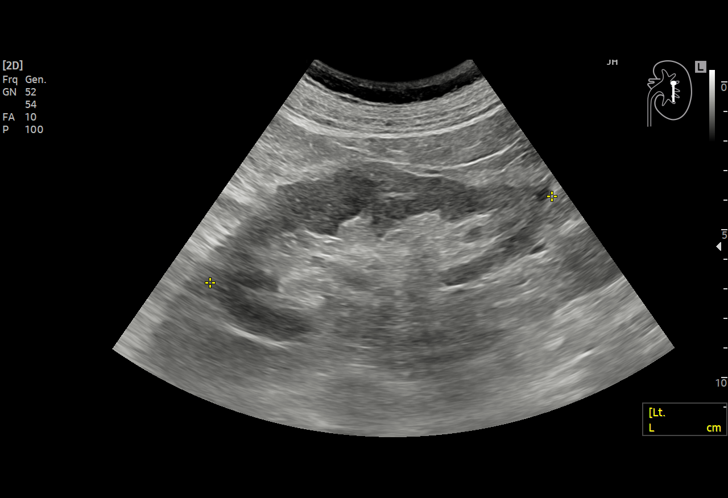
[im 157/189]
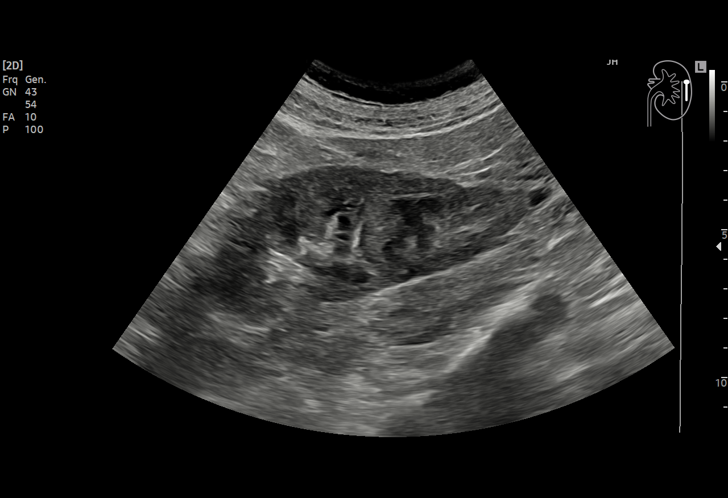
[im 173/189]
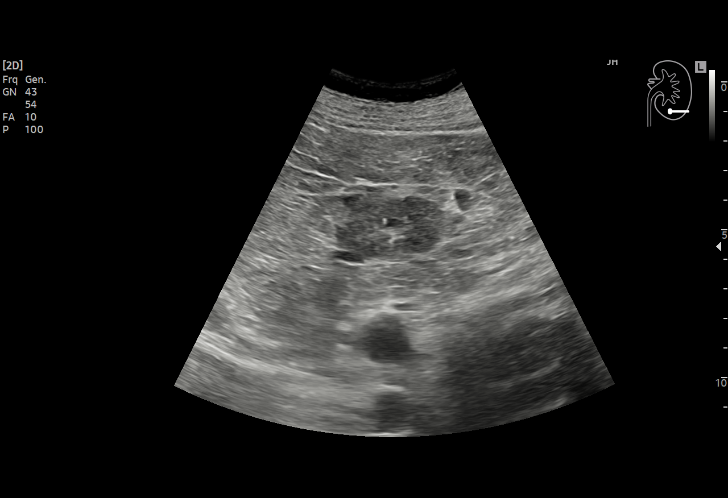
[im 189/189]
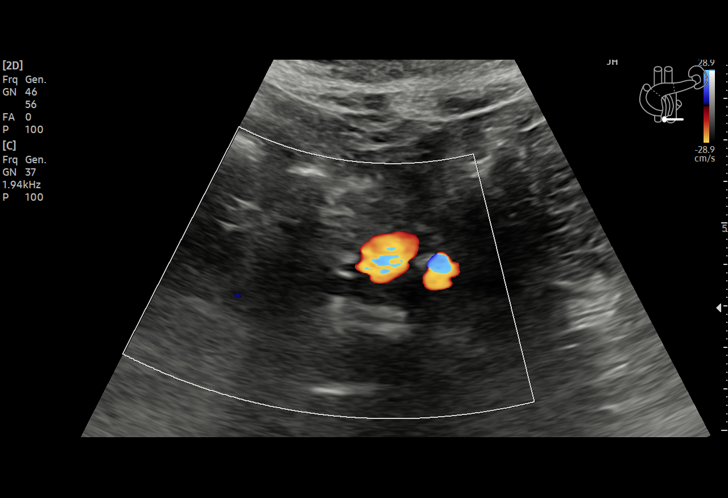

[15 of 25 positions shown; findings below may reference images not displayed]

FINDINGS: Gallbladder: There are 2-3 small echogenic foci in the neck of the
gallbladder. There is no definite demonstrable intraluminal mobility
seen in the submitted images. Each of these echogenic foci measures
proximally 8 mm. There is no significant wall thickening.
Technologist did not observe any tenderness over the gallbladder.
There is no fluid around the gallbladder.

Common bile duct: Diameter: 3.8 mm.

Liver: No focal lesion identified. Within normal limits in
parenchymal echogenicity. Portal vein is patent on color Doppler
imaging with normal direction of blood flow towards the liver.

IVC: No abnormality visualized.

Pancreas: Visualized portion unremarkable.

Spleen: Size and appearance within normal limits.

Right Kidney: Length: 11.9 cm. There is prominence of right renal
pelvis measuring up to 3 cm. There is prominence of infundibula and
minor calices in the right kidney. Cortical echogenicity is
unremarkable.

Left Kidney: Length: 11.9 cm. Echogenicity within normal limits. No
mass or hydronephrosis visualized.

Abdominal aorta: No aneurysm visualized.

Other findings: None.
IMPRESSION: There is mild to moderate right hydronephrosis. If clinically
warranted, CT of abdomen and pelvis may be considered for further
evaluation.

There are few echogenic foci in the neck of the gallbladder
suggesting gallbladder stones. There are no signs of acute
cholecystitis.

## 2024-02-16 ENCOUNTER — Other Ambulatory Visit: Payer: Self-pay | Admitting: Internal Medicine

## 2024-02-16 DIAGNOSIS — E039 Hypothyroidism, unspecified: Secondary | ICD-10-CM

## 2024-02-18 DIAGNOSIS — R0609 Other forms of dyspnea: Secondary | ICD-10-CM | POA: Diagnosis not present

## 2024-02-18 DIAGNOSIS — R942 Abnormal results of pulmonary function studies: Secondary | ICD-10-CM | POA: Diagnosis not present

## 2024-02-18 DIAGNOSIS — I5022 Chronic systolic (congestive) heart failure: Secondary | ICD-10-CM | POA: Diagnosis not present

## 2024-03-03 DIAGNOSIS — R051 Acute cough: Secondary | ICD-10-CM | POA: Diagnosis not present

## 2024-03-03 DIAGNOSIS — J101 Influenza due to other identified influenza virus with other respiratory manifestations: Secondary | ICD-10-CM | POA: Diagnosis not present

## 2024-03-03 DIAGNOSIS — Z03818 Encounter for observation for suspected exposure to other biological agents ruled out: Secondary | ICD-10-CM | POA: Diagnosis not present

## 2024-03-03 DIAGNOSIS — J189 Pneumonia, unspecified organism: Secondary | ICD-10-CM | POA: Diagnosis not present

## 2024-03-10 ENCOUNTER — Inpatient Hospital Stay
Admission: EM | Admit: 2024-03-10 | Discharge: 2024-04-18 | DRG: 207 | Disposition: E | Attending: Internal Medicine | Admitting: Internal Medicine

## 2024-03-10 ENCOUNTER — Other Ambulatory Visit: Payer: Self-pay

## 2024-03-10 ENCOUNTER — Emergency Department

## 2024-03-10 DIAGNOSIS — Z66 Do not resuscitate: Secondary | ICD-10-CM | POA: Diagnosis not present

## 2024-03-10 DIAGNOSIS — R0689 Other abnormalities of breathing: Secondary | ICD-10-CM | POA: Diagnosis not present

## 2024-03-10 DIAGNOSIS — R9389 Abnormal findings on diagnostic imaging of other specified body structures: Secondary | ICD-10-CM | POA: Diagnosis not present

## 2024-03-10 DIAGNOSIS — I252 Old myocardial infarction: Secondary | ICD-10-CM

## 2024-03-10 DIAGNOSIS — I5043 Acute on chronic combined systolic (congestive) and diastolic (congestive) heart failure: Secondary | ICD-10-CM | POA: Diagnosis not present

## 2024-03-10 DIAGNOSIS — Z6826 Body mass index (BMI) 26.0-26.9, adult: Secondary | ICD-10-CM

## 2024-03-10 DIAGNOSIS — N400 Enlarged prostate without lower urinary tract symptoms: Secondary | ICD-10-CM | POA: Diagnosis present

## 2024-03-10 DIAGNOSIS — J9602 Acute respiratory failure with hypercapnia: Secondary | ICD-10-CM | POA: Diagnosis not present

## 2024-03-10 DIAGNOSIS — J80 Acute respiratory distress syndrome: Secondary | ICD-10-CM | POA: Diagnosis present

## 2024-03-10 DIAGNOSIS — I429 Cardiomyopathy, unspecified: Secondary | ICD-10-CM | POA: Diagnosis not present

## 2024-03-10 DIAGNOSIS — Z9842 Cataract extraction status, left eye: Secondary | ICD-10-CM

## 2024-03-10 DIAGNOSIS — J439 Emphysema, unspecified: Secondary | ICD-10-CM | POA: Diagnosis not present

## 2024-03-10 DIAGNOSIS — I480 Paroxysmal atrial fibrillation: Secondary | ICD-10-CM | POA: Diagnosis present

## 2024-03-10 DIAGNOSIS — J9621 Acute and chronic respiratory failure with hypoxia: Secondary | ICD-10-CM | POA: Diagnosis not present

## 2024-03-10 DIAGNOSIS — J101 Influenza due to other identified influenza virus with other respiratory manifestations: Principal | ICD-10-CM

## 2024-03-10 DIAGNOSIS — E785 Hyperlipidemia, unspecified: Secondary | ICD-10-CM | POA: Diagnosis not present

## 2024-03-10 DIAGNOSIS — Z8701 Personal history of pneumonia (recurrent): Secondary | ICD-10-CM

## 2024-03-10 DIAGNOSIS — J440 Chronic obstructive pulmonary disease with acute lower respiratory infection: Secondary | ICD-10-CM | POA: Diagnosis present

## 2024-03-10 DIAGNOSIS — R0603 Acute respiratory distress: Secondary | ICD-10-CM | POA: Diagnosis not present

## 2024-03-10 DIAGNOSIS — J1001 Influenza due to other identified influenza virus with the same other identified influenza virus pneumonia: Secondary | ICD-10-CM | POA: Diagnosis not present

## 2024-03-10 DIAGNOSIS — K219 Gastro-esophageal reflux disease without esophagitis: Secondary | ICD-10-CM | POA: Diagnosis present

## 2024-03-10 DIAGNOSIS — I2510 Atherosclerotic heart disease of native coronary artery without angina pectoris: Secondary | ICD-10-CM | POA: Diagnosis not present

## 2024-03-10 DIAGNOSIS — Z8616 Personal history of COVID-19: Secondary | ICD-10-CM | POA: Diagnosis not present

## 2024-03-10 DIAGNOSIS — Z9089 Acquired absence of other organs: Secondary | ICD-10-CM

## 2024-03-10 DIAGNOSIS — I959 Hypotension, unspecified: Secondary | ICD-10-CM | POA: Diagnosis not present

## 2024-03-10 DIAGNOSIS — Z85828 Personal history of other malignant neoplasm of skin: Secondary | ICD-10-CM

## 2024-03-10 DIAGNOSIS — Z96652 Presence of left artificial knee joint: Secondary | ICD-10-CM | POA: Diagnosis present

## 2024-03-10 DIAGNOSIS — G928 Other toxic encephalopathy: Secondary | ICD-10-CM | POA: Diagnosis not present

## 2024-03-10 DIAGNOSIS — I5022 Chronic systolic (congestive) heart failure: Secondary | ICD-10-CM | POA: Diagnosis not present

## 2024-03-10 DIAGNOSIS — I5030 Unspecified diastolic (congestive) heart failure: Secondary | ICD-10-CM | POA: Diagnosis not present

## 2024-03-10 DIAGNOSIS — J1008 Influenza due to other identified influenza virus with other specified pneumonia: Secondary | ICD-10-CM | POA: Diagnosis present

## 2024-03-10 DIAGNOSIS — I110 Hypertensive heart disease with heart failure: Secondary | ICD-10-CM | POA: Diagnosis not present

## 2024-03-10 DIAGNOSIS — J9601 Acute respiratory failure with hypoxia: Principal | ICD-10-CM

## 2024-03-10 DIAGNOSIS — L8990 Pressure ulcer of unspecified site, unspecified stage: Secondary | ICD-10-CM | POA: Diagnosis not present

## 2024-03-10 DIAGNOSIS — J09X1 Influenza due to identified novel influenza A virus with pneumonia: Secondary | ICD-10-CM | POA: Diagnosis not present

## 2024-03-10 DIAGNOSIS — J9690 Respiratory failure, unspecified, unspecified whether with hypoxia or hypercapnia: Secondary | ICD-10-CM | POA: Diagnosis not present

## 2024-03-10 DIAGNOSIS — I468 Cardiac arrest due to other underlying condition: Secondary | ICD-10-CM | POA: Diagnosis present

## 2024-03-10 DIAGNOSIS — R001 Bradycardia, unspecified: Secondary | ICD-10-CM | POA: Diagnosis present

## 2024-03-10 DIAGNOSIS — Z860100 Personal history of colon polyps, unspecified: Secondary | ICD-10-CM

## 2024-03-10 DIAGNOSIS — I700 Atherosclerosis of aorta: Secondary | ICD-10-CM | POA: Diagnosis present

## 2024-03-10 DIAGNOSIS — E440 Moderate protein-calorie malnutrition: Secondary | ICD-10-CM | POA: Diagnosis present

## 2024-03-10 DIAGNOSIS — M1990 Unspecified osteoarthritis, unspecified site: Secondary | ICD-10-CM | POA: Diagnosis present

## 2024-03-10 DIAGNOSIS — J189 Pneumonia, unspecified organism: Secondary | ICD-10-CM | POA: Diagnosis not present

## 2024-03-10 DIAGNOSIS — J40 Bronchitis, not specified as acute or chronic: Secondary | ICD-10-CM | POA: Diagnosis not present

## 2024-03-10 DIAGNOSIS — Z9889 Other specified postprocedural states: Secondary | ICD-10-CM

## 2024-03-10 DIAGNOSIS — Z9841 Cataract extraction status, right eye: Secondary | ICD-10-CM

## 2024-03-10 DIAGNOSIS — Z825 Family history of asthma and other chronic lower respiratory diseases: Secondary | ICD-10-CM

## 2024-03-10 DIAGNOSIS — J159 Unspecified bacterial pneumonia: Secondary | ICD-10-CM | POA: Diagnosis not present

## 2024-03-10 DIAGNOSIS — Z79899 Other long term (current) drug therapy: Secondary | ICD-10-CM

## 2024-03-10 DIAGNOSIS — R140 Abdominal distension (gaseous): Secondary | ICD-10-CM | POA: Diagnosis not present

## 2024-03-10 DIAGNOSIS — J90 Pleural effusion, not elsewhere classified: Secondary | ICD-10-CM | POA: Diagnosis not present

## 2024-03-10 DIAGNOSIS — Z515 Encounter for palliative care: Secondary | ICD-10-CM

## 2024-03-10 DIAGNOSIS — R0902 Hypoxemia: Secondary | ICD-10-CM | POA: Diagnosis not present

## 2024-03-10 DIAGNOSIS — Z961 Presence of intraocular lens: Secondary | ICD-10-CM | POA: Diagnosis present

## 2024-03-10 DIAGNOSIS — R918 Other nonspecific abnormal finding of lung field: Secondary | ICD-10-CM | POA: Diagnosis not present

## 2024-03-10 DIAGNOSIS — R0989 Other specified symptoms and signs involving the circulatory and respiratory systems: Secondary | ICD-10-CM | POA: Diagnosis not present

## 2024-03-10 DIAGNOSIS — I517 Cardiomegaly: Secondary | ICD-10-CM | POA: Diagnosis not present

## 2024-03-10 DIAGNOSIS — E875 Hyperkalemia: Secondary | ICD-10-CM | POA: Diagnosis not present

## 2024-03-10 DIAGNOSIS — R0609 Other forms of dyspnea: Secondary | ICD-10-CM | POA: Diagnosis not present

## 2024-03-10 DIAGNOSIS — I5023 Acute on chronic systolic (congestive) heart failure: Secondary | ICD-10-CM | POA: Diagnosis not present

## 2024-03-10 DIAGNOSIS — F32A Depression, unspecified: Secondary | ICD-10-CM | POA: Diagnosis not present

## 2024-03-10 DIAGNOSIS — T797XXA Traumatic subcutaneous emphysema, initial encounter: Secondary | ICD-10-CM | POA: Diagnosis not present

## 2024-03-10 DIAGNOSIS — J09X2 Influenza due to identified novel influenza A virus with other respiratory manifestations: Secondary | ICD-10-CM | POA: Diagnosis not present

## 2024-03-10 DIAGNOSIS — J9622 Acute and chronic respiratory failure with hypercapnia: Secondary | ICD-10-CM | POA: Diagnosis not present

## 2024-03-10 DIAGNOSIS — G9341 Metabolic encephalopathy: Secondary | ICD-10-CM | POA: Diagnosis not present

## 2024-03-10 DIAGNOSIS — E039 Hypothyroidism, unspecified: Secondary | ICD-10-CM | POA: Diagnosis not present

## 2024-03-10 DIAGNOSIS — Z9981 Dependence on supplemental oxygen: Secondary | ICD-10-CM | POA: Diagnosis not present

## 2024-03-10 DIAGNOSIS — Z79890 Hormone replacement therapy (postmenopausal): Secondary | ICD-10-CM

## 2024-03-10 DIAGNOSIS — J8410 Pulmonary fibrosis, unspecified: Secondary | ICD-10-CM | POA: Diagnosis not present

## 2024-03-10 DIAGNOSIS — Z955 Presence of coronary angioplasty implant and graft: Secondary | ICD-10-CM

## 2024-03-10 DIAGNOSIS — I447 Left bundle-branch block, unspecified: Secondary | ICD-10-CM | POA: Diagnosis present

## 2024-03-10 DIAGNOSIS — J111 Influenza due to unidentified influenza virus with other respiratory manifestations: Secondary | ICD-10-CM | POA: Diagnosis not present

## 2024-03-10 DIAGNOSIS — G479 Sleep disorder, unspecified: Secondary | ICD-10-CM | POA: Diagnosis present

## 2024-03-10 DIAGNOSIS — Z9079 Acquired absence of other genital organ(s): Secondary | ICD-10-CM

## 2024-03-10 DIAGNOSIS — J982 Interstitial emphysema: Secondary | ICD-10-CM | POA: Diagnosis not present

## 2024-03-10 DIAGNOSIS — Z7189 Other specified counseling: Secondary | ICD-10-CM | POA: Diagnosis not present

## 2024-03-10 DIAGNOSIS — R942 Abnormal results of pulmonary function studies: Secondary | ICD-10-CM | POA: Diagnosis not present

## 2024-03-10 DIAGNOSIS — J984 Other disorders of lung: Secondary | ICD-10-CM | POA: Diagnosis present

## 2024-03-10 DIAGNOSIS — J9691 Respiratory failure, unspecified with hypoxia: Secondary | ICD-10-CM | POA: Diagnosis not present

## 2024-03-10 DIAGNOSIS — Z8619 Personal history of other infectious and parasitic diseases: Secondary | ICD-10-CM

## 2024-03-10 DIAGNOSIS — Z4682 Encounter for fitting and adjustment of non-vascular catheter: Secondary | ICD-10-CM | POA: Diagnosis not present

## 2024-03-10 DIAGNOSIS — R062 Wheezing: Secondary | ICD-10-CM | POA: Diagnosis not present

## 2024-03-10 DIAGNOSIS — Z8551 Personal history of malignant neoplasm of bladder: Secondary | ICD-10-CM

## 2024-03-10 DIAGNOSIS — J9620 Acute and chronic respiratory failure, unspecified whether with hypoxia or hypercapnia: Secondary | ICD-10-CM | POA: Diagnosis not present

## 2024-03-10 DIAGNOSIS — R000 Tachycardia, unspecified: Secondary | ICD-10-CM | POA: Diagnosis not present

## 2024-03-10 LAB — CBC WITH DIFFERENTIAL/PLATELET
Abs Immature Granulocytes: 0.26 10*3/uL — ABNORMAL HIGH (ref 0.00–0.07)
Basophils Absolute: 0.1 10*3/uL (ref 0.0–0.1)
Basophils Relative: 1 %
Eosinophils Absolute: 0 10*3/uL (ref 0.0–0.5)
Eosinophils Relative: 0 %
HCT: 40.6 % (ref 39.0–52.0)
Hemoglobin: 13.6 g/dL (ref 13.0–17.0)
Immature Granulocytes: 3 %
Lymphocytes Relative: 8 %
Lymphs Abs: 0.8 10*3/uL (ref 0.7–4.0)
MCH: 32.1 pg (ref 26.0–34.0)
MCHC: 33.5 g/dL (ref 30.0–36.0)
MCV: 95.8 fL (ref 80.0–100.0)
Monocytes Absolute: 0.6 10*3/uL (ref 0.1–1.0)
Monocytes Relative: 6 %
Neutro Abs: 8.3 10*3/uL — ABNORMAL HIGH (ref 1.7–7.7)
Neutrophils Relative %: 82 %
Platelets: 286 10*3/uL (ref 150–400)
RBC: 4.24 MIL/uL (ref 4.22–5.81)
RDW: 12.8 % (ref 11.5–15.5)
WBC: 10 10*3/uL (ref 4.0–10.5)
nRBC: 0 % (ref 0.0–0.2)

## 2024-03-10 LAB — BLOOD GAS, VENOUS
Acid-Base Excess: 6 mmol/L — ABNORMAL HIGH (ref 0.0–2.0)
Bicarbonate: 32.2 mmol/L — ABNORMAL HIGH (ref 20.0–28.0)
O2 Saturation: 76.6 %
Patient temperature: 37
pCO2, Ven: 52 mmHg (ref 44–60)
pH, Ven: 7.4 (ref 7.25–7.43)
pO2, Ven: 46 mmHg — ABNORMAL HIGH (ref 32–45)

## 2024-03-10 LAB — BASIC METABOLIC PANEL
Anion gap: 8 (ref 5–15)
BUN: 35 mg/dL — ABNORMAL HIGH (ref 8–23)
CO2: 28 mmol/L (ref 22–32)
Calcium: 8.3 mg/dL — ABNORMAL LOW (ref 8.9–10.3)
Chloride: 101 mmol/L (ref 98–111)
Creatinine, Ser: 0.87 mg/dL (ref 0.61–1.24)
GFR, Estimated: >60 mL/min (ref >60)
Glucose, Bld: 151 mg/dL — ABNORMAL HIGH (ref 70–99)
Potassium: 3.8 mmol/L (ref 3.5–5.1)
Sodium: 137 mmol/L (ref 135–145)

## 2024-03-10 LAB — RESP PANEL BY RT-PCR (RSV, FLU A&B, COVID)  RVPGX2
Influenza A by PCR: POSITIVE — AB
Influenza B by PCR: NEGATIVE
Resp Syncytial Virus by PCR: NEGATIVE
SARS Coronavirus 2 by RT PCR: NEGATIVE

## 2024-03-10 LAB — PROCALCITONIN: Procalcitonin: 0.1 ng/mL

## 2024-03-10 LAB — BRAIN NATRIURETIC PEPTIDE: B Natriuretic Peptide: 220.3 pg/mL — ABNORMAL HIGH (ref 0.0–100.0)

## 2024-03-10 MED ORDER — TAMSULOSIN HCL 0.4 MG PO CAPS
0.8000 mg | ORAL_CAPSULE | Freq: Every day | ORAL | Status: DC
  Administered 2024-03-11: 0.8 mg via ORAL
  Filled 2024-03-10 (×2): qty 2

## 2024-03-10 MED ORDER — ONDANSETRON HCL 4 MG/2ML IJ SOLN: 4.0000 mg | Freq: Four times a day (QID) | INTRAMUSCULAR | Status: DC | PRN

## 2024-03-10 MED ORDER — SODIUM CHLORIDE 0.9 % IV SOLN: INTRAVENOUS | Status: AC

## 2024-03-10 MED ORDER — IPRATROPIUM-ALBUTEROL 0.5-2.5 (3) MG/3ML IN SOLN
3.0000 mL | Freq: Four times a day (QID) | RESPIRATORY_TRACT | Status: DC
  Administered 2024-03-11 – 2024-03-12 (×5): 3 mL via RESPIRATORY_TRACT
  Filled 2024-03-10 (×5): qty 3

## 2024-03-10 MED ORDER — IPRATROPIUM-ALBUTEROL 0.5-2.5 (3) MG/3ML IN SOLN
3.0000 mL | RESPIRATORY_TRACT | Status: DC | PRN
  Administered 2024-03-12 – 2024-03-13 (×2): 3 mL via RESPIRATORY_TRACT
  Filled 2024-03-10 (×2): qty 3

## 2024-03-10 MED ORDER — ACETAMINOPHEN 650 MG RE SUPP: 650.0000 mg | Freq: Four times a day (QID) | RECTAL | Status: DC | PRN

## 2024-03-10 MED ORDER — LEVOTHYROXINE SODIUM 100 MCG PO TABS
100.0000 ug | ORAL_TABLET | Freq: Every day | ORAL | Status: DC
  Administered 2024-03-11 – 2024-03-13 (×2): 100 ug via ORAL
  Filled 2024-03-10: qty 2
  Filled 2024-03-10: qty 1

## 2024-03-10 MED ORDER — FINASTERIDE 5 MG PO TABS
5.0000 mg | ORAL_TABLET | Freq: Every day | ORAL | Status: DC
  Administered 2024-03-11 – 2024-03-13 (×2): 5 mg via ORAL
  Filled 2024-03-10 (×7): qty 1

## 2024-03-10 MED ORDER — AMIODARONE HCL 200 MG PO TABS
200.0000 mg | ORAL_TABLET | Freq: Every day | ORAL | Status: DC
  Administered 2024-03-11: 200 mg via ORAL
  Filled 2024-03-10: qty 1

## 2024-03-10 MED ORDER — IPRATROPIUM-ALBUTEROL 0.5-2.5 (3) MG/3ML IN SOLN
3.0000 mL | Freq: Once | RESPIRATORY_TRACT | Status: AC
  Administered 2024-03-10: 3 mL via RESPIRATORY_TRACT
  Filled 2024-03-10: qty 3

## 2024-03-10 MED ORDER — SPIRONOLACTONE 12.5 MG HALF TABLET
12.5000 mg | ORAL_TABLET | Freq: Every day | ORAL | Status: DC
  Administered 2024-03-11: 12.5 mg via ORAL
  Filled 2024-03-10 (×2): qty 1

## 2024-03-10 MED ORDER — ONDANSETRON HCL 4 MG PO TABS: 4.0000 mg | ORAL_TABLET | Freq: Four times a day (QID) | ORAL | Status: DC | PRN

## 2024-03-10 MED ORDER — ENOXAPARIN SODIUM 40 MG/0.4ML IJ SOSY
40.0000 mg | PREFILLED_SYRINGE | INTRAMUSCULAR | Status: DC
  Administered 2024-03-11 – 2024-03-23 (×13): 40 mg via SUBCUTANEOUS
  Filled 2024-03-10 (×13): qty 0.4

## 2024-03-10 MED ORDER — PAROXETINE HCL 20 MG PO TABS
20.0000 mg | ORAL_TABLET | Freq: Every day | ORAL | Status: DC
  Administered 2024-03-11: 20 mg via ORAL
  Filled 2024-03-10 (×2): qty 1

## 2024-03-10 MED ORDER — SODIUM CHLORIDE 0.9 % IV BOLUS
1000.0000 mL | Freq: Once | INTRAVENOUS | Status: AC
  Administered 2024-03-10: 1000 mL via INTRAVENOUS

## 2024-03-10 MED ORDER — FAMOTIDINE 20 MG PO TABS
20.0000 mg | ORAL_TABLET | Freq: Two times a day (BID) | ORAL | Status: DC
  Administered 2024-03-11 (×2): 20 mg via ORAL
  Filled 2024-03-10 (×2): qty 1

## 2024-03-10 MED ORDER — CARIPRAZINE HCL 1.5 MG PO CAPS
1.5000 mg | ORAL_CAPSULE | Freq: Every day | ORAL | Status: DC
  Administered 2024-03-11 – 2024-03-13 (×2): 1.5 mg via ORAL
  Filled 2024-03-10 (×3): qty 1

## 2024-03-10 MED ORDER — ROSUVASTATIN CALCIUM 10 MG PO TABS
10.0000 mg | ORAL_TABLET | Freq: Every day | ORAL | Status: DC
  Administered 2024-03-11: 10 mg via ORAL
  Filled 2024-03-10: qty 1

## 2024-03-10 MED ORDER — CARIPRAZINE HCL 1.5 MG PO CAPS
1.5000 mg | ORAL_CAPSULE | Freq: Every day | ORAL | Status: DC
Start: 2024-03-11 — End: 2024-03-10

## 2024-03-10 MED ORDER — SODIUM CHLORIDE 0.9 % IV SOLN
500.0000 mg | INTRAVENOUS | Status: AC
  Administered 2024-03-11 – 2024-03-14 (×5): 500 mg via INTRAVENOUS
  Filled 2024-03-10 (×6): qty 5

## 2024-03-10 MED ORDER — TRAZODONE HCL 50 MG PO TABS: 25.0000 mg | ORAL_TABLET | Freq: Every evening | ORAL | Status: DC | PRN

## 2024-03-10 MED ORDER — SODIUM CHLORIDE 0.9 % IV SOLN
2.0000 g | INTRAVENOUS | Status: AC
  Administered 2024-03-10 – 2024-03-14 (×5): 2 g via INTRAVENOUS
  Filled 2024-03-10 (×5): qty 20

## 2024-03-10 MED ORDER — MAGNESIUM HYDROXIDE 400 MG/5ML PO SUSP: 30.0000 mL | Freq: Every day | ORAL | Status: DC | PRN

## 2024-03-10 MED ORDER — HYDROCOD POLI-CHLORPHE POLI ER 10-8 MG/5ML PO SUER: 5.0000 mL | Freq: Two times a day (BID) | ORAL | Status: DC | PRN

## 2024-03-10 MED ORDER — ACETAMINOPHEN 325 MG PO TABS: 650.0000 mg | ORAL_TABLET | Freq: Four times a day (QID) | ORAL | Status: DC | PRN

## 2024-03-10 MED ORDER — GUAIFENESIN ER 600 MG PO TB12
600.0000 mg | ORAL_TABLET | Freq: Two times a day (BID) | ORAL | Status: DC
  Administered 2024-03-10 – 2024-03-11 (×3): 600 mg via ORAL
  Filled 2024-03-10 (×3): qty 1

## 2024-03-10 NOTE — ED Notes (Signed)
Pt given a cup of ice cream.

## 2024-03-10 NOTE — Progress Notes (Signed)
 Patient emergency traffic per ems. O2 sats low. Placed on high flow cannula per Dr Anner Crete. Tolerated well.

## 2024-03-10 NOTE — H&P (Signed)
 [place]   PATIENT NAME: Nathaniel Ibarra    MR#:  [id number]  DATE OF BIRTH:  1941-01-05  DATE OF ADMISSION:  03/10/2024  PRIMARY CARE PHYSICIAN: Sherron Monday, MD   Patient is coming from: Home  REQUESTING/REFERRING PHYSICIAN: Claudell Kyle, MD  CHIEF COMPLAINT:   Chief Complaint  Patient presents with   Shortness of Breath    HISTORY OF PRESENT ILLNESS:  DETRIC SCALISI is a 83 y.o. Caucasian male with medical history significant for HFrEF, GERD, cardiomyopathy, BPH, hypertension, dyslipidemia, hypothyroidism and coronary artery disease, who presented to the emergency room with acute onset of worsening dyspnea over the last week with associated mild dry cough as well as wheezing.  He denied any fever or chills.  He was diagnosed with influenza and was given p.o. Tamiflu that he finished.  Did not any chest pain or palpitations.  No nausea or vomiting or abdominal pain.  No dysuria, oliguria or hematuria or flank pain.  ED Course: When the patient came to the ER, temperature was 97.5 with respiratory to 3322 and pulse symmetry of 97% on 8 L of O2 by high flow.  Labs revealed VBG with pH 7.4 and HCO3 of 32.2.  BMP revealed a BUN of 35 and glucose of 151.  BNP was 220.3.  Procalcitonin 0.1.  CBC was within normal.  Respiratory panel positive for influenza A. EKG as reviewed by me : EKG showed normal sinus rhythm with rate of 66 with PVCs and short PR interval with poor rate progression and left bundle branch block. Imaging: Portable chest x-ray showed low lung volumes with bronchitic changes and questionable developing right midlung zone and left mid to lower lung zone airspace opacities.  It showed aortic atherosclerosis.  The patient was given 1 L bolus of IV normal saline and DuoNebs.  He will be admitted to a medical telemetry bed for further evaluation and management. PAST MEDICAL HISTORY:   Past Medical History:  Diagnosis Date   Aortic atherosclerosis (HCC)     Basal cell carcinoma 05/18/2022   Left inf cheek - EDC   Bladder cancer (HCC) 2008   BPH (benign prostatic hyperplasia)    Bradycardia    Cardiomyopathy (HCC)    a.) TTE 08/17/2015: EF 55%; b.) LHC 09/01/2015: EF 70%; c.) LHC 11/14/2017: EF >55%; d.) TTE 11/15/2017: EF 45%; e.) MPI 08/05/2019: EF 45%; f.) TTE 05/17/2020: EF 25-30%; g.) TTE 07/16/2020: EF >55%; h.) TTE 03/02/2022: EF >55%   Cataract, bilateral    Coronary artery disease    a.) questionable NSTEMI 2016; b.) LHC/PCI 09/01/2015: 80% mLAD (2.75 x 15 mm Xience Alpine DES), 50% m-dLCx, 50% dLCx; c.) LHC 11/14/2017: 30% oD1-D1, 40% pLCx, 40% mLAD -- med mgmt   Depression    GERD (gastroesophageal reflux disease)    HFrEF (heart failure with reduced ejection fraction) (HCC)    a.) TTE 08/17/2015: EF 55%, anteroseptal HK, triv PR/TR, G1DD; b.) TTE 11/15/2017: EF 45%, mild LAE, mild MR, mod RVE; c.) TTE 05/17/2020: EF 25-30%, glob HK, mod LV dil, mild LVH, mild BAE, mod MR, triv AR; d.) TTE 07/16/2020: EF >55%, mild LVH, triv MR, mild TR/PR; e.) TTE 03/02/2022: EF >55%, mild LVH, LAE, mild MR/TR/PR, G1DD   History of 2019 novel coronavirus disease (COVID-19)    a.) 12/2020; b.) 08/04/2022   HLD (hyperlipidemia)    Hypertension    Hypothyroidism    LBBB (left bundle branch block)    Long term current use  of amiodarone    NSTEMI (non-ST elevated myocardial infarction) (HCC) 2016   Osteoarthritis    Paroxysmal atrial fibrillation (HCC)    a.) CHA2DS2VASc = 5 (age x 2, HFrEF, HTN, previous MI);  b.) rate/rhythm maintained on oral amiodarone; no chronic anticoagulation (discontinued due to cost, medical noncompliance, falls)   Peripheral edema    Pneumonia due to COVID-19 virus 12/2020   Sleep difficulties    a.) uses trazodone PRN   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   L mid dorsum forearm - ED&C   Squamous cell carcinoma of skin 05/17/2023   Left distal dorsum forearm, EDC    PAST SURGICAL HISTORY:   Past Surgical  History:  Procedure Laterality Date   BIOPSY  01/04/2024   Procedure: BIOPSY;  Surgeon: Midge Minium, MD;  Location: [facility] ENDOSCOPY;  Service: Endoscopy;;   CARDIAC CATHETERIZATION N/A 09/01/2015   Procedure: Left Heart Cath and Coronary Angiography;  Surgeon: Laurier Nancy, MD;  Location: [facility] INVASIVE CV LAB;  Service: Cardiovascular;  Laterality: N/A;   CARDIAC CATHETERIZATION N/A 09/01/2015   Procedure: Coronary Stent Intervention;  Surgeon: Alwyn Pea, MD;  Location: [facility] INVASIVE CV LAB;  Service: Cardiovascular;  Laterality: N/A;   CATARACT EXTRACTION W/PHACO Left 11/23/2016   Procedure: CATARACT EXTRACTION PHACO AND INTRAOCULAR LENS PLACEMENT (IOC);  Surgeon: Lockie Mola, MD;  Location: [facility];  Service: Ophthalmology;  Laterality: Left;   CATARACT EXTRACTION W/PHACO Right 02/01/2017   Procedure: CATARACT EXTRACTION PHACO AND INTRAOCULAR LENS PLACEMENT (IOC) Complicated  right toric lens;  Surgeon: Lockie Mola, MD;  Location: [facility];  Service: Ophthalmology;  Laterality: Right;  Malyugin toric Lens   COLONOSCOPY WITH PROPOFOL N/A 01/04/2024   Procedure: COLONOSCOPY WITH PROPOFOL;  Surgeon: Midge Minium, MD;  Location[facility] ENDOSCOPY;  Service: Endoscopy;  Laterality: N/A;   KNEE ARTHROPLASTY Left 10/24/2022   Procedure: COMPUTER ASSISTED TOTAL KNEE ARTHROPLASTY;  Surgeon: Donato Heinz, MD;  Location: [facility] ORS;  Service: Orthopedics;  Laterality: Left;   LEFT HEART CATH AND CORONARY ANGIOGRAPHY Right 11/14/2017   Procedure: LEFT HEART CATH AND CORONARY ANGIOGRAPHY;  Surgeon: Laurier Nancy, MD;  Location: [facility] INVASIVE CV LAB;  Service: Cardiovascular;  Laterality: Right;   POLYPECTOMY  01/04/2024   Procedure: POLYPECTOMY;  Surgeon: Midge Minium, MD;  Location: [facility] ENDOSCOPY;  Service: Endoscopy;;   SCROTAL EXPLORATION Left 11/24/2020   Procedure: SCROTUM EXPLORATION WITH LEFT ORCHIECTOMY;  Surgeon: Noel Christmas, MD;  Location: [facility]  ORS;  Service: Urology;  Laterality: Left;   TONSILLECTOMY      SOCIAL HISTORY:   Social History   Tobacco Use   Smoking status: Never   Smokeless tobacco: Never  Substance Use Topics   Alcohol use: No    FAMILY HISTORY:   Family History  Problem Relation Age of Onset   Emphysema Mother    Emphysema Father     DRUG ALLERGIES:  No Known Allergies  REVIEW OF SYSTEMS:   ROS As per history of present illness. All pertinent systems were reviewed above. Constitutional, HEENT, cardiovascular, respiratory, GI, GU, musculoskeletal, neuro, psychiatric, endocrine, integumentary and hematologic systems were reviewed and are otherwise negative/unremarkable except for positive findings mentioned above in the HPI.   MEDICATIONS AT HOME:   Prior to Admission medications   Medication Sig Start Date End Date Taking? Authorizing Provider  acetaminophen (TYLENOL) 325 MG tablet Take 2 tablets (650 mg total) by mouth every 6 (six) hours as needed for mild pain or headache (fever >/=  101). 01/13/21   Lonia Blood, MD  albuterol (VENTOLIN HFA) 108 (90 Base) MCG/ACT inhaler Inhale 1 puff into the lungs every 4 (four) hours as needed. 10/27/21   [provider]  amiodarone (PACERONE) 200 MG tablet Take 1 tablet (200 mg total) by mouth daily. 05/18/20   Alford Highland, MD  cariprazine (VRAYLAR) 1.5 MG capsule Take 1 capsule (1.5 mg total) by mouth daily. 12/11/23 03/10/24  Sherron Monday, MD  cariprazine (VRAYLAR) 1.5 MG capsule Take 1 capsule (1.5 mg total) by mouth daily for 14 days. 12/11/23 12/25/23  Sherron Monday, MD  famotidine (PEPCID) 20 MG tablet Take 1 tablet (20 mg total) by mouth 2 (two) times daily for 15 days. 05/07/23 01/04/24  Sharman Cheek, MD  finasteride (PROSCAR) 5 MG tablet Take 1 tablet (5 mg total) by mouth daily. 05/18/20   Alford Highland, MD  ipratropium (ATROVENT) 0.06 % nasal spray Place 2 sprays into the nose 3 (three) times daily as needed. 03/26/23  03/25/24  [provider]  levothyroxine (SYNTHROID) 100 MCG tablet TAKE 1 TABLET BY MOUTH EVERY DAY IN THE MORNING 02/16/24   Sherron Monday, MD  naproxen (NAPROSYN) 500 MG tablet Take 500 mg by mouth 2 (two) times daily as needed for mild pain. Patient not taking: Reported on 09/25/2023 02/22/23   [provider]  ondansetron (ZOFRAN-ODT) 4 MG disintegrating tablet Take 1 tablet (4 mg total) by mouth every 8 (eight) hours as needed for nausea or vomiting. Patient not taking: Reported on 09/25/2023 05/07/23   Sharman Cheek, MD  PARoxetine (PAXIL) 20 MG tablet TAKE 1 TABLET BY MOUTH EVERY DAY 12/25/23   Sherron Monday, MD  promethazine-dextromethorphan (PROMETHAZINE-DM) 6.25-15 MG/5ML syrup Take 5 mLs by mouth every 6 (six) hours as needed for cough. Patient not taking: Reported on 09/25/2023 03/26/23   [provider]  rosuvastatin (CRESTOR) 10 MG tablet Take 1 tablet (10 mg total) by mouth daily. 07/28/23 07/27/24  Sherron Monday, MD  spironolactone (ALDACTONE) 25 MG tablet Take 0.5 tablets (12.5 mg total) by mouth daily. 05/19/20   Alford Highland, MD  tamsulosin (FLOMAX) 0.4 MG CAPS capsule Take 0.8 mg by mouth at bedtime.     [provider]      VITAL SIGNS:  Blood pressure 137/66, pulse (!) 58, temperature 97.9 F (36.6 C), temperature source Oral, resp. rate (!) 24, weight 86.2 kg, SpO2 96%.  PHYSICAL EXAMINATION:  Physical Exam  GENERAL:  83 y.o.-year-old Caucasian male patient lying in the bed with minimal respiratory distress with conversational dyspnea. EYES: Pupils equal, round, reactive to light and accommodation. No scleral icterus. Extraocular muscles intact.  HEENT: Head atraumatic, normocephalic. Oropharynx and nasopharynx clear.  NECK:  Supple, no jugular venous distention. No thyroid enlargement, no tenderness.  LUNGS: Diminished bibasal breath sounds with bibasal crackles.. No use of accessory muscles of respiration.  CARDIOVASCULAR:  Regular rate and rhythm, S1, S2 normal. No murmurs, rubs, or gallops.  ABDOMEN: Soft, nondistended, nontender. Bowel sounds present. No organomegaly or mass.  EXTREMITIES: No pedal edema, cyanosis, or clubbing.  NEUROLOGIC: Cranial nerves II through XII are intact. Muscle strength 5/5 in all extremities. Sensation intact. Gait not checked.  PSYCHIATRIC: The patient is alert and oriented x 3.  Normal affect and good eye contact. SKIN: No obvious rash, lesion, or ulcer.   LABORATORY PANEL:   CBC Recent Labs  Lab 03/10/24 2037  WBC 10.0  HGB 13.6  HCT 40.6  PLT 286   ------------------------------------------------------------------------------------------------------------------  Chemistries  Recent Labs  Lab 03/10/24 2037  NA 137  K 3.8  CL 101  CO2 28  GLUCOSE 151*  BUN 35*  CREATININE 0.87  CALCIUM 8.3*   ------------------------------------------------------------------------------------------------------------------  Cardiac Enzymes No results for input(s): "TROPONINI" in the last 168 hours. ------------------------------------------------------------------------------------------------------------------  RADIOLOGY:  DG Chest Portable 1 View Result Date: 03/10/2024 CLINICAL DATA:  Shortness of breath, flu positive x 1 week, new hypoxia, evaluating for secondary pneumonia EXAM: PORTABLE CHEST 1 VIEW COMPARISON:  Chest x-ray 03/29/2023 FINDINGS: The heart and mediastinal contours are unchanged. Atherosclerotic plaque. Low lung volumes. Question developing right mid lung zone and left mid to lower lung zone airspace opacities. Chronic coarsened interstitial markings with no overt pulmonary edema. No pleural effusion. No pneumothorax. No acute osseous abnormality. IMPRESSION: 1. Low lung volumes with bronchitic changes and question developing right mid lung zone and left mid to lower lung zone airspace opacities. Followup PA and lateral chest X-ray is recommended in 3-4 weeks  following therapy to ensure resolution. 2.  Aortic Atherosclerosis (ICD10-I70.0). Electronically Signed   By: Tish Frederickson M.D.   On: 03/10/2024 21:25      IMPRESSION AND PLAN:  Assessment and Plan: * Acute respiratory failure with hypoxia (HCC) - This like secondary to underlying community-acquired pneumonia. - The patient admitted to a medical telemetry bed. - O2 protocol will be followed. - Management otherwise as below.  Community acquired pneumonia - Will continue antibiotic therapy with IV Rocephin and Zithromax. - Mucolytic therapy be provided as well as duo nebs q.i.d. and q.4 hours p.r.n. - We will follow blood cultures.   Dyslipidemia - We will continue statin therapy.  Hypothyroidism - We will continue Synthroid.  Paroxysmal atrial fibrillation (HCC) - We will continue amiodarone.  Depression - We will continue Paxil.  BPH (benign prostatic hyperplasia) - We will continue Flomax.    DVT prophylaxis: Lovenox.  Advanced Care Planning:  Code Status: full code.  Family Communication:  The plan of care was discussed in details with the patient (and family). I answered all questions. The patient agreed to proceed with the above mentioned plan. Further management will depend upon hospital course. Disposition Plan: Back to previous home environment Consults called: none.  All the records are reviewed and case discussed with ED provider.  Status is: Inpatient   At the time of the admission, it appears that the appropriate admission status for this patient is inpatient.  This is judged to be reasonable and necessary in order to provide the required intensity of service to ensure the patient's safety given the presenting symptoms, physical exam findings and initial radiographic and laboratory data in the context of comorbid conditions.  The patient requires inpatient status due to high intensity of service, high risk of further deterioration and high frequency of  surveillance required.  I certify that at the time of admission, it is my clinical judgment that the patient will require inpatient hospital care extending more than 2 midnights.                            Dispo: The patient is from: Home              Anticipated d/c is to: Home              Patient currently is not medically stable to d/c.              Difficult to place patient: No  Hannah Beat M.D on 03/11/2024 at 2:00 AM  [facility]   From 7 PM-7 AM, contact night-coverage [email or web address]  CC: Primary care physician; Sherron Monday, MD

## 2024-03-10 NOTE — ED Provider Notes (Signed)
 [facility] Provider Note    Event Date/Time   First MD Initiated Contact with Patient 03/10/24 2029     (approximate)   History   Shortness of Breath   HPI Nathaniel Ibarra is a 83 y.o. 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  Patient was seen by his primary care provider 1 week ago for shortness of breath.  At that time he had chest x-ray with concern for pneumonia and started on Augmentin and doxycycline.  He was also found to be positive for influenza A.  Patient is on 4 L oxygen at home and called EMS for worsening shortness of breath.  He was found to be hypoxic and increased to nonrebreather.  Notices worsening productive cough.  Otherwise denies fever, chest pain, nausea, vomiting, abdominal pain, leg pain, leg swelling.  Reviewed PCP encounter and notes on 03/03/2024     Physical Exam   Triage Vital Signs: ED Triage Vitals  Encounter Vitals Group     BP 03/10/24 2043 139/62     Systolic BP Percentile --      Diastolic BP Percentile --      Pulse Rate 03/10/24 2031 63     Resp 03/10/24 2043 (!) 33     Temp 03/10/24 2043 (!) 97.5 F (36.4 C)     Temp Source 03/10/24 2043 Oral     SpO2 03/10/24 2030 (!) 76 %     Weight --      Height --      Head Circumference --      Peak Flow --      Pain Score 03/10/24 2032 0     Pain Loc --      Pain Education --      Exclude from Growth Chart --     Most recent vital signs: Vitals:   03/10/24 2043 03/10/24 2100  BP: 139/62 (!) 143/60  Pulse:  63  Resp: (!) 33 (!) 22  Temp: (!) 97.5 F (36.4 C)   SpO2:  97%   Physical Exam: I have reviewed the vital signs and nursing notes. General: Awake, alert, no acute distress.  Nontoxic appearing. Head:  Atraumatic, normocephalic.   ENT:  EOM intact, PERRL. Oral mucosa is pink and moist with no lesions. Neck: Neck is supple with full range of motion, No meningeal  signs. Cardiovascular:  RRR, No murmurs. Peripheral pulses palpable and equal bilaterally. Respiratory:  Symmetrical chest wall expansion.  Tachypnea with crackles and rales noted throughout.  Intermittent lower lung field wheezing present Musculoskeletal:  No cyanosis or edema. Moving extremities with full ROM Abdomen:  Soft, nontender, nondistended. Neuro:  GCS 15, moving all four extremities, interacting appropriately. Speech clear. Psych:  Calm, appropriate.   Skin:  Warm, dry, no rash.    ED Results / Procedures / Treatments   Labs (all labs ordered are listed, but only abnormal results are displayed) Labs Reviewed  RESP PANEL BY RT-PCR (RSV, FLU A&B, COVID)  RVPGX2 - Abnormal; Notable for the following components:      Result Value   Influenza A by PCR POSITIVE (*)    All other components within normal limits  CBC WITH DIFFERENTIAL/PLATELET - Abnormal; Notable for the following components:   Neutro Abs 8.3 (*)    Abs Immature Granulocytes 0.26 (*)    All other components within normal limits  BASIC METABOLIC PANEL - Abnormal; Notable for  the following components:   Glucose, Bld 151 (*)    BUN 35 (*)    Calcium 8.3 (*)    All other components within normal limits  BLOOD GAS, VENOUS - Abnormal; Notable for the following components:   pO2, Ven 46 (*)    Bicarbonate 32.2 (*)    Acid-Base Excess 6.0 (*)    All other components within normal limits  BRAIN NATRIURETIC PEPTIDE - Abnormal; Notable for the following components:   B Natriuretic Peptide 220.3 (*)    All other components within normal limits  PROCALCITONIN     EKG My EKG interpretation: Rate of 66, normal sinus rhythm, left bundle branch block.  No acute ST elevation or depression   RADIOLOGY Independently interpreted with bilateral hazy opacities   PROCEDURES:  Critical Care performed: Yes, see critical care procedure note(s)  .Critical Care  Performed by: Janith Lima, MD Authorized by: Janith Lima, MD   Critical care provider statement:    Critical care time (minutes):  30   Critical care was necessary to treat or prevent imminent or life-threatening deterioration of the following conditions:  Respiratory failure   Critical care was time spent personally by me on the following activities:  Development of treatment plan with patient or surrogate, discussions with consultants, evaluation of patient's response to treatment, examination of patient, ordering and review of laboratory studies, ordering and review of radiographic studies, ordering and performing treatments and interventions, pulse oximetry, re-evaluation of patient's condition and review of old charts   I assumed direction of critical care for this patient from another provider in my specialty: no     Care discussed with: admitting provider      MEDICATIONS ORDERED IN ED: Medications  sodium chloride 0.9 % bolus 1,000 mL (0 mLs Intravenous Stopped 03/10/24 2203)  ipratropium-albuterol (DUONEB) 0.5-2.5 (3) MG/3ML nebulizer solution 3 mL (3 mLs Nebulization Given 03/10/24 2049)     IMPRESSION / MDM / ASSESSMENT AND PLAN / ED COURSE  I reviewed the triage vital signs and the nursing notes.                              Differential diagnosis includes, but is not limited to, COVID, flu, RSV, pneumonia, CHF exacerbation  Patient's presentation is most consistent with acute presentation with potential threat to life or bodily function.  Patient is an 83 year old male presenting today for worsening shortness of breath and recently been on 4 L nasal cannula outpatient.  Is hypoxic on the 4 L and placed initially on nonrebreather with EMS.  Transition to high flow nasal cannula at 10 L on arrival here.  Vital signs otherwise appear stable.  Laboratory workup shows flu a positive but otherwise no leukocytosis or elevated procalcitonin level.  Chest x-ray with bilateral hazy opacities and difficult to determine if this is a  secondary pneumonia versus the initial influenza A.  Discussed case with hospitalist who agrees with initiation of antibiotics and obtaining blood cultures.  Patient admitted to hospitalist for further care of his acute hypoxic respiratory failure secondary to influenza A.  The patient is on the cardiac monitor to evaluate for evidence of arrhythmia and/or significant heart rate changes.     FINAL CLINICAL IMPRESSION(S) / ED DIAGNOSES   Final diagnoses:  Influenza A  Acute hypoxic respiratory failure (HCC)     Rx / DC Orders   ED Discharge Orders     None  Note:  This document was prepared using Dragon voice recognition software and may include unintentional dictation errors.   Janith Lima, MD 03/10/24 [id number]

## 2024-03-10 NOTE — ED Triage Notes (Signed)
 Pt arrives via EMS for Flu x1 week and SOB. O2 sat at 80% when EMS got there so put on nonrebreather and got up to 90%. EMS gave albuterol and duoneb and tried putting pt on 6L [place] but dropped again so they put him on the nonrebreather again. Hx of bradycardia  EMS vitals: 36 RR 111HR

## 2024-03-11 DIAGNOSIS — J9621 Acute and chronic respiratory failure with hypoxia: Secondary | ICD-10-CM

## 2024-03-11 DIAGNOSIS — I5022 Chronic systolic (congestive) heart failure: Secondary | ICD-10-CM

## 2024-03-11 DIAGNOSIS — J189 Pneumonia, unspecified organism: Secondary | ICD-10-CM | POA: Diagnosis present

## 2024-03-11 DIAGNOSIS — E785 Hyperlipidemia, unspecified: Secondary | ICD-10-CM

## 2024-03-11 DIAGNOSIS — E039 Hypothyroidism, unspecified: Secondary | ICD-10-CM | POA: Diagnosis not present

## 2024-03-11 DIAGNOSIS — I480 Paroxysmal atrial fibrillation: Secondary | ICD-10-CM | POA: Diagnosis not present

## 2024-03-11 LAB — CBC
HCT: 38.2 % — ABNORMAL LOW (ref 39.0–52.0)
Hemoglobin: 12.5 g/dL — ABNORMAL LOW (ref 13.0–17.0)
MCH: 32.2 pg (ref 26.0–34.0)
MCHC: 32.7 g/dL (ref 30.0–36.0)
MCV: 98.5 fL (ref 80.0–100.0)
Platelets: 237 10*3/uL (ref 150–400)
RBC: 3.88 MIL/uL — ABNORMAL LOW (ref 4.22–5.81)
RDW: 12.8 % (ref 11.5–15.5)
WBC: 8.3 10*3/uL (ref 4.0–10.5)
nRBC: 0 % (ref 0.0–0.2)

## 2024-03-11 LAB — BASIC METABOLIC PANEL
Anion gap: 10 (ref 5–15)
BUN: 28 mg/dL — ABNORMAL HIGH (ref 8–23)
CO2: 27 mmol/L (ref 22–32)
Calcium: 7.9 mg/dL — ABNORMAL LOW (ref 8.9–10.3)
Chloride: 103 mmol/L (ref 98–111)
Creatinine, Ser: 0.73 mg/dL (ref 0.61–1.24)
GFR, Estimated: >60 mL/min (ref >60)
Glucose, Bld: 99 mg/dL (ref 70–99)
Potassium: 3.7 mmol/L (ref 3.5–5.1)
Sodium: 140 mmol/L (ref 135–145)

## 2024-03-11 NOTE — ED Notes (Signed)
 Pt transported to assigned in-patient room on non-re breather

## 2024-03-11 NOTE — Assessment & Plan Note (Signed)
-   Will continue antibiotic therapy with IV Rocephin and Zithromax. - Mucolytic therapy be provided as well as duo nebs q.i.d. and q.4 hours p.r.n. - We will follow blood cultures.

## 2024-03-11 NOTE — Assessment & Plan Note (Signed)
 -  We will continue Synthroid.

## 2024-03-11 NOTE — Assessment & Plan Note (Signed)
-   This like secondary to underlying community-acquired pneumonia. - The patient admitted to a medical telemetry bed. - O2 protocol will be followed. - Management otherwise as below.

## 2024-03-11 NOTE — ED Notes (Signed)
 Pt helped up to the recliner. Pt O2 81%. O2 increased to 10L HFNC

## 2024-03-11 NOTE — Progress Notes (Signed)
 Progress Note   Patient: Nathaniel Ibarra:[id number] DOB: April 14, 1941 DOA: 03/10/2024     1 DOS: the patient was seen and examined on 03/11/2024   Brief hospital course: Nathaniel Ibarra is a 83 y.o. Caucasian male with medical history significant for HFrEF, GERD, cardiomyopathy, BPH, hypertension, dyslipidemia, hypothyroidism and coronary artery disease, who presented to the emergency room with acute onset of worsening dyspnea over the last week with associated mild dry cough as well as wheezing.  Patient recently finished Tamiflu therapy. Patient's respiratory panel positive for influenza, chest x-ray showed developing right midlung zone and left mid to lower lung zone airspace opacities.  Admitted to hospitalist service for further management evaluation of acute on chronic respiratory failure, influenza, pneumonia  Assessment and Plan: * Acute on chronic respiratory failure with hypoxia (HCC) In the setting of recent influenza, underlying community-acquired pneumonia. Patient is requiring high flow oxygen 8 to 10 L to maintain saturation greater than 92%. His level of care changed to progressive care unit. Patient will be continued on bronchodilator therapy, mucolytic's. Continue to wean off oxygen as tolerated.  Community acquired pneumonia Influenza A positive He recently finished Tamiflu therapy. Continue IV Rocephin and Zithromax. Continue mucolytic therapy be provided as well as duo nebs q.i.d. and q.4 hours p.r.n. We will follow blood cultures.  HFrEF: Prior echo reviewed 02/2022 showed recovered EF 55%. Not on GDMT except aldactone. He doe snot seem to be in exacerbation of heart failure. Will get repeat Echo for further management.  Dyslipidemia Continue statin therapy.  Hypothyroidism Continue home dose Synthroid.  Paroxysmal atrial fibrillation (HCC) Continue amiodarone.  Depression Continue Paxil.  BPH (benign prostatic hyperplasia) Continue  Flomax.     Out of bed to chair. Incentive spirometry. Nursing supportive care. Fall, aspiration precautions. Diet:  Diet Orders (From admission, onward)     Start     Ordered   03/10/24 2222  Diet Heart Room service appropriate? Yes; Fluid consistency: Thin  Diet effective now       Question Answer Comment  Room service appropriate? Yes   Fluid consistency: Thin      03/10/24 2223           DVT prophylaxis: enoxaparin (LOVENOX) injection 40 mg Start: 03/11/24 1000  Level of care: Progressive   Code Status: Full Code  Subjective: Patient is seen and examined today morning.  He has moderate respiratory distress, requiring 8 to 10 L supplemental oxygen.  Patient wears 2-3L oxygen at home.  Patient feels weak, eating fair.  Did not get out of bed.  Physical Exam: Vitals:   03/11/24 1310 03/11/24 1600 03/11/24 1700 03/11/24 1730  BP:  (!) 178/68 (!) 185/75 (!) 176/64  Pulse: (!) 58 61 61 61  Resp: (!) 26 (!) 24 (!) 30 (!) 30  Temp:      TempSrc:      SpO2: 90% 96% 95% 91%  Weight:        General - Elderly ill looking Caucasian male, moderate respiratory distress HEENT - PERRLA, EOMI, atraumatic head, non tender sinuses. Lung -decreased, diffuse rales, rhonchi, wheezes. Heart - S1, S2 heard, no murmurs, rubs, trace pedal edema. Abdomen - Soft, non tender, distended, bowel sounds decreased Neuro - Alert, awake and oriented x 3, non focal exam. Skin - Warm and dry.  Data Reviewed:      Latest Ref Rng & Units 03/11/2024    5:16 AM 03/10/2024    8:37 PM 12/06/2023   11:15 AM  CBC  WBC 4.0 - 10.5 K/uL 8.3  10.0  7.4   Hemoglobin 13.0 - 17.0 g/dL 16.1  09.6  04.5   Hematocrit 39.0 - 52.0 % 38.2  40.6  43.8   Platelets 150 - 400 K/uL 237  286  277       Latest Ref Rng & Units 03/11/2024    5:16 AM 03/10/2024    8:37 PM 12/06/2023   11:15 AM  BMP  Glucose 70 - 99 mg/dL 99  409  811   BUN 8 - 23 mg/dL 28  35  24   Creatinine 0.61 - 1.24 mg/dL 9.14  7.82  9.56    BUN/Creat Ratio 10 - 24   22   Sodium 135 - 145 mmol/L 140  137  141   Potassium 3.5 - 5.1 mmol/L 3.7  3.8  4.8   Chloride 98 - 111 mmol/L 103  101  104   CO2 22 - 32 mmol/L 27  28  24    Calcium 8.9 - 10.3 mg/dL 7.9  8.3  9.2    DG Chest Portable 1 View Result Date: 03/10/2024 CLINICAL DATA:  Shortness of breath, flu positive x 1 week, new hypoxia, evaluating for secondary pneumonia EXAM: PORTABLE CHEST 1 VIEW COMPARISON:  Chest x-ray 03/29/2023 FINDINGS: The heart and mediastinal contours are unchanged. Atherosclerotic plaque. Low lung volumes. Question developing right mid lung zone and left mid to lower lung zone airspace opacities. Chronic coarsened interstitial markings with no overt pulmonary edema. No pleural effusion. No pneumothorax. No acute osseous abnormality. IMPRESSION: 1. Low lung volumes with bronchitic changes and question developing right mid lung zone and left mid to lower lung zone airspace opacities. Followup PA and lateral chest X-ray is recommended in 3-4 weeks following therapy to ensure resolution. 2.  Aortic Atherosclerosis (ICD10-I70.0). Electronically Signed   By: Tish Frederickson M.D.   On: 03/10/2024 21:25    Family Communication: Discussed with patient, wife over phone, they understand and agree. All questions answered.  Disposition: Status is: Inpatient Remains inpatient appropriate because: Acute hypoxic respiratory failure  Planned Discharge Destination: Home with Home Health     MDM level 3-patient is diagnosed with influenza, pneumonia and currently requiring high flow oxygen, very high risk for sudden clinical deterioration.  Author: Marcelino Duster, MD 03/11/2024 5:43 PM Secure chat 7am to 7pm For on call review [email or web address].

## 2024-03-11 NOTE — ED Notes (Signed)
 Pt cleaned up after urinating. New brief and bed pads applied.

## 2024-03-11 NOTE — ED Notes (Signed)
 Pt helped back into the bed at this time.

## 2024-03-11 NOTE — Assessment & Plan Note (Signed)
-   We will continue Paxil. 

## 2024-03-11 NOTE — Assessment & Plan Note (Signed)
-   We will continue amiodarone.

## 2024-03-11 NOTE — Assessment & Plan Note (Signed)
 -  We will continue Flomax

## 2024-03-11 NOTE — Assessment & Plan Note (Signed)
 -  We will continue statin therapy.

## 2024-03-12 ENCOUNTER — Inpatient Hospital Stay (HOSPITAL_COMMUNITY)
Admit: 2024-03-12 | Discharge: 2024-03-12 | Disposition: A | Discharge status: Home / Self Care | Attending: Critical Care Medicine | Admitting: Critical Care Medicine

## 2024-03-12 ENCOUNTER — Inpatient Hospital Stay

## 2024-03-12 DIAGNOSIS — R0603 Acute respiratory distress: Secondary | ICD-10-CM

## 2024-03-12 DIAGNOSIS — J9621 Acute and chronic respiratory failure with hypoxia: Secondary | ICD-10-CM | POA: Diagnosis not present

## 2024-03-12 DIAGNOSIS — J09X2 Influenza due to identified novel influenza A virus with other respiratory manifestations: Secondary | ICD-10-CM

## 2024-03-12 DIAGNOSIS — I480 Paroxysmal atrial fibrillation: Secondary | ICD-10-CM | POA: Diagnosis not present

## 2024-03-12 DIAGNOSIS — J189 Pneumonia, unspecified organism: Secondary | ICD-10-CM | POA: Diagnosis not present

## 2024-03-12 DIAGNOSIS — J9601 Acute respiratory failure with hypoxia: Secondary | ICD-10-CM | POA: Diagnosis not present

## 2024-03-12 DIAGNOSIS — I5023 Acute on chronic systolic (congestive) heart failure: Secondary | ICD-10-CM

## 2024-03-12 DIAGNOSIS — E039 Hypothyroidism, unspecified: Secondary | ICD-10-CM | POA: Diagnosis not present

## 2024-03-12 LAB — BLOOD GAS, ARTERIAL
Acid-Base Excess: 2.2 mmol/L — ABNORMAL HIGH (ref 0.0–2.0)
Acid-Base Excess: 3.2 mmol/L — ABNORMAL HIGH (ref 0.0–2.0)
Acid-Base Excess: 6.6 mmol/L — ABNORMAL HIGH (ref 0.0–2.0)
Acid-Base Excess: 5.6 mmol/L — ABNORMAL HIGH (ref 0.0–2.0)
Bicarbonate: 29.1 mmol/L — ABNORMAL HIGH (ref 20.0–28.0)
Bicarbonate: 29.9 mmol/L — ABNORMAL HIGH (ref 20.0–28.0)
Bicarbonate: 31.9 mmol/L — ABNORMAL HIGH (ref 20.0–28.0)
Bicarbonate: 33.1 mmol/L — ABNORMAL HIGH (ref 20.0–28.0)
Delivery systems: POSITIVE
Delivery systems: POSITIVE
Delivery systems: POSITIVE
Expiratory PAP: 6 cmH2O
Expiratory PAP: 6 cmH2O
Expiratory PAP: 8 cmH2O
FIO2: 100 %
FIO2: 100 %
FIO2: 100 %
FIO2: 100 %
Inspiratory PAP: 12 cmH2O
Inspiratory PAP: 12 cmH2O
Inspiratory PAP: 14 cmH2O
MECHVT: 500 mL
Mechanical Rate: 10
Mechanical Rate: 20
O2 Saturation: 94.8 %
O2 Saturation: 93.2 %
O2 Saturation: 83.9 %
O2 Saturation: 98 %
PEEP: 15 cmH2O
Patient temperature: 37
Patient temperature: 37
Patient temperature: 37
Patient temperature: 37
pCO2 arterial: 54 mmHg — ABNORMAL HIGH (ref 32–48)
pCO2 arterial: 53 mmHg — ABNORMAL HIGH (ref 32–48)
pCO2 arterial: 47 mmHg (ref 32–48)
pCO2 arterial: 60 mmHg — ABNORMAL HIGH (ref 32–48)
pH, Arterial: 7.34 — ABNORMAL LOW (ref 7.35–7.45)
pH, Arterial: 7.36 (ref 7.35–7.45)
pH, Arterial: 7.44 (ref 7.35–7.45)
pH, Arterial: 7.35 (ref 7.35–7.45)
pO2, Arterial: 73 mmHg — ABNORMAL LOW (ref 83–108)
pO2, Arterial: 67 mmHg — ABNORMAL LOW (ref 83–108)
pO2, Arterial: 49 mmHg — ABNORMAL LOW (ref 83–108)
pO2, Arterial: 149 mmHg — ABNORMAL HIGH (ref 83–108)

## 2024-03-12 LAB — BASIC METABOLIC PANEL
Anion gap: 6 (ref 5–15)
BUN: 16 mg/dL (ref 8–23)
CO2: 27 mmol/L (ref 22–32)
Calcium: 7.9 mg/dL — ABNORMAL LOW (ref 8.9–10.3)
Chloride: 103 mmol/L (ref 98–111)
Creatinine, Ser: 0.67 mg/dL (ref 0.61–1.24)
GFR, Estimated: >60 mL/min (ref >60)
Glucose, Bld: 133 mg/dL — ABNORMAL HIGH (ref 70–99)
Potassium: 4 mmol/L (ref 3.5–5.1)
Sodium: 136 mmol/L (ref 135–145)

## 2024-03-12 LAB — CBC
HCT: 39.8 % (ref 39.0–52.0)
HCT: 41.1 % (ref 39.0–52.0)
Hemoglobin: 12.9 g/dL — ABNORMAL LOW (ref 13.0–17.0)
Hemoglobin: 13.5 g/dL (ref 13.0–17.0)
MCH: 31.9 pg (ref 26.0–34.0)
MCH: 32.1 pg (ref 26.0–34.0)
MCHC: 32.4 g/dL (ref 30.0–36.0)
MCHC: 32.8 g/dL (ref 30.0–36.0)
MCV: 98.3 fL (ref 80.0–100.0)
MCV: 97.6 fL (ref 80.0–100.0)
Platelets: 258 10*3/uL (ref 150–400)
Platelets: 265 10*3/uL (ref 150–400)
RBC: 4.05 MIL/uL — ABNORMAL LOW (ref 4.22–5.81)
RBC: 4.21 MIL/uL — ABNORMAL LOW (ref 4.22–5.81)
RDW: 12.9 % (ref 11.5–15.5)
RDW: 13 % (ref 11.5–15.5)
WBC: 11.6 10*3/uL — ABNORMAL HIGH (ref 4.0–10.5)
WBC: 12 10*3/uL — ABNORMAL HIGH (ref 4.0–10.5)
nRBC: 0 % (ref 0.0–0.2)
nRBC: 0 % (ref 0.0–0.2)

## 2024-03-12 LAB — COMPREHENSIVE METABOLIC PANEL
ALT: 147 U/L — ABNORMAL HIGH (ref 0–44)
AST: 108 U/L — ABNORMAL HIGH (ref 15–41)
Albumin: 2.2 g/dL — ABNORMAL LOW (ref 3.5–5.0)
Alkaline Phosphatase: 75 U/L (ref 38–126)
Anion gap: 7 (ref 5–15)
BUN: 17 mg/dL (ref 8–23)
CO2: 28 mmol/L (ref 22–32)
Calcium: 8.1 mg/dL — ABNORMAL LOW (ref 8.9–10.3)
Chloride: 102 mmol/L (ref 98–111)
Creatinine, Ser: 0.71 mg/dL (ref 0.61–1.24)
GFR, Estimated: >60 mL/min (ref >60)
Glucose, Bld: 128 mg/dL — ABNORMAL HIGH (ref 70–99)
Potassium: 3.8 mmol/L (ref 3.5–5.1)
Sodium: 137 mmol/L (ref 135–145)
Total Bilirubin: 0.5 mg/dL (ref 0.0–1.2)
Total Protein: 5.5 g/dL — ABNORMAL LOW (ref 6.5–8.1)

## 2024-03-12 LAB — MAGNESIUM: Magnesium: 1.9 mg/dL (ref 1.7–2.4)

## 2024-03-12 LAB — PHOSPHORUS: Phosphorus: 2.6 mg/dL (ref 2.5–4.6)

## 2024-03-12 LAB — MRSA NEXT GEN BY PCR, NASAL: MRSA by PCR Next Gen: NOT DETECTED

## 2024-03-12 LAB — EXPECTORATED SPUTUM ASSESSMENT W GRAM STAIN, RFLX TO RESP C

## 2024-03-12 LAB — LACTIC ACID, PLASMA: Lactic Acid, Venous: 1.2 mmol/L (ref 0.5–1.9)

## 2024-03-12 LAB — PROCALCITONIN: Procalcitonin: <0.1 ng/mL

## 2024-03-12 LAB — D-DIMER, QUANTITATIVE: D-Dimer, Quant: 1.94 ug{FEU}/mL — ABNORMAL HIGH (ref 0.00–0.50)

## 2024-03-12 LAB — GLUCOSE, CAPILLARY: Glucose-Capillary: 124 mg/dL — ABNORMAL HIGH (ref 70–99)

## 2024-03-12 LAB — BRAIN NATRIURETIC PEPTIDE: B Natriuretic Peptide: 599.3 pg/mL — ABNORMAL HIGH (ref 0.0–100.0)

## 2024-03-12 MED ORDER — PROPOFOL 1000 MG/100ML IV EMUL
5.0000 ug/kg/min | INTRAVENOUS | Status: DC
Start: 2024-03-12 — End: 2024-03-21
  Administered 2024-03-12: 5 ug/kg/min via INTRAVENOUS
  Administered 2024-03-13 (×2): 20 ug/kg/min via INTRAVENOUS
  Administered 2024-03-14: 40 ug/kg/min via INTRAVENOUS
  Administered 2024-03-14 (×2): 20 ug/kg/min via INTRAVENOUS
  Administered 2024-03-14: 30 ug/kg/min via INTRAVENOUS
  Administered 2024-03-15 – 2024-03-17 (×11): 40 ug/kg/min via INTRAVENOUS
  Administered 2024-03-17: 50 ug/kg/min via INTRAVENOUS
  Administered 2024-03-17: 40 ug/kg/min via INTRAVENOUS
  Administered 2024-03-17 – 2024-03-19 (×10): 50 ug/kg/min via INTRAVENOUS
  Administered 2024-03-19: 40 ug/kg/min via INTRAVENOUS
  Administered 2024-03-19 (×2): 50 ug/kg/min via INTRAVENOUS
  Administered 2024-03-19: 40 ug/kg/min via INTRAVENOUS
  Administered 2024-03-20 (×3): 50 ug/kg/min via INTRAVENOUS
  Administered 2024-03-20: 60 ug/kg/min via INTRAVENOUS
  Administered 2024-03-21: 25 ug/kg/min via INTRAVENOUS
  Filled 2024-03-12 (×45): qty 100

## 2024-03-12 MED ORDER — FUROSEMIDE 10 MG/ML IJ SOLN
80.0000 mg | Freq: Once | INTRAMUSCULAR | Status: AC
  Administered 2024-03-12: 80 mg via INTRAVENOUS
  Filled 2024-03-12: qty 8

## 2024-03-12 MED ORDER — ORAL CARE MOUTH RINSE: 15.0000 mL | OROMUCOSAL | Status: DC | PRN

## 2024-03-12 MED ORDER — ETOMIDATE 2 MG/ML IV SOLN
INTRAVENOUS | Status: AC
  Administered 2024-03-12: 20 mg
  Filled 2024-03-12: qty 10

## 2024-03-12 MED ORDER — ORAL CARE MOUTH RINSE
15.0000 mL | OROMUCOSAL | Status: DC
  Administered 2024-03-13 – 2024-03-15 (×32): 15 mL via OROMUCOSAL

## 2024-03-12 MED ORDER — ROCURONIUM BROMIDE 10 MG/ML (PF) SYRINGE
PREFILLED_SYRINGE | INTRAVENOUS | Status: AC
  Administered 2024-03-12: 90 mg
  Filled 2024-03-12: qty 10

## 2024-03-12 MED ORDER — DEXMEDETOMIDINE HCL IN NACL 400 MCG/100ML IV SOLN
0.0000 ug/kg/h | INTRAVENOUS | Status: DC
  Administered 2024-03-12: 0.4 ug/kg/h via INTRAVENOUS
  Filled 2024-03-12: qty 100

## 2024-03-12 MED ORDER — FAMOTIDINE 20 MG PO TABS: 20.0000 mg | ORAL_TABLET | Freq: Two times a day (BID) | ORAL | Status: DC

## 2024-03-12 MED ORDER — IPRATROPIUM-ALBUTEROL 0.5-2.5 (3) MG/3ML IN SOLN: 3.0000 mL | Freq: Four times a day (QID) | RESPIRATORY_TRACT | Status: DC

## 2024-03-12 MED ORDER — MORPHINE SULFATE (PF) 2 MG/ML IV SOLN
1.0000 mg | INTRAVENOUS | Status: AC
  Administered 2024-03-12: 1 mg via INTRAVENOUS
  Filled 2024-03-12: qty 1

## 2024-03-12 MED ORDER — IPRATROPIUM-ALBUTEROL 0.5-2.5 (3) MG/3ML IN SOLN
3.0000 mL | Freq: Four times a day (QID) | RESPIRATORY_TRACT | Status: DC
  Administered 2024-03-12 – 2024-03-21 (×35): 3 mL via RESPIRATORY_TRACT
  Filled 2024-03-12 (×35): qty 3

## 2024-03-12 MED ORDER — DOCUSATE SODIUM 50 MG/5ML PO LIQD
100.0000 mg | Freq: Two times a day (BID) | ORAL | Status: DC
  Administered 2024-03-12 – 2024-03-23 (×22): 100 mg
  Filled 2024-03-12 (×22): qty 10

## 2024-03-12 MED ORDER — MORPHINE SULFATE (PF) 2 MG/ML IV SOLN
2.0000 mg | Freq: Once | INTRAVENOUS | Status: AC
  Administered 2024-03-12: 2 mg via INTRAVENOUS
  Filled 2024-03-12: qty 1

## 2024-03-12 MED ORDER — CHLORHEXIDINE GLUCONATE CLOTH 2 % EX PADS
6.0000 | MEDICATED_PAD | Freq: Every day | CUTANEOUS | Status: DC
  Administered 2024-03-12 – 2024-03-21 (×10): 6 via TOPICAL

## 2024-03-12 MED ORDER — FENTANYL CITRATE PF 50 MCG/ML IJ SOSY
25.0000 ug | PREFILLED_SYRINGE | INTRAMUSCULAR | Status: AC | PRN
  Administered 2024-03-13 – 2024-03-16 (×3): 25 ug via INTRAVENOUS
  Filled 2024-03-12 (×3): qty 1

## 2024-03-12 MED ORDER — FUROSEMIDE 10 MG/ML IJ SOLN
40.0000 mg | Freq: Every day | INTRAMUSCULAR | Status: DC
  Administered 2024-03-12 – 2024-03-23 (×11): 40 mg via INTRAVENOUS
  Filled 2024-03-12 (×12): qty 4

## 2024-03-12 MED ORDER — FENTANYL CITRATE (PF) 100 MCG/2ML IJ SOLN
INTRAMUSCULAR | Status: AC
  Administered 2024-03-12: 100 ug
  Filled 2024-03-12: qty 2

## 2024-03-12 MED ORDER — PROPOFOL 1000 MG/100ML IV EMUL
INTRAVENOUS | Status: AC
  Filled 2024-03-12: qty 100

## 2024-03-12 MED ORDER — FUROSEMIDE 10 MG/ML IJ SOLN
40.0000 mg | Freq: Once | INTRAMUSCULAR | Status: AC
  Administered 2024-03-12: 40 mg via INTRAVENOUS
  Filled 2024-03-12: qty 4

## 2024-03-12 MED ORDER — FENTANYL CITRATE PF 50 MCG/ML IJ SOSY
25.0000 ug | PREFILLED_SYRINGE | INTRAMUSCULAR | Status: DC | PRN
Start: 2024-03-12 — End: 2024-03-24
  Administered 2024-03-14 – 2024-03-18 (×3): 50 ug via INTRAVENOUS
  Filled 2024-03-12 (×4): qty 1

## 2024-03-12 MED ORDER — METHYLPREDNISOLONE SODIUM SUCC 40 MG IJ SOLR
40.0000 mg | Freq: Two times a day (BID) | INTRAMUSCULAR | Status: DC
  Administered 2024-03-12 – 2024-03-21 (×19): 40 mg via INTRAVENOUS
  Filled 2024-03-12 (×19): qty 1

## 2024-03-12 MED ORDER — HYDRALAZINE HCL 20 MG/ML IJ SOLN
10.0000 mg | Freq: Four times a day (QID) | INTRAMUSCULAR | Status: DC | PRN
  Administered 2024-03-15: 10 mg via INTRAVENOUS
  Filled 2024-03-12: qty 1

## 2024-03-12 MED ORDER — POLYETHYLENE GLYCOL 3350 17 G PO PACK
17.0000 g | PACK | Freq: Every day | ORAL | Status: DC
  Administered 2024-03-13 – 2024-03-22 (×10): 17 g
  Filled 2024-03-12 (×10): qty 1

## 2024-03-12 NOTE — IPAL (Signed)
  Interdisciplinary Goals of Care Family Meeting   Date carried out: 03/12/2024  Location of the meeting: Phone conference  Member's involved: Physician, Nurse Practitioner, Bedside Registered Nurse, and Family Member or next of kin   GOALS OF CARE DISCUSSION  The Clinical status was relayed to family in detail- Son and Wife  Updated and notified of patients medical condition- Patient with increased WOB and using accessory muscles to breathe Explained to family course of therapy and the modalities    PATIENT REMAINS FULL CODE  Family understands the situation. SEVERE RESP FAILURE AND SEVERE PNEUMONIA HIGH RISK FOR INTUBATION AND CARDIAC ARREST  Family are satisfied with Plan of action and management. All questions answered  Additional CC time 35 mins   Nyima Vanacker Santiago Glad, M.D.  [facility]  Medical Director [facility] Medical Director [facility] Cardio-Pulmonary Department

## 2024-03-12 NOTE — Progress Notes (Signed)
 RT removed pt from BiPAP and placed on HHFNC 60L 100% at 0730. Approximately 0745 pt placed back on BiPAP due to resp distress.

## 2024-03-12 NOTE — Progress Notes (Signed)
 Pt's wife updated via phone with pt's verbal permission. Visitation guidelines & information discussed and password set up with pt's wife. Pt's wife requesting phone call with update on lab and imaging results after they have resulted.

## 2024-03-12 NOTE — Progress Notes (Signed)
 Progress Note   Patient: Nathaniel Ibarra HYQ:[id number] DOB: 05-23-1941 DOA: 03/10/2024     2 DOS: the patient was seen and examined on 03/12/2024   Brief hospital course: VIN YONKE is a 83 y.o. Caucasian male with medical history significant for HFrEF, GERD, cardiomyopathy, BPH, hypertension, dyslipidemia, hypothyroidism and coronary artery disease, who presented to the emergency room with acute onset of worsening dyspnea over the last week with associated mild dry cough as well as wheezing.  Patient recently finished Tamiflu therapy. Patient's respiratory panel positive for influenza, chest x-ray showed developing right midlung zone and left mid to lower lung zone airspace opacities.  Admitted to hospitalist service for further management evaluation of acute on chronic respiratory failure, influenza, pneumonia  Assessment and Plan: * Acute on chronic respiratory failure with hypoxia (HCC) Community acquired pneumonia Influenza A positive- Patient is currently on Bipap 60L 100% O2 to maintain saturation greater than 94%. His level of care changed to step down unit. Patient will be continued on bronchodilator therapy, mucolytic's. Continue to wean off Bipap as tolerated to home oxygen of 4L. PCCM consult appreciated. D-dimer elevated, V/Q scan ordered. He recently finished Tamiflu therapy. Continue IV Rocephin and Zithromax. Continue mucolytic therapy be provided as well as duo nebs q.i.d. and q.4 hours p.r.n. Sputum cultures pending.  HFrEF: Prior echo reviewed 02/2022 showed recovered EF 55%. Not on GDMT except aldactone. He doe snot seem to be in exacerbation of heart failure. Will get repeat Echo for further management.  Dyslipidemia Continue statin therapy.  Hypothyroidism Continue home dose Synthroid.  Paroxysmal atrial fibrillation (HCC) Continue amiodarone.  Depression Continue Paxil.  BPH (benign prostatic hyperplasia) Continue Flomax.    Nursing  supportive care. Fall, aspiration precautions. Diet:  Diet Orders (From admission, onward)     Start     Ordered   03/12/24 0744  Diet NPO time specified Except for: Ice Chips, Sips with Meds  Diet effective now       Question Answer Comment  Except for Ice Chips   Except for Sips with Meds      03/12/24 0744           DVT prophylaxis: enoxaparin (LOVENOX) injection 40 mg Start: 03/11/24 1000  Level of care: Progressive   Code Status: Full Code  Subjective: Patient is seen and examined today morning.  He was requiring high flow supplemental oxygen and yesterday place don Bipap transferred to ICU.  Patient wishes to eat, advised to wait for Bipap to be off. His energy poor. Discussed with wife over phone.  Physical Exam: Vitals:   03/12/24 1200 03/12/24 1300 03/12/24 1318 03/12/24 1400  BP: (!) 168/71 (!) 182/66  (!) 157/75  Pulse: 65 67  70  Resp: 18 (!) 24  (!) 27  Temp:      TempSrc:      SpO2: (!) 89% 91% (!) 88% (!) 89%  Weight:      Height:        General - Elderly ill looking Caucasian male, respiratory distress on Bipap HEENT - PERRLA, EOMI, atraumatic head, non tender sinuses. Lung -decreased, diffuse rales, rhonchi, wheezes. Heart - S1, S2 heard, no murmurs, rubs, trace pedal edema. Abdomen - Soft, non tender, distended, bowel sounds decreased Neuro - Alert, awake and oriented x 3, non focal exam. Skin - Warm and dry.  Data Reviewed:      Latest Ref Rng & Units 03/12/2024    6:04 AM 03/12/2024    4:31 AM 03/11/2024  5:16 AM  CBC  WBC 4.0 - 10.5 K/uL 12.0  11.6  8.3   Hemoglobin 13.0 - 17.0 g/dL 16.1  09.6  04.5   Hematocrit 39.0 - 52.0 % 41.1  39.8  38.2   Platelets 150 - 400 K/uL 265  258  237       Latest Ref Rng & Units 03/12/2024    6:04 AM 03/12/2024    4:31 AM 03/11/2024    5:16 AM  BMP  Glucose 70 - 99 mg/dL 409  811  99   BUN 8 - 23 mg/dL 17  16  28    Creatinine 0.61 - 1.24 mg/dL 9.14  7.82  9.56   Sodium 135 - 145 mmol/L 137  136  140    Potassium 3.5 - 5.1 mmol/L 3.8  4.0  3.7   Chloride 98 - 111 mmol/L 102  103  103   CO2 22 - 32 mmol/L 28  27  27    Calcium 8.9 - 10.3 mg/dL 8.1  7.9  7.9    DG Chest Port 1 View Result Date: 03/12/2024 CLINICAL DATA:  Acute respiratory distress EXAM: PORTABLE CHEST 1 VIEW COMPARISON:  03/10/2024 FINDINGS: Low volume chest with patchy indistinct density in the bilateral lungs that is progressed especially in the upper lung zones. Enlarged heart size accentuated by mediastinal fat. No significant pleural fluid and no pneumothorax. IMPRESSION: Low volume chest with progressive bilateral airspace disease favoring pneumonia. Electronically Signed   By: Tiburcio Pea M.D.   On: 03/12/2024 06:20   DG Chest Portable 1 View Result Date: 03/10/2024 CLINICAL DATA:  Shortness of breath, flu positive x 1 week, new hypoxia, evaluating for secondary pneumonia EXAM: PORTABLE CHEST 1 VIEW COMPARISON:  Chest x-ray 03/29/2023 FINDINGS: The heart and mediastinal contours are unchanged. Atherosclerotic plaque. Low lung volumes. Question developing right mid lung zone and left mid to lower lung zone airspace opacities. Chronic coarsened interstitial markings with no overt pulmonary edema. No pleural effusion. No pneumothorax. No acute osseous abnormality. IMPRESSION: 1. Low lung volumes with bronchitic changes and question developing right mid lung zone and left mid to lower lung zone airspace opacities. Followup PA and lateral chest X-ray is recommended in 3-4 weeks following therapy to ensure resolution. 2.  Aortic Atherosclerosis (ICD10-I70.0). Electronically Signed   By: Tish Frederickson M.D.   On: 03/10/2024 21:25    Family Communication: Discussed with patient, wife over phone, they understand and agree. All questions answered.  Disposition: Status is: Inpatient Remains inpatient appropriate because: Acute hypoxic respiratory failure  Planned Discharge Destination: Home with Home Health     MDM level  3-patient is diagnosed with influenza, pneumonia and currently requiring bipap in ICU, very high risk for sudden clinical deterioration.  Author: Marcelino Duster, MD 03/12/2024 2:40 PM Secure chat 7am to 7pm For on call review [email or web address].

## 2024-03-12 NOTE — Consult Note (Signed)
 NAME:  Nathaniel Ibarra, MRN:  [id number], DOB:  09-Jun-1941, LOS: 2 ADMISSION DATE:  03/10/2024, CONSULTATION DATE:  03/12/24 REFERRING MD:  Valente David CHIEF COMPLAINT:  Respiratory Failure   History of Present Illness:  Nathaniel Ibarra is an 83 yo with a PMH significant for HTN, HLD, HFrEF, CM, CAD, Oxygen dependent 4 Liters (which he rarely uses), GERD, BPH, Depression, Hypothyroidism that presented to the ER with worsening dyspnea over the past week. To review, patient tested positive for Influenza A with associated PNA 03/03/24 and completed a course of Tamiflu, prednisone taper, and antibiotics. Upon evaluation in the ER he was afebrile, 97% on 8 Liters, CXR low lung volumes with bronchitic changes and questionable developing right midlung zone and left mid to lower lung zone airspace opacities. He was started on antibiotics and HFNC, cultures sent. Overnight a RAPID was called for increased work of breathing.  Was placed on 100% bipap, lasix 40 mg x 1, Morphine, ABG, CXR, D-dimer sent.   PCCM was consulted for medical management of patients Respiratory failure  Pertinent  Medical History  HTN, HLD, HFrEF, CM, CAD, Oxygen dependent 4 Liters (which he rarely uses), GERD, BPH, Depression, Hypothyroidism  Significant Hospital Events: Including procedures, antibiotic start and stop dates in addition to other pertinent events   3/24: Admitted to the hospitalist service with CAP w/ respiratory failure. Abx and HFNC. Rapid response, Bipap 100% and transferred to the ICU  Interim History / Subjective:  Arrived to ICU 3/25  Objective   Blood pressure 117/67, pulse 64, temperature 97.9 F (36.6 C), temperature source Axillary, resp. rate (!) 24, height 6' (1.829 m), weight 87.9 kg, SpO2 94%.    FiO2 (%):  [75 %-100 %] 100 %   Intake/Output Summary (Last 24 hours) at 03/12/2024 0645 Last data filed at 03/12/2024 0630 Gross per 24 hour  Intake 1211.34 ml  Output 850 ml  Net 361.34 ml   Filed  Weights   03/11/24 0045 03/12/24 0100 03/12/24 0515  Weight: 86.2 kg 86.6 kg 87.9 kg  Physical Exam Vitals reviewed.  Constitutional:      Appearance: He is well-developed.  HENT:     Head: Normocephalic and atraumatic.     Mouth/Throat:     Mouth: Mucous membranes are moist.  Eyes:     Extraocular Movements: Extraocular movements intact.     Pupils: Pupils are equal, round, and reactive to light.  Cardiovascular:     Rate and Rhythm: Normal rate.  Pulmonary:     Effort: Pulmonary effort is normal. Tachypnea present.     Comments: Coarse, wheezes throughout Abdominal:     General: Bowel sounds are normal.     Palpations: Abdomen is soft.  Musculoskeletal:        General: Normal range of motion.     Cervical back: Normal range of motion.  Skin:    General: Skin is warm and dry.     Capillary Refill: Capillary refill takes less than 2 seconds.  Neurological:     General: No focal deficit present.     Mental Status: He is alert.  Psychiatric:        Mood and Affect: Mood normal.      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 (intermittently utilizes) Community Acquired PNA Influenza A -Oxygen as needed to achieve SpO2 > 90% -Requiring Bipap 100%, wean as tolerated -ABG now and  as needed -Send D-dimer, consider CTA to r/o PE -Duonebs QID and PRN -Lasix 40 mg IV x 1, repeat as needed -Continue Mucolytic -Solumedrol 40 mg Q 12 hours -Azithromycin + Ceftriaxone   Paroxysmal atrial fibrillation HFrEF, 3/23 EF 55% -Continue amiodarone and aldactone -Consider repeat Echo  Dyslipidemia -Continue Crestor   Hypothyroidism -Continue Synthroid   Depression -Continue Paxil and Vraylar   BPH (benign prostatic hyperplasia) -Continue Flomax and Proscar  Best Practice (right click and "Reselect all SmartList Selections" daily)   Diet/type: NPO w/ oral meds pending  improved respiratory status DVT prophylaxis SCD, Lovenox Pressure ulcer(s): N/A GI prophylaxis: Pepcid Lines: N/A Foley:  N/A Code Status:  full code Last date of multidisciplinary goals of care discussion [Patient updated]  Labs   CBC: Recent Labs  Lab 03/10/24 2037 03/11/24 0516 03/12/24 0431 03/12/24 0604  WBC 10.0 8.3 11.6* 12.0*  NEUTROABS 8.3*  --   --   --   HGB 13.6 12.5* 12.9* 13.5  HCT 40.6 38.2* 39.8 41.1  MCV 95.8 98.5 98.3 97.6  PLT 286 237 258 265   Basic Metabolic Panel: Recent Labs  Lab 03/10/24 2037 03/11/24 0516 03/12/24 0431  NA 137 140 136  K 3.8 3.7 4.0  CL 101 103 103  CO2 28 27 27   GLUCOSE 151* 99 133*  BUN 35* 28* 16  CREATININE 0.87 0.73 0.67  CALCIUM 8.3* 7.9* 7.9*   GFR: Estimated Creatinine Clearance: 78.1 mL/min (by C-G formula based on SCr of 0.67 mg/dL). Recent Labs  Lab 03/10/24 2037 03/11/24 0516 03/12/24 0431 03/12/24 0604  PROCALCITON 0.10  --   --   --   WBC 10.0 8.3 11.6* 12.0*  LATICACIDVEN  --   --   --  1.2   ABG    Component Value Date/Time   PHART 7.36 03/12/2024 0600   PCO2ART 53 (H) 03/12/2024 0600   PO2ART 67 (L) 03/12/2024 0600   HCO3 29.9 (H) 03/12/2024 0600   TCO2 26 09/07/2007 1236   O2SAT 93.2 03/12/2024 0600    HbA1C: Hgb A1c MFr Bld  Date/Time Value Ref Range Status  07/26/2023 09:57 AM 6.1 (H) 4.8 - 5.6 % Final    Comment:             Prediabetes: 5.7 - 6.4          Diabetes: >6.4          Glycemic control for adults with diabetes: <7.0   12/30/2020 04:17 AM 5.5 4.8 - 5.6 % Final    Comment:    (NOTE) Pre diabetes:          5.7%-6.4%  Diabetes:              >6.4%  Glycemic control for   <7.0% adults with diabetes    ABG: Recent Labs  Lab 03/12/24 0501  GLUCAP 124*    Review of Systems:   Denies chest pain, headache, abdominal pain, nausea, vomiting, diarrhea, fevers. Complains of mild shortness of breath  Past Medical History:  He,  has a past medical history of Aortic  atherosclerosis (HCC), Basal cell carcinoma (05/18/2022), Bladder cancer (HCC) (2008), BPH (benign prostatic hyperplasia), Bradycardia, Cardiomyopathy (HCC), Cataract, bilateral, Coronary artery disease, Depression, GERD (gastroesophageal reflux disease), HFrEF (heart failure with reduced ejection fraction) (HCC), History of 2019 novel coronavirus disease (COVID-19), HLD (hyperlipidemia), Hypertension, Hypothyroidism, LBBB (left bundle branch block), Long term current use of amiodarone, NSTEMI (non-ST elevated myocardial infarction) (HCC) (2016), Osteoarthritis, Paroxysmal atrial fibrillation (HCC), Peripheral  edema, Pneumonia due to COVID-19 virus (12/2020), Sleep difficulties, 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   Surgical History:   Past Surgical History:  Procedure Laterality Date   BIOPSY  01/04/2024   Procedure: BIOPSY;  Surgeon: Midge Minium, MD;  Location: [facility] ENDOSCOPY;  Service: Endoscopy;;   CARDIAC CATHETERIZATION N/A 09/01/2015   Procedure: Left Heart Cath and Coronary Angiography;  Surgeon: Laurier Nancy, MD;  Location: [facility] INVASIVE CV LAB;  Service: Cardiovascular;  Laterality: N/A;   CARDIAC CATHETERIZATION N/A 09/01/2015   Procedure: Coronary Stent Intervention;  Surgeon: Alwyn Pea, MD;  Location: [facility] INVASIVE CV LAB;  Service: Cardiovascular;  Laterality: N/A;   CATARACT EXTRACTION W/PHACO Left 11/23/2016   Procedure: CATARACT EXTRACTION PHACO AND INTRAOCULAR LENS PLACEMENT (IOC);  Surgeon: Lockie Mola, MD;  Location: [facility];  Service: Ophthalmology;  Laterality: Left;   CATARACT EXTRACTION W/PHACO Right 02/01/2017   Procedure: CATARACT EXTRACTION PHACO AND INTRAOCULAR LENS PLACEMENT (IOC) Complicated  right toric lens;  Surgeon: Lockie Mola, MD;  Location: [facility];  Service: Ophthalmology;  Laterality: Right;  Malyugin toric Lens   COLONOSCOPY WITH PROPOFOL N/A 01/04/2024    Procedure: COLONOSCOPY WITH PROPOFOL;  Surgeon: Midge Minium, MD;  Location: [facility] ENDOSCOPY;  Service: Endoscopy;  Laterality: N/A;   KNEE ARTHROPLASTY Left 10/24/2022   Procedure: COMPUTER ASSISTED TOTAL KNEE ARTHROPLASTY;  Surgeon: Donato Heinz, MD;  Location: [facility] ORS;  Service: Orthopedics;  Laterality: Left;   LEFT HEART CATH AND CORONARY ANGIOGRAPHY Right 11/14/2017   Procedure: LEFT HEART CATH AND CORONARY ANGIOGRAPHY;  Surgeon: Laurier Nancy, MD;  Location: [facility] INVASIVE CV LAB;  Service: Cardiovascular;  Laterality: Right;   POLYPECTOMY  01/04/2024   Procedure: POLYPECTOMY;  Surgeon: Midge Minium, MD;  Location: [facility] ENDOSCOPY;  Service: Endoscopy;;   SCROTAL EXPLORATION Left 11/24/2020   Procedure: SCROTUM EXPLORATION WITH LEFT ORCHIECTOMY;  Surgeon: Noel Christmas, MD;  Location: [facility] ORS;  Service: Urology;  Laterality: Left;   TONSILLECTOMY      Social History:   reports that he has never smoked. He has never used smokeless tobacco. He reports that he does not drink alcohol and does not use drugs.   Family History:  His family history includes Emphysema in his father and mother.   Allergies No Known Allergies   Home Medications  Prior to Admission medications   Medication Sig Start Date End Date Taking? Authorizing Provider  acetaminophen (TYLENOL) 325 MG tablet Take 2 tablets (650 mg total) by mouth every 6 (six) hours as needed for mild pain or headache (fever >/= 101). 01/13/21  Yes Lonia Blood, MD  albuterol (VENTOLIN HFA) 108 (90 Base) MCG/ACT inhaler Inhale 1 puff into the lungs every 4 (four) hours as needed. 10/27/21  Yes [provider]  amiodarone (PACERONE) 200 MG tablet Take 1 tablet (200 mg total) by mouth daily. 05/18/20  Yes Alford Highland, MD  amoxicillin-clavulanate (AUGMENTIN) 875-125 MG tablet Take 1 tablet by mouth 2 (two) times daily. 03/03/24 03/13/24 Yes [provider]  benzonatate (TESSALON) 200 MG capsule Take 200 mg by  mouth 3 (three) times daily as needed for cough. 03/03/24  Yes [provider]  doxycycline (VIBRA-TABS) 100 MG tablet Take 100 mg by mouth 2 (two) times daily. 03/03/24 03/13/24 Yes [provider]  famotidine (PEPCID) 20 MG tablet Take 1 tablet (20 mg total) by mouth 2 (two) times daily for 15 days. 05/07/23 03/11/24 Yes Sharman Cheek, MD  finasteride (PROSCAR) 5 MG tablet Take 1 tablet (5 mg total) by mouth daily. 05/18/20  Yes Wieting, Richard, MD  ipratropium (ATROVENT) 0.06 % nasal spray Place 2 sprays into the nose 3 (three) times daily as needed. 03/26/23 03/25/24 Yes [provider]  levothyroxine (SYNTHROID) 100 MCG tablet TAKE 1 TABLET BY MOUTH EVERY DAY IN THE MORNING 02/16/24  Yes Tejan-Sie, Marcelino Freestone, MD  PARoxetine (PAXIL) 20 MG tablet TAKE 1 TABLET BY MOUTH EVERY DAY 12/25/23  Yes Tejan-Sie, Marcelino Freestone, MD  rosuvastatin (CRESTOR) 10 MG tablet Take 1 tablet (10 mg total) by mouth daily. 07/28/23 07/27/24 Yes Sherron Monday, MD  spironolactone (ALDACTONE) 25 MG tablet Take 0.5 tablets (12.5 mg total) by mouth daily. 05/19/20  Yes Wieting, Richard, MD  tamsulosin (FLOMAX) 0.4 MG CAPS capsule Take 0.8 mg by mouth at bedtime.    Yes [provider]  cariprazine (VRAYLAR) 1.5 MG capsule Take 1 capsule (1.5 mg total) by mouth daily. Patient not taking: Reported on 03/11/2024 12/11/23 03/10/24  Sherron Monday, MD  cariprazine (VRAYLAR) 1.5 MG capsule Take 1 capsule (1.5 mg total) by mouth daily for 14 days. Patient not taking: Reported on 03/11/2024 12/11/23 12/25/23  Sherron Monday, MD  naproxen (NAPROSYN) 500 MG tablet Take 500 mg by mouth 2 (two) times daily as needed for mild pain. Patient not taking: Reported on 09/25/2023 02/22/23   [provider]  ondansetron (ZOFRAN-ODT) 4 MG disintegrating tablet Take 1 tablet (4 mg total) by mouth every 8 (eight) hours as needed for nausea or vomiting. Patient not taking: Reported on 09/25/2023 05/07/23   Sharman Cheek, MD  promethazine-dextromethorphan (PROMETHAZINE-DM) 6.25-15 MG/5ML syrup Take 5 mLs by mouth every 6 (six) hours as needed for cough. Patient not taking: Reported on 09/25/2023 03/26/23   [provider]     Critical care time: 85 minutes

## 2024-03-12 NOTE — Progress Notes (Signed)
 Critical care note  Date of note: 03/12/2024  Subjective: The patient has been on heated high flow nasal cannula since 2330 that was just increased to 55 L and has been having increased work of breathing with wheezes laterally and crackles.  He was given a DuoNeb with no significant improvement.  No chest pain or palpitations.  No nausea or vomiting or abdominal pain.  No fever or chills.  Patient was placed on BiPAP by RT.  Objective: Physical examination: Generally: Acutely ill elderly Caucasian male in moderate respiratory distress, currently on BiPAP. Vital signs: BP 151/64, with heart rate 65, respiratory rate of 26 and temperature 0.8 with 96% on 100% FiO2 on BiPAP. Head - atraumatic, normocephalic.  Pupils - equal, round and reactive to light and accommodation. Extraocular movements are intact. No scleral icterus.  Oropharynx - moist mucous membranes and tongue. No pharyngeal erythema or exudate.  Neck - supple. No JVD. Carotid pulses 2+ bilaterally. No carotid bruits. No palpable thyromegaly or lymphadenopathy. Cardiovascular - regular rate and rhythm. Normal S1 and S2. No murmurs, gallops or rubs.  Lungs -diminished bibasilar breath sounds with bibasal crackles with diffuse expiratory wheezes and rhonchi and diminished expiratory airflow with harsh vesicular breathing. Abdomen - soft and nontender. Positive bowel sounds. No palpable organomegaly or masses.  Extremities - no pitting edema, clubbing or cyanosis.  Neuro - grossly non-focal. Skin - no rashes. GU and rectal exam - deferred.   Labs and notes were reviewed.  Assessment/plan: 1.  Acute respiratory failure with hypoxia secondary to community-acquired pneumonia and likely suspected fluid overload with acute on chronic systolic CHF. - The patient was given nebulized DuoNeb - I ordered 40 mg of IV Lasix and 1 mg of IV morphine sulfate. - Stat portable chest x-ray is currently pending. - Stat labs including ABG, CBC, CMP,  D-dimer, BNP are pending. - The patient be transferred to stepdown unit bed. - I discussed the case with the ICU team.  Will continue monitoring him.  Authorized and performed by: Valente David, MD Total critical care time:   30     minutes. Due to a high probability of clinically significant, life-threatening deterioration, the patient required my highest level of preparedness to intervene emergently and I personally spent this critical care time directly and personally managing the patient.  This critical care time included obtaining a history, examining the patient, pulse oximetry, ordering and review of studies, arranging urgent treatment with development of management plan, evaluation of patient's response to treatment, frequent reassessment, and discussions with other providers. This critical care time was performed to assess and manage the high probability of imminent, life-threatening deterioration that could result in multiorgan failure.  It was exclusive of separately billable procedures and treating other patients and teaching time.

## 2024-03-12 NOTE — Significant Event (Signed)
 Night update  Patient with worsening confusion and restlessness. Remains on 100% bipap with oxygen saturations 83-87%. Trialed morphine and Precedex to attempt improve compliance on bipap without success. ABG PH 7.44 CO2 47 PO2 49 O2 sat 83.9. Decision was made to proceed with intubation. I discussed this with patients son and spouse over the phone, they are agreeable. Due to high risk of respiratory decline Dr. Belia Heman presented to the bedside for intubation, which went without any complications. Lasix 80 mg x 1 given. Spouse updated after procedure, all questions answered.

## 2024-03-12 NOTE — Significant Event (Signed)
 Rapid Response Event Note   Reason for Call :  Desaturation on 100% heated high flow nasal cannula  Initial Focused Assessment:  Patient with increased work of breathing. Patient Stating 89% on 100% heated high flow. Patient able to spech   Acute Respiratory Bundle implemented. Patient placed on Bipap and Then D4. Mansy arrived at bedside and took over the rapid. Patients BP on arrival  151/64 HR65.   Interventions:  -CXR -ABG -IV Morphine - IV Lasix's - Tx to stepdown   MD Notified: Dr Arville Care Call Time: 0425 Arrival Time:0430 End Time:0510  Judyann Munson, RN

## 2024-03-12 NOTE — Plan of Care (Signed)

## 2024-03-12 NOTE — Plan of Care (Signed)
  Problem: Education: Goal: Knowledge of General Education information will improve Description: Including pain rating scale, medication(s)/side effects and non-pharmacologic comfort measures Outcome: Progressing   Problem: Health Behavior/Discharge Planning: Goal: Ability to manage health-related needs will improve Outcome: Progressing   Problem: Coping: Goal: Level of anxiety will decrease Outcome: Progressing   Problem: Elimination: Goal: Will not experience complications related to bowel motility Outcome: Progressing Goal: Will not experience complications related to urinary retention Outcome: Progressing   Problem: Pain Managment: Goal: General experience of comfort will improve and/or be controlled Outcome: Progressing   Problem: Safety: Goal: Ability to remain free from injury will improve Outcome: Progressing   Problem: Skin Integrity: Goal: Risk for impaired skin integrity will decrease Outcome: Progressing   Problem: Clinical Measurements: Goal: Ability to maintain a body temperature in the normal range will improve Outcome: Progressing

## 2024-03-12 NOTE — Procedures (Cosign Needed Addendum)
 Intubation Procedure Note  JAWUAN ROBB  [id number]  [birth year]  Date:03/12/24  Time:10:16 PM   Provider Performing:Hale Chalfin Perlie Gold    Procedure: Intubation (31500)  Indication(s) Respiratory Failure  Consent Risks of the procedure as well as the alternatives and risks of each were explained to the patient and/or caregiver.  Consent for the procedure was obtained and is signed in the bedside chart   Anesthesia Etomidate, Fentanyl, and Rocuronium   Time Out Verified patient identification, verified procedure, site/side was marked, verified correct patient position, special equipment/implants available, medications/allergies/relevant history reviewed, required imaging and test results available.   Sterile Technique Usual hand hygeine, masks, and gloves were used   Procedure Description Patient positioned in bed supine.  Sedation given as noted above.  Patient was intubated with endotracheal tube using Glidescope.  View was Grade 1 full glottis .  Number of attempts was 1.  Colorimetric CO2 detector was consistent with tracheal placement. ETT at 25   Complications/Tolerance None; patient tolerated the procedure well. Chest X-ray is ordered to verify placement.   EBL None   Specimen(s) None

## 2024-03-13 ENCOUNTER — Inpatient Hospital Stay

## 2024-03-13 ENCOUNTER — Other Ambulatory Visit

## 2024-03-13 DIAGNOSIS — J9601 Acute respiratory failure with hypoxia: Secondary | ICD-10-CM | POA: Diagnosis not present

## 2024-03-13 DIAGNOSIS — J9602 Acute respiratory failure with hypercapnia: Secondary | ICD-10-CM

## 2024-03-13 LAB — ECHOCARDIOGRAM COMPLETE
Area-P 1/2: 2.53 cm2
Height: 72 in
S' Lateral: 3.1 cm
Weight: 3100.55 [oz_av]

## 2024-03-13 LAB — CBC
HCT: 40.2 % (ref 39.0–52.0)
Hemoglobin: 13.7 g/dL (ref 13.0–17.0)
MCH: 32.9 pg (ref 26.0–34.0)
MCHC: 34.1 g/dL (ref 30.0–36.0)
MCV: 96.6 fL (ref 80.0–100.0)
Platelets: 286 10*3/uL (ref 150–400)
RBC: 4.16 MIL/uL — ABNORMAL LOW (ref 4.22–5.81)
RDW: 12.9 % (ref 11.5–15.5)
WBC: 16.5 10*3/uL — ABNORMAL HIGH (ref 4.0–10.5)
nRBC: 0 % (ref 0.0–0.2)

## 2024-03-13 LAB — BASIC METABOLIC PANEL
Anion gap: 9 (ref 5–15)
BUN: 24 mg/dL — ABNORMAL HIGH (ref 8–23)
CO2: 28 mmol/L (ref 22–32)
Calcium: 8.2 mg/dL — ABNORMAL LOW (ref 8.9–10.3)
Chloride: 103 mmol/L (ref 98–111)
Creatinine, Ser: 0.95 mg/dL (ref 0.61–1.24)
GFR, Estimated: >60 mL/min (ref >60)
Glucose, Bld: 193 mg/dL — ABNORMAL HIGH (ref 70–99)
Potassium: 3.5 mmol/L (ref 3.5–5.1)
Sodium: 140 mmol/L (ref 135–145)

## 2024-03-13 LAB — BLOOD GAS, ARTERIAL
Acid-Base Excess: 5.6 mmol/L — ABNORMAL HIGH (ref 0.0–2.0)
Acid-Base Excess: 6.5 mmol/L — ABNORMAL HIGH (ref 0.0–2.0)
Bicarbonate: 33.1 mmol/L — ABNORMAL HIGH (ref 20.0–28.0)
Bicarbonate: 34.2 mmol/L — ABNORMAL HIGH (ref 20.0–28.0)
FIO2: 80 %
FIO2: 80 %
MECHVT: 500 mL
MECHVT: 500 mL
Mechanical Rate: 24
Mechanical Rate: 24
O2 Saturation: 93.6 %
O2 Saturation: 93.4 %
PEEP: 10 cmH2O
PEEP: 10 cmH2O
Patient temperature: 37
Patient temperature: 37
pCO2 arterial: 60 mmHg — ABNORMAL HIGH (ref 32–48)
pCO2 arterial: 62 mmHg — ABNORMAL HIGH (ref 32–48)
pH, Arterial: 7.35 (ref 7.35–7.45)
pH, Arterial: 7.35 (ref 7.35–7.45)
pO2, Arterial: 68 mmHg — ABNORMAL LOW (ref 83–108)
pO2, Arterial: 73 mmHg — ABNORMAL LOW (ref 83–108)

## 2024-03-13 LAB — TRIGLYCERIDES: Triglycerides: 117 mg/dL (ref <150)

## 2024-03-13 LAB — MAGNESIUM: Magnesium: 2.3 mg/dL (ref 1.7–2.4)

## 2024-03-13 LAB — PHOSPHORUS: Phosphorus: 4 mg/dL (ref 2.5–4.6)

## 2024-03-13 LAB — GLUCOSE, CAPILLARY: Glucose-Capillary: 189 mg/dL — ABNORMAL HIGH (ref 70–99)

## 2024-03-13 MED ORDER — PROSOURCE TF20 ENFIT COMPATIBL EN LIQD
60.0000 mL | Freq: Every day | ENTERAL | Status: DC
  Administered 2024-03-14 – 2024-03-20 (×7): 60 mL

## 2024-03-13 MED ORDER — PAROXETINE HCL 20 MG PO TABS
20.0000 mg | ORAL_TABLET | Freq: Every day | ORAL | Status: DC
Start: 2024-03-13 — End: 2024-03-24
  Administered 2024-03-13 – 2024-03-23 (×11): 20 mg
  Filled 2024-03-13 (×12): qty 1

## 2024-03-13 MED ORDER — ACETAMINOPHEN 650 MG RE SUPP: 650.0000 mg | Freq: Four times a day (QID) | RECTAL | Status: DC | PRN

## 2024-03-13 MED ORDER — FREE WATER
100.0000 mL | Status: DC
  Administered 2024-03-13 – 2024-03-20 (×39): 100 mL

## 2024-03-13 MED ORDER — MAGNESIUM HYDROXIDE 400 MG/5ML PO SUSP
30.0000 mL | Freq: Every day | ORAL | Status: DC | PRN
Start: 2024-03-13 — End: 2024-03-19
  Administered 2024-03-14 – 2024-03-17 (×3): 30 mL
  Filled 2024-03-13 (×3): qty 30

## 2024-03-13 MED ORDER — HYDROCOD POLI-CHLORPHE POLI ER 10-8 MG/5ML PO SUER: 5.0000 mL | Freq: Two times a day (BID) | ORAL | Status: DC | PRN

## 2024-03-13 MED ORDER — INSULIN ASPART 100 UNIT/ML IJ SOLN
0.0000 [IU] | INTRAMUSCULAR | Status: DC
  Administered 2024-03-13 – 2024-03-14 (×2): 3 [IU] via SUBCUTANEOUS
  Administered 2024-03-14: 2 [IU] via SUBCUTANEOUS
  Administered 2024-03-14 (×2): 3 [IU] via SUBCUTANEOUS
  Administered 2024-03-14: 5 [IU] via SUBCUTANEOUS
  Administered 2024-03-14: 3 [IU] via SUBCUTANEOUS
  Administered 2024-03-15 (×2): 5 [IU] via SUBCUTANEOUS
  Administered 2024-03-15 (×3): 3 [IU] via SUBCUTANEOUS
  Administered 2024-03-15 – 2024-03-16 (×2): 5 [IU] via SUBCUTANEOUS
  Filled 2024-03-13 (×14): qty 1

## 2024-03-13 MED ORDER — LEVOTHYROXINE SODIUM 100 MCG PO TABS
100.0000 ug | ORAL_TABLET | Freq: Every day | ORAL | Status: DC
  Administered 2024-03-14 – 2024-03-24 (×11): 100 ug
  Filled 2024-03-13 (×11): qty 1

## 2024-03-13 MED ORDER — ONDANSETRON HCL 4 MG/2ML IJ SOLN
4.0000 mg | Freq: Four times a day (QID) | INTRAMUSCULAR | Status: DC | PRN
Start: 2024-03-13 — End: 2024-03-24

## 2024-03-13 MED ORDER — ROSUVASTATIN CALCIUM 10 MG PO TABS
10.0000 mg | ORAL_TABLET | Freq: Every day | ORAL | Status: DC
  Administered 2024-03-13 – 2024-03-23 (×11): 10 mg
  Filled 2024-03-13 (×11): qty 1

## 2024-03-13 MED ORDER — ONDANSETRON HCL 4 MG PO TABS
4.0000 mg | ORAL_TABLET | Freq: Four times a day (QID) | ORAL | Status: DC | PRN
Start: 2024-03-13 — End: 2024-03-24

## 2024-03-13 MED ORDER — POTASSIUM CHLORIDE 10 MEQ/100ML IV SOLN
10.0000 meq | INTRAVENOUS | Status: AC
  Administered 2024-03-13 (×2): 10 meq via INTRAVENOUS
  Filled 2024-03-13 (×2): qty 100

## 2024-03-13 MED ORDER — ACETAMINOPHEN 325 MG PO TABS
650.0000 mg | ORAL_TABLET | Freq: Four times a day (QID) | ORAL | Status: DC | PRN
  Administered 2024-03-22 – 2024-03-23 (×2): 650 mg
  Filled 2024-03-13 (×2): qty 2

## 2024-03-13 MED ORDER — JUVEN PO PACK
1.0000 | PACK | Freq: Two times a day (BID) | ORAL | Status: DC
  Administered 2024-03-14 – 2024-03-23 (×18): 1

## 2024-03-13 MED ORDER — GUAIFENESIN 100 MG/5ML PO LIQD
400.0000 mg | Freq: Two times a day (BID) | ORAL | Status: DC
Start: 2024-03-13 — End: 2024-03-18
  Administered 2024-03-13 – 2024-03-18 (×12): 400 mg
  Filled 2024-03-13: qty 40
  Filled 2024-03-13 (×4): qty 20
  Filled 2024-03-13: qty 10
  Filled 2024-03-13 (×3): qty 20
  Filled 2024-03-13 (×2): qty 10
  Filled 2024-03-13 (×2): qty 20

## 2024-03-13 MED ORDER — AMIODARONE HCL 200 MG PO TABS
200.0000 mg | ORAL_TABLET | Freq: Every day | ORAL | Status: DC
  Administered 2024-03-16 – 2024-03-23 (×8): 200 mg
  Filled 2024-03-13 (×10): qty 1

## 2024-03-13 MED ORDER — TRAZODONE HCL 50 MG PO TABS: 25.0000 mg | ORAL_TABLET | Freq: Every evening | ORAL | Status: DC | PRN

## 2024-03-13 MED ORDER — VITAL AF 1.2 CAL PO LIQD
1000.0000 mL | ORAL | Status: DC
  Administered 2024-03-13 – 2024-03-20 (×10): 1000 mL

## 2024-03-13 MED ORDER — SPIRONOLACTONE 12.5 MG HALF TABLET
12.5000 mg | ORAL_TABLET | Freq: Every day | ORAL | Status: DC
  Administered 2024-03-14 – 2024-03-15 (×2): 12.5 mg
  Filled 2024-03-13 (×5): qty 1

## 2024-03-13 MED ORDER — FAMOTIDINE 20 MG PO TABS
20.0000 mg | ORAL_TABLET | Freq: Two times a day (BID) | ORAL | Status: DC
  Administered 2024-03-13 – 2024-03-18 (×11): 20 mg
  Filled 2024-03-13 (×11): qty 1

## 2024-03-13 NOTE — Progress Notes (Signed)
 NAME:  Nathaniel Ibarra, MRN:  [id number], DOB:  February 28, 1941, LOS: 3 ADMISSION DATE:  03/10/2024, CONSULTATION DATE:  03/12/24 REFERRING MD:  Valente David CHIEF COMPLAINT:  Respiratory Failure   History of Present Illness:  Nathaniel Ibarra is an 83 yo with a PMH significant for HTN, HLD, HFrEF, CM, CAD, Oxygen dependent 4 Liters (which he rarely uses), GERD, BPH, Depression, Hypothyroidism that presented to the ER with worsening dyspnea over the past week. To review, patient tested positive for Influenza A with associated PNA 03/03/24 and completed a course of Tamiflu, prednisone taper, and antibiotics. Upon evaluation in the ER he was afebrile, 97% on 8 Liters, CXR low lung volumes with bronchitic changes and questionable developing right midlung zone and left mid to lower lung zone airspace opacities. He was started on antibiotics and HFNC, cultures sent. Overnight a RAPID was called for increased work of breathing.  Was placed on 100% bipap, lasix 40 mg x 1, Morphine, ABG, CXR, D-dimer sent.   PCCM was consulted for medical management of patients Respiratory failure  Pertinent  Medical History  HTN, HLD, HFrEF, CM, CAD, Oxygen dependent 4 Liters (which he rarely uses), GERD, BPH, Depression, Hypothyroidism  Significant Hospital Events: Including procedures, antibiotic start and stop dates in addition to other pertinent events   3/24: Admitted to the hospitalist service with CAP w/ respiratory failure. Abx and HFNC. Rapid response, Bipap 100% and transferred to the ICU 3/26 remains on vent, severe hypoxia  Interim History / Subjective:  Remains critically ill Remains intubated Severe hypoxia Requires VENT support for survival   Objective   Blood pressure (!) 123/52, pulse (!) 55, temperature 97.9 F (36.6 C), temperature source Axillary, resp. rate (!) 24, height 6' (1.829 m), weight 80.5 kg, SpO2 90%.    Vent Mode: PRVC FiO2 (%):  [80 %-100 %] 80 % Set Rate:  [20 bmp-24 bmp] 24 bmp Vt  Set:  [500 mL] 500 mL PEEP:  [10 cmH20-15 cmH20] 10 cmH20 Plateau Pressure:  [14 cmH20-40 cmH20] 14 cmH20   Intake/Output Summary (Last 24 hours) at 03/13/2024 0829 Last data filed at 03/13/2024 0701 Gross per 24 hour  Intake 645.69 ml  Output 2230 ml  Net -1584.31 ml   Filed Weights   03/12/24 0100 03/12/24 0515 03/13/24 0350  Weight: 86.6 kg 87.9 kg 80.5 kg    REVIEW OF SYSTEMS  PATIENT IS UNABLE TO PROVIDE COMPLETE REVIEW OF SYSTEMS DUE TO SEVERE CRITICAL ILLNESS   PHYSICAL EXAMINATION:  GENERAL:critically ill appearing, +resp distress EYES: Pupils equal, round, reactive to light.  No scleral icterus.  MOUTH: Moist mucosal membrane. INTUBATED NECK: Supple.  PULMONARY: Lungs clear to auscultation, +rhonchi, +wheezing CARDIOVASCULAR: S1 and S2.  Regular rate and rhythm GASTROINTESTINAL: Soft, nontender, -distended. Positive bowel sounds.  MUSCULOSKELETAL: No swelling, clubbing, or edema.  NEUROLOGIC: obtunded,sedated SKIN:normal, warm to touch, Capillary refill delayed  Pulses present bilaterally    Assessment & Plan:   83 yo white male with end stage lung disease with acute severe hypoxic resp failure  Due to Community Acquired PNA,Influenza A  Severe ACUTE Hypoxic and Hypercapnic Respiratory Failure -continue Mechanical Ventilator support -Wean Fio2 and PEEP as tolerated -VAP/VENT bundle implementation - Wean PEEP & FiO2 as tolerated, maintain SpO2 > 88% - Head of bed elevated 30 degrees, VAP protocol in place - Plateau pressures less than 30 cm H20  - Intermittent chest x-ray & ABG PRN - Ensure adequate pulmonary hygiene  Unable to wean due to severe hypoxia  INFECTIOUS  DISEASE -continue antibiotics as prescribed -follow up cultures -Solumedrol 40 mg Q 12 hours -Azithromycin + Ceftriaxone   Paroxysmal atrial fibrillation HFrEF, 3/23 EF 55% -Continue amiodarone and aldactone -Consider repeat Echo  RENAL -continue Foley Catheter-assess need -Avoid  nephrotoxic agents -Follow urine output, BMP -Ensure adequate renal perfusion, optimize oxygenation -Renal dose medications   Intake/Output Summary (Last 24 hours) at 03/13/2024 0831 Last data filed at 03/13/2024 0701 Gross per 24 hour  Intake 645.69 ml  Output 2230 ml  Net -1584.31 ml      Latest Ref Rng & Units 03/13/2024    4:27 AM 03/12/2024    6:04 AM 03/12/2024    4:31 AM  BMP  Glucose 70 - 99 mg/dL 161  096  045   BUN 8 - 23 mg/dL 24  17  16    Creatinine 0.61 - 1.24 mg/dL 4.09  8.11  9.14   Sodium 135 - 145 mmol/L 140  137  136   Potassium 3.5 - 5.1 mmol/L 3.5  3.8  4.0   Chloride 98 - 111 mmol/L 103  102  103   CO2 22 - 32 mmol/L 28  28  27    Calcium 8.9 - 10.3 mg/dL 8.2  8.1  7.9     NEUROLOGY -need for sedation -Goal RASS -2 to -3   ENDO - ICU hypoglycemic\Hyperglycemia protocol -check FSBS per protocol   GI GI PROPHYLAXIS as indicated NUTRITIONAL STATUS DIET-->TF's as tolerated Constipation protocol as indicated   ELECTROLYTES -follow labs as needed -replace as needed -pharmacy consultation and following  RESTRICTIVE TRANSFUSION PROTOCOL TRANSFUSION  IF HGB<7  or ACTIVE BLEEDING OR DX of ACUTE CORONARY SYNDROMES       Best Practice (right click and "Reselect all SmartList Selections" daily)   Diet/type: NPO w/ oral meds pending improved respiratory status DVT prophylaxis SCD, Lovenox Pressure ulcer(s): N/A GI prophylaxis: Pepcid Lines: N/A Foley:  N/A Code Status:  full code Last date of multidisciplinary goals of care discussion [Patient updated]  Labs   CBC: Recent Labs  Lab 03/10/24 2037 03/11/24 0516 03/12/24 0431 03/12/24 0604 03/13/24 0427  WBC 10.0 8.3 11.6* 12.0* 16.5*  NEUTROABS 8.3*  --   --   --   --   HGB 13.6 12.5* 12.9* 13.5 13.7  HCT 40.6 38.2* 39.8 41.1 40.2  MCV 95.8 98.5 98.3 97.6 96.6  PLT 286 237 258 265 286   Basic Metabolic Panel: Recent Labs  Lab 03/10/24 2037 03/11/24 0516 03/12/24 0431  03/12/24 0604 03/13/24 0427  NA 137 140 136 137 140  K 3.8 3.7 4.0 3.8 3.5  CL 101 103 103 102 103  CO2 28 27 27 28 28   GLUCOSE 151* 99 133* 128* 193*  BUN 35* 28* 16 17 24*  CREATININE 0.87 0.73 0.67 0.71 0.95  CALCIUM 8.3* 7.9* 7.9* 8.1* 8.2*  MG  --   --   --  1.9 2.3  PHOS  --   --   --  2.6 4.0   GFR: Estimated Creatinine Clearance: 65.8 mL/min (by C-G formula based on SCr of 0.95 mg/dL). Recent Labs  Lab 03/10/24 2037 03/11/24 0516 03/12/24 0431 03/12/24 0604 03/13/24 0427  PROCALCITON 0.10  --   --  <0.10  --   WBC 10.0 8.3 11.6* 12.0* 16.5*  LATICACIDVEN  --   --   --  1.2  --    ABG    Component Value Date/Time   PHART 7.35 03/13/2024 0322   PCO2ART 60 (H) 03/13/2024  0322   PO2ART 68 (L) 03/13/2024 0322   HCO3 33.1 (H) 03/13/2024 0322   TCO2 26 09/07/2007 1236   O2SAT 93.6 03/13/2024 0322    HbA1C: Hgb A1c MFr Bld  Date/Time Value Ref Range Status  07/26/2023 09:57 AM 6.1 (H) 4.8 - 5.6 % Final    Comment:             Prediabetes: 5.7 - 6.4          Diabetes: >6.4          Glycemic control for adults with diabetes: <7.0   12/30/2020 04:17 AM 5.5 4.8 - 5.6 % Final    Comment:    (NOTE) Pre diabetes:          5.7%-6.4%  Diabetes:              >6.4%  Glycemic control for   <7.0% adults with diabetes    ABG: Recent Labs  Lab 03/12/24 0501  GLUCAP 124*     DVT/GI PRX  assessed I Assessed the need for Labs I Assessed the need for Foley I Assessed the need for Central Venous Line Family Discussion when available I Assessed the need for Mobilization I made an Assessment of medications to be adjusted accordingly Safety Risk assessment completed  CASE DISCUSSED IN MULTIDISCIPLINARY ROUNDS WITH ICU TEAM     Critical Care Time devoted to patient care services described in this note is 65 minutes.  Critical care was necessary to treat /prevent imminent and life-threatening deterioration. Overall, patient is critically ill, prognosis is  guarded.   Lucie Leather, M.D.  [facility]  Medical Director [facility] Medical Director [facility] Cardio-Pulmonary Department

## 2024-03-13 NOTE — Progress Notes (Addendum)
 Initial Nutrition Assessment  DOCUMENTATION CODES:   Non-severe (moderate) malnutrition in context of chronic illness  INTERVENTION:   Vital 1.2@60ml /hr- Initiate at 82ml/hr and increase by 59ml/hr q 8 hours until goal rate is reached.   ProSource TF 20- Give 60ml daily via tube, each supplement provides 80kcal and 20g of protein.   Free water flushes q4 hours   Regimen provides 1808kcal/day, 128g/day protein and 1719ml/day of free water.   Pt at high refeed risk; recommend monitor potassium, magnesium and phosphorus labs daily until stable  Juven Fruit Punch BID via tube, each serving provides 95kcal and 2.5g of protein (amino acids glutamine and arginine)  Daily weights   NUTRITION DIAGNOSIS:   Moderate Malnutrition related to chronic illness as evidenced by mild fat depletion, moderate fat depletion, mild muscle depletion, severe muscle depletion.  GOAL:   Provide needs based on ASPEN/SCCM guidelines  MONITOR:   Vent status, Labs, Weight trends, TF tolerance, I & O's, Skin  REASON FOR ASSESSMENT:   Ventilator    ASSESSMENT:   83 y/o male with h/o BPH, hypothyroidism, PAF, depression, HLD, CAD, CHF, GERD, HTN, bladder cancer and recent flu A who is admitted with CAP.  Pt sedated and ventilated. OGT in place. Will plan to initiate tube feeds today. Pt is likely at refeed risk. Per chart, pt appears to be down ~21lbs(11%) over the past 3 months; this is significant weight loss.   Medications reviewed and include: colace, lovenox, pepcid, lasix, synthroid, solu-medrol, paxil, aldactone, miralax, azithromycin, ceftriaxone, KCl, propofol  Labs reviewed: K 3.5 wnl, BUN 24(H), P 4.0 wnl, Mg 2.3 wnl BNP- 599.3(H)- 3/25 Wbc- 16.5(H) AIC- 6.1(H)- 07/2023  Patient is currently intubated on ventilator support MV: 12.1 L/min Temp (24hrs), Avg:97.9 F (36.6 C), Min:97.1 F (36.2 C), Max:98.6 F (37 C)  Propofol: 10.55 ml/hr- provides 278kcal/day   MAP >18mmHg    UOP-   NUTRITION - FOCUSED PHYSICAL EXAM:  Flowsheet Row Most Recent Value  Orbital Region Mild depletion  Upper Arm Region Moderate depletion  Thoracic and Lumbar Region Mild depletion  Buccal Region Mild depletion  Temple Region Mild depletion  Clavicle Bone Region Mild depletion  Clavicle and Acromion Bone Region Mild depletion  Scapular Bone Region Mild depletion  Dorsal Hand Unable to assess  Patellar Region Severe depletion  Anterior Thigh Region Severe depletion  Posterior Calf Region Severe depletion  Edema (RD Assessment) None  Hair Reviewed  Eyes Reviewed  Mouth Reviewed  Skin Reviewed  Nails Reviewed   Diet Order:   Diet Order             Diet NPO time specified  Diet effective now                  EDUCATION NEEDS:   No education needs have been identified at this time  Skin:  Skin Assessment: Reviewed RN Assessment (Stage I sacrum)  Last BM:  3/24  Height:   Ht Readings from Last 1 Encounters:  03/12/24 6' (1.829 m)    Weight:   Wt Readings from Last 1 Encounters:  03/13/24 80.5 kg    Ideal Body Weight:  80.9 kg  BMI:  Body mass index is 24.07 kg/m.  Estimated Nutritional Needs:   Kcal:  1828kcal/day  Protein:  120-135g/day  Fluid:  2.0-2.3L/day  Betsey Holiday MS, RD, LDN If unable to be reached, please send secure chat to "RD inpatient" available from 8:00a-4:00p daily

## 2024-03-13 NOTE — Plan of Care (Signed)
  Problem: Education: Goal: Knowledge of General Education information will improve Description: Including pain rating scale, medication(s)/side effects and non-pharmacologic comfort measures Outcome: Progressing   Problem: Health Behavior/Discharge Planning: Goal: Ability to manage health-related needs will improve Outcome: Progressing   Problem: Clinical Measurements: Goal: Ability to maintain clinical measurements within normal limits will improve Outcome: Progressing Goal: Will remain free from infection Outcome: Progressing Goal: Diagnostic test results will improve Outcome: Progressing Goal: Respiratory complications will improve Outcome: Progressing Goal: Cardiovascular complication will be avoided Outcome: Progressing   Problem: Activity: Goal: Risk for activity intolerance will decrease Outcome: Progressing   Problem: Nutrition: Goal: Adequate nutrition will be maintained Outcome: Progressing   Problem: Coping: Goal: Level of anxiety will decrease Outcome: Progressing   Problem: Elimination: Goal: Will not experience complications related to bowel motility Outcome: Progressing Goal: Will not experience complications related to urinary retention Outcome: Progressing   Problem: Pain Managment: Goal: General experience of comfort will improve and/or be controlled Outcome: Progressing   Problem: Safety: Goal: Ability to remain free from injury will improve Outcome: Progressing   Problem: Skin Integrity: Goal: Risk for impaired skin integrity will decrease Outcome: Progressing   Problem: Activity: Goal: Ability to tolerate increased activity will improve Outcome: Progressing   Problem: Clinical Measurements: Goal: Ability to maintain a body temperature in the normal range will improve Outcome: Progressing   Problem: Respiratory: Goal: Ability to maintain adequate ventilation will improve Outcome: Progressing Goal: Ability to maintain a clear airway  will improve Outcome: Progressing   Problem: Activity: Goal: Ability to tolerate increased activity will improve Outcome: Progressing   Problem: Respiratory: Goal: Ability to maintain a clear airway and adequate ventilation will improve Outcome: Progressing

## 2024-03-14 ENCOUNTER — Inpatient Hospital Stay

## 2024-03-14 ENCOUNTER — Encounter: Payer: Self-pay | Admitting: Internal Medicine

## 2024-03-14 DIAGNOSIS — J09X1 Influenza due to identified novel influenza A virus with pneumonia: Secondary | ICD-10-CM | POA: Diagnosis not present

## 2024-03-14 DIAGNOSIS — J9602 Acute respiratory failure with hypercapnia: Secondary | ICD-10-CM | POA: Diagnosis not present

## 2024-03-14 DIAGNOSIS — J9601 Acute respiratory failure with hypoxia: Secondary | ICD-10-CM | POA: Diagnosis not present

## 2024-03-14 DIAGNOSIS — E440 Moderate protein-calorie malnutrition: Secondary | ICD-10-CM | POA: Insufficient documentation

## 2024-03-14 LAB — CBC
HCT: 37.3 % — ABNORMAL LOW (ref 39.0–52.0)
Hemoglobin: 12.3 g/dL — ABNORMAL LOW (ref 13.0–17.0)
MCH: 31.5 pg (ref 26.0–34.0)
MCHC: 33 g/dL (ref 30.0–36.0)
MCV: 95.6 fL (ref 80.0–100.0)
Platelets: 307 10*3/uL (ref 150–400)
RBC: 3.9 MIL/uL — ABNORMAL LOW (ref 4.22–5.81)
RDW: 13.2 % (ref 11.5–15.5)
WBC: 20.3 10*3/uL — ABNORMAL HIGH (ref 4.0–10.5)
nRBC: 0 % (ref 0.0–0.2)

## 2024-03-14 LAB — BASIC METABOLIC PANEL WITH GFR
Anion gap: 8 (ref 5–15)
BUN: 37 mg/dL — ABNORMAL HIGH (ref 8–23)
CO2: 31 mmol/L (ref 22–32)
Calcium: 8.3 mg/dL — ABNORMAL LOW (ref 8.9–10.3)
Chloride: 99 mmol/L (ref 98–111)
Creatinine, Ser: 0.86 mg/dL (ref 0.61–1.24)
GFR, Estimated: >60 mL/min (ref >60)
Glucose, Bld: 179 mg/dL — ABNORMAL HIGH (ref 70–99)
Potassium: 3.7 mmol/L (ref 3.5–5.1)
Sodium: 138 mmol/L (ref 135–145)

## 2024-03-14 LAB — BLOOD GAS, ARTERIAL
Acid-Base Excess: 9.7 mmol/L — ABNORMAL HIGH (ref 0.0–2.0)
Bicarbonate: 37.3 mmol/L — ABNORMAL HIGH (ref 20.0–28.0)
FIO2: 90 %
MECHVT: 500 mL
Mechanical Rate: 15
O2 Saturation: 96.5 %
PEEP: 8 cmH2O
Patient temperature: 37
pCO2 arterial: 63 mmHg — ABNORMAL HIGH (ref 32–48)
pH, Arterial: 7.38 (ref 7.35–7.45)
pO2, Arterial: 128 mmHg — ABNORMAL HIGH (ref 83–108)

## 2024-03-14 LAB — GLUCOSE, CAPILLARY
Glucose-Capillary: 196 mg/dL — ABNORMAL HIGH (ref 70–99)
Glucose-Capillary: 183 mg/dL — ABNORMAL HIGH (ref 70–99)
Glucose-Capillary: 204 mg/dL — ABNORMAL HIGH (ref 70–99)
Glucose-Capillary: 141 mg/dL — ABNORMAL HIGH (ref 70–99)
Glucose-Capillary: 176 mg/dL — ABNORMAL HIGH (ref 70–99)
Glucose-Capillary: 182 mg/dL — ABNORMAL HIGH (ref 70–99)

## 2024-03-14 LAB — MAGNESIUM: Magnesium: 2.6 mg/dL — ABNORMAL HIGH (ref 1.7–2.4)

## 2024-03-14 LAB — PHOSPHORUS: Phosphorus: 2.6 mg/dL (ref 2.5–4.6)

## 2024-03-14 MED ORDER — IOHEXOL 350 MG/ML SOLN
75.0000 mL | Freq: Once | INTRAVENOUS | Status: AC | PRN
  Administered 2024-03-14: 75 mL via INTRAVENOUS

## 2024-03-14 NOTE — Plan of Care (Signed)
  Problem: Education: Goal: Knowledge of General Education information will improve Description: Including pain rating scale, medication(s)/side effects and non-pharmacologic comfort measures Outcome: Progressing   Problem: Health Behavior/Discharge Planning: Goal: Ability to manage health-related needs will improve Outcome: Progressing   Problem: Clinical Measurements: Goal: Ability to maintain clinical measurements within normal limits will improve Outcome: Progressing Goal: Will remain free from infection Outcome: Progressing Goal: Diagnostic test results will improve Outcome: Progressing Goal: Respiratory complications will improve Outcome: Progressing Goal: Cardiovascular complication will be avoided Outcome: Progressing   Problem: Activity: Goal: Risk for activity intolerance will decrease Outcome: Progressing   Problem: Nutrition: Goal: Adequate nutrition will be maintained Outcome: Progressing   Problem: Coping: Goal: Level of anxiety will decrease Outcome: Progressing   Problem: Elimination: Goal: Will not experience complications related to bowel motility Outcome: Progressing Goal: Will not experience complications related to urinary retention Outcome: Progressing   Problem: Pain Managment: Goal: General experience of comfort will improve and/or be controlled Outcome: Progressing   Problem: Safety: Goal: Ability to remain free from injury will improve Outcome: Progressing   Problem: Skin Integrity: Goal: Risk for impaired skin integrity will decrease Outcome: Progressing   Problem: Activity: Goal: Ability to tolerate increased activity will improve Outcome: Progressing   Problem: Clinical Measurements: Goal: Ability to maintain a body temperature in the normal range will improve Outcome: Progressing   Problem: Respiratory: Goal: Ability to maintain adequate ventilation will improve Outcome: Progressing Goal: Ability to maintain a clear airway  will improve Outcome: Progressing   Problem: Activity: Goal: Ability to tolerate increased activity will improve Outcome: Progressing   Problem: Respiratory: Goal: Ability to maintain a clear airway and adequate ventilation will improve Outcome: Progressing

## 2024-03-14 NOTE — Progress Notes (Signed)
 NAME:  Nathaniel Ibarra, MRN:  [id number], DOB:  1941-10-06, LOS: 4 ADMISSION DATE:  03/10/2024, CONSULTATION DATE:  03/12/24 REFERRING MD:  Valente David CHIEF COMPLAINT:  Respiratory Failure   History of Present Illness:  Nathaniel Ibarra is an 83 yo with a PMH significant for HTN, HLD, HFrEF, CM, CAD, Oxygen dependent 4 Liters (which he rarely uses), GERD, BPH, Depression, Hypothyroidism that presented to the ER with worsening dyspnea over the past week. To review, patient tested positive for Influenza A with associated PNA 03/03/24 and completed a course of Tamiflu, prednisone taper, and antibiotics. Upon evaluation in the ER he was afebrile, 97% on 8 Liters, CXR low lung volumes with bronchitic changes and questionable developing right midlung zone and left mid to lower lung zone airspace opacities. He was started on antibiotics and HFNC, cultures sent. Overnight a RAPID was called for increased work of breathing.  Was placed on 100% bipap, lasix 40 mg x 1, Morphine, ABG, CXR, D-dimer sent.   PCCM was consulted for medical management of patients Respiratory failure  Pertinent  Medical History  HTN, HLD, HFrEF, CM, CAD, Oxygen dependent 4 Liters (which he rarely uses), GERD, BPH, Depression, Hypothyroidism  Significant Hospital Events: Including procedures, antibiotic start and stop dates in addition to other pertinent events   3/24: Admitted to the hospitalist service with CAP w/ respiratory failure. Abx and HFNC. Rapid response, Bipap 100% and transferred to the ICU 3/26 remains on vent, severe hypoxia 3/27 remains on vent severe hypoxia   Interim History / Subjective:  Remains critically ill Remains intubated Severe hypoxia Requires VENT support for survival  Vent Mode: PRVC FiO2 (%):  [80 %] 80 % Set Rate:  [24 bmp] 24 bmp Vt Set:  [500 mL] 500 mL PEEP:  [10 cmH20] 10 cmH20 Plateau Pressure:  [11 cmH20-25 cmH20] 25 cmH20   Objective   Blood pressure (!) 132/49, pulse (!) 54,  temperature (!) 97.2 F (36.2 C), temperature source Axillary, resp. rate (!) 24, height 6' (1.829 m), weight 80.5 kg, SpO2 96%.    Vent Mode: PRVC FiO2 (%):  [80 %] 80 % Set Rate:  [24 bmp] 24 bmp Vt Set:  [500 mL] 500 mL PEEP:  [10 cmH20] 10 cmH20 Plateau Pressure:  [11 cmH20-25 cmH20] 25 cmH20   Intake/Output Summary (Last 24 hours) at 03/14/2024 0752 Last data filed at 03/14/2024 4098 Gross per 24 hour  Intake 1385.27 ml  Output 848 ml  Net 537.27 ml   Filed Weights   03/12/24 0100 03/12/24 0515 03/13/24 0350  Weight: 86.6 kg 87.9 kg 80.5 kg     REVIEW OF SYSTEMS  PATIENT IS UNABLE TO PROVIDE COMPLETE REVIEW OF SYSTEMS DUE TO SEVERE CRITICAL ILLNESS   PHYSICAL EXAMINATION:  GENERAL:critically ill appearing, +resp distress EYES: Pupils equal, round, reactive to light.  No scleral icterus.  MOUTH: Moist mucosal membrane. INTUBATED NECK: Supple.  PULMONARY: Lungs clear to auscultation, +rhonchi, +wheezing CARDIOVASCULAR: S1 and S2.  Regular rate and rhythm GASTROINTESTINAL: Soft, nontender, -distended. Positive bowel sounds.  MUSCULOSKELETAL: No swelling, clubbing, or edema.  NEUROLOGIC: obtunded,sedated SKIN:normal, warm to touch, Capillary refill delayed  Pulses present bilaterally    Assessment & Plan:   83 yo white male with end stage lung disease with acute severe hypoxic resp failure ,Due to Community Acquired PNA,Influenza A with ALI  Severe ACUTE Hypoxic and Hypercapnic Respiratory Failure -continue Mechanical Ventilator support -Wean Fio2 and PEEP as tolerated -VAP/VENT bundle implementation - Wean PEEP & FiO2 as tolerated,  maintain SpO2 > 88% - Head of bed elevated 30 degrees, VAP protocol in place - Plateau pressures less than 30 cm H20  - Intermittent chest x-ray & ABG PRN - Ensure adequate pulmonary hygiene  Unable to wean due to severe hypoxia Severe HYPOXIA will need to consider CT chest assess for PE   INFECTIOUS DISEASE -continue  antibiotics as prescribed -follow up cultures -Solumedrol 40 mg Q 12 hours -Azithromycin + Ceftriaxone    Paroxysmal atrial fibrillation HFrEF, 3/23 EF 55% -Continue amiodarone and aldactone -Consider repeat Echo  RENAL -continue Foley Catheter-assess need -Avoid nephrotoxic agents -Follow urine output, BMP -Ensure adequate renal perfusion, optimize oxygenation -Renal dose medications   Intake/Output Summary (Last 24 hours) at 03/14/2024 0752 Last data filed at 03/14/2024 1914 Gross per 24 hour  Intake 1385.27 ml  Output 848 ml  Net 537.27 ml      Latest Ref Rng & Units 03/14/2024    5:48 AM 03/13/2024    4:27 AM 03/12/2024    6:04 AM  BMP  Glucose 70 - 99 mg/dL 782  956  213   BUN 8 - 23 mg/dL 37  24  17   Creatinine 0.61 - 1.24 mg/dL 0.86  5.78  4.69   Sodium 135 - 145 mmol/L 138  140  137   Potassium 3.5 - 5.1 mmol/L 3.7  3.5  3.8   Chloride 98 - 111 mmol/L 99  103  102   CO2 22 - 32 mmol/L 31  28  28    Calcium 8.9 - 10.3 mg/dL 8.3  8.2  8.1     NEUROLOGY ACUTE METABOLIC ENCEPHALOPATHY -need for sedation -Goal RASS -2 to -3   ENDO - ICU hypoglycemic\Hyperglycemia protocol -check FSBS per protocol   GI GI PROPHYLAXIS as indicated NUTRITIONAL STATUS DIET-->TF's as tolerated Constipation protocol as indicated   ELECTROLYTES -follow labs as needed -replace as needed -pharmacy consultation and following  RESTRICTIVE TRANSFUSION PROTOCOL TRANSFUSION  IF HGB<7  or ACTIVE BLEEDING OR DX of ACUTE CORONARY SYNDROMES     Best Practice (right click and "Reselect all SmartList Selections" daily)   Diet/type: NPO w/ oral meds pending improved respiratory status DVT prophylaxis SCD, Lovenox Pressure ulcer(s): N/A GI prophylaxis: Pepcid Lines: N/A Foley:  N/A Code Status:  full code Last date of multidisciplinary goals of care discussion [Patient updated]  Labs   CBC: Recent Labs  Lab 03/10/24 2037 03/11/24 0516 03/12/24 0431 03/12/24 0604  03/13/24 0427 03/14/24 0548  WBC 10.0 8.3 11.6* 12.0* 16.5* 20.3*  NEUTROABS 8.3*  --   --   --   --   --   HGB 13.6 12.5* 12.9* 13.5 13.7 12.3*  HCT 40.6 38.2* 39.8 41.1 40.2 37.3*  MCV 95.8 98.5 98.3 97.6 96.6 95.6  PLT 286 237 258 265 286 307   Basic Metabolic Panel: Recent Labs  Lab 03/11/24 0516 03/12/24 0431 03/12/24 0604 03/13/24 0427 03/14/24 0548  NA 140 136 137 140 138  K 3.7 4.0 3.8 3.5 3.7  CL 103 103 102 103 99  CO2 27 27 28 28 31   GLUCOSE 99 133* 128* 193* 179*  BUN 28* 16 17 24* 37*  CREATININE 0.73 0.67 0.71 0.95 0.86  CALCIUM 7.9* 7.9* 8.1* 8.2* 8.3*  MG  --   --  1.9 2.3 2.6*  PHOS  --   --  2.6 4.0 2.6   GFR: Estimated Creatinine Clearance: 72.7 mL/min (by C-G formula based on SCr of 0.86 mg/dL). Recent Labs  Lab  03/10/24 2037 03/11/24 0516 03/12/24 0431 03/12/24 0604 03/13/24 0427 03/14/24 0548  PROCALCITON 0.10  --   --  <0.10  --   --   WBC 10.0   < > 11.6* 12.0* 16.5* 20.3*  LATICACIDVEN  --   --   --  1.2  --   --    < > = values in this interval not displayed.   ABG    Component Value Date/Time   PHART 7.35 03/13/2024 2204   PCO2ART 62 (H) 03/13/2024 2204   PO2ART 73 (L) 03/13/2024 2204   HCO3 34.2 (H) 03/13/2024 2204   TCO2 26 09/07/2007 1236   O2SAT 93.4 03/13/2024 2204    HbA1C: Hgb A1c MFr Bld  Date/Time Value Ref Range Status  07/26/2023 09:57 AM 6.1 (H) 4.8 - 5.6 % Final    Comment:             Prediabetes: 5.7 - 6.4          Diabetes: >6.4          Glycemic control for adults with diabetes: <7.0   12/30/2020 04:17 AM 5.5 4.8 - 5.6 % Final    Comment:    (NOTE) Pre diabetes:          5.7%-6.4%  Diabetes:              >6.4%  Glycemic control for   <7.0% adults with diabetes    ABG: Recent Labs  Lab 03/12/24 0501 03/13/24 2331 03/14/24 0358 03/14/24 0750  GLUCAP 124* 189* 196* 183*      DVT/GI PRX  assessed I Assessed the need for Labs I Assessed the need for Foley I Assessed the need for Central  Venous Line Family Discussion when available I Assessed the need for Mobilization I made an Assessment of medications to be adjusted accordingly Safety Risk assessment completed  CASE DISCUSSED IN MULTIDISCIPLINARY ROUNDS WITH ICU TEAM     Critical Care Time devoted to patient care services described in this note is 55 minutes.  Critical care was necessary to treat /prevent imminent and life-threatening deterioration. Overall, patient is critically ill, prognosis is guarded.  Patient with Multiorgan failure and at high risk for cardiac arrest and death.    Lucie Leather, M.D.  [facility]  Medical Director [facility] Medical Director [facility] Cardio-Pulmonary Department

## 2024-03-15 ENCOUNTER — Ambulatory Visit: Payer: PPO | Admitting: Internal Medicine

## 2024-03-15 DIAGNOSIS — J9602 Acute respiratory failure with hypercapnia: Secondary | ICD-10-CM | POA: Diagnosis not present

## 2024-03-15 DIAGNOSIS — G9341 Metabolic encephalopathy: Secondary | ICD-10-CM

## 2024-03-15 DIAGNOSIS — J9601 Acute respiratory failure with hypoxia: Secondary | ICD-10-CM | POA: Diagnosis not present

## 2024-03-15 DIAGNOSIS — J09X1 Influenza due to identified novel influenza A virus with pneumonia: Secondary | ICD-10-CM | POA: Diagnosis not present

## 2024-03-15 LAB — BASIC METABOLIC PANEL WITH GFR
Anion gap: 6 (ref 5–15)
Anion gap: 11 (ref 5–15)
BUN: 55 mg/dL — ABNORMAL HIGH (ref 8–23)
BUN: 65 mg/dL — ABNORMAL HIGH (ref 8–23)
CO2: 34 mmol/L — ABNORMAL HIGH (ref 22–32)
CO2: 35 mmol/L — ABNORMAL HIGH (ref 22–32)
Calcium: 8 mg/dL — ABNORMAL LOW (ref 8.9–10.3)
Calcium: 8.2 mg/dL — ABNORMAL LOW (ref 8.9–10.3)
Chloride: 99 mmol/L (ref 98–111)
Chloride: 94 mmol/L — ABNORMAL LOW (ref 98–111)
Creatinine, Ser: 0.83 mg/dL (ref 0.61–1.24)
Creatinine, Ser: 0.92 mg/dL (ref 0.61–1.24)
GFR, Estimated: >60 mL/min (ref >60)
GFR, Estimated: >60 mL/min (ref >60)
Glucose, Bld: 221 mg/dL — ABNORMAL HIGH (ref 70–99)
Glucose, Bld: 235 mg/dL — ABNORMAL HIGH (ref 70–99)
Potassium: 4.5 mmol/L (ref 3.5–5.1)
Potassium: 4.9 mmol/L (ref 3.5–5.1)
Sodium: 139 mmol/L (ref 135–145)
Sodium: 140 mmol/L (ref 135–145)

## 2024-03-15 LAB — GLUCOSE, CAPILLARY
Glucose-Capillary: 210 mg/dL — ABNORMAL HIGH (ref 70–99)
Glucose-Capillary: 180 mg/dL — ABNORMAL HIGH (ref 70–99)
Glucose-Capillary: 171 mg/dL — ABNORMAL HIGH (ref 70–99)
Glucose-Capillary: 205 mg/dL — ABNORMAL HIGH (ref 70–99)
Glucose-Capillary: 200 mg/dL — ABNORMAL HIGH (ref 70–99)
Glucose-Capillary: 162 mg/dL — ABNORMAL HIGH (ref 70–99)
Glucose-Capillary: 222 mg/dL — ABNORMAL HIGH (ref 70–99)

## 2024-03-15 LAB — MAGNESIUM: Magnesium: 3.1 mg/dL — ABNORMAL HIGH (ref 1.7–2.4)

## 2024-03-15 LAB — CBC
HCT: 38.6 % — ABNORMAL LOW (ref 39.0–52.0)
Hemoglobin: 12.7 g/dL — ABNORMAL LOW (ref 13.0–17.0)
MCH: 31.9 pg (ref 26.0–34.0)
MCHC: 32.9 g/dL (ref 30.0–36.0)
MCV: 97 fL (ref 80.0–100.0)
Platelets: 307 10*3/uL (ref 150–400)
RBC: 3.98 MIL/uL — ABNORMAL LOW (ref 4.22–5.81)
RDW: 13.2 % (ref 11.5–15.5)
WBC: 21.1 10*3/uL — ABNORMAL HIGH (ref 4.0–10.5)
nRBC: 0 % (ref 0.0–0.2)

## 2024-03-15 LAB — BLOOD GAS, ARTERIAL
Acid-Base Excess: 11.1 mmol/L — ABNORMAL HIGH (ref 0.0–2.0)
Bicarbonate: 39.3 mmol/L — ABNORMAL HIGH (ref 20.0–28.0)
FIO2: 100 %
MECHVT: 500 mL
Mechanical Rate: 20
O2 Saturation: 97 %
PEEP: 12 cmH2O
Patient temperature: 37
pCO2 arterial: 68 mmHg (ref 32–48)
pH, Arterial: 7.37 (ref 7.35–7.45)
pO2, Arterial: 95 mmHg (ref 83–108)

## 2024-03-15 LAB — PHOSPHORUS: Phosphorus: 2 mg/dL — ABNORMAL LOW (ref 2.5–4.6)

## 2024-03-15 MED ORDER — ORAL CARE MOUTH RINSE
15.0000 mL | OROMUCOSAL | Status: DC
Start: 2024-03-15 — End: 2024-03-16
  Administered 2024-03-15 – 2024-03-16 (×9): 15 mL via OROMUCOSAL

## 2024-03-15 MED ORDER — CHLORHEXIDINE GLUCONATE 0.12 % MT SOLN
15.0000 mL | Freq: Once | OROMUCOSAL | Status: AC
  Administered 2024-03-15: 15 mL via OROMUCOSAL
  Filled 2024-03-15: qty 15

## 2024-03-15 MED ORDER — FUROSEMIDE 10 MG/ML IJ SOLN
80.0000 mg | Freq: Once | INTRAMUSCULAR | Status: AC
  Administered 2024-03-15: 80 mg via INTRAVENOUS
  Filled 2024-03-15: qty 8

## 2024-03-15 MED ORDER — ORAL CARE MOUTH RINSE: 15.0000 mL | OROMUCOSAL | Status: DC

## 2024-03-15 MED ORDER — ORAL CARE MOUTH RINSE: 15.0000 mL | OROMUCOSAL | Status: DC | PRN

## 2024-03-15 NOTE — Plan of Care (Signed)
  Problem: Education: Goal: Knowledge of General Education information will improve Description: Including pain rating scale, medication(s)/side effects and non-pharmacologic comfort measures Outcome: Progressing   Problem: Health Behavior/Discharge Planning: Goal: Ability to manage health-related needs will improve Outcome: Progressing   Problem: Clinical Measurements: Goal: Ability to maintain clinical measurements within normal limits will improve Outcome: Progressing Goal: Will remain free from infection Outcome: Progressing Goal: Diagnostic test results will improve Outcome: Progressing Goal: Respiratory complications will improve Outcome: Progressing Goal: Cardiovascular complication will be avoided Outcome: Progressing   Problem: Activity: Goal: Risk for activity intolerance will decrease Outcome: Progressing   Problem: Nutrition: Goal: Adequate nutrition will be maintained Outcome: Progressing   Problem: Coping: Goal: Level of anxiety will decrease Outcome: Progressing   Problem: Elimination: Goal: Will not experience complications related to bowel motility Outcome: Progressing Goal: Will not experience complications related to urinary retention Outcome: Progressing   Problem: Pain Managment: Goal: General experience of comfort will improve and/or be controlled Outcome: Progressing   Problem: Safety: Goal: Ability to remain free from injury will improve Outcome: Progressing   Problem: Skin Integrity: Goal: Risk for impaired skin integrity will decrease Outcome: Progressing   Problem: Activity: Goal: Ability to tolerate increased activity will improve Outcome: Progressing   Problem: Clinical Measurements: Goal: Ability to maintain a body temperature in the normal range will improve Outcome: Progressing   Problem: Respiratory: Goal: Ability to maintain adequate ventilation will improve Outcome: Progressing Goal: Ability to maintain a clear airway  will improve Outcome: Progressing   Problem: Activity: Goal: Ability to tolerate increased activity will improve Outcome: Progressing   Problem: Respiratory: Goal: Ability to maintain a clear airway and adequate ventilation will improve Outcome: Progressing

## 2024-03-15 NOTE — TOC Progression Note (Signed)
 Transition of Care [facility]) - Progression Note    Patient Details  Name: Nathaniel Ibarra MRN: [id number] Date of Birth: 23-Aug-1941  Transition of Care [facility]) CM/SW Contact  Garret Reddish, RN Phone Number: 03/15/2024, 11:32 AM  Clinical Narrative:    Chart reviewed.  Noted that patient was admitted with acute severe hypoxic respiratory failure due to CAP and Flu A. Patient remains intubated and has been unable to wean at this time due to severe hypoxia.  ICU team managing care.  Plan for aggressive diuresis today. Noted consult for Palliative care to discuss goals of care with family.    TOC will continue to follow for discharge planning.         Expected Discharge Plan and Services                                               Social Determinants of Health (SDOH) Interventions SDOH Screenings   Food Insecurity: No Food Insecurity (03/11/2024)  Housing: Low Risk  (03/11/2024)  Transportation Needs: No Transportation Needs (03/11/2024)  Utilities: Not At Risk (03/11/2024)  Depression (PHQ2-9): Medium Risk (12/14/2023)  Financial Resource Strain: Low Risk  (09/07/2023)   Received from [facility]  Social Connections: Moderately Isolated (03/11/2024)  Tobacco Use: Low Risk  (03/10/2024)  Recent Concern: Tobacco Use - Medium Risk (03/03/2024)   Received from [facility]    Readmission Risk Interventions     No data to display

## 2024-03-15 NOTE — Inpatient Diabetes Management (Signed)
 Inpatient Diabetes Program Recommendations  AACE/ADA: New Consensus Statement on Inpatient Glycemic Control   Target Ranges:  Prepandial:   less than 140 mg/dL      Peak postprandial:   less than 180 mg/dL (1-2 hours)      Critically ill patients:  140 - 180 mg/dL    Latest Reference Range & Units 03/14/24 07:50 03/14/24 11:11 03/14/24 16:32 03/14/24 19:34 03/14/24 23:10 03/15/24 03:22 03/15/24 08:08  Glucose-Capillary 70 - 99 mg/dL 161 (H) 096 (H) 045 (H) 176 (H) 182 (H) 210 (H) 180 (H)   Review of Glycemic Control  Current orders for Inpatient glycemic control: Novolog 0-15 units Q4H; Solumedrol 40 mg Q12H; Vital @ 60 ml/hr  Inpatient Diabetes Program Recommendations:    Insulin: Please consider ordering Novolog 3 units Q4H for tube feeding coverage. If tube feeding is stopped or held then Novolog tube feeding coverage should also be stopped or held.  Thanks, Orlando Penner, RN, MSN, CDCES Diabetes Coordinator Inpatient Diabetes Program [phone] (Team Pager from 8am to 5pm)

## 2024-03-15 NOTE — Progress Notes (Addendum)
 MD has been notified of increase swelling on the neck, jaw area and upper chest area. Crepitus felt in upper chest when palpating. MD has notified RN to continue to monitor the patient as the swelling and crepitus is caused by subcutaneous emphysema which was also noticed on chest x-ray performed on 03/14/24 at 2235. MD is aware of ABG results.

## 2024-03-15 NOTE — Progress Notes (Signed)
 NAME:  DREYTON ROESSNER, MRN:  [id number], DOB:  29-Jun-1941, LOS: 5 ADMISSION DATE:  03/10/2024, CONSULTATION DATE:  03/12/24 REFERRING MD:  Valente David CHIEF COMPLAINT:  Respiratory Failure   History of Present Illness:  Terence Bart is an 83 yo with a PMH significant for HTN, HLD, HFrEF, CM, CAD, Oxygen dependent 4 Liters (which he rarely uses), GERD, BPH, Depression, Hypothyroidism that presented to the ER with worsening dyspnea over the past week. To review, patient tested positive for Influenza A with associated PNA 03/03/24 and completed a course of Tamiflu, prednisone taper, and antibiotics. Upon evaluation in the ER he was afebrile, 97% on 8 Liters, CXR low lung volumes with bronchitic changes and questionable developing right midlung zone and left mid to lower lung zone airspace opacities. He was started on antibiotics and HFNC, cultures sent. Overnight a RAPID was called for increased work of breathing.  Was placed on 100% bipap, lasix 40 mg x 1, Morphine, ABG, CXR, D-dimer sent.   PCCM was consulted for medical management of patients Respiratory failure  Pertinent  Medical History  HTN, HLD, HFrEF, CM, CAD, Oxygen dependent 4 Liters (which he rarely uses), GERD, BPH, Depression, Hypothyroidism  Significant Hospital Events: Including procedures, antibiotic start and stop dates in addition to other pertinent events   3/24: Admitted to the hospitalist service with CAP w/ respiratory failure. Abx and HFNC. Rapid response, Bipap 100% and transferred to the ICU 3/26 remains on vent, severe hypoxia 3/27 remains on vent severe hypoxia   Interim History / Subjective:  Remains critically ill Remains intubated Severe hypoxia Requires VENT support for survival   Vent Mode: PRVC FiO2 (%):  [80 %-100 %] 90 % Set Rate:  [15 bmp-20 bmp] 20 bmp Vt Set:  [500 mL] 500 mL PEEP:  [8 cmH20-12 cmH20] 12 cmH20 Pressure Support:  [20 cmH20] 20 cmH20   Objective   Blood pressure (!) 128/55,  pulse (!) 55, temperature 97.7 F (36.5 C), temperature source Axillary, resp. rate 18, height 6' (1.829 m), weight 73.3 kg, SpO2 99%.    Vent Mode: PRVC FiO2 (%):  [80 %-100 %] 90 % Set Rate:  [15 bmp-20 bmp] 20 bmp Vt Set:  [500 mL] 500 mL PEEP:  [8 cmH20-12 cmH20] 12 cmH20 Pressure Support:  [20 cmH20] 20 cmH20   Intake/Output Summary (Last 24 hours) at 03/15/2024 1047 Last data filed at 03/15/2024 1000 Gross per 24 hour  Intake 2308.37 ml  Output 2170 ml  Net 138.37 ml   Filed Weights   03/13/24 0350 03/14/24 0712 03/15/24 0500  Weight: 80.5 kg 82 kg 73.3 kg     REVIEW OF SYSTEMS  PATIENT IS UNABLE TO PROVIDE COMPLETE REVIEW OF SYSTEMS DUE TO SEVERE CRITICAL ILLNESS   PHYSICAL EXAMINATION:  GENERAL:critically ill appearing, +resp distress EYES: Pupils equal, round, reactive to light.  No scleral icterus.  MOUTH: Moist mucosal membrane. INTUBATED NECK: Supple.  PULMONARY: Lungs clear to auscultation, +rhonchi, +wheezing CARDIOVASCULAR: S1 and S2.  Regular rate and rhythm GASTROINTESTINAL: Soft, nontender, -distended. Positive bowel sounds.  MUSCULOSKELETAL: No swelling, clubbing, or edema.  NEUROLOGIC: obtunded,sedated SKIN:normal, warm to touch, Capillary refill delayed  Pulses present bilaterally    Assessment & Plan:   84 yo white male with end stage lung disease with acute severe hypoxic resp failure ,Due to Community Acquired PNA,Influenza A with ALI/ARDS  Severe ACUTE Hypoxic and Hypercapnic Respiratory Failure -continue Mechanical Ventilator support -Wean Fio2 and PEEP as tolerated -VAP/VENT bundle implementation - Wean PEEP &  FiO2 as tolerated, maintain SpO2 > 88% - Head of bed elevated 30 degrees, VAP protocol in place - Plateau pressures less than 30 cm H20  - Intermittent chest x-ray & ABG PRN - Ensure adequate pulmonary hygiene  Unable to wean due to severe hypoxia Severe HYPOXIA will need to consider CT chest NEG for PE Plan for aggressive  diuresis today  INFECTIOUS DISEASE -continue antibiotics as prescribed -follow up cultures -Solumedrol 40 mg Q 12 hours -Azithromycin + Ceftriaxone    Paroxysmal atrial fibrillation HFrEF, 3/23 EF 55% -Continue amiodarone and aldactone -Consider repeat Echo  RENAL -continue Foley Catheter-assess need -Avoid nephrotoxic agents -Follow urine output, BMP -Ensure adequate renal perfusion, optimize oxygenation -Renal dose medications   Intake/Output Summary (Last 24 hours) at 03/15/2024 1048 Last data filed at 03/15/2024 1000 Gross per 24 hour  Intake 2308.37 ml  Output 2170 ml  Net 138.37 ml        Latest Ref Rng & Units 03/15/2024    2:12 AM 03/14/2024    5:48 AM 03/13/2024    4:27 AM  BMP  Glucose 70 - 99 mg/dL 098  119  147   BUN 8 - 23 mg/dL 55  37  24   Creatinine 0.61 - 1.24 mg/dL 8.29  5.62  1.30   Sodium 135 - 145 mmol/L 139  138  140   Potassium 3.5 - 5.1 mmol/L 4.5  3.7  3.5   Chloride 98 - 111 mmol/L 99  99  103   CO2 22 - 32 mmol/L 34  31  28   Calcium 8.9 - 10.3 mg/dL 8.0  8.3  8.2      NEUROLOGY ACUTE METABOLIC ENCEPHALOPATHY -need for sedation -Goal RASS -2 to -3    ENDO - ICU hypoglycemic\Hyperglycemia protocol -check FSBS per protocol   GI GI PROPHYLAXIS as indicated NUTRITIONAL STATUS DIET-->TF's as tolerated Constipation protocol as indicated   ELECTROLYTES -follow labs as needed -replace as needed -pharmacy consultation and following  RESTRICTIVE TRANSFUSION PROTOCOL TRANSFUSION  IF HGB<7  or ACTIVE BLEEDING OR DX of ACUTE CORONARY SYNDROMES     Best Practice (right click and "Reselect all SmartList Selections" daily)   Diet/type: NPO w/ oral meds pending improved respiratory status DVT prophylaxis SCD, Lovenox Pressure ulcer(s): N/A GI prophylaxis: Pepcid Lines: N/A Foley:  N/A Code Status:  full code Last date of multidisciplinary goals of care discussion [Patient updated]  Labs   CBC: Recent Labs  Lab  03/10/24 2037 03/11/24 0516 03/12/24 0431 03/12/24 0604 03/13/24 0427 03/14/24 0548 03/15/24 0212  WBC 10.0   < > 11.6* 12.0* 16.5* 20.3* 21.1*  NEUTROABS 8.3*  --   --   --   --   --   --   HGB 13.6   < > 12.9* 13.5 13.7 12.3* 12.7*  HCT 40.6   < > 39.8 41.1 40.2 37.3* 38.6*  MCV 95.8   < > 98.3 97.6 96.6 95.6 97.0  PLT 286   < > 258 265 286 307 307   < > = values in this interval not displayed.   Basic Metabolic Panel: Recent Labs  Lab 03/12/24 0431 03/12/24 0604 03/13/24 0427 03/14/24 0548 03/15/24 0212  NA 136 137 140 138 139  K 4.0 3.8 3.5 3.7 4.5  CL 103 102 103 99 99  CO2 27 28 28 31  34*  GLUCOSE 133* 128* 193* 179* 221*  BUN 16 17 24* 37* 55*  CREATININE 0.67 0.71 0.95 0.86 0.83  CALCIUM 7.9*  8.1* 8.2* 8.3* 8.0*  MG  --  1.9 2.3 2.6* 3.1*  PHOS  --  2.6 4.0 2.6 2.0*   GFR: Estimated Creatinine Clearance: 71.1 mL/min (by C-G formula based on SCr of 0.83 mg/dL). Recent Labs  Lab 03/10/24 2037 03/11/24 0516 03/12/24 0604 03/13/24 0427 03/14/24 0548 03/15/24 0212  PROCALCITON 0.10  --  <0.10  --   --   --   WBC 10.0   < > 12.0* 16.5* 20.3* 21.1*  LATICACIDVEN  --   --  1.2  --   --   --    < > = values in this interval not displayed.   ABG    Component Value Date/Time   PHART 7.37 03/15/2024 0538   PCO2ART 68 (HH) 03/15/2024 0538   PO2ART 95 03/15/2024 0538   HCO3 39.3 (H) 03/15/2024 0538   TCO2 26 09/07/2007 1236   O2SAT 97 03/15/2024 0538    HbA1C: Hgb A1c MFr Bld  Date/Time Value Ref Range Status  07/26/2023 09:57 AM 6.1 (H) 4.8 - 5.6 % Final    Comment:             Prediabetes: 5.7 - 6.4          Diabetes: >6.4          Glycemic control for adults with diabetes: <7.0   12/30/2020 04:17 AM 5.5 4.8 - 5.6 % Final    Comment:    (NOTE) Pre diabetes:          5.7%-6.4%  Diabetes:              >6.4%  Glycemic control for   <7.0% adults with diabetes    ABG: Recent Labs  Lab 03/14/24 1632 03/14/24 1934 03/14/24 2310 03/15/24 0322  03/15/24 0808  GLUCAP 141* 176* 182* 210* 180*       DVT/GI PRX  assessed I Assessed the need for Labs I Assessed the need for Foley I Assessed the need for Central Venous Line Family Discussion when available I Assessed the need for Mobilization I made an Assessment of medications to be adjusted accordingly Safety Risk assessment completed  CASE DISCUSSED IN MULTIDISCIPLINARY ROUNDS WITH ICU TEAM     Critical Care Time devoted to patient care services described in this note is 64 minutes.  Critical care was necessary to treat /prevent imminent and life-threatening deterioration. Overall, patient is critically ill, prognosis is guarded.  Patient with Multiorgan failure and at high risk for cardiac arrest and death.    Lucie Leather, M.D.  [facility]  Medical Director [facility] Medical Director [facility] Cardio-Pulmonary Department

## 2024-03-15 NOTE — Plan of Care (Signed)
 Palliative consult received.  Mr. Elko remains intubated and sedated. Family called and request meeting 3/29 at 12 noon, ICU team aware.  No Charge.  Leeanne Deed, DNP, AGNP-C Palliative Medicine  Please call Palliative Medicine team phone with any questions [phone]. For individual providers please see AMION.

## 2024-03-16 DIAGNOSIS — G9341 Metabolic encephalopathy: Secondary | ICD-10-CM | POA: Diagnosis not present

## 2024-03-16 DIAGNOSIS — J9601 Acute respiratory failure with hypoxia: Secondary | ICD-10-CM | POA: Diagnosis not present

## 2024-03-16 DIAGNOSIS — J101 Influenza due to other identified influenza virus with other respiratory manifestations: Secondary | ICD-10-CM | POA: Diagnosis not present

## 2024-03-16 DIAGNOSIS — J9602 Acute respiratory failure with hypercapnia: Secondary | ICD-10-CM | POA: Diagnosis not present

## 2024-03-16 DIAGNOSIS — J189 Pneumonia, unspecified organism: Secondary | ICD-10-CM | POA: Diagnosis not present

## 2024-03-16 DIAGNOSIS — Z515 Encounter for palliative care: Secondary | ICD-10-CM

## 2024-03-16 DIAGNOSIS — J09X1 Influenza due to identified novel influenza A virus with pneumonia: Secondary | ICD-10-CM | POA: Diagnosis not present

## 2024-03-16 LAB — GLUCOSE, CAPILLARY
Glucose-Capillary: 209 mg/dL — ABNORMAL HIGH (ref 70–99)
Glucose-Capillary: 164 mg/dL — ABNORMAL HIGH (ref 70–99)
Glucose-Capillary: 208 mg/dL — ABNORMAL HIGH (ref 70–99)
Glucose-Capillary: 195 mg/dL — ABNORMAL HIGH (ref 70–99)
Glucose-Capillary: 200 mg/dL — ABNORMAL HIGH (ref 70–99)

## 2024-03-16 LAB — CULTURE, RESPIRATORY W GRAM STAIN: Culture: NO GROWTH

## 2024-03-16 LAB — BASIC METABOLIC PANEL WITH GFR
Anion gap: 7 (ref 5–15)
Anion gap: 6 (ref 5–15)
BUN: 93 mg/dL — ABNORMAL HIGH (ref 8–23)
BUN: 129 mg/dL — ABNORMAL HIGH (ref 8–23)
CO2: 35 mmol/L — ABNORMAL HIGH (ref 22–32)
CO2: 37 mmol/L — ABNORMAL HIGH (ref 22–32)
Calcium: 8.3 mg/dL — ABNORMAL LOW (ref 8.9–10.3)
Calcium: 8.1 mg/dL — ABNORMAL LOW (ref 8.9–10.3)
Chloride: 96 mmol/L — ABNORMAL LOW (ref 98–111)
Chloride: 96 mmol/L — ABNORMAL LOW (ref 98–111)
Creatinine, Ser: 0.97 mg/dL (ref 0.61–1.24)
Creatinine, Ser: 1.04 mg/dL (ref 0.61–1.24)
GFR, Estimated: >60 mL/min (ref >60)
GFR, Estimated: >60 mL/min (ref >60)
Glucose, Bld: 203 mg/dL — ABNORMAL HIGH (ref 70–99)
Glucose, Bld: 249 mg/dL — ABNORMAL HIGH (ref 70–99)
Potassium: 5.2 mmol/L — ABNORMAL HIGH (ref 3.5–5.1)
Potassium: 5.4 mmol/L — ABNORMAL HIGH (ref 3.5–5.1)
Sodium: 138 mmol/L (ref 135–145)
Sodium: 139 mmol/L (ref 135–145)

## 2024-03-16 LAB — CBC
HCT: 42.2 % (ref 39.0–52.0)
Hemoglobin: 13.6 g/dL (ref 13.0–17.0)
MCH: 31.9 pg (ref 26.0–34.0)
MCHC: 32.2 g/dL (ref 30.0–36.0)
MCV: 98.8 fL (ref 80.0–100.0)
Platelets: 453 10*3/uL — ABNORMAL HIGH (ref 150–400)
RBC: 4.27 MIL/uL (ref 4.22–5.81)
RDW: 13.7 % (ref 11.5–15.5)
WBC: 26.9 10*3/uL — ABNORMAL HIGH (ref 4.0–10.5)
nRBC: 0 % (ref 0.0–0.2)

## 2024-03-16 LAB — TRIGLYCERIDES: Triglycerides: 213 mg/dL — ABNORMAL HIGH (ref <150)

## 2024-03-16 LAB — MAGNESIUM: Magnesium: 3.4 mg/dL — ABNORMAL HIGH (ref 1.7–2.4)

## 2024-03-16 LAB — PHOSPHORUS: Phosphorus: 2.3 mg/dL — ABNORMAL LOW (ref 2.5–4.6)

## 2024-03-16 MED ORDER — ORAL CARE MOUTH RINSE
15.0000 mL | OROMUCOSAL | Status: DC
  Administered 2024-03-16 – 2024-03-24 (×92): 15 mL via OROMUCOSAL

## 2024-03-16 MED ORDER — INSULIN ASPART 100 UNIT/ML IJ SOLN
0.0000 [IU] | INTRAMUSCULAR | Status: DC
  Administered 2024-03-16 (×3): 4 [IU] via SUBCUTANEOUS
  Administered 2024-03-16: 7 [IU] via SUBCUTANEOUS
  Administered 2024-03-17: 4 [IU] via SUBCUTANEOUS
  Administered 2024-03-17 (×3): 7 [IU] via SUBCUTANEOUS
  Administered 2024-03-17 (×3): 4 [IU] via SUBCUTANEOUS
  Administered 2024-03-18 (×2): 7 [IU] via SUBCUTANEOUS
  Administered 2024-03-18: 4 [IU] via SUBCUTANEOUS
  Administered 2024-03-18 – 2024-03-19 (×3): 7 [IU] via SUBCUTANEOUS
  Administered 2024-03-19: 4 [IU] via SUBCUTANEOUS
  Administered 2024-03-19 (×2): 7 [IU] via SUBCUTANEOUS
  Administered 2024-03-19 – 2024-03-20 (×3): 4 [IU] via SUBCUTANEOUS
  Administered 2024-03-20: 7 [IU] via SUBCUTANEOUS
  Administered 2024-03-20 (×2): 4 [IU] via SUBCUTANEOUS
  Administered 2024-03-20: 7 [IU] via SUBCUTANEOUS
  Administered 2024-03-20 (×2): 11 [IU] via SUBCUTANEOUS
  Administered 2024-03-21 (×2): 7 [IU] via SUBCUTANEOUS
  Administered 2024-03-21 (×2): 4 [IU] via SUBCUTANEOUS
  Administered 2024-03-21 – 2024-03-22 (×3): 7 [IU] via SUBCUTANEOUS
  Administered 2024-03-22: 4 [IU] via SUBCUTANEOUS
  Administered 2024-03-22 (×2): 7 [IU] via SUBCUTANEOUS
  Administered 2024-03-22: 4 [IU] via SUBCUTANEOUS
  Administered 2024-03-22 – 2024-03-23 (×2): 7 [IU] via SUBCUTANEOUS
  Administered 2024-03-23: 11 [IU] via SUBCUTANEOUS
  Administered 2024-03-23 (×2): 7 [IU] via SUBCUTANEOUS
  Administered 2024-03-23: 4 [IU] via SUBCUTANEOUS
  Administered 2024-03-23: 11 [IU] via SUBCUTANEOUS
  Administered 2024-03-24: 7 [IU] via SUBCUTANEOUS
  Filled 2024-03-16 (×48): qty 1

## 2024-03-16 MED ORDER — SODIUM ZIRCONIUM CYCLOSILICATE 5 G PO PACK
10.0000 g | PACK | Freq: Three times a day (TID) | ORAL | Status: AC
  Administered 2024-03-17 (×2): 10 g
  Filled 2024-03-16 (×2): qty 2

## 2024-03-16 MED ORDER — ORAL CARE MOUTH RINSE: 15.0000 mL | OROMUCOSAL | Status: DC | PRN

## 2024-03-16 NOTE — Plan of Care (Signed)
  Problem: Clinical Measurements: Goal: Respiratory complications will improve Outcome: Progressing   Problem: Nutrition: Goal: Adequate nutrition will be maintained Outcome: Progressing   Problem: Elimination: Goal: Will not experience complications related to bowel motility Outcome: Progressing

## 2024-03-16 NOTE — Consult Note (Signed)
 Consultation Note Date: 03/16/2024   Patient Name: Nathaniel Ibarra  DOB: 14-Oct-1941  MRN: [id number]  Age / Sex: 83 y.o., male  PCP: Nathaniel Monday, MD Referring Physician: Erin Fulling, MD  Reason for Consultation: Establishing goals of care   HPI/Brief Hospital Course: 83 y.o. male  with past medical history of HFrEF, cardiomyopathy, BPH, HTN, HLD, CAD and hypothyroidism admitted from home on 03/10/2024 with worsening dyspnea, cough and wheezing over the last week. Diagnosed with influenza and PNA about 1 week prior, completed course of Tamiflu and antibiotics.  Found to be hypoxic, admitted and being treated for acute hypoxic respiratory failure and CAP RRT called 3/25 due to worsening hypoxia-required intubation and transferred to ICU  3/29 remains intubated and sedated in ICU, requiring significant support from mechanical ventilation due to ALI/ARDS  Palliative medicine was consulted for assisting with goals of care conversations.  Subjective:  Extensive chart review has been completed prior to meeting patient including labs, vital signs, imaging, progress notes, orders, and available advanced directive documents from current and previous encounters.  Collaborative meeting held in conference room with wife, son and granddaughter, PMT and CCM team.  Introduced myself as a Publishing rights manager as a member of the palliative care team. Explained palliative medicine is specialized medical care for people living with serious illness. It focuses on providing relief from the symptoms and stress of a serious illness. The goal is to improve quality of life for both the patient and the family.   Dr. Belia Ibarra provided medical updates and review of clinical course.  Family shares Mr. 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  We discussed patient's current illness and what  it means in the larger context of patient's on-going co-morbidities. Natural disease trajectory and expectations at EOL were discussed.   Detailed discussions were had surrounding possible outcomes and anticipate trajectories. Discussions had around quantity versus quality of life.  We discussed code status and the difference between Full Code and Do Not Resuscitate. Encouraged family to consider DNR/DNI status understanding evidenced based poor outcomes in similar hospitalized patients, as the cause of the arrest is likely associated with chronic/terminal disease rather than a reversible acute cardio-pulmonary event.    The difference between aggressive medical intervention and comfort care was discussed.  Family has agreed to DNR status at this time. They wish to continue with current treatment plan with daily updates and conversations. They share Mr. Nathaniel Ibarra would not be accepting of trach/PEG. They are hopeful Mr. Nathaniel Ibarra will show signs of improvement but also accept the severity of his disease and would not want him to suffer.  I discussed importance of continued conversations with family/support persons and all members of their medical team regarding overall plan of care and treatment options ensuring decisions are in alignment with patients goals of care.  All questions/concerns addressed. Emotional support provided to patient/family/support persons. PMT will continue to follow and support patient as needed.  Objective: Primary Diagnoses: Present on Admission:  Acute respiratory failure with hypoxia (HCC)  Paroxysmal atrial fibrillation (HCC)  BPH (benign prostatic hyperplasia)  Hypothyroidism  Depression  Multifocal pneumonia   Vital Signs: BP (!) 154/61   Pulse 78   Temp 98.7 F (37.1 C) (Oral)   Resp (!) 25   Ht 6' (1.829 m)   Wt 73.4 kg   SpO2 100%   BMI 21.95 kg/m  Pain Scale: CPOT  Pain Score: 0-No pain    IO: Intake/output summary:  Intake/Output Summary  (Last 24 hours) at 03/16/2024 1404 Last data filed at 03/16/2024 1200 Gross per 24 hour  Intake 2385.18 ml  Output 1725 ml  Net 660.18 ml    LBM: Last BM Date : 03/14/24 Baseline Weight: Weight: 86.2 kg Most recent weight: Weight: 73.4 kg      Assessment and Plan  SUMMARY OF RECOMMENDATIONS   DNR Time for outcomes Ongoing GOC conversations with family and CCM  Palliative Prophylaxis:   Bowel Regimen, Delirium Protocol and Frequent Pain Assessment  Discussed With: CCM team and nursing staff   Thank you for this consult and allowing Palliative Medicine to participate in the care of Nathaniel Ibarra. Palliative medicine will continue to follow and assist as needed.   Time Total: 75 minutes  Time spent includes: Detailed review of medical records (labs, imaging, vital signs), medically appropriate exam (mental status, respiratory, cardiac, skin), discussed with treatment team, counseling and educating patient, family and staff, documenting clinical information, medication management and coordination of care.   Signed by: Nathaniel Deed, DNP, AGNP-C Palliative Medicine    Please contact Palliative Medicine Team phone at [phone] for questions and concerns.  For individual provider: See Loretha Stapler

## 2024-03-16 NOTE — IPAL (Signed)
  Interdisciplinary Goals of Care Family Meeting   Date carried out: 03/16/2024  Location of the meeting: Conference room  Member's involved: Physician, Nurse Practitioner, Bedside Registered Nurse, Family Member or next of kin, and Palliative care team member   GOALS OF CARE DISCUSSION  The Clinical status was relayed to family in detail-  Updated and notified of patients medical condition- Patient remains unresponsive and will not open eyes to command.   Patient with increased WOB and using accessory muscles to breathe Explained to family course of therapy and the modalities  Patient with Progressive multiorgan failure with a very high probablity of a very minimal chance of meaningful recovery despite all aggressive and optimal medical therapy.   Family understands the situation.  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 All questions answered  Additional CC time 35 mins   Nathaniel Ibarra Nathaniel Ibarra, M.D.  [facility]  Medical Director [facility] Medical Director [facility] Cardio-Pulmonary Department

## 2024-03-16 NOTE — Plan of Care (Signed)
  Problem: Clinical Measurements: Goal: Ability to maintain clinical measurements within normal limits will improve Outcome: Progressing Goal: Respiratory complications will improve Outcome: Progressing Goal: Cardiovascular complication will be avoided Outcome: Progressing   Problem: Activity: Goal: Risk for activity intolerance will decrease Outcome: Progressing   Problem: Nutrition: Goal: Adequate nutrition will be maintained Outcome: Progressing   Problem: Pain Managment: Goal: General experience of comfort will improve and/or be controlled Outcome: Progressing   Problem: Safety: Goal: Ability to remain free from injury will improve Outcome: Progressing   Problem: Skin Integrity: Goal: Risk for impaired skin integrity will decrease Outcome: Progressing

## 2024-03-16 NOTE — Progress Notes (Signed)
 NAME:  Nathaniel Ibarra, MRN:  [id number], DOB:  1941/09/09, LOS: 6 ADMISSION DATE:  03/10/2024, CONSULTATION DATE:  03/12/24 REFERRING MD:  Valente David   CHIEF COMPLAINT:  Respiratory Failure   History of Present Illness:  Nathaniel Ibarra is an 83 yo with a PMH significant for HTN, HLD, HFrEF, CM, CAD, Oxygen dependent 4 Liters (which he rarely uses), GERD, BPH, Depression, Hypothyroidism that presented to the ER with worsening dyspnea over the past week. To review, patient tested positive for Influenza A with associated PNA 03/03/24 and completed a course of Tamiflu, prednisone taper, and antibiotics. Upon evaluation in the ER he was afebrile, 97% on 8 Liters, CXR low lung volumes with bronchitic changes and questionable developing right midlung zone and left mid to lower lung zone airspace opacities. He was started on antibiotics and HFNC, cultures sent. Overnight a RAPID was called for increased work of breathing.  Was placed on 100% bipap, lasix 40 mg x 1, Morphine, ABG, CXR, D-dimer sent.   PCCM was consulted for medical management of patients Respiratory failure  Pertinent  Medical History  HTN, HLD, HFrEF, CM, CAD, Oxygen dependent 4 Liters (which he rarely uses), GERD, BPH, Depression, Hypothyroidism  Significant Hospital Events: Including procedures, antibiotic start and stop dates in addition to other pertinent events   3/24: Admitted to the hospitalist service with CAP w/ respiratory failure. Abx and HFNC. Rapid response, Bipap 100% and transferred to the ICU 3/26 remains on vent, severe hypoxia 3/27 remains on vent severe hypoxia 3/28 remains on vent, severe hypoxia 3/29 severe hypoxia  Interim History / Subjective:  Remains critically ill Remains intubated Severe hypoxia Requires VENT support for survival Severe Subcutaneous emphysema  Vent Mode: PRVC FiO2 (%):  [75 %-80 %] 75 % Set Rate:  [20 bmp] 20 bmp Vt Set:  [500 mL] 500 mL PEEP:  [10 cmH20-12 cmH20] 10  cmH20 Plateau Pressure:  [18 cmH20-24 cmH20] 24 cmH20   Objective   Blood pressure (!) 144/59, pulse 73, temperature 99.1 F (37.3 C), temperature source Oral, resp. rate (!) 25, height 6' (1.829 m), weight 73.4 kg, SpO2 100%.    Vent Mode: PRVC FiO2 (%):  [75 %-80 %] 75 % Set Rate:  [20 bmp] 20 bmp Vt Set:  [500 mL] 500 mL PEEP:  [10 cmH20-12 cmH20] 10 cmH20 Plateau Pressure:  [18 cmH20-24 cmH20] 24 cmH20   Intake/Output Summary (Last 24 hours) at 03/16/2024 0743 Last data filed at 03/16/2024 0700 Gross per 24 hour  Intake 2481.69 ml  Output 1760 ml  Net 721.69 ml   Filed Weights   03/14/24 0712 03/15/24 0500 03/16/24 0152  Weight: 82 kg 73.3 kg 73.4 kg    REVIEW OF SYSTEMS  PATIENT IS UNABLE TO PROVIDE COMPLETE REVIEW OF SYSTEMS DUE TO SEVERE CRITICAL ILLNESS   PHYSICAL EXAMINATION:  GENERAL:critically ill appearing, +resp distress EYES: Pupils equal, round, reactive to light.  No scleral icterus.  MOUTH: Moist mucosal membrane. INTUBATED NECK: Supple.  PULMONARY: Lungs clear to auscultation, +rhonchi, +wheezing CARDIOVASCULAR: S1 and S2.  Regular rate and rhythm GASTROINTESTINAL: Soft, nontender, -distended. Positive bowel sounds.  MUSCULOSKELETAL: No swelling, clubbing, or edema.  NEUROLOGIC: obtunded,sedated SKIN:normal, warm to touch, Capillary refill delayed  Pulses present bilaterally Upper chest and neck subcutaneous emphysema   Assessment & Plan:   83 yo white male with end stage lung disease with acute severe hypoxic resp failure ,Due to Community Acquired PNA,Influenza A with ALI/ARDS Failure to wean from vent  Severe ACUTE Hypoxic  and Hypercapnic Respiratory Failure -continue Mechanical Ventilator support -Wean Fio2 and PEEP as tolerated -VAP/VENT bundle implementation - Wean PEEP & FiO2 as tolerated, maintain SpO2 > 88% - Head of bed elevated 30 degrees, VAP protocol in place - Plateau pressures less than 30 cm H20  - Intermittent chest x-ray &  ABG PRN - Ensure adequate pulmonary hygiene  Unable to wean due to severe hypoxia Severe HYPOXIA will need to consider CT chest NEG for PE Plan for aggressive diuresis today  INFECTIOUS DISEASE -continue antibiotics as prescribed -follow up cultures -Solumedrol 40 mg Q 12 hours -Azithromycin + Ceftriaxone    Paroxysmal atrial fibrillation HFrEF, 3/23 EF 55% -Continue amiodarone and aldactone  RENAL -continue Foley Catheter-assess need -Avoid nephrotoxic agents -Follow urine output, BMP -Ensure adequate renal perfusion, optimize oxygenation -Renal dose medications   Intake/Output Summary (Last 24 hours) at 03/16/2024 0746 Last data filed at 03/16/2024 0700 Gross per 24 hour  Intake 2481.69 ml  Output 1760 ml  Net 721.69 ml       Latest Ref Rng & Units 03/16/2024    6:22 AM 03/15/2024    2:11 PM 03/15/2024    2:12 AM  BMP  Glucose 70 - 99 mg/dL 161  096  045   BUN 8 - 23 mg/dL 93  65  55   Creatinine 0.61 - 1.24 mg/dL 4.09  8.11  9.14   Sodium 135 - 145 mmol/L 138  140  139   Potassium 3.5 - 5.1 mmol/L 5.2  4.9  4.5   Chloride 98 - 111 mmol/L 96  94  99   CO2 22 - 32 mmol/L 35  35  34   Calcium 8.9 - 10.3 mg/dL 8.3  8.2  8.0      NEUROLOGY ACUTE METABOLIC ENCEPHALOPATHY -need for sedation -Goal RASS -2 to -3   ENDO - ICU hypoglycemic\Hyperglycemia protocol -check FSBS per protocol   GI GI PROPHYLAXIS as indicated NUTRITIONAL STATUS DIET-->TF's as tolerated Constipation protocol as indicated   ELECTROLYTES -follow labs as needed -replace as needed -pharmacy consultation and following  RESTRICTIVE TRANSFUSION PROTOCOL TRANSFUSION  IF HGB<7  or ACTIVE BLEEDING OR DX of ACUTE CORONARY SYNDROMES        Best Practice (right click and "Reselect all SmartList Selections" daily)   Diet/type: NPO w/ oral meds pending improved respiratory status DVT prophylaxis SCD, Lovenox Pressure ulcer(s): N/A GI prophylaxis: Pepcid Lines: N/A Foley:   N/A Code Status:  full code Last date of multidisciplinary goals of care discussion [Patient updated]  Labs   CBC: Recent Labs  Lab 03/10/24 2037 03/11/24 0516 03/12/24 0604 03/13/24 0427 03/14/24 0548 03/15/24 0212 03/16/24 0622  WBC 10.0   < > 12.0* 16.5* 20.3* 21.1* 26.9*  NEUTROABS 8.3*  --   --   --   --   --   --   HGB 13.6   < > 13.5 13.7 12.3* 12.7* 13.6  HCT 40.6   < > 41.1 40.2 37.3* 38.6* 42.2  MCV 95.8   < > 97.6 96.6 95.6 97.0 98.8  PLT 286   < > 265 286 307 307 453*   < > = values in this interval not displayed.   Basic Metabolic Panel: Recent Labs  Lab 03/12/24 0604 03/13/24 0427 03/14/24 0548 03/15/24 0212 03/15/24 1411 03/16/24 0622  NA 137 140 138 139 140 138  K 3.8 3.5 3.7 4.5 4.9 5.2*  CL 102 103 99 99 94* 96*  CO2 28 28 31  34* 35*  35*  GLUCOSE 128* 193* 179* 221* 235* 203*  BUN 17 24* 37* 55* 65* 93*  CREATININE 0.71 0.95 0.86 0.83 0.92 0.97  CALCIUM 8.1* 8.2* 8.3* 8.0* 8.2* 8.3*  MG 1.9 2.3 2.6* 3.1*  --  3.4*  PHOS 2.6 4.0 2.6 2.0*  --  2.3*   GFR: Estimated Creatinine Clearance: 61 mL/min (by C-G formula based on SCr of 0.97 mg/dL). Recent Labs  Lab 03/10/24 2037 03/11/24 0516 03/12/24 0604 03/13/24 0427 03/14/24 0548 03/15/24 0212 03/16/24 0622  PROCALCITON 0.10  --  <0.10  --   --   --   --   WBC 10.0   < > 12.0* 16.5* 20.3* 21.1* 26.9*  LATICACIDVEN  --   --  1.2  --   --   --   --    < > = values in this interval not displayed.   ABG    Component Value Date/Time   PHART 7.3 (L) 03/15/2024 1334   PCO2ART 63 (H) 03/15/2024 1334   PO2ART 74 (L) 03/15/2024 1334   HCO3 31.0 (H) 03/15/2024 1334   TCO2 26 09/07/2007 1236   O2SAT PENDING 03/15/2024 1334    HbA1C: Hgb A1c MFr Bld  Date/Time Value Ref Range Status  07/26/2023 09:57 AM 6.1 (H) 4.8 - 5.6 % Final    Comment:             Prediabetes: 5.7 - 6.4          Diabetes: >6.4          Glycemic control for adults with diabetes: <7.0   12/30/2020 04:17 AM 5.5 4.8 - 5.6  % Final    Comment:    (NOTE) Pre diabetes:          5.7%-6.4%  Diabetes:              >6.4%  Glycemic control for   <7.0% adults with diabetes    ABG: Recent Labs  Lab 03/15/24 1649 03/15/24 1931 03/15/24 2318 03/16/24 0322 03/16/24 0723  GLUCAP 200* 162* 222* 209* 164*       DVT/GI PRX  assessed I Assessed the need for Labs I Assessed the need for Foley I Assessed the need for Central Venous Line Family Discussion when available I Assessed the need for Mobilization I made an Assessment of medications to be adjusted accordingly Safety Risk assessment completed  CASE DISCUSSED IN MULTIDISCIPLINARY ROUNDS WITH ICU TEAM     Critical Care Time devoted to patient care services described in this note is 55 minutes.  Critical care was necessary to treat /prevent imminent and life-threatening deterioration. Overall, patient is critically ill, prognosis is guarded.  Patient with Multiorgan failure and at high risk for cardiac arrest and death.    Lucie Leather, M.D.  [facility]  Medical Director [facility] Medical Director [facility] Cardio-Pulmonary Department

## 2024-03-17 ENCOUNTER — Inpatient Hospital Stay

## 2024-03-17 DIAGNOSIS — Z515 Encounter for palliative care: Secondary | ICD-10-CM | POA: Diagnosis not present

## 2024-03-17 DIAGNOSIS — J09X1 Influenza due to identified novel influenza A virus with pneumonia: Secondary | ICD-10-CM | POA: Diagnosis not present

## 2024-03-17 DIAGNOSIS — G9341 Metabolic encephalopathy: Secondary | ICD-10-CM | POA: Diagnosis not present

## 2024-03-17 DIAGNOSIS — J9602 Acute respiratory failure with hypercapnia: Secondary | ICD-10-CM | POA: Diagnosis not present

## 2024-03-17 DIAGNOSIS — J101 Influenza due to other identified influenza virus with other respiratory manifestations: Secondary | ICD-10-CM | POA: Diagnosis not present

## 2024-03-17 DIAGNOSIS — J9601 Acute respiratory failure with hypoxia: Secondary | ICD-10-CM | POA: Diagnosis not present

## 2024-03-17 DIAGNOSIS — E875 Hyperkalemia: Secondary | ICD-10-CM | POA: Diagnosis not present

## 2024-03-17 LAB — BASIC METABOLIC PANEL WITH GFR
Anion gap: 6 (ref 5–15)
BUN: 104 mg/dL — ABNORMAL HIGH (ref 8–23)
CO2: 35 mmol/L — ABNORMAL HIGH (ref 22–32)
Calcium: 7.9 mg/dL — ABNORMAL LOW (ref 8.9–10.3)
Chloride: 97 mmol/L — ABNORMAL LOW (ref 98–111)
Creatinine, Ser: 1 mg/dL (ref 0.61–1.24)
GFR, Estimated: >60 mL/min (ref >60)
Glucose, Bld: 241 mg/dL — ABNORMAL HIGH (ref 70–99)
Potassium: 5.2 mmol/L — ABNORMAL HIGH (ref 3.5–5.1)
Sodium: 138 mmol/L (ref 135–145)

## 2024-03-17 LAB — PHOSPHORUS: Phosphorus: 2.5 mg/dL (ref 2.5–4.6)

## 2024-03-17 LAB — CBC
HCT: 42.4 % (ref 39.0–52.0)
Hemoglobin: 13.2 g/dL (ref 13.0–17.0)
MCH: 31.3 pg (ref 26.0–34.0)
MCHC: 31.1 g/dL (ref 30.0–36.0)
MCV: 100.5 fL — ABNORMAL HIGH (ref 80.0–100.0)
Platelets: 415 10*3/uL — ABNORMAL HIGH (ref 150–400)
RBC: 4.22 MIL/uL (ref 4.22–5.81)
RDW: 13.9 % (ref 11.5–15.5)
WBC: 26.1 10*3/uL — ABNORMAL HIGH (ref 4.0–10.5)
nRBC: 0 % (ref 0.0–0.2)

## 2024-03-17 LAB — GLUCOSE, CAPILLARY
Glucose-Capillary: 156 mg/dL — ABNORMAL HIGH (ref 70–99)
Glucose-Capillary: 175 mg/dL — ABNORMAL HIGH (ref 70–99)
Glucose-Capillary: 178 mg/dL — ABNORMAL HIGH (ref 70–99)
Glucose-Capillary: 193 mg/dL — ABNORMAL HIGH (ref 70–99)
Glucose-Capillary: 218 mg/dL — ABNORMAL HIGH (ref 70–99)
Glucose-Capillary: 224 mg/dL — ABNORMAL HIGH (ref 70–99)
Glucose-Capillary: 247 mg/dL — ABNORMAL HIGH (ref 70–99)

## 2024-03-17 LAB — MAGNESIUM: Magnesium: 3.3 mg/dL — ABNORMAL HIGH (ref 1.7–2.4)

## 2024-03-17 MED ORDER — INSULIN ASPART 100 UNIT/ML IJ SOLN
2.0000 [IU] | INTRAMUSCULAR | Status: DC
  Administered 2024-03-17 – 2024-03-20 (×21): 2 [IU] via SUBCUTANEOUS
  Filled 2024-03-17 (×21): qty 1

## 2024-03-17 MED ORDER — ORAL CARE MOUTH RINSE
15.0000 mL | OROMUCOSAL | Status: DC
  Administered 2024-03-17 – 2024-03-18 (×10): 15 mL via OROMUCOSAL

## 2024-03-17 MED ORDER — ORAL CARE MOUTH RINSE: 15.0000 mL | OROMUCOSAL | Status: DC | PRN

## 2024-03-17 NOTE — Plan of Care (Signed)
  Problem: Nutrition: Goal: Adequate nutrition will be maintained Outcome: Progressing   Problem: Elimination: Goal: Will not experience complications related to urinary retention Outcome: Progressing   Problem: Skin Integrity: Goal: Risk for impaired skin integrity will decrease Outcome: Progressing   Problem: Education: Goal: Knowledge of General Education information will improve Description: Including pain rating scale, medication(s)/side effects and non-pharmacologic comfort measures Outcome: Not Progressing   Problem: Health Behavior/Discharge Planning: Goal: Ability to manage health-related needs will improve Outcome: Not Progressing   Problem: Clinical Measurements: Goal: Ability to maintain clinical measurements within normal limits will improve Outcome: Not Progressing Goal: Will remain free from infection Outcome: Not Progressing Goal: Diagnostic test results will improve Outcome: Not Progressing Goal: Respiratory complications will improve Outcome: Not Progressing Goal: Cardiovascular complication will be avoided Outcome: Not Progressing   Problem: Activity: Goal: Risk for activity intolerance will decrease Outcome: Not Progressing   Problem: Elimination: Goal: Will not experience complications related to bowel motility Outcome: Not Progressing   Problem: Activity: Goal: Ability to tolerate increased activity will improve Outcome: Not Progressing   Problem: Respiratory: Goal: Ability to maintain adequate ventilation will improve Outcome: Not Progressing Goal: Ability to maintain a clear airway will improve Outcome: Not Progressing   Problem: Activity: Goal: Ability to tolerate increased activity will improve Outcome: Not Progressing   Problem: Respiratory: Goal: Ability to maintain a clear airway and adequate ventilation will improve Outcome: Not Progressing

## 2024-03-17 NOTE — Progress Notes (Signed)
 NAME:  Nathaniel Ibarra, MRN:  [id number], DOB:  February 12, 1941, LOS: 7 ADMISSION DATE:  03/10/2024, CONSULTATION DATE:  03/12/24 REFERRING MD:  Valente David   CHIEF COMPLAINT:  Respiratory Failure   History of Present Illness:  Nathaniel Ibarra is an 83 yo with a PMH significant for HTN, HLD, HFrEF, CM, CAD, Oxygen dependent 4 Liters (which he rarely uses), GERD, BPH, Depression, Hypothyroidism that presented to the ER with worsening dyspnea over the past week. To review, patient tested positive for Influenza A with associated PNA 03/03/24 and completed a course of Tamiflu, prednisone taper, and antibiotics. Upon evaluation in the ER he was afebrile, 97% on 8 Liters, CXR low lung volumes with bronchitic changes and questionable developing right midlung zone and left mid to lower lung zone airspace opacities. He was started on antibiotics and HFNC, cultures sent. Overnight a RAPID was called for increased work of breathing.  Was placed on 100% bipap, lasix 40 mg x 1, Morphine, ABG, CXR, D-dimer sent.   PCCM was consulted for medical management of patients Respiratory failure  Pertinent  Medical History  HTN, HLD, HFrEF, CM, CAD, Oxygen dependent 4 Liters (which he rarely uses), GERD, BPH, Depression, Hypothyroidism  Significant Hospital Events: Including procedures, antibiotic start and stop dates in addition to other pertinent events   3/24: Admitted to the hospitalist service with CAP w/ respiratory failure. Abx and HFNC. Rapid response, Bipap 100% and transferred to the ICU 3/26 remains on vent, severe hypoxia 3/27 remains on vent severe hypoxia 3/28 remains on vent, severe hypoxia 3/29 severe hypoxia 3/30 severe hypoixa  Interim History / Subjective:  Remains critically ill Remains intubated Severe hypoxia Requires VENT support for survival Severe Subcutaneous emphysema  Vent Mode: PRVC FiO2 (%):  [45 %-65 %] 45 % Set Rate:  [20 bmp] 20 bmp Vt Set:  [500 mL] 500 mL PEEP:  [8 cmH20-10  cmH20] 8 cmH20 Plateau Pressure:  [25 cmH20-26 cmH20] 25 cmH20   Objective   Blood pressure (!) 146/56, pulse 75, temperature 99.1 F (37.3 C), temperature source Oral, resp. rate (!) 24, height 6' (1.829 m), weight 73.2 kg, SpO2 94%.    Vent Mode: PRVC FiO2 (%):  [45 %-65 %] 45 % Set Rate:  [20 bmp] 20 bmp Vt Set:  [500 mL] 500 mL PEEP:  [8 cmH20-10 cmH20] 8 cmH20 Plateau Pressure:  [25 cmH20-26 cmH20] 25 cmH20   Intake/Output Summary (Last 24 hours) at 03/17/2024 0732 Last data filed at 03/17/2024 0700 Gross per 24 hour  Intake 2899.2 ml  Output 2225 ml  Net 674.2 ml   Filed Weights   03/15/24 0500 03/16/24 0152 03/17/24 0315  Weight: 73.3 kg 73.4 kg 73.2 kg   Antibiotics Given (last 72 hours)     Date/Time Action Medication Dose Rate   03/14/24 2153 New Bag/Given   cefTRIAXone (ROCEPHIN) 2 g in sodium chloride 0.9 % 100 mL IVPB 2 g 200 mL/hr   03/14/24 2344 New Bag/Given   azithromycin (ZITHROMAX) 500 mg in sodium chloride 0.9 % 250 mL IVPB 500 mg 250 mL/hr         REVIEW OF SYSTEMS  PATIENT IS UNABLE TO PROVIDE COMPLETE REVIEW OF SYSTEMS DUE TO SEVERE CRITICAL ILLNESS   PHYSICAL EXAMINATION:  GENERAL:critically ill appearing, +resp distress EYES: Pupils equal, round, reactive to light.  No scleral icterus.  MOUTH: Moist mucosal membrane. INTUBATED NECK: Supple.  PULMONARY: Lungs clear to auscultation, +rhonchi, +wheezing CARDIOVASCULAR: S1 and S2.  Regular rate and rhythm GASTROINTESTINAL:  Soft, nontender, -distended. Positive bowel sounds.  MUSCULOSKELETAL: No swelling, clubbing, or edema.  NEUROLOGIC: obtunded,sedated SKIN:normal, warm to touch, Capillary refill delayed  Pulses present bilaterally Upper chest and neck subcutaneous emphysema   Assessment & Plan:   83 yo white male with end stage lung disease with acute severe hypoxic resp failure ,Due to Community Acquired PNA,Influenza A with ALI/ARDS, Failure to wean from vent  Severe ACUTE Hypoxic  and Hypercapnic Respiratory Failure -continue Mechanical Ventilator support -Wean Fio2 and PEEP as tolerated -VAP/VENT bundle implementation - Wean PEEP & FiO2 as tolerated, maintain SpO2 > 88% - Head of bed elevated 30 degrees, VAP protocol in place - Plateau pressures less than 30 cm H20  - Intermittent chest x-ray & ABG PRN - Ensure adequate pulmonary hygiene  Unable to wean from vent today, increased WOB, hypoxia CT chest extensive b/l infiltrates  INFECTIOUS DISEASE -continue antibiotics as prescribed -follow up cultures -Solumedrol 40 mg Q 12 hours -Azithromycin + Ceftriaxone    Paroxysmal atrial fibrillation HFrEF, 3/23 EF 55% -Continue amiodarone and aldactone  RENAL -continue Foley Catheter-assess need -Avoid nephrotoxic agents -Follow urine output, BMP -Ensure adequate renal perfusion, optimize oxygenation -Renal dose medications   Intake/Output Summary (Last 24 hours) at 03/17/2024 0734 Last data filed at 03/17/2024 0700 Gross per 24 hour  Intake 2899.2 ml  Output 2225 ml  Net 674.2 ml       Latest Ref Rng & Units 03/17/2024    3:11 AM 03/16/2024   10:03 PM 03/16/2024    6:22 AM  BMP  Glucose 70 - 99 mg/dL 782  956  213   BUN 8 - 23 mg/dL 086  578  93   Creatinine 0.61 - 1.24 mg/dL 4.69  6.29  5.28   Sodium 135 - 145 mmol/L 138  139  138   Potassium 3.5 - 5.1 mmol/L 5.2  5.4  5.2   Chloride 98 - 111 mmol/L 97  96  96   CO2 22 - 32 mmol/L 35  37  35   Calcium 8.9 - 10.3 mg/dL 7.9  8.1  8.3      NEUROLOGY ACUTE METABOLIC ENCEPHALOPATHY -need for sedation -Goal RASS -2 to -3   ENDO - ICU hypoglycemic\Hyperglycemia protocol -check FSBS per protocol   GI GI PROPHYLAXIS as indicated NUTRITIONAL STATUS DIET-->TF's as tolerated Constipation protocol as indicated   ELECTROLYTES -follow labs as needed -replace as needed -pharmacy consultation and following  RESTRICTIVE TRANSFUSION PROTOCOL TRANSFUSION  IF HGB<7  or ACTIVE BLEEDING OR DX of  ACUTE CORONARY SYNDROMES  Best Practice (right click and "Reselect all SmartList Selections" daily)   Diet/type: NPO w/ oral meds pending improved respiratory status DVT prophylaxis SCD, Lovenox Pressure ulcer(s): N/A GI prophylaxis: Pepcid Lines: N/A Foley:  N/A Code Status:  full code  Labs   CBC: Recent Labs  Lab 03/10/24 2037 03/11/24 0516 03/13/24 0427 03/14/24 0548 03/15/24 0212 03/16/24 0622 03/17/24 0311  WBC 10.0   < > 16.5* 20.3* 21.1* 26.9* 26.1*  NEUTROABS 8.3*  --   --   --   --   --   --   HGB 13.6   < > 13.7 12.3* 12.7* 13.6 13.2  HCT 40.6   < > 40.2 37.3* 38.6* 42.2 42.4  MCV 95.8   < > 96.6 95.6 97.0 98.8 100.5*  PLT 286   < > 286 307 307 453* 415*   < > = values in this interval not displayed.   Basic Metabolic Panel: Recent  Labs  Lab 03/13/24 0427 03/14/24 0548 03/15/24 0212 03/15/24 1411 03/16/24 0622 03/16/24 2203 03/17/24 0311  NA 140 138 139 140 138 139 138  K 3.5 3.7 4.5 4.9 5.2* 5.4* 5.2*  CL 103 99 99 94* 96* 96* 97*  CO2 28 31 34* 35* 35* 37* 35*  GLUCOSE 193* 179* 221* 235* 203* 249* 241*  BUN 24* 37* 55* 65* 93* 129* 104*  CREATININE 0.95 0.86 0.83 0.92 0.97 1.04 1.00  CALCIUM 8.2* 8.3* 8.0* 8.2* 8.3* 8.1* 7.9*  MG 2.3 2.6* 3.1*  --  3.4*  --  3.3*  PHOS 4.0 2.6 2.0*  --  2.3*  --  2.5   GFR: Estimated Creatinine Clearance: 59 mL/min (by C-G formula based on SCr of 1 mg/dL). Recent Labs  Lab 03/10/24 2037 03/11/24 0516 03/12/24 0604 03/13/24 0427 03/14/24 0548 03/15/24 0212 03/16/24 0622 03/17/24 0311  PROCALCITON 0.10  --  <0.10  --   --   --   --   --   WBC 10.0   < > 12.0*   < > 20.3* 21.1* 26.9* 26.1*  LATICACIDVEN  --   --  1.2  --   --   --   --   --    < > = values in this interval not displayed.   ABG    Component Value Date/Time   PHART 7.3 (L) 03/15/2024 1334   PCO2ART 63 (H) 03/15/2024 1334   PO2ART 74 (L) 03/15/2024 1334   HCO3 31.0 (H) 03/15/2024 1334   TCO2 26 09/07/2007 1236   O2SAT PENDING  03/15/2024 1334    HbA1C: Hgb A1c MFr Bld  Date/Time Value Ref Range Status  07/26/2023 09:57 AM 6.1 (H) 4.8 - 5.6 % Final    Comment:             Prediabetes: 5.7 - 6.4          Diabetes: >6.4          Glycemic control for adults with diabetes: <7.0   12/30/2020 04:17 AM 5.5 4.8 - 5.6 % Final    Comment:    (NOTE) Pre diabetes:          5.7%-6.4%  Diabetes:              >6.4%  Glycemic control for   <7.0% adults with diabetes    ABG: Recent Labs  Lab 03/16/24 1540 03/16/24 1918 03/17/24 0013 03/17/24 0348 03/17/24 0724  GLUCAP 195* 200* 247* 224* 193*         DVT/GI PRX  assessed I Assessed the need for Labs I Assessed the need for Foley I Assessed the need for Central Venous Line Family Discussion when available I Assessed the need for Mobilization I made an Assessment of medications to be adjusted accordingly Safety Risk assessment completed  CASE DISCUSSED IN MULTIDISCIPLINARY ROUNDS WITH ICU TEAM     Critical Care Time devoted to patient care services described in this note is 55 minutes.  Critical care was necessary to treat /prevent imminent and life-threatening deterioration. Overall, patient is critically ill, prognosis is guarded.  Patient with Multiorgan failure and at high risk for cardiac arrest and death.    Lucie Leather, M.D.  [facility]  Medical Director [facility] Medical Director [facility] Cardio-Pulmonary Department

## 2024-03-17 NOTE — Progress Notes (Signed)
 Daily Progress Note   Patient Name: Nathaniel Ibarra       Date: 03/17/2024 DOB: 18-Mar-1941  Age: 83 y.o. MRN#: [id number] Attending Physician: Erin Fulling, MD Primary Care Physician: Sherron Monday, MD Admit Date: 03/10/2024  Reason for Consultation/Follow-up: Establishing goals of care  HPI/Brief Hospital Review: 83 y.o. male  with past medical history of HFrEF, cardiomyopathy, BPH, HTN, HLD, CAD and hypothyroidism admitted from home on 03/10/2024 with worsening dyspnea, cough and wheezing over the last week. Diagnosed with influenza and PNA about 1 week prior, completed course of Tamiflu and antibiotics.   Found to be hypoxic, admitted and being treated for acute hypoxic respiratory failure and CAP RRT called 3/25 due to worsening hypoxia-required intubation and transferred to ICU   3/29 remains intubated and sedated in ICU, requiring significant support from mechanical ventilation due to ALI/ARDS   Palliative medicine was consulted for assisting with goals of care conversations.  Subjective: Extensive chart review has been completed prior to meeting patient including labs, vital signs, imaging, progress notes, orders, and available advanced directive documents from current and previous encounters.    Visited with Nathaniel Ibarra at his bedside, remains intubated and sedated.  Called and met with wife and son today. Reviewed that earlier today Nathaniel Ibarra tolerating Fi02 wean fairly well but unfortunately had a significant desaturation event and is now requiring maximum ventilatory support (100% Fio2). We again discussed the difference between quality versus quantity of life and the importance of making decisions based on what Nathaniel Ibarra would make for himself. We also discussed the  severity of his condition and the set back he suffered today.  Wife discusses being open to considering transition to comfort care as she no longer wishes for Nathaniel Ibarra to suffer but needing time to allow her son-Dean to come to a place of acceptance.  Answered and addressed all questions and concerns. PMT to continue to follow for ongoing needs and support.  Objective:        Vital Signs: BP (!) 147/59   Pulse 72   Temp 98.3 F (36.8 C) (Oral)   Resp (!) 24   Ht 6' (1.829 m)   Wt 73.2 kg   SpO2 94%   BMI 21.89 kg/m  SpO2: SpO2: 94 % O2 Device: O2 Device: Ventilator O2 Flow Rate:  O2 Flow Rate (L/min): 60 L/min   Palliative Care Assessment & Plan   Assessment/Recommendation/Plan  Ongoing GOC needed with family, time for outcomes  Care plan was discussed with nursing staff and CCM team  Thank you for allowing the Palliative Medicine Team to assist in the care of this patient.  Total time:  50 minutes  Time spent includes: Detailed review of medical records (labs, imaging, vital signs), medically appropriate exam (mental status, respiratory, cardiac, skin), discussed with treatment team, counseling and educating patient, family and staff, documenting clinical information, medication management and coordination of care.  Leeanne Deed, DNP, AGNP-C Palliative Medicine   Please contact Palliative Medicine Team phone at [phone] for questions and concerns.

## 2024-03-17 NOTE — Progress Notes (Signed)
 Per Benita Gutter, NP okay to restart tube feeds @ 60. Pt SpO2 reading 92% on 100% FiO2.

## 2024-03-17 NOTE — Plan of Care (Addendum)
 Ring removed from left hand finger. Endorsed to Morning nurse to be given to relatives.  Problem: Clinical Measurements: Goal: Cardiovascular complication will be avoided Outcome: Progressing   Problem: Activity: Goal: Risk for activity intolerance will decrease Outcome: Progressing   Problem: Nutrition: Goal: Adequate nutrition will be maintained Outcome: Progressing

## 2024-03-17 NOTE — Progress Notes (Signed)
 Called to bedside by Son with questions regarding patient's current condition. Sudden drop in oxygen as RN entered room to 78% from 92% patient maintaining throughout the shift thus far today. Several different pulse ox applied to patient with average reading obtained at 78%. RT to bedside to suction and assess patient. RT increased Fi02 until patient's saturation increased to 92%, requiring 100% Fi02. MD Kasa to bedside to assess patient and view STAT chest xray. No pneumothorax present per MD. Patient currently continues to require 100% Fi02. RN will continue to monitor closely.

## 2024-03-17 NOTE — Progress Notes (Signed)
 Patient belongings Nathaniel Ibarra wedding band and cell phone) sent home with wife today. RN gave belongings to wife in clear bag to take home. Wife appreciative and put patient belongings in purse to take home.

## 2024-03-18 ENCOUNTER — Inpatient Hospital Stay

## 2024-03-18 DIAGNOSIS — J80 Acute respiratory distress syndrome: Secondary | ICD-10-CM

## 2024-03-18 DIAGNOSIS — E440 Moderate protein-calorie malnutrition: Secondary | ICD-10-CM

## 2024-03-18 DIAGNOSIS — J9601 Acute respiratory failure with hypoxia: Secondary | ICD-10-CM | POA: Diagnosis not present

## 2024-03-18 DIAGNOSIS — I5030 Unspecified diastolic (congestive) heart failure: Secondary | ICD-10-CM

## 2024-03-18 DIAGNOSIS — J09X1 Influenza due to identified novel influenza A virus with pneumonia: Secondary | ICD-10-CM | POA: Diagnosis not present

## 2024-03-18 DIAGNOSIS — Z7189 Other specified counseling: Secondary | ICD-10-CM | POA: Diagnosis not present

## 2024-03-18 DIAGNOSIS — G928 Other toxic encephalopathy: Secondary | ICD-10-CM

## 2024-03-18 DIAGNOSIS — I480 Paroxysmal atrial fibrillation: Secondary | ICD-10-CM | POA: Diagnosis not present

## 2024-03-18 DIAGNOSIS — J101 Influenza due to other identified influenza virus with other respiratory manifestations: Secondary | ICD-10-CM

## 2024-03-18 DIAGNOSIS — L8990 Pressure ulcer of unspecified site, unspecified stage: Secondary | ICD-10-CM | POA: Insufficient documentation

## 2024-03-18 LAB — GLUCOSE, CAPILLARY
Glucose-Capillary: 222 mg/dL — ABNORMAL HIGH (ref 70–99)
Glucose-Capillary: 204 mg/dL — ABNORMAL HIGH (ref 70–99)
Glucose-Capillary: 232 mg/dL — ABNORMAL HIGH (ref 70–99)
Glucose-Capillary: 222 mg/dL — ABNORMAL HIGH (ref 70–99)
Glucose-Capillary: 168 mg/dL — ABNORMAL HIGH (ref 70–99)
Glucose-Capillary: 201 mg/dL — ABNORMAL HIGH (ref 70–99)

## 2024-03-18 LAB — CBC
HCT: 44.1 % (ref 39.0–52.0)
Hemoglobin: 13.8 g/dL (ref 13.0–17.0)
MCH: 31.9 pg (ref 26.0–34.0)
MCHC: 31.3 g/dL (ref 30.0–36.0)
MCV: 102.1 fL — ABNORMAL HIGH (ref 80.0–100.0)
Platelets: 432 10*3/uL — ABNORMAL HIGH (ref 150–400)
RBC: 4.32 MIL/uL (ref 4.22–5.81)
RDW: 13.8 % (ref 11.5–15.5)
WBC: 33.1 10*3/uL — ABNORMAL HIGH (ref 4.0–10.5)
nRBC: 0 % (ref 0.0–0.2)

## 2024-03-18 LAB — BLOOD GAS, ARTERIAL
Acid-Base Excess: 15 mmol/L — ABNORMAL HIGH (ref 0.0–2.0)
Bicarbonate: 43.6 mmol/L — ABNORMAL HIGH (ref 20.0–28.0)
MECHVT: 500 mL
O2 Saturation: 95.1 %
PEEP: 10 cmH2O
Patient temperature: 37
RATE: 20 {breaths}/min
pCO2 arterial: 72 mmHg (ref 32–48)
pH, Arterial: 7.39 (ref 7.35–7.45)
pO2, Arterial: 84 mmHg (ref 83–108)

## 2024-03-18 LAB — BASIC METABOLIC PANEL WITH GFR
Anion gap: 9 (ref 5–15)
BUN: 112 mg/dL — ABNORMAL HIGH (ref 8–23)
CO2: 35 mmol/L — ABNORMAL HIGH (ref 22–32)
Calcium: 8.2 mg/dL — ABNORMAL LOW (ref 8.9–10.3)
Chloride: 97 mmol/L — ABNORMAL LOW (ref 98–111)
Creatinine, Ser: 0.94 mg/dL (ref 0.61–1.24)
GFR, Estimated: >60 mL/min (ref >60)
Glucose, Bld: 209 mg/dL — ABNORMAL HIGH (ref 70–99)
Potassium: 5.5 mmol/L — ABNORMAL HIGH (ref 3.5–5.1)
Sodium: 141 mmol/L (ref 135–145)

## 2024-03-18 LAB — MAGNESIUM: Magnesium: 3.8 mg/dL — ABNORMAL HIGH (ref 1.7–2.4)

## 2024-03-18 LAB — PHOSPHORUS: Phosphorus: 5.2 mg/dL — ABNORMAL HIGH (ref 2.5–4.6)

## 2024-03-18 LAB — PROCALCITONIN: Procalcitonin: 0.11 ng/mL

## 2024-03-18 MED ORDER — SODIUM ZIRCONIUM CYCLOSILICATE 5 G PO PACK
10.0000 g | PACK | Freq: Three times a day (TID) | ORAL | Status: AC
  Administered 2024-03-18 (×3): 10 g
  Filled 2024-03-18 (×3): qty 2

## 2024-03-18 MED ORDER — PANTOPRAZOLE SODIUM 40 MG IV SOLR
40.0000 mg | Freq: Every day | INTRAVENOUS | Status: DC
  Administered 2024-03-18 – 2024-03-23 (×6): 40 mg via INTRAVENOUS
  Filled 2024-03-18 (×6): qty 10

## 2024-03-18 NOTE — Plan of Care (Signed)

## 2024-03-18 NOTE — Progress Notes (Addendum)
 Daily Progress Note   Patient Name: Nathaniel Ibarra       Date: 03/18/2024 DOB: 1941-05-14  Age: 83 y.o. MRN#: [id number] Attending Physician: Raechel Chute, MD Primary Care Physician: Sherron Monday, MD Admit Date: 03/10/2024  Reason for Consultation/Follow-up: Establishing goals of care  Subjective: Up to see patient. He is currently resting in bed with wife at bedside.  She is in conference with another team at this time.  Wife is still deciding on care moving forward.  PMT will follow-up tomorrow for further conversation.  Length of Stay: 8  Current Medications: Scheduled Meds:   amiodarone  200 mg Per Tube Daily   Chlorhexidine Gluconate Cloth  6 each Topical Daily   docusate  100 mg Per Tube BID   enoxaparin (LOVENOX) injection  40 mg Subcutaneous Q24H   famotidine  20 mg Per Tube BID   feeding supplement (PROSource TF20)  60 mL Per Tube Daily   finasteride  5 mg Oral Daily   free water  100 mL Per Tube Q4H   furosemide  40 mg Intravenous Daily   guaiFENesin  400 mg Per Tube BID   insulin aspart  0-20 Units Subcutaneous Q4H   insulin aspart  2 Units Subcutaneous Q4H   ipratropium-albuterol  3 mL Nebulization Q6H   levothyroxine  100 mcg Per Tube Q0600   methylPREDNISolone (SOLU-MEDROL) injection  40 mg Intravenous Q12H   nutrition supplement (JUVEN)  1 packet Per Tube BID BM   mouth rinse  15 mL Mouth Rinse Q2H   PARoxetine  20 mg Per Tube Daily   polyethylene glycol  17 g Per Tube Daily   rosuvastatin  10 mg Per Tube Daily   sodium zirconium cyclosilicate  10 g Per Tube Q8H   tamsulosin  0.8 mg Oral QPC supper    Continuous Infusions:  feeding supplement (VITAL AF 1.2 CAL) 60 mL/hr at 03/18/24 1330   propofol (DIPRIVAN) infusion 50 mcg/kg/min (03/18/24 1552)     PRN Meds: acetaminophen **OR** acetaminophen, chlorpheniramine-HYDROcodone, fentaNYL (SUBLIMAZE) injection, hydrALAZINE, ipratropium-albuterol, magnesium hydroxide, ondansetron **OR** ondansetron (ZOFRAN) IV, mouth rinse, mouth rinse, traZODone  Physical Exam Constitutional:      Comments: Eyes closed  Pulmonary:     Comments: Ventilator required            Vital Signs:  BP (!) 145/55   Pulse 87   Temp 98.1 F (36.7 C) (Oral)   Resp 20   Ht 6' (1.829 m)   Wt 68.4 kg   SpO2 93%   BMI 20.45 kg/m  SpO2: SpO2: 93 % O2 Device: O2 Device: Ventilator O2 Flow Rate: O2 Flow Rate (L/min): 60 L/min  Intake/output summary:  Intake/Output Summary (Last 24 hours) at 03/18/2024 1615 Last data filed at 03/18/2024 1600 Gross per 24 hour  Intake 2752.13 ml  Output 2850 ml  Net -97.87 ml   LBM: Last BM Date : 03/14/24 Baseline Weight: Weight: 86.2 kg Most recent weight: Weight: 68.4 kg    Patient Active Problem List   Diagnosis Date Noted   Malnutrition of moderate degree 03/14/2024   Community acquired pneumonia 03/11/2024   Dyslipidemia 03/11/2024   Multifocal pneumonia 03/11/2024   Change in bowel habits 01/04/2024   Polyp of ascending colon 01/04/2024   Normocytic anemia 03/29/2023   Acute metabolic encephalopathy 03/29/2023   CAP (community acquired pneumonia) 03/28/2023   Sepsis (HCC) 03/28/2023   Paroxysmal atrial fibrillation (HCC) 03/28/2023   Depression 03/28/2023   Abnormal LFTs 03/28/2023   Total knee replacement status, 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   Blood clotting disorder (HCC) 04/07/2022   Primary osteoarthritis of left knee 03/07/2022   Acute respiratory failure with hypoxia (HCC)    Pneumonia due to COVID-19 virus 12/28/2020   Testicular abscess 11/21/2020   Acute lower UTI 11/21/2020   Chronic systolic CHF (congestive heart failure) (HCC) 11/21/2020   AF (paroxysmal atrial fibrillation) (HCC)    Essential hypertension    Hypothyroidism    Acute on chronic  combined systolic and diastolic CHF (congestive heart failure) (HCC) 05/16/2020   Acute on chronic systolic CHF (congestive heart failure) (HCC) 05/16/2020   BPH (benign prostatic hyperplasia)    GERD (gastroesophageal reflux disease)    Hypertensive urgency    Bradycardia 05/08/2018   Situational depression 05/08/2018   CHF (congestive heart failure) (HCC) 11/13/2017   Atrial fibrillation with RVR (HCC) 11/12/2017   Pulmonary edema cardiac cause (HCC) 11/12/2017   Elevated troponin 11/12/2017   Coronary artery disease 09/02/2015   Acute coronary syndrome (HCC) 09/01/2015   Unstable angina (HCC) 09/01/2015   Chest pain 08/16/2015    Palliative Care Assessment & Plan    Recommendations/Plan: PMT will follow-up tomorrow.  Code Status:    Code Status Orders  (From admission, onward)           Start     Ordered   03/16/24 1248  Do not attempt resuscitation (DNR) Pre-Arrest Interventions Desired  (Code Status)  Continuous       Question Answer Comment  If pulseless and not breathing No CPR or chest compressions.   In Pre-Arrest Conditions (Patient Has Pulse and Is Breathing) May intubate, use advanced airway interventions and cardioversion/ACLS medications if appropriate or indicated. May transfer to ICU.   Consent: Discussion documented in EHR or advanced directives reviewed      03/16/24 1247           Code Status History     Date Active Date Inactive Code Status Order ID Comments User Context   03/10/2024 2223 03/16/2024 1247 Full Code [id number]  [facility]ville Care Vernetta Honey, MD ED   03/28/2023 1324 04/03/2023 2018 Full Code [id number]  Lorretta Harp, MD ED   10/24/2022 1137 10/25/2022 2323 Full Code [id number]  Donato Heinz, MD Inpatient   12/28/2020 1950 01/13/2021 2155 Full Code [id number]  Mansy, Vernetta Honey, MD  ED   11/21/2020 2009 11/27/2020 1945 Full Code [id number]  Hillary Bow, DO ED   05/16/2020 1227 05/18/2020 1503 Full Code [id number]  Lucile Shutters, MD ED   11/12/2017 1859  11/17/2017 2048 Full Code [id number]  Marguarite Arbour, MD Inpatient   09/01/2015 1337 09/02/2015 1353 Full Code [id number]  Alwyn Pea, MD Inpatient   09/01/2015 1331 09/01/2015 1337 Full Code [id number]  Laurier Nancy, MD Inpatient   09/01/2015 1254 09/01/2015 1331 Full Code [id number]  Laurier Nancy, MD Inpatient   08/16/2015 1631 08/17/2015 1627 Full Code [id number]  Houston Siren, MD Inpatient      Advance Directive Documentation    Flowsheet Row Most Recent Value  Type of Advance Directive Healthcare Power of Attorney  Pre-existing out of facility DNR order (yellow form or pink MOST form) --  "MOST" Form in Place? --       Thank you for allowing the Palliative Medicine Team to assist in the care of this patient.   Morton Stall, NP  Please contact Palliative Medicine Team phone at [phone] for questions and concerns.

## 2024-03-18 NOTE — Progress Notes (Signed)
 NAME:  Nathaniel Ibarra, MRN:  [id number], DOB:  1941/05/06, LOS: 8 ADMISSION DATE:  03/10/2024, CHIEF COMPLAINT:  Respiratory Failure, ARDS   History of Present Illness:   Nathaniel Ibarra is an 83 yo with a PMH significant for HTN, HLD, HFrEF, CM, CAD, Oxygen dependent 4 Liters (which he rarely uses), GERD, BPH, Depression, Hypothyroidism that presented to the ER with worsening dyspnea.  Patient previously tested positive for Influenza A with associated PNA 03/03/24 and completed a course of Tamiflu, prednisone taper, and antibiotics. Upon evaluation in the ER he was afebrile, 97% on 8 Liters, CXR low lung volumes with bronchitic changes and questionable developing right midlung zone and left mid to lower lung zone airspace opacities. He was started on antibiotics and HFNC, cultures sent. Condition worsened and a rapid response was called secondary to increased oxygen requirements and work of breath. Was placed on 100% bipap, lasix 40 mg x 1, Morphine, ABG, CXR, D-dimer sent. PCCM was consulted for medical management of patients Respiratory failure. Patient was intubated on 3/25 secondary to worsening respiratory status and worsening hypoxemia.  Pertinent  Medical History  HTN, HLD, HFrEF, CM, CAD, Oxygen dependent 4 Liters (which he rarely uses), GERD, BPH, Depression, Hypothyroidism    Significant Hospital Events: Including procedures, antibiotic start and stop dates in addition to other pertinent events   3/24: admit to [facility] for respiratory failure, HFNC to BiPAP, transfer to ICU 3/25: in ICU with worsening oxygen requirements, intubated 3/26: vented 3/28: palliative care consulted 3/31: vented, wife updated at the bedside, remains sedated  Interim History / Subjective:  CXR yesterday with possible small left sided pneumothorax. Remains vented, with severe oxygenation deficit.  Objective   Blood pressure (!) 145/55, pulse 87, temperature 98.1 F (36.7 C), temperature source Oral, resp.  rate 20, height 6' (1.829 m), weight 68.4 kg, SpO2 93%.    Vent Mode: PRVC FiO2 (%):  [100 %] 100 % Set Rate:  [20 bmp] 20 bmp Vt Set:  [500 mL] 500 mL PEEP:  [10 cmH20] 10 cmH20 Plateau Pressure:  [22 cmH20] 22 cmH20   Intake/Output Summary (Last 24 hours) at 03/18/2024 1532 Last data filed at 03/18/2024 1500 Gross per 24 hour  Intake 2953.75 ml  Output 2675 ml  Net 278.75 ml   Filed Weights   03/16/24 0152 03/17/24 0315 03/18/24 0453  Weight: 73.4 kg 73.2 kg 68.4 kg    Examination: Physical Exam Constitutional:      General: He is not in acute distress.    Appearance: He is ill-appearing.  HENT:     Mouth/Throat:     Mouth: Mucous membranes are moist.     Comments: ETT in place Cardiovascular:     Rate and Rhythm: Regular rhythm. Tachycardia present.     Pulses: Normal pulses.     Heart sounds: Normal heart sounds.  Pulmonary:     Breath sounds: Rhonchi present. No wheezing.  Neurological:     Mental Status: He is disoriented.      Assessment & Plan:   83 year old male with recent influenza infection presenting with worsening hypoxic respiratory failure requiring intubation and mechanical ventilation.  #Acute Hypoxic Respiratory Failure #Influenza A Pneumonia #Acute Respiratory Distress Syndrome #Superimposed Bacterial Pneumonia #Subcutaneous Emphysema #Atrial Fibrillation #Toxic Metabolic Encephalopathy #HFpEF #Hypothyroidism  Neuro: Toxic metabolic encephalopathy secondary to severe pneumonia as well as sedation needs in the setting of severe ARDS. Currently on propofol and fentanyl for analgosedation. Goal RASS -3. CV: Has a history of  HFpEF, with recent echocardiogram showing elevated PASP which could be secondary to increased PVR or heart failure. Maintained on 40 mg of IV furosemide daily with overall goal net even which he is maintaining. Also on amiodarone which he takes outpatient for atrial fibrillation. Pulm: Acute hypoxic respiratory failure with  severe ARDS (P:F 84 this AM) secondary to influenza pneumonia and likely superimposed bacterial infection. There is significant subcutaneous emphysema limiting our ability to increase PEEP to optimize ARDS management. Will obtain a chest CT to assess for pneumothorax, decrease tidal volume to 465 mL, and initiate prone positioning to support oxygenation.  ARDS management and interventions: -Prone: not initiated, will consider today -Pee: 40 IV lasix daily -PEEP: 10 cmH2O, limited by subQ emphysema -Paralysis: not initiated -Pulmonary vasodilators: not initiated  -Perfusion & ECMO: not a candidate given age -Permissive hypercapnia: allowing -Low tidal volume ventilation: 465 mL, currently at 500 -Steroids: on methylpred  GI: On tube feeds, switch famotidine to PPI for SUP Renal: kidney function appears to be within normal though his BUN is significantly elevated. Continue with gentle diuresis and attempt to avoid nephrotoxins Endo: On levothyroxine for hypothyroidism. On ICU glycemic protocol. On methylprednisolone for respiratory failure which we'll continue. Hem/Onc: Enoxaparin subQ for DVT prophylaxis. Does not appear to be anticoagulated outpatient. ID: Influenza A infection with possible superimposed bacterial infection resulting in severe pneumonia. He has finished a course of antibiotics on admission, with cultures not isolating any organisms. Will await cultures to finalize and check procalcitonin.  Best Practice (right click and "Reselect all SmartList Selections" daily)   Diet/type: tubefeeds DVT prophylaxis LMWH Pressure ulcer(s): N/A GI prophylaxis: PPI Lines: N/A Foley:  Yes, and it is still needed Code Status:  DNR Last date of multidisciplinary goals of care discussion [03/18/2024]  Labs   CBC: Recent Labs  Lab 03/14/24 0548 03/15/24 0212 03/16/24 0622 03/17/24 0311 03/18/24 0305  WBC 20.3* 21.1* 26.9* 26.1* 33.1*  HGB 12.3* 12.7* 13.6 13.2 13.8  HCT 37.3* 38.6*  42.2 42.4 44.1  MCV 95.6 97.0 98.8 100.5* 102.1*  PLT 307 307 453* 415* 432*    Basic Metabolic Panel: Recent Labs  Lab 03/14/24 0548 03/15/24 0212 03/15/24 1411 03/16/24 0622 03/16/24 2203 03/17/24 0311 03/18/24 0305  NA 138 139 140 138 139 138 141  K 3.7 4.5 4.9 5.2* 5.4* 5.2* 5.5*  CL 99 99 94* 96* 96* 97* 97*  CO2 31 34* 35* 35* 37* 35* 35*  GLUCOSE 179* 221* 235* 203* 249* 241* 209*  BUN 37* 55* 65* 93* 129* 104* 112*  CREATININE 0.86 0.83 0.92 0.97 1.04 1.00 0.94  CALCIUM 8.3* 8.0* 8.2* 8.3* 8.1* 7.9* 8.2*  MG 2.6* 3.1*  --  3.4*  --  3.3* 3.8*  PHOS 2.6 2.0*  --  2.3*  --  2.5 5.2*   GFR: Estimated Creatinine Clearance: 58.6 mL/min (by C-G formula based on SCr of 0.94 mg/dL). Recent Labs  Lab 03/12/24 0604 03/13/24 0427 03/15/24 0212 03/16/24 0622 03/17/24 0311 03/18/24 0305  PROCALCITON <0.10  --   --   --   --   --   WBC 12.0*   < > 21.1* 26.9* 26.1* 33.1*  LATICACIDVEN 1.2  --   --   --   --   --    < > = values in this interval not displayed.    Liver Function Tests: Recent Labs  Lab 03/12/24 0604  AST 108*  ALT 147*  ALKPHOS 75  BILITOT 0.5  PROT 5.5*  ALBUMIN 2.2*   No results for input(s): "LIPASE", "AMYLASE" in the last 168 hours. No results for input(s): "AMMONIA" in the last 168 hours.  ABG    Component Value Date/Time   PHART 7.39 03/18/2024 1054   PCO2ART 72 (HH) 03/18/2024 1054   PO2ART 84 03/18/2024 1054   HCO3 43.6 (H) 03/18/2024 1054   TCO2 26 09/07/2007 1236   O2SAT 95.1 03/18/2024 1054     Coagulation Profile: No results for input(s): "INR", "PROTIME" in the last 168 hours.  Cardiac Enzymes: No results for input(s): "CKTOTAL", "CKMB", "CKMBINDEX", "TROPONINI" in the last 168 hours.  HbA1C: Hgb A1c MFr Bld  Date/Time Value Ref Range Status  07/26/2023 09:57 AM 6.1 (H) 4.8 - 5.6 % Final    Comment:             Prediabetes: 5.7 - 6.4          Diabetes: >6.4          Glycemic control for adults with diabetes: <7.0    12/30/2020 04:17 AM 5.5 4.8 - 5.6 % Final    Comment:    (NOTE) Pre diabetes:          5.7%-6.4%  Diabetes:              >6.4%  Glycemic control for   <7.0% adults with diabetes     CBG: Recent Labs  Lab 03/17/24 1938 03/17/24 2333 03/18/24 0337 03/18/24 0811 03/18/24 1139  GLUCAP 156* 178* 222* 204* 232*    Review of Systems:   Unable to obtain  Past Medical History:  He,  has a past medical history of Aortic atherosclerosis (HCC), Basal cell carcinoma (05/18/2022), Bladder cancer (HCC) (2008), BPH (benign prostatic hyperplasia), Bradycardia, Cardiomyopathy (HCC), Cataract, bilateral, Coronary artery disease, Depression, GERD (gastroesophageal reflux disease), HFrEF (heart failure with reduced ejection fraction) (HCC), History of 2019 novel coronavirus disease (COVID-19), HLD (hyperlipidemia), Hypertension, Hypothyroidism, LBBB (left bundle branch block), Long term current use of amiodarone, NSTEMI (non-ST elevated myocardial infarction) (HCC) (2016), Osteoarthritis, Paroxysmal atrial fibrillation (HCC), Peripheral edema, Pneumonia due to COVID-19 virus (12/2020), Sleep difficulties, 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   Surgical History:   Past Surgical History:  Procedure Laterality Date   BIOPSY  01/04/2024   Procedure: BIOPSY;  Surgeon: Midge Minium, MD;  Location: [facility] ENDOSCOPY;  Service: Endoscopy;;   CARDIAC CATHETERIZATION N/A 09/01/2015   Procedure: Left Heart Cath and Coronary Angiography;  Surgeon: Laurier Nancy, MD;  Location: [facility] INVASIVE CV LAB;  Service: Cardiovascular;  Laterality: N/A;   CARDIAC CATHETERIZATION N/A 09/01/2015   Procedure: Coronary Stent Intervention;  Surgeon: Alwyn Pea, MD;  Location: [facility] INVASIVE CV LAB;  Service: Cardiovascular;  Laterality: N/A;   CATARACT EXTRACTION W/PHACO Left 11/23/2016   Procedure: CATARACT EXTRACTION PHACO AND INTRAOCULAR LENS PLACEMENT (IOC);   Surgeon: Lockie Mola, MD;  Location: [facility];  Service: Ophthalmology;  Laterality: Left;   CATARACT EXTRACTION W/PHACO Right 02/01/2017   Procedure: CATARACT EXTRACTION PHACO AND INTRAOCULAR LENS PLACEMENT (IOC) Complicated  right toric lens;  Surgeon: Lockie Mola, MD;  Location: [facility];  Service: Ophthalmology;  Laterality: Right;  Malyugin toric Lens   COLONOSCOPY WITH PROPOFOL N/A 01/04/2024   Procedure: COLONOSCOPY WITH PROPOFOL;  Surgeon: Midge Minium, MD;  Location: [facility] ENDOSCOPY;  Service: Endoscopy;  Laterality: N/A;   KNEE ARTHROPLASTY Left 10/24/2022   Procedure: COMPUTER ASSISTED TOTAL KNEE ARTHROPLASTY;  Surgeon: Donato Heinz, MD;  Location:  [facility] ORS;  Service: Orthopedics;  Laterality: Left;   LEFT HEART CATH AND CORONARY ANGIOGRAPHY Right 11/14/2017   Procedure: LEFT HEART CATH AND CORONARY ANGIOGRAPHY;  Surgeon: Laurier Nancy, MD;  Location: [facility] INVASIVE CV LAB;  Service: Cardiovascular;  Laterality: Right;   POLYPECTOMY  01/04/2024   Procedure: POLYPECTOMY;  Surgeon: Midge Minium, MD;  Location: [facility] ENDOSCOPY;  Service: Endoscopy;;   SCROTAL EXPLORATION Left 11/24/2020   Procedure: SCROTUM EXPLORATION WITH LEFT ORCHIECTOMY;  Surgeon: Noel Christmas, MD;  Location: [facility] ORS;  Service: Urology;  Laterality: Left;   TONSILLECTOMY       Social History:   reports that he has never smoked. He has never used smokeless tobacco. He reports that he does not drink alcohol and does not use drugs.   Family History:  His family history includes Emphysema in his father and mother.   Allergies No Known Allergies   Home Medications  Prior to Admission medications   Medication Sig Start Date End Date Taking? Authorizing Provider  acetaminophen (TYLENOL) 325 MG tablet Take 2 tablets (650 mg total) by mouth every 6 (six) hours as needed for mild pain or headache (fever >/= 101). 01/13/21  Yes Lonia Blood, MD  albuterol (VENTOLIN HFA) 108  (90 Base) MCG/ACT inhaler Inhale 1 puff into the lungs every 4 (four) hours as needed. 10/27/21  Yes [provider]  amiodarone (PACERONE) 200 MG tablet Take 1 tablet (200 mg total) by mouth daily. 05/18/20  Yes Wieting, Richard, MD  benzonatate (TESSALON) 200 MG capsule Take 200 mg by mouth 3 (three) times daily as needed for cough. 03/03/24  Yes [provider]  famotidine (PEPCID) 20 MG tablet Take 1 tablet (20 mg total) by mouth 2 (two) times daily for 15 days. 05/07/23 03/11/24 Yes Sharman Cheek, MD  finasteride (PROSCAR) 5 MG tablet Take 1 tablet (5 mg total) by mouth daily. 05/18/20  Yes Wieting, Richard, MD  ipratropium (ATROVENT) 0.06 % nasal spray Place 2 sprays into the nose 3 (three) times daily as needed. 03/26/23 03/25/24 Yes [provider]  levothyroxine (SYNTHROID) 100 MCG tablet TAKE 1 TABLET BY MOUTH EVERY DAY IN THE MORNING 02/16/24  Yes Tejan-Sie, Marcelino Freestone, MD  PARoxetine (PAXIL) 20 MG tablet TAKE 1 TABLET BY MOUTH EVERY DAY 12/25/23  Yes Tejan-Sie, Marcelino Freestone, MD  rosuvastatin (CRESTOR) 10 MG tablet Take 1 tablet (10 mg total) by mouth daily. 07/28/23 07/27/24 Yes Sherron Monday, MD  spironolactone (ALDACTONE) 25 MG tablet Take 0.5 tablets (12.5 mg total) by mouth daily. 05/19/20  Yes Wieting, Richard, MD  tamsulosin (FLOMAX) 0.4 MG CAPS capsule Take 0.8 mg by mouth at bedtime.    Yes [provider]  cariprazine (VRAYLAR) 1.5 MG capsule Take 1 capsule (1.5 mg total) by mouth daily. Patient not taking: Reported on 03/11/2024 12/11/23 03/10/24  Sherron Monday, MD  cariprazine (VRAYLAR) 1.5 MG capsule Take 1 capsule (1.5 mg total) by mouth daily for 14 days. Patient not taking: Reported on 03/11/2024 12/11/23 12/25/23  Sherron Monday, MD  naproxen (NAPROSYN) 500 MG tablet Take 500 mg by mouth 2 (two) times daily as needed for mild pain. Patient not taking: Reported on 09/25/2023 02/22/23   [provider]  ondansetron (ZOFRAN-ODT) 4 MG  disintegrating tablet Take 1 tablet (4 mg total) by mouth every 8 (eight) hours as needed for nausea or vomiting. Patient not taking: Reported on 09/25/2023 05/07/23   Sharman Cheek, MD  promethazine-dextromethorphan (PROMETHAZINE-DM) 6.25-15 MG/5ML syrup Take  5 mLs by mouth every 6 (six) hours as needed for cough. Patient not taking: Reported on 09/25/2023 03/26/23   [provider]     Critical care time: 50 minutes    Raechel Chute, MD [facility] 03/18/2024 4:40 PM

## 2024-03-18 NOTE — IPAL (Signed)
  Interdisciplinary Goals of Care Family Meeting   Date carried out: 03/18/2024  Location of the meeting: Bedside  Member's involved: Physician and Family Member or next of kin  Durable Power of Attorney or Environmental health practitioner: Patient's wife, Nathaniel Ibarra.  Discussion: We discussed goals of care for Nathaniel Ibarra .  Discussed with Mrs. Desanctis her husband's overall condition with respiratory failure secondary to influenza pneumonia resulting in ARDS and significant damage to his lungs. Explained that he's still requiring a high degree of ventilator support, which is an overall poor prognosticator. She expressed that he would not want to have a tracheostomy tube placed, and reported to me that she is conflicted as he is her life long partner. She also reported that her son is also conflicted regarding his overall condition. We discussed next steps, and I explained that we can give him time to see what kind of recovery his lungs will exhibit, and we can decide on next steps based on that. She did tell me that she would not want him to suffer.  Code status:   Code Status: Do not attempt resuscitation (DNR) PRE-ARREST INTERVENTIONS DESIRED   Disposition: Continue current acute care  Time spent for the meeting: 15 minutes    Raechel Chute, MD  03/18/2024, 3:17 PM

## 2024-03-18 NOTE — Inpatient Diabetes Management (Signed)
 Inpatient Diabetes Program Recommendations  AACE/ADA: New Consensus Statement on Inpatient Glycemic Control  Target Ranges:  Prepandial:   less than 140 mg/dL      Peak postprandial:   less than 180 mg/dL (1-2 hours)      Critically ill patients:  140 - 180 mg/dL    Latest Reference Range & Units 03/17/24 07:24 03/17/24 11:11 03/17/24 16:02 03/17/24 19:38 03/17/24 23:33 03/18/24 03:37 03/18/24 08:11  Glucose-Capillary 70 - 99 mg/dL 914 (H) 782 (H) 956 (H) 156 (H) 178 (H) 222 (H) 204 (H)   Review of Glycemic Control  Current orders for Inpatient glycemic control: Novolog 0-20 units Q4H, Novolog 2 units Q4H; Solumedrol 40 mg Q12H; Vital @ 60 ml/hr   Inpatient Diabetes Program Recommendations:     Insulin: May want to consider increasing tube feeding coverage to Novolog 4 units Q4H.  Thanks, Orlando Penner, RN, MSN, CDCES Diabetes Coordinator Inpatient Diabetes Program [phone] (Team Pager from 8am to 5pm)

## 2024-03-18 NOTE — Plan of Care (Signed)
  Problem: Clinical Measurements: Goal: Will remain free from infection Outcome: Progressing Goal: Diagnostic test results will improve Outcome: Progressing Goal: Respiratory complications will improve Outcome: Progressing Goal: Cardiovascular complication will be avoided Outcome: Progressing   Problem: Respiratory: Goal: Ability to maintain adequate ventilation will improve Outcome: Progressing   Problem: Respiratory: Goal: Ability to maintain a clear airway and adequate ventilation will improve Outcome: Progressing

## 2024-03-19 ENCOUNTER — Inpatient Hospital Stay

## 2024-03-19 DIAGNOSIS — L8990 Pressure ulcer of unspecified site, unspecified stage: Secondary | ICD-10-CM

## 2024-03-19 DIAGNOSIS — Z7189 Other specified counseling: Secondary | ICD-10-CM | POA: Diagnosis not present

## 2024-03-19 DIAGNOSIS — J9601 Acute respiratory failure with hypoxia: Secondary | ICD-10-CM | POA: Diagnosis not present

## 2024-03-19 LAB — TRIGLYCERIDES: Triglycerides: 209 mg/dL — ABNORMAL HIGH (ref <150)

## 2024-03-19 LAB — RENAL FUNCTION PANEL
Albumin: 2.3 g/dL — ABNORMAL LOW (ref 3.5–5.0)
Anion gap: 8 (ref 5–15)
BUN: 126 mg/dL — ABNORMAL HIGH (ref 8–23)
CO2: 38 mmol/L — ABNORMAL HIGH (ref 22–32)
Calcium: 8.3 mg/dL — ABNORMAL LOW (ref 8.9–10.3)
Chloride: 98 mmol/L (ref 98–111)
Creatinine, Ser: 1.03 mg/dL (ref 0.61–1.24)
GFR, Estimated: >60 mL/min (ref >60)
Glucose, Bld: 210 mg/dL — ABNORMAL HIGH (ref 70–99)
Phosphorus: 4.8 mg/dL — ABNORMAL HIGH (ref 2.5–4.6)
Potassium: 5.4 mmol/L — ABNORMAL HIGH (ref 3.5–5.1)
Sodium: 144 mmol/L (ref 135–145)

## 2024-03-19 LAB — BLOOD GAS, ARTERIAL
Acid-Base Excess: 17.6 mmol/L — ABNORMAL HIGH (ref 0.0–2.0)
Bicarbonate: 48.5 mmol/L — ABNORMAL HIGH (ref 20.0–28.0)
FIO2: 70 %
MECHVT: 450 mL
Mechanical Rate: 22
O2 Saturation: 93.8 %
PEEP: 10 cmH2O
Patient temperature: 37
pCO2 arterial: 92 mmHg (ref 32–48)
pH, Arterial: 7.33 — ABNORMAL LOW (ref 7.35–7.45)
pO2, Arterial: 74 mmHg — ABNORMAL LOW (ref 83–108)

## 2024-03-19 LAB — CBC
HCT: 42.1 % (ref 39.0–52.0)
Hemoglobin: 13.3 g/dL (ref 13.0–17.0)
MCH: 31.9 pg (ref 26.0–34.0)
MCHC: 31.6 g/dL (ref 30.0–36.0)
MCV: 101 fL — ABNORMAL HIGH (ref 80.0–100.0)
Platelets: 411 10*3/uL — ABNORMAL HIGH (ref 150–400)
RBC: 4.17 MIL/uL — ABNORMAL LOW (ref 4.22–5.81)
RDW: 13.9 % (ref 11.5–15.5)
WBC: 33.3 10*3/uL — ABNORMAL HIGH (ref 4.0–10.5)
nRBC: 0.1 % (ref 0.0–0.2)

## 2024-03-19 LAB — GLUCOSE, CAPILLARY
Glucose-Capillary: 183 mg/dL — ABNORMAL HIGH (ref 70–99)
Glucose-Capillary: 193 mg/dL — ABNORMAL HIGH (ref 70–99)
Glucose-Capillary: 198 mg/dL — ABNORMAL HIGH (ref 70–99)
Glucose-Capillary: 215 mg/dL — ABNORMAL HIGH (ref 70–99)
Glucose-Capillary: 229 mg/dL — ABNORMAL HIGH (ref 70–99)
Glucose-Capillary: 283 mg/dL — ABNORMAL HIGH (ref 70–99)
Glucose-Capillary: 314 mg/dL — ABNORMAL HIGH (ref 70–99)

## 2024-03-19 MED ORDER — PIPERACILLIN-TAZOBACTAM 3.375 G IVPB
3.3750 g | Freq: Three times a day (TID) | INTRAVENOUS | Status: DC
Start: 2024-03-19 — End: 2024-03-24
  Administered 2024-03-19 – 2024-03-24 (×14): 3.375 g via INTRAVENOUS
  Filled 2024-03-19 (×14): qty 50

## 2024-03-19 MED ORDER — SODIUM ZIRCONIUM CYCLOSILICATE 5 G PO PACK
10.0000 g | PACK | Freq: Once | ORAL | Status: AC
  Administered 2024-03-19: 10 g
  Filled 2024-03-19: qty 2

## 2024-03-19 NOTE — Progress Notes (Addendum)
 Daily Progress Note   Patient Name: Nathaniel Ibarra       Date: 03/19/2024 DOB: 1941-04-01  Age: 83 y.o. MRN#: [id number] Attending Physician: Raechel Chute, MD Primary Care Physician: Sherron Monday, MD Admit Date: 03/10/2024  Reason for Consultation/Follow-up: Establishing goals of care  Subjective: Notes and labs reviewed.  Patient is resting in bed ventilator.  Into bedside, no family present.  Discussed updates with nursing.  Will need to reach out to family to discuss the time to speak further on care.  Length of Stay: 9  Current Medications: Scheduled Meds:   amiodarone  200 mg Per Tube Daily   Chlorhexidine Gluconate Cloth  6 each Topical Daily   docusate  100 mg Per Tube BID   enoxaparin (LOVENOX) injection  40 mg Subcutaneous Q24H   feeding supplement (PROSource TF20)  60 mL Per Tube Daily   free water  100 mL Per Tube Q4H   furosemide  40 mg Intravenous Daily   insulin aspart  0-20 Units Subcutaneous Q4H   insulin aspart  2 Units Subcutaneous Q4H   ipratropium-albuterol  3 mL Nebulization Q6H   levothyroxine  100 mcg Per Tube Q0600   methylPREDNISolone (SOLU-MEDROL) injection  40 mg Intravenous Q12H   nutrition supplement (JUVEN)  1 packet Per Tube BID BM   mouth rinse  15 mL Mouth Rinse Q2H   pantoprazole (PROTONIX) IV  40 mg Intravenous QHS   PARoxetine  20 mg Per Tube Daily   polyethylene glycol  17 g Per Tube Daily   rosuvastatin  10 mg Per Tube Daily    Continuous Infusions:  feeding supplement (VITAL AF 1.2 CAL) Stopped (03/19/24 1400)   propofol (DIPRIVAN) infusion 40 mcg/kg/min (03/19/24 1533)    PRN Meds: acetaminophen **OR** acetaminophen, chlorpheniramine-HYDROcodone, fentaNYL (SUBLIMAZE) injection, ipratropium-albuterol, magnesium hydroxide,  ondansetron **OR** ondansetron (ZOFRAN) IV, mouth rinse, mouth rinse  Physical Exam Constitutional:      Comments: Eyes closed  Pulmonary:     Comments: On ventilator            Vital Signs: BP (!) 142/56   Pulse 82   Temp 98.2 F (36.8 C) (Oral)   Resp (!) 24   Ht 6' (1.829 m)   Wt 87.1 kg   SpO2 94%   BMI 26.04 kg/m  SpO2: SpO2:  94 % O2 Device: O2 Device: Ventilator O2 Flow Rate: O2 Flow Rate (L/min): 60 L/min  Intake/output summary:  Intake/Output Summary (Last 24 hours) at 03/19/2024 1558 Last data filed at 03/19/2024 1533 Gross per 24 hour  Intake 3227.85 ml  Output 2500 ml  Net 727.85 ml   LBM: Last BM Date : 03/14/24 Baseline Weight: Weight: 86.2 kg Most recent weight: Weight: 87.1 kg   Patient Active Problem List   Diagnosis Date Noted   Influenza A 03/18/2024   Pressure injury of skin 03/18/2024   Malnutrition of moderate degree 03/14/2024   Community acquired pneumonia 03/11/2024   Dyslipidemia 03/11/2024   Multifocal pneumonia 03/11/2024   Change in bowel habits 01/04/2024   Polyp of ascending colon 01/04/2024   Normocytic anemia 03/29/2023   Acute metabolic encephalopathy 03/29/2023   CAP (community acquired pneumonia) 03/28/2023   Sepsis (HCC) 03/28/2023   Paroxysmal atrial fibrillation (HCC) 03/28/2023   Depression 03/28/2023   Abnormal LFTs 03/28/2023   Total knee replacement status, 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   Blood clotting disorder (HCC) 04/07/2022   Primary osteoarthritis of left knee 03/07/2022   Acute hypoxic respiratory failure (HCC)    Pneumonia due to COVID-19 virus 12/28/2020   Testicular abscess 11/21/2020   Acute lower UTI 11/21/2020   Chronic systolic CHF (congestive heart failure) (HCC) 11/21/2020   AF (paroxysmal atrial fibrillation) (HCC)    Essential hypertension    Hypothyroidism    Acute on chronic combined systolic and diastolic CHF (congestive heart failure) (HCC) 05/16/2020   Acute on chronic systolic CHF (congestive heart  failure) (HCC) 05/16/2020   BPH (benign prostatic hyperplasia)    GERD (gastroesophageal reflux disease)    Hypertensive urgency    Bradycardia 05/08/2018   Situational depression 05/08/2018   CHF (congestive heart failure) (HCC) 11/13/2017   Atrial fibrillation with RVR (HCC) 11/12/2017   Acute respiratory distress syndrome (ARDS) (HCC) 11/12/2017   Elevated troponin 11/12/2017   Coronary artery disease 09/02/2015   Acute coronary syndrome (HCC) 09/01/2015   Unstable angina (HCC) 09/01/2015   Chest pain 08/16/2015    Palliative Care Assessment & Plan    Recommendations/Plan: PMT will follow.  Code Status:    Code Status Orders  (From admission, onward)           Start     Ordered   03/16/24 1248  Do not attempt resuscitation (DNR) Pre-Arrest Interventions Desired  (Code Status)  Continuous       Question Answer Comment  If pulseless and not breathing No CPR or chest compressions.   In Pre-Arrest Conditions (Patient Has Pulse and Is Breathing) May intubate, use advanced airway interventions and cardioversion/ACLS medications if appropriate or indicated. May transfer to ICU.   Consent: Discussion documented in EHR or advanced directives reviewed      03/16/24 1247           Code Status History     Date Active Date Inactive Code Status Order ID Comments User Context   03/10/2024 2223 03/16/2024 1247 Full Code [id number]  Hannah Beat, MD ED   03/28/2023 1324 04/03/2023 2018 Full Code [id number]  Lorretta Harp, MD ED   10/24/2022 1137 10/25/2022 2323 Full Code [id number]  Donato Heinz, MD Inpatient   12/28/2020 1950 01/13/2021 2155 Full Code [id number]  Mansy, Vernetta Honey, MD ED   11/21/2020 2009 11/27/2020 1945 Full Code [id number]  Hillary Bow, DO ED   05/16/2020 1227 05/18/2020 1503 Full Code [id number]  Lucile Shutters, MD ED  11/12/2017 1859 11/17/2017 2048 Full Code [id number]  Marguarite Arbour, MD Inpatient   09/01/2015 1337 09/02/2015 1353 Full Code [id number]  Alwyn Pea, MD Inpatient   09/01/2015 1331 09/01/2015 1337 Full Code [id number]  Laurier Nancy, MD Inpatient   09/01/2015 1254 09/01/2015 1331 Full Code [id number]  Laurier Nancy, MD Inpatient   08/16/2015 1631 08/17/2015 1627 Full Code [id number]  Houston Siren, MD Inpatient      Advance Directive Documentation    Flowsheet Row Most Recent Value  Type of Advance Directive Healthcare Power of Attorney  Pre-existing out of facility DNR order (yellow form or pink MOST form) --  "MOST" Form in Place? --      Thank you for allowing the Palliative Medicine Team to assist in the care of this patient.    Morton Stall, NP  Please contact Palliative Medicine Team phone at [phone] for questions and concerns.

## 2024-03-19 NOTE — Plan of Care (Signed)
  Problem: Clinical Measurements: Goal: Cardiovascular complication will be avoided Outcome: Progressing   Problem: Nutrition: Goal: Adequate nutrition will be maintained Outcome: Progressing   Problem: Education: Goal: Knowledge of General Education information will improve Description: Including pain rating scale, medication(s)/side effects and non-pharmacologic comfort measures Outcome: Not Progressing   Problem: Clinical Measurements: Goal: Ability to maintain clinical measurements within normal limits will improve Outcome: Not Progressing Goal: Will remain free from infection Outcome: Not Progressing Goal: Diagnostic test results will improve Outcome: Not Progressing Goal: Respiratory complications will improve Outcome: Not Progressing   Problem: Activity: Goal: Risk for activity intolerance will decrease Outcome: Not Progressing

## 2024-03-19 NOTE — Progress Notes (Signed)
   03/19/24 1425  Spiritual Encounters  Type of Visit Initial  Care provided to: Family (Wife and Son in hallway of ICU)  Referral source Family  Reason for visit Routine spiritual support  OnCall Visit No  Spiritual Framework  Presenting Themes Meaning/purpose/sources of inspiration;Goals in life/care;Values and beliefs;Other (comment) (for family)  Family Stress Factors Exhausted;Health changes;Other (Comment) (Both Wife and Son also had a virus at the same time as the patient and they are concerned about his recovery, but also his quality of life afterward.)  Interventions  Spiritual Care Interventions Made Established relationship of care and support;Compassionate presence;Reflective listening;Normalization of emotions;Decision-making support/facilitation;Encouragement;Prayer;Other (comment) (family requested prayer for the patient)  Intervention Outcomes  Outcomes Connection to spiritual care;Awareness of health;Awareness of support;Other (comment) (for family)   Micah Flesher to the ICU room with the family and stayed with them through the Dr. Visit and prayed for the patient.

## 2024-03-19 NOTE — Plan of Care (Signed)
  Problem: Coping: Goal: Level of anxiety will decrease Outcome: Progressing   Problem: Elimination: Goal: Will not experience complications related to urinary retention Outcome: Progressing   Problem: Pain Managment: Goal: General experience of comfort will improve and/or be controlled Outcome: Progressing   Problem: Clinical Measurements: Goal: Ability to maintain a body temperature in the normal range will improve Outcome: Progressing

## 2024-03-19 NOTE — Consult Note (Addendum)
 Pharmacy Antibiotic Note  Nathaniel Ibarra is a 83 y.o. male admitted on 03/10/2024 with  HCAP . Patient previously tested positive for Influenza A with associated PNA 03/03/24 and completed a course of Tamiflu, prednisone taper, and antibiotics. Currently, acute hypoxic respiratory failure with severe ARDS (P:F 84 yesterday, 105 this afternoon) secondary to influenza pneumonia and likely superimposed bacterial infection. Pharmacy has been consulted for Zosyn dosing.  Plan: -- Zosyn 3.375g IV q8h (4 hour infusion). -- Scr at baseline 1.03 -- Continue to follow for LOT  Height: 6' (182.9 cm) Weight: 87.1 kg (192 lb 0.3 oz) IBW/kg (Calculated) : 77.6  Temp (24hrs), Avg:98.4 F (36.9 C), Min:98 F (36.7 C), Max:98.9 F (37.2 C)  Recent Labs  Lab 03/15/24 0212 03/15/24 1411 03/16/24 0622 03/16/24 2203 03/17/24 0311 03/18/24 0305 03/19/24 0440  WBC 21.1*  --  26.9*  --  26.1* 33.1* 33.3*  CREATININE 0.83   < > 0.97 1.04 1.00 0.94 1.03   < > = values in this interval not displayed.    Estimated Creatinine Clearance: 60.7 mL/min (by C-G formula based on SCr of 1.03 mg/dL).    No Known Allergies  Antimicrobials this admission: 3/24 Zithromax >> 3/27 3/25 Ceftriaxone >> 3/27  Dose adjustments this admission: NA  Microbiology results: 3/26 RespCx: NGTD  3/25 Sputum: not acceptable for testing   3/25 MRSA PCR: Not detected  Thank you for allowing pharmacy to be a part of this patient's care.  Effie Shy, PharmD Pharmacy Resident  03/19/2024 6:43 PM

## 2024-03-19 NOTE — Progress Notes (Signed)
 NAME:  ZAKK BORGEN, MRN:  [id number], DOB:  [birth year], LOS: 9 ADMISSION DATE:  03/10/2024, CHIEF COMPLAINT:  Respiratory Failure, ARDS   History of Present Illness:   Nathaniel Ibarra is an 83 yo with a PMH significant for HTN, HLD, HFrEF, CM, CAD, Oxygen dependent 4 Liters (which he rarely uses), GERD, BPH, Depression, Hypothyroidism that presented to the ER with worsening dyspnea.  Patient previously tested positive for Influenza A with associated PNA 03/03/24 and completed a course of Tamiflu, prednisone taper, and antibiotics. Upon evaluation in the ER he was afebrile, 97% on 8 Liters, CXR low lung volumes with bronchitic changes and questionable developing right midlung zone and left mid to lower lung zone airspace opacities. He was started on antibiotics and HFNC, cultures sent. Condition worsened and a rapid response was called secondary to increased oxygen requirements and work of breath. Was placed on 100% bipap, lasix 40 mg x 1, Morphine, ABG, CXR, D-dimer sent. PCCM was consulted for medical management of patients Respiratory failure. Patient was intubated on 3/25 secondary to worsening respiratory status and worsening hypoxemia.  Pertinent  Medical History  HTN, HLD, HFrEF, CM, CAD, Oxygen dependent 4 Liters (which he rarely uses), GERD, BPH, Depression, Hypothyroidism  Significant Hospital Events: Including procedures, antibiotic start and stop dates in addition to other pertinent events   3/24: admit to [facility] for respiratory failure, HFNC to BiPAP, transfer to ICU 3/25: in ICU with worsening oxygen requirements, intubated 3/26: vented 3/28: palliative care consulted 3/31: vented, wife updated at the bedside, remains sedated 4/01: vented and sedated. Wife and son updated at the bedside.  Interim History / Subjective:  CT from yesterday with pneumomediastinum. No pneumothorax. Remains vented and sedated.  Objective   Blood pressure (!) 151/61, pulse 90, temperature 98 F  (36.7 C), temperature source Axillary, resp. rate (!) 22, height 6' (1.829 m), weight 87.1 kg, SpO2 90%.    Vent Mode: PRVC FiO2 (%):  [70 %-100 %] 70 % Set Rate:  [20 bmp-22 bmp] 22 bmp Vt Set:  [450 mL-500 mL] 450 mL PEEP:  [10 cmH20-12 cmH20] 10 cmH20 Plateau Pressure:  [20 cmH20-26 cmH20] 20 cmH20   Intake/Output Summary (Last 24 hours) at 03/19/2024 1815 Last data filed at 03/19/2024 1600 Gross per 24 hour  Intake 3227.85 ml  Output 2500 ml  Net 727.85 ml   Filed Weights   03/17/24 0315 03/18/24 0453 03/19/24 0346  Weight: 73.2 kg 68.4 kg 87.1 kg    Examination: Physical Exam Constitutional:      General: He is not in acute distress.    Appearance: He is ill-appearing.  HENT:     Mouth/Throat:     Mouth: Mucous membranes are moist.     Comments: ETT in place Cardiovascular:     Rate and Rhythm: Regular rhythm. Tachycardia present.     Pulses: Normal pulses.     Heart sounds: Normal heart sounds.  Pulmonary:     Breath sounds: Rhonchi present. No wheezing.  Skin:    Comments: Diffuse subcutaneous emphysema overlying the chest wall and extending to the abdominal wall  Neurological:     Mental Status: He is disoriented.      Assessment & Plan:   83 year old male with recent influenza infection presenting with worsening hypoxic respiratory failure requiring intubation and mechanical ventilation.  #Acute Hypoxic Respiratory Failure #Influenza A Pneumonia #Acute Respiratory Distress Syndrome #Superimposed Bacterial Pneumonia #Subcutaneous Emphysema #Atrial Fibrillation #Toxic Metabolic Encephalopathy #HFpEF #Hypothyroidism  Neuro: Toxic metabolic  encephalopathy secondary to severe pneumonia as well as sedation needs in the setting of severe ARDS. Currently on propofol and fentanyl for analgosedation. Goal RASS -3. CV: Has a history of HFpEF, with recent echocardiogram showing elevated PASP which could be secondary to increased PVR or heart failure. Maintained on  40 mg of IV furosemide daily with overall goal net even which he is maintaining. Also on amiodarone which he takes outpatient for atrial fibrillation. Pulm: Acute hypoxic respiratory failure with severe ARDS (P:F 84 yesterday, 105 this afternoon) secondary to influenza pneumonia and likely superimposed bacterial infection. Chest CT yesterday showed bilateral lower lobe consolidations, GGO's, and pneumomediastinum but no pneumothorax. Optimized PEEP and FiO2 to ARDS table, and will initiate prone position placement today. Tv decreased to 450.   ARDS management and interventions: -Prone: initiate today -Pee: 40 IV lasix daily -PEEP: 10 cmH2O, on PEEP table -Paralysis: not initiated -Pulmonary vasodilators: not initiated  -Perfusion & ECMO: not a candidate given age -Permissive hypercapnia: allowing -Low tidal volume ventilation: 450 mL / 6 cc/kg of IBW -Steroids: on methylpred  GI: On tube feeds, PPI for SUP Renal: kidney function appears to be within normal though his BUN is significantly elevated. Continue with gentle diuresis and attempt to avoid nephrotoxins Endo: On levothyroxine for hypothyroidism. On ICU glycemic protocol. On methylprednisolone for ARDS which we'll continue. Hem/Onc: Enoxaparin subQ for DVT prophylaxis. Does not appear to be anticoagulated outpatient. ID: Influenza A infection with possible superimposed bacterial infection resulting in severe pneumonia. He has finished a course of antibiotics on admission, with cultures not isolating any organisms. Will restart a course of broad spectrum antibiotics given lower lobe consolidations. Other - family (wife and son) updated at the bedside today.  Best Practice (right click and "Reselect all SmartList Selections" daily)   Diet/type: tubefeeds DVT prophylaxis LMWH Pressure ulcer(s): N/A GI prophylaxis: PPI Lines: N/A Foley:  Yes, and it is still needed Code Status:  DNR Last date of multidisciplinary goals of care  discussion [03/19/2024]  Labs   CBC: Recent Labs  Lab 03/15/24 0212 03/16/24 0622 03/17/24 0311 03/18/24 0305 03/19/24 0440  WBC 21.1* 26.9* 26.1* 33.1* 33.3*  HGB 12.7* 13.6 13.2 13.8 13.3  HCT 38.6* 42.2 42.4 44.1 42.1  MCV 97.0 98.8 100.5* 102.1* 101.0*  PLT 307 453* 415* 432* 411*    Basic Metabolic Panel: Recent Labs  Lab 03/14/24 0548 03/15/24 0212 03/15/24 1411 03/16/24 0622 03/16/24 2203 03/17/24 0311 03/18/24 0305 03/19/24 0440  NA 138 139   < > 138 139 138 141 144  K 3.7 4.5   < > 5.2* 5.4* 5.2* 5.5* 5.4*  CL 99 99   < > 96* 96* 97* 97* 98  CO2 31 34*   < > 35* 37* 35* 35* 38*  GLUCOSE 179* 221*   < > 203* 249* 241* 209* 210*  BUN 37* 55*   < > 93* 129* 104* 112* 126*  CREATININE 0.86 0.83   < > 0.97 1.04 1.00 0.94 1.03  CALCIUM 8.3* 8.0*   < > 8.3* 8.1* 7.9* 8.2* 8.3*  MG 2.6* 3.1*  --  3.4*  --  3.3* 3.8*  --   PHOS 2.6 2.0*  --  2.3*  --  2.5 5.2* 4.8*   < > = values in this interval not displayed.   GFR: Estimated Creatinine Clearance: 60.7 mL/min (by C-G formula based on SCr of 1.03 mg/dL). Recent Labs  Lab 03/16/24 0622 03/17/24 4132 03/18/24 0305 03/18/24 1728 03/19/24 0440  PROCALCITON  --   --   --  0.11  --   WBC 26.9* 26.1* 33.1*  --  33.3*    Liver Function Tests: Recent Labs  Lab 03/19/24 0440  ALBUMIN 2.3*   No results for input(s): "LIPASE", "AMYLASE" in the last 168 hours. No results for input(s): "AMMONIA" in the last 168 hours.  ABG    Component Value Date/Time   PHART 7.33 (L) 03/19/2024 1758   PCO2ART 92 (HH) 03/19/2024 1758   PO2ART 74 (L) 03/19/2024 1758   HCO3 48.5 (H) 03/19/2024 1758   TCO2 26 09/07/2007 1236   O2SAT 93.8 03/19/2024 1758     Coagulation Profile: No results for input(s): "INR", "PROTIME" in the last 168 hours.  Cardiac Enzymes: No results for input(s): "CKTOTAL", "CKMB", "CKMBINDEX", "TROPONINI" in the last 168 hours.  HbA1C: Hgb A1c MFr Bld  Date/Time Value Ref Range Status   07/26/2023 09:57 AM 6.1 (H) 4.8 - 5.6 % Final    Comment:             Prediabetes: 5.7 - 6.4          Diabetes: >6.4          Glycemic control for adults with diabetes: <7.0   12/30/2020 04:17 AM 5.5 4.8 - 5.6 % Final    Comment:    (NOTE) Pre diabetes:          5.7%-6.4%  Diabetes:              >6.4%  Glycemic control for   <7.0% adults with diabetes     CBG: Recent Labs  Lab 03/18/24 2324 03/19/24 0314 03/19/24 0749 03/19/24 1148 03/19/24 1620  GLUCAP 201* 229* 198* 215* 183*    Review of Systems:   Unable to obtain  Past Medical History:  He,  has a past medical history of Aortic atherosclerosis (HCC), Basal cell carcinoma (05/18/2022), Bladder cancer (HCC) (2008), BPH (benign prostatic hyperplasia), Bradycardia, Cardiomyopathy (HCC), Cataract, bilateral, Coronary artery disease, Depression, GERD (gastroesophageal reflux disease), HFrEF (heart failure with reduced ejection fraction) (HCC), History of 2019 novel coronavirus disease (COVID-19), HLD (hyperlipidemia), Hypertension, Hypothyroidism, LBBB (left bundle branch block), Long term current use of amiodarone, NSTEMI (non-ST elevated myocardial infarction) (HCC) (2016), Osteoarthritis, Paroxysmal atrial fibrillation (HCC), Peripheral edema, Pneumonia due to COVID-19 virus (12/2020), Sleep difficulties, 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   Surgical History:   Past Surgical History:  Procedure Laterality Date   BIOPSY  01/04/2024   Procedure: BIOPSY;  Surgeon: Midge Minium, MD;  Location: [facility] ENDOSCOPY;  Service: Endoscopy;;   CARDIAC CATHETERIZATION N/A 09/01/2015   Procedure: Left Heart Cath and Coronary Angiography;  Surgeon: Laurier Nancy, MD;  Location: [facility] INVASIVE CV LAB;  Service: Cardiovascular;  Laterality: N/A;   CARDIAC CATHETERIZATION N/A 09/01/2015   Procedure: Coronary Stent Intervention;  Surgeon: Alwyn Pea, MD;  Location: [facility]  INVASIVE CV LAB;  Service: Cardiovascular;  Laterality: N/A;   CATARACT EXTRACTION W/PHACO Left 11/23/2016   Procedure: CATARACT EXTRACTION PHACO AND INTRAOCULAR LENS PLACEMENT (IOC);  Surgeon: Lockie Mola, MD;  Location: [facility];  Service: Ophthalmology;  Laterality: Left;   CATARACT EXTRACTION W/PHACO Right 02/01/2017   Procedure: CATARACT EXTRACTION PHACO AND INTRAOCULAR LENS PLACEMENT (IOC) Complicated  right toric lens;  Surgeon: Lockie Mola, MD;  Location: [facility];  Service: Ophthalmology;  Laterality: Right;  Malyugin toric Lens   COLONOSCOPY WITH PROPOFOL N/A 01/04/2024   Procedure: COLONOSCOPY WITH  PROPOFOL;  Surgeon: Midge Minium, MD;  Location: [facility] ENDOSCOPY;  Service: Endoscopy;  Laterality: N/A;   KNEE ARTHROPLASTY Left 10/24/2022   Procedure: COMPUTER ASSISTED TOTAL KNEE ARTHROPLASTY;  Surgeon: Donato Heinz, MD;  Location: [facility] ORS;  Service: Orthopedics;  Laterality: Left;   LEFT HEART CATH AND CORONARY ANGIOGRAPHY Right 11/14/2017   Procedure: LEFT HEART CATH AND CORONARY ANGIOGRAPHY;  Surgeon: Laurier Nancy, MD;  Location: [facility] INVASIVE CV LAB;  Service: Cardiovascular;  Laterality: Right;   POLYPECTOMY  01/04/2024   Procedure: POLYPECTOMY;  Surgeon: Midge Minium, MD;  Location: [facility] ENDOSCOPY;  Service: Endoscopy;;   SCROTAL EXPLORATION Left 11/24/2020   Procedure: SCROTUM EXPLORATION WITH LEFT ORCHIECTOMY;  Surgeon: Noel Christmas, MD;  Location: [facility] ORS;  Service: Urology;  Laterality: Left;   TONSILLECTOMY       Social History:   reports that he has never smoked. He has never used smokeless tobacco. He reports that he does not drink alcohol and does not use drugs.   Family History:  His family history includes Emphysema in his father and mother.   Allergies No Known Allergies   Home Medications  Prior to Admission medications   Medication Sig Start Date End Date Taking? Authorizing Provider  acetaminophen (TYLENOL) 325  MG tablet Take 2 tablets (650 mg total) by mouth every 6 (six) hours as needed for mild pain or headache (fever >/= 101). 01/13/21  Yes Lonia Blood, MD  albuterol (VENTOLIN HFA) 108 (90 Base) MCG/ACT inhaler Inhale 1 puff into the lungs every 4 (four) hours as needed. 10/27/21  Yes [provider]  amiodarone (PACERONE) 200 MG tablet Take 1 tablet (200 mg total) by mouth daily. 05/18/20  Yes Wieting, Richard, MD  benzonatate (TESSALON) 200 MG capsule Take 200 mg by mouth 3 (three) times daily as needed for cough. 03/03/24  Yes [provider]  famotidine (PEPCID) 20 MG tablet Take 1 tablet (20 mg total) by mouth 2 (two) times daily for 15 days. 05/07/23 03/11/24 Yes Sharman Cheek, MD  finasteride (PROSCAR) 5 MG tablet Take 1 tablet (5 mg total) by mouth daily. 05/18/20  Yes Wieting, Richard, MD  ipratropium (ATROVENT) 0.06 % nasal spray Place 2 sprays into the nose 3 (three) times daily as needed. 03/26/23 03/25/24 Yes [provider]  levothyroxine (SYNTHROID) 100 MCG tablet TAKE 1 TABLET BY MOUTH EVERY DAY IN THE MORNING 02/16/24  Yes Tejan-Sie, Marcelino Freestone, MD  PARoxetine (PAXIL) 20 MG tablet TAKE 1 TABLET BY MOUTH EVERY DAY 12/25/23  Yes Tejan-Sie, Marcelino Freestone, MD  rosuvastatin (CRESTOR) 10 MG tablet Take 1 tablet (10 mg total) by mouth daily. 07/28/23 07/27/24 Yes Sherron Monday, MD  spironolactone (ALDACTONE) 25 MG tablet Take 0.5 tablets (12.5 mg total) by mouth daily. 05/19/20  Yes Wieting, Richard, MD  tamsulosin (FLOMAX) 0.4 MG CAPS capsule Take 0.8 mg by mouth at bedtime.    Yes [provider]  cariprazine (VRAYLAR) 1.5 MG capsule Take 1 capsule (1.5 mg total) by mouth daily. Patient not taking: Reported on 03/11/2024 12/11/23 03/10/24  Sherron Monday, MD  cariprazine (VRAYLAR) 1.5 MG capsule Take 1 capsule (1.5 mg total) by mouth daily for 14 days. Patient not taking: Reported on 03/11/2024 12/11/23 12/25/23  Sherron Monday, MD  naproxen (NAPROSYN) 500 MG tablet  Take 500 mg by mouth 2 (two) times daily as needed for mild pain. Patient not taking: Reported on 09/25/2023 02/22/23   [provider]  ondansetron (ZOFRAN-ODT) 4 MG disintegrating  tablet Take 1 tablet (4 mg total) by mouth every 8 (eight) hours as needed for nausea or vomiting. Patient not taking: Reported on 09/25/2023 05/07/23   Sharman Cheek, MD  promethazine-dextromethorphan (PROMETHAZINE-DM) 6.25-15 MG/5ML syrup Take 5 mLs by mouth every 6 (six) hours as needed for cough. Patient not taking: Reported on 09/25/2023 03/26/23   [provider]     Critical care time: 48 minutes     Raechel Chute, MD [facility] 03/19/2024 6:20 PM

## 2024-03-19 NOTE — Inpatient Diabetes Management (Signed)
 Inpatient Diabetes Program Recommendations  AACE/ADA: New Consensus Statement on Inpatient Glycemic Control (2015)  Target Ranges:  Prepandial:   less than 140 mg/dL      Peak postprandial:   less than 180 mg/dL (1-2 hours)      Critically ill patients:  140 - 180 mg/dL    Latest Reference Range & Units 03/19/24 03:14 03/19/24 07:49 03/19/24 11:48  Glucose-Capillary 70 - 99 mg/dL 409 (H) 811 (H) 914 (H)    Latest Reference Range & Units 03/18/24 03:37 03/18/24 08:11 03/18/24 11:39 03/18/24 15:56 03/18/24 19:41 03/18/24 23:24  Glucose-Capillary 70 - 99 mg/dL 782 (H) 956 (H) 213 (H) 222 (H) 168 (H) 201 (H)   Review of Glycemic Control  Current orders for Inpatient glycemic control: Novolog 0-20 units Q4H, Novolog 2 units Q4H; Solumedrol 40 mg Q12H; Vital @ 60 ml/hr   Inpatient Diabetes Program Recommendations:     Insulin: May want to consider increasing tube feeding coverage to Novolog 4 units Q4H.   Thanks, Orlando Penner, RN, MSN, CDCES Diabetes Coordinator Inpatient Diabetes Program [phone] (Team Pager from 8am to 5pm)

## 2024-03-19 DEATH — deceased

## 2024-03-20 ENCOUNTER — Inpatient Hospital Stay

## 2024-03-20 DIAGNOSIS — J9601 Acute respiratory failure with hypoxia: Secondary | ICD-10-CM | POA: Diagnosis not present

## 2024-03-20 DIAGNOSIS — Z7189 Other specified counseling: Secondary | ICD-10-CM | POA: Diagnosis not present

## 2024-03-20 LAB — RENAL FUNCTION PANEL
Albumin: 2.1 g/dL — ABNORMAL LOW (ref 3.5–5.0)
Anion gap: 11 (ref 5–15)
BUN: 135 mg/dL — ABNORMAL HIGH (ref 8–23)
CO2: 38 mmol/L — ABNORMAL HIGH (ref 22–32)
Calcium: 8.1 mg/dL — ABNORMAL LOW (ref 8.9–10.3)
Chloride: 97 mmol/L — ABNORMAL LOW (ref 98–111)
Creatinine, Ser: 1.08 mg/dL (ref 0.61–1.24)
GFR, Estimated: >60 mL/min (ref >60)
Glucose, Bld: 281 mg/dL — ABNORMAL HIGH (ref 70–99)
Phosphorus: 4.3 mg/dL (ref 2.5–4.6)
Potassium: 4.8 mmol/L (ref 3.5–5.1)
Sodium: 146 mmol/L — ABNORMAL HIGH (ref 135–145)

## 2024-03-20 LAB — BLOOD GAS, ARTERIAL
Acid-Base Excess: 20.3 mmol/L — ABNORMAL HIGH (ref 0.0–2.0)
Bicarbonate: 49.3 mmol/L — ABNORMAL HIGH (ref 20.0–28.0)
FIO2: 65 %
MECHVT: 500 mL
Mechanical Rate: 24
O2 Saturation: 97.7 %
PEEP: 10 cmH2O
Patient temperature: 37
pCO2 arterial: 76 mmHg (ref 32–48)
pH, Arterial: 7.42 (ref 7.35–7.45)
pO2, Arterial: 99 mmHg (ref 83–108)

## 2024-03-20 LAB — CBC
HCT: 40.6 % (ref 39.0–52.0)
Hemoglobin: 12.6 g/dL — ABNORMAL LOW (ref 13.0–17.0)
MCH: 31.9 pg (ref 26.0–34.0)
MCHC: 31 g/dL (ref 30.0–36.0)
MCV: 102.8 fL — ABNORMAL HIGH (ref 80.0–100.0)
Platelets: 342 10*3/uL (ref 150–400)
RBC: 3.95 MIL/uL — ABNORMAL LOW (ref 4.22–5.81)
RDW: 13.7 % (ref 11.5–15.5)
WBC: 28.7 10*3/uL — ABNORMAL HIGH (ref 4.0–10.5)
nRBC: 0 % (ref 0.0–0.2)

## 2024-03-20 LAB — GLUCOSE, CAPILLARY
Glucose-Capillary: 251 mg/dL — ABNORMAL HIGH (ref 70–99)
Glucose-Capillary: 189 mg/dL — ABNORMAL HIGH (ref 70–99)
Glucose-Capillary: 187 mg/dL — ABNORMAL HIGH (ref 70–99)
Glucose-Capillary: 212 mg/dL — ABNORMAL HIGH (ref 70–99)
Glucose-Capillary: 199 mg/dL — ABNORMAL HIGH (ref 70–99)
Glucose-Capillary: 245 mg/dL — ABNORMAL HIGH (ref 70–99)

## 2024-03-20 LAB — MAGNESIUM: Magnesium: 3.7 mg/dL — ABNORMAL HIGH (ref 1.7–2.4)

## 2024-03-20 MED ORDER — FREE WATER
200.0000 mL | Status: DC
  Administered 2024-03-20 – 2024-03-24 (×24): 200 mL

## 2024-03-20 MED ORDER — ADULT MULTIVITAMIN W/MINERALS CH
1.0000 | ORAL_TABLET | Freq: Every day | ORAL | Status: DC
  Administered 2024-03-21 – 2024-03-23 (×3): 1
  Filled 2024-03-20 (×3): qty 1

## 2024-03-20 MED ORDER — INSULIN ASPART 100 UNIT/ML IJ SOLN
4.0000 [IU] | INTRAMUSCULAR | Status: DC
  Administered 2024-03-20 – 2024-03-24 (×22): 4 [IU] via SUBCUTANEOUS
  Filled 2024-03-20 (×22): qty 1

## 2024-03-20 MED ORDER — BISACODYL 10 MG RE SUPP: 10.0000 mg | Freq: Every day | RECTAL | Status: DC | PRN

## 2024-03-20 MED ORDER — VITAL HIGH PROTEIN PO LIQD
1000.0000 mL | ORAL | Status: DC
  Administered 2024-03-20 – 2024-03-21 (×2): 1000 mL

## 2024-03-20 NOTE — Inpatient Diabetes Management (Addendum)
 Inpatient Diabetes Program Recommendations  AACE/ADA: New Consensus Statement on Inpatient Glycemic Control (2015)  Target Ranges:  Prepandial:   less than 140 mg/dL      Peak postprandial:   less than 180 mg/dL (1-2 hours)      Critically ill patients:  140 - 180 mg/dL     Latest Reference Range & Units 03/19/24 23:44 03/20/24 03:45 03/20/24 07:39  Glucose-Capillary 70 - 99 mg/dL 295 (H)  13 units Novolog  251 (H)  13 units Novolog  189 (H)  6 units Novolog   (H): Data is abnormally high     Current Orders: Novolog Resistant Correction Scale/ SSI (0-20 units) Q4 hours     Novolog 2 units Q4 hours    Getting Solumedrol 40 mg BID Tube feeds running 60cc/hr    MD- Please consider increasing the Novolog Tube Feed Coverage to 4 units Q4 hours    --Will follow patient during hospitalization--  Ambrose Finland RN, MSN, CDCES Diabetes Coordinator Inpatient Glycemic Control Team Team Pager: [phone] (8a-5p)

## 2024-03-20 NOTE — Plan of Care (Signed)
  Problem: Nutrition: Goal: Adequate nutrition will be maintained Outcome: Progressing   Problem: Coping: Goal: Level of anxiety will decrease Outcome: Progressing   Problem: Elimination: Goal: Will not experience complications related to urinary retention Outcome: Progressing   Problem: Pain Managment: Goal: General experience of comfort will improve and/or be controlled Outcome: Progressing

## 2024-03-20 NOTE — Progress Notes (Signed)
 NAME:  Nathaniel Ibarra, MRN:  [id number], DOB:  1941-04-11, LOS: 10 ADMISSION DATE:  03/10/2024, CHIEF COMPLAINT:  Respiratory Failure, ARDS   History of Present Illness:   Nathaniel Ibarra is an 83 yo with a PMH significant for HTN, HLD, HFrEF, CM, CAD, Oxygen dependent 4 Liters (which he rarely uses), GERD, BPH, Depression, Hypothyroidism that presented to the ER with worsening dyspnea.  Patient previously tested positive for Influenza A with associated PNA 03/03/24 and completed a course of Tamiflu, prednisone taper, and antibiotics. Upon evaluation in the ER he was afebrile, 97% on 8 Liters, CXR low lung volumes with bronchitic changes and questionable developing right midlung zone and left mid to lower lung zone airspace opacities. He was started on antibiotics and HFNC, cultures sent. Condition worsened and a rapid response was called secondary to increased oxygen requirements and work of breath. Was placed on 100% bipap, lasix 40 mg x 1, Morphine, ABG, CXR, D-dimer sent. PCCM was consulted for medical management of patients Respiratory failure. Patient was intubated on 3/25 secondary to worsening respiratory status and worsening hypoxemia.  Pertinent  Medical History  HTN, HLD, HFrEF, CM, CAD, Oxygen dependent 4 Liters (which he rarely uses), GERD, BPH, Depression, Hypothyroidism  Significant Hospital Events: Including procedures, antibiotic start and stop dates in addition to other pertinent events   3/24: admit to [facility] for respiratory failure, HFNC to BiPAP, transfer to ICU 3/25: in ICU with worsening oxygen requirements, intubated 3/26: vented 3/28: palliative care consulted 3/31: vented, wife updated at the bedside, remains sedated 4/01: vented and sedated. Wife and son updated at the bedside. Placed in prone position overnight 4/02: back supine in AM, worsening oxygenation, family updated at the bedside.  Interim History / Subjective:  Remains unresponsive and intubated. Family at  bedside.  Objective   Blood pressure (!) 118/50, pulse 82, temperature 100 F (37.8 C), resp. rate (!) 24, height 6' (1.829 m), weight 88.8 kg, SpO2 90%.    Vent Mode: PRVC FiO2 (%):  [65 %-100 %] 100 % Set Rate:  [22 bmp-24 bmp] 24 bmp Vt Set:  [450 mL-500 mL] 500 mL PEEP:  [10 cmH20-12 cmH20] 10 cmH20 Plateau Pressure:  [20 cmH20] 20 cmH20   Intake/Output Summary (Last 24 hours) at 03/20/2024 1303 Last data filed at 03/20/2024 1100 Gross per 24 hour  Intake 2980.65 ml  Output 2825 ml  Net 155.65 ml   Filed Weights   03/18/24 0453 03/19/24 0346 03/20/24 0500  Weight: 68.4 kg 87.1 kg 88.8 kg    Examination: Physical Exam Constitutional:      General: He is not in acute distress.    Appearance: He is ill-appearing.  HENT:     Mouth/Throat:     Mouth: Mucous membranes are moist.     Comments: ETT in place Cardiovascular:     Rate and Rhythm: Regular rhythm. Tachycardia present.     Pulses: Normal pulses.     Heart sounds: Normal heart sounds.  Pulmonary:     Breath sounds: Rhonchi present. No wheezing.  Skin:    Comments: Diffuse subcutaneous emphysema overlying the chest wall and extending to the abdominal wall  Neurological:     Mental Status: He is disoriented.      Assessment & Plan:   83 year old male with recent influenza infection presenting with worsening hypoxic respiratory failure requiring intubation and mechanical ventilation.  #Acute Hypoxic Respiratory Failure #Influenza A Pneumonia #Acute Respiratory Distress Syndrome #Superimposed Bacterial Pneumonia #Subcutaneous Emphysema #Atrial Fibrillation #  Toxic Metabolic Encephalopathy #HFpEF #Hypothyroidism  Neuro: Toxic metabolic encephalopathy secondary to severe pneumonia as well as sedation needs in the setting of severe ARDS. Currently on propofol and fentanyl for analgosedation. Goal RASS -3. CV: Has a history of HFpEF, with recent echocardiogram showing elevated PASP which could be secondary to  increased PVR or heart failure. Maintained on 40 mg of IV furosemide daily with overall goal net even which he is maintaining. Also on amiodarone which he takes outpatient for atrial fibrillation. Pulm: Acute hypoxic respiratory failure with severe ARDS (P:F 84 2 days ago, 105 yesterday, 116 in prone position overnight) secondary to influenza pneumonia and likely superimposed bacterial infection. Chest CT showed bilateral lower lobe consolidations, GGO's, and pneumomediastinum but no pneumothorax. Optimized PEEP and FiO2 to ARDS table, minimal improvement in prone position overnight.   ARDS management and interventions: -Prone: proned overnight, will consider today pending goals of care -Pee: 40 IV lasix daily -PEEP: 10 cmH2O, on PEEP table -Paralysis: not initiated -Pulmonary vasodilators: not initiated  -Perfusion & ECMO: not a candidate given age -Permissive hypercapnia: allowing -Low tidal volume ventilation: 450 mL / 6 cc/kg of IBW -Steroids: on methylpred  GI: On tube feeds, PPI for SUP Renal: kidney function appears to be within normal though his BUN is significantly elevated. Continue with gentle diuresis and attempt to avoid nephrotoxins Endo: On levothyroxine for hypothyroidism. On ICU glycemic protocol. On methylprednisolone for ARDS which we'll continue. Hem/Onc: Enoxaparin subQ for DVT prophylaxis. Does not appear to be anticoagulated outpatient. ID: Influenza A infection with possible superimposed bacterial infection resulting in severe pneumonia. He has finished a course of antibiotics on admission, with cultures not isolating any organisms. Restarted broad spectrum antibiotics given lower lobe consolidations. Cultures remained negative.  Best Practice (right click and "Reselect all SmartList Selections" daily)   Diet/type: tubefeeds DVT prophylaxis LMWH Pressure ulcer(s): N/A GI prophylaxis: PPI Lines: N/A Foley:  Yes, and it is still needed Code Status:  DNR Last date of  multidisciplinary goals of care discussion [03/20/2024]  Labs   CBC: Recent Labs  Lab 03/16/24 0622 03/17/24 0311 03/18/24 0305 03/19/24 0440 03/20/24 0332  WBC 26.9* 26.1* 33.1* 33.3* 28.7*  HGB 13.6 13.2 13.8 13.3 12.6*  HCT 42.2 42.4 44.1 42.1 40.6  MCV 98.8 100.5* 102.1* 101.0* 102.8*  PLT 453* 415* 432* 411* 342    Basic Metabolic Panel: Recent Labs  Lab 03/15/24 0212 03/15/24 1411 03/16/24 0622 03/16/24 2203 03/17/24 0311 03/18/24 0305 03/19/24 0440 03/20/24 0332  NA 139   < > 138 139 138 141 144 146*  K 4.5   < > 5.2* 5.4* 5.2* 5.5* 5.4* 4.8  CL 99   < > 96* 96* 97* 97* 98 97*  CO2 34*   < > 35* 37* 35* 35* 38* 38*  GLUCOSE 221*   < > 203* 249* 241* 209* 210* 281*  BUN 55*   < > 93* 129* 104* 112* 126* 135*  CREATININE 0.83   < > 0.97 1.04 1.00 0.94 1.03 1.08  CALCIUM 8.0*   < > 8.3* 8.1* 7.9* 8.2* 8.3* 8.1*  MG 3.1*  --  3.4*  --  3.3* 3.8*  --  3.7*  PHOS 2.0*  --  2.3*  --  2.5 5.2* 4.8* 4.3   < > = values in this interval not displayed.   GFR: Estimated Creatinine Clearance: 57.9 mL/min (by C-G formula based on SCr of 1.08 mg/dL). Recent Labs  Lab 03/17/24 [id number] 03/18/24 0305 03/18/24 1728 03/19/24  0440 03/20/24 0332  PROCALCITON  --   --  0.11  --   --   WBC 26.1* 33.1*  --  33.3* 28.7*    Liver Function Tests: Recent Labs  Lab 03/19/24 0440 03/20/24 0332  ALBUMIN 2.3* 2.1*   No results for input(s): "LIPASE", "AMYLASE" in the last 168 hours. No results for input(s): "AMMONIA" in the last 168 hours.  ABG    Component Value Date/Time   PHART 7.42 03/20/2024 0422   PCO2ART 76 (HH) 03/20/2024 0422   PO2ART 99 03/20/2024 0422   HCO3 49.3 (H) 03/20/2024 0422   TCO2 26 09/07/2007 1236   O2SAT 97.7 03/20/2024 0422     Coagulation Profile: No results for input(s): "INR", "PROTIME" in the last 168 hours.  Cardiac Enzymes: No results for input(s): "CKTOTAL", "CKMB", "CKMBINDEX", "TROPONINI" in the last 168 hours.  HbA1C: Hgb A1c MFr  Bld  Date/Time Value Ref Range Status  07/26/2023 09:57 AM 6.1 (H) 4.8 - 5.6 % Final    Comment:             Prediabetes: 5.7 - 6.4          Diabetes: >6.4          Glycemic control for adults with diabetes: <7.0   12/30/2020 04:17 AM 5.5 4.8 - 5.6 % Final    Comment:    (NOTE) Pre diabetes:          5.7%-6.4%  Diabetes:              >6.4%  Glycemic control for   <7.0% adults with diabetes     CBG: Recent Labs  Lab 03/19/24 2328 03/19/24 2344 03/20/24 0345 03/20/24 0739 03/20/24 1125  GLUCAP 314* 283* 251* 189* 187*    Review of Systems:   Unable to obtain  Past Medical History:  He,  has a past medical history of Aortic atherosclerosis (HCC), Basal cell carcinoma (05/18/2022), Bladder cancer (HCC) (2008), BPH (benign prostatic hyperplasia), Bradycardia, Cardiomyopathy (HCC), Cataract, bilateral, Coronary artery disease, Depression, GERD (gastroesophageal reflux disease), HFrEF (heart failure with reduced ejection fraction) (HCC), History of 2019 novel coronavirus disease (COVID-19), HLD (hyperlipidemia), Hypertension, Hypothyroidism, LBBB (left bundle branch block), Long term current use of amiodarone, NSTEMI (non-ST elevated myocardial infarction) (HCC) (2016), Osteoarthritis, Paroxysmal atrial fibrillation (HCC), Peripheral edema, Pneumonia due to COVID-19 virus (12/2020), Sleep difficulties, 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   Surgical History:   Past Surgical History:  Procedure Laterality Date   BIOPSY  01/04/2024   Procedure: BIOPSY;  Surgeon: Midge Minium, MD;  Location: [facility] ENDOSCOPY;  Service: Endoscopy;;   CARDIAC CATHETERIZATION N/A 09/01/2015   Procedure: Left Heart Cath and Coronary Angiography;  Surgeon: Laurier Nancy, MD;  Location: [facility] INVASIVE CV LAB;  Service: Cardiovascular;  Laterality: N/A;   CARDIAC CATHETERIZATION N/A 09/01/2015   Procedure: Coronary Stent Intervention;  Surgeon:  Alwyn Pea, MD;  Location: [facility] INVASIVE CV LAB;  Service: Cardiovascular;  Laterality: N/A;   CATARACT EXTRACTION W/PHACO Left 11/23/2016   Procedure: CATARACT EXTRACTION PHACO AND INTRAOCULAR LENS PLACEMENT (IOC);  Surgeon: Lockie Mola, MD;  Location: [facility];  Service: Ophthalmology;  Laterality: Left;   CATARACT EXTRACTION W/PHACO Right 02/01/2017   Procedure: CATARACT EXTRACTION PHACO AND INTRAOCULAR LENS PLACEMENT (IOC) Complicated  right toric lens;  Surgeon: Lockie Mola, MD;  Location: [facility];  Service: Ophthalmology;  Laterality: Right;  Malyugin toric Lens   COLONOSCOPY WITH PROPOFOL N/A 01/04/2024  Procedure: COLONOSCOPY WITH PROPOFOL;  Surgeon: Midge Minium, MD;  Location: [facility] ENDOSCOPY;  Service: Endoscopy;  Laterality: N/A;   KNEE ARTHROPLASTY Left 10/24/2022   Procedure: COMPUTER ASSISTED TOTAL KNEE ARTHROPLASTY;  Surgeon: Donato Heinz, MD;  Location: [facility] ORS;  Service: Orthopedics;  Laterality: Left;   LEFT HEART CATH AND CORONARY ANGIOGRAPHY Right 11/14/2017   Procedure: LEFT HEART CATH AND CORONARY ANGIOGRAPHY;  Surgeon: Laurier Nancy, MD;  Location: [facility] INVASIVE CV LAB;  Service: Cardiovascular;  Laterality: Right;   POLYPECTOMY  01/04/2024   Procedure: POLYPECTOMY;  Surgeon: Midge Minium, MD;  Location: [facility] ENDOSCOPY;  Service: Endoscopy;;   SCROTAL EXPLORATION Left 11/24/2020   Procedure: SCROTUM EXPLORATION WITH LEFT ORCHIECTOMY;  Surgeon: Noel Christmas, MD;  Location: [facility] ORS;  Service: Urology;  Laterality: Left;   TONSILLECTOMY       Social History:   reports that he has never smoked. He has never used smokeless tobacco. He reports that he does not drink alcohol and does not use drugs.   Family History:  His family history includes Emphysema in his father and mother.   Allergies No Known Allergies   Home Medications  Prior to Admission medications   Medication Sig Start Date End Date Taking? Authorizing  Provider  acetaminophen (TYLENOL) 325 MG tablet Take 2 tablets (650 mg total) by mouth every 6 (six) hours as needed for mild pain or headache (fever >/= 101). 01/13/21  Yes Lonia Blood, MD  albuterol (VENTOLIN HFA) 108 (90 Base) MCG/ACT inhaler Inhale 1 puff into the lungs every 4 (four) hours as needed. 10/27/21  Yes [provider]  amiodarone (PACERONE) 200 MG tablet Take 1 tablet (200 mg total) by mouth daily. 05/18/20  Yes Wieting, Richard, MD  benzonatate (TESSALON) 200 MG capsule Take 200 mg by mouth 3 (three) times daily as needed for cough. 03/03/24  Yes [provider]  famotidine (PEPCID) 20 MG tablet Take 1 tablet (20 mg total) by mouth 2 (two) times daily for 15 days. 05/07/23 03/11/24 Yes Sharman Cheek, MD  finasteride (PROSCAR) 5 MG tablet Take 1 tablet (5 mg total) by mouth daily. 05/18/20  Yes Wieting, Richard, MD  ipratropium (ATROVENT) 0.06 % nasal spray Place 2 sprays into the nose 3 (three) times daily as needed. 03/26/23 03/25/24 Yes [provider]  levothyroxine (SYNTHROID) 100 MCG tablet TAKE 1 TABLET BY MOUTH EVERY DAY IN THE MORNING 02/16/24  Yes Tejan-Sie, Marcelino Freestone, MD  PARoxetine (PAXIL) 20 MG tablet TAKE 1 TABLET BY MOUTH EVERY DAY 12/25/23  Yes Tejan-Sie, Marcelino Freestone, MD  rosuvastatin (CRESTOR) 10 MG tablet Take 1 tablet (10 mg total) by mouth daily. 07/28/23 07/27/24 Yes Sherron Monday, MD  spironolactone (ALDACTONE) 25 MG tablet Take 0.5 tablets (12.5 mg total) by mouth daily. 05/19/20  Yes Wieting, Richard, MD  tamsulosin (FLOMAX) 0.4 MG CAPS capsule Take 0.8 mg by mouth at bedtime.    Yes [provider]  cariprazine (VRAYLAR) 1.5 MG capsule Take 1 capsule (1.5 mg total) by mouth daily. Patient not taking: Reported on 03/11/2024 12/11/23 03/10/24  Sherron Monday, MD  cariprazine (VRAYLAR) 1.5 MG capsule Take 1 capsule (1.5 mg total) by mouth daily for 14 days. Patient not taking: Reported on 03/11/2024 12/11/23 12/25/23  Sherron Monday,  MD  naproxen (NAPROSYN) 500 MG tablet Take 500 mg by mouth 2 (two) times daily as needed for mild pain. Patient not taking: Reported on 09/25/2023 02/22/23   [provider]  ondansetron (ZOFRAN-ODT)  4 MG disintegrating tablet Take 1 tablet (4 mg total) by mouth every 8 (eight) hours as needed for nausea or vomiting. Patient not taking: Reported on 09/25/2023 05/07/23   Sharman Cheek, MD  promethazine-dextromethorphan (PROMETHAZINE-DM) 6.25-15 MG/5ML syrup Take 5 mLs by mouth every 6 (six) hours as needed for cough. Patient not taking: Reported on 09/25/2023 03/26/23   [provider]     Critical care time: 55 minutes     Raechel Chute, MD [facility] 03/20/2024 1:09 PM

## 2024-03-20 NOTE — Plan of Care (Signed)
  Problem: Coping: Goal: Level of anxiety will decrease Outcome: Progressing   Problem: Elimination: Goal: Will not experience complications related to urinary retention Outcome: Progressing   Problem: Pain Managment: Goal: General experience of comfort will improve and/or be controlled Outcome: Progressing   Problem: Safety: Goal: Ability to remain free from injury will improve Outcome: Progressing   Problem: Education: Goal: Knowledge of General Education information will improve Description: Including pain rating scale, medication(s)/side effects and non-pharmacologic comfort measures Outcome: Not Progressing   Problem: Health Behavior/Discharge Planning: Goal: Ability to manage health-related needs will improve Outcome: Not Progressing   Problem: Clinical Measurements: Goal: Respiratory complications will improve Outcome: Not Progressing   Problem: Activity: Goal: Risk for activity intolerance will decrease Outcome: Not Progressing   Problem: Elimination: Goal: Will not experience complications related to bowel motility Outcome: Not Progressing   Problem: Skin Integrity: Goal: Risk for impaired skin integrity will decrease Outcome: Not Progressing   Problem: Role Relationship: Goal: Method of communication will improve Outcome: Not Progressing

## 2024-03-20 NOTE — Progress Notes (Signed)
 Nutrition Follow Up Note   DOCUMENTATION CODES:   Non-severe (moderate) malnutrition in context of chronic illness  INTERVENTION:   Change to Vital HP@55ml /hr continuous   Free water flushes q4 hours per pharmacy   Propofol: 26.4 ml/hr- provides 697kcal/day   Regimen provides 2017kcal/day, 116g/day protein and 2326ml/day of free water.   Juven Fruit Punch BID via tube, each serving provides 95kcal and 2.5g of protein (amino acids glutamine and arginine)  Daily weights   NUTRITION DIAGNOSIS:   Moderate Malnutrition related to chronic illness as evidenced by mild fat depletion, moderate fat depletion, mild muscle depletion, severe muscle depletion. -ongoing   GOAL:   Provide needs based on ASPEN/SCCM guidelines -met   MONITOR:   Vent status, Labs, Weight trends, TF tolerance, I & O's, Skin  ASSESSMENT:   83 y/o male with h/o BPH, hypothyroidism, PAF, depression, HLD, CAD, CHF, GERD, HTN, bladder cancer and recent flu A who is admitted with CAP and ARDS.  Pt remains sedated and ventilated. Pt proned overnight. OGT in place. Pt is tolerating tube feeds well at goal rate. Pt with ongoing hypercapnia and worsening renal function; will adjust tube feeds. Refeed labs stable. Pt with mild hypernatremia today; free water increased by pharmacy. Per chart, pt is up ~5lbs since admission. Pt +1.4L on his I & Os. Palliative care following.   Medications reviewed and include: colace, lovenox, lasix, insulin, synthroid, solu-medrol, juven, protonix, paxil, miralax, zosyn, propofol  Labs reviewed: Na 146(H), K 4.8 wnl, BUN 135(H), P 4.3 wnl, Mg 3.7(H) Wbc- 28.7(H) Cbgs- 212, 187, 189, 251 x 24 hrs  Patient is currently intubated on ventilator support MV: 11.8 L/min Temp (24hrs), Avg:98.7 F (37.1 C), Min:96.8 F (36 C), Max:100.4 F (38 C)  Propofol: 26.4 ml/hr- provides 697kcal/day   MAP >59mmHg   UOP-   NUTRITION - FOCUSED PHYSICAL EXAM:  Flowsheet Row Most  Recent Value  Orbital Region Mild depletion  Upper Arm Region Moderate depletion  Thoracic and Lumbar Region Mild depletion  Buccal Region Mild depletion  Temple Region Mild depletion  Clavicle Bone Region Mild depletion  Clavicle and Acromion Bone Region Mild depletion  Scapular Bone Region Mild depletion  Dorsal Hand Unable to assess  Patellar Region Severe depletion  Anterior Thigh Region Severe depletion  Posterior Calf Region Severe depletion  Edema (RD Assessment) None  Hair Reviewed  Eyes Reviewed  Mouth Reviewed  Skin Reviewed  Nails Reviewed   Diet Order:   Diet Order             Diet NPO time specified  Diet effective now                  EDUCATION NEEDS:   No education needs have been identified at this time  Skin:  Skin Assessment: Reviewed RN Assessment (Stage I sacrum, DTI face)  Last BM:  4/2- type 2  Height:   Ht Readings from Last 1 Encounters:  03/18/24 6' (1.829 m)    Weight:   Wt Readings from Last 1 Encounters:  03/20/24 88.8 kg    Ideal Body Weight:  80.9 kg  BMI:  Body mass index is 26.55 kg/m.  Estimated Nutritional Needs:   Kcal:  2039kcal/day  Protein:  120-135g/day  Fluid:  2.0-2.3L/day  Betsey Holiday MS, RD, LDN If unable to be reached, please send secure chat to "RD inpatient" available from 8:00a-4:00p daily

## 2024-03-20 NOTE — IPAL (Signed)
  Interdisciplinary Goals of Care Family Meeting   Date carried out: 03/20/2024  Location of the meeting: Bedside  Member's involved: Physician and Family Member or next of kin  Durable Power of Attorney or acting medical decision maker: Patient's wife and son.    Discussion: We discussed goals of care for Nathaniel Ibarra.  I met with the patient's wife as well as the son at the bedside to discuss goals of care as well as patient's current medical condition.  We discussed his overall declining status with severe respiratory failure and severe ARDS which is not improving and potentially getting worse.  Explained that compared to yesterday, we attempted to place the patient in the prone position overnight without any change or improvement in oxygenation.  Also explained that this morning, his oxygenation is worse and he is required more FiO2 to support his oxygenation.  Explained to family that the likelihood of meaningful recovery is dismal and he is unlikely to successfully wean off the ventilator.  Family (wife) are clear about not wanting tracheostomy tube placement.  Explored other options that would include changing goals of care to comfort measures which the wife is supportive of but the son is having trouble accepting.  We will continue to have these conversations regularly.  Appreciate input from palliative care.  Code status:   Code Status: Do not attempt resuscitation (DNR) PRE-ARREST INTERVENTIONS DESIRED   Disposition: Continue current acute care  Time spent for the meeting: 20 minutes    Raechel Chute, MD  03/20/2024, 1:00 PM

## 2024-03-20 NOTE — Progress Notes (Signed)
 Daily Progress Note   Patient Name: Nathaniel Ibarra       Date: 03/20/2024 DOB: 06-22-1941  Age: 83 y.o. MRN#: [id number] Attending Physician: Raechel Chute, MD Primary Care Physician: Sherron Monday, MD Admit Date: 03/10/2024  Reason for Consultation/Follow-up: Establishing goals of care  Subjective: Notes and labs reviewed. In to see patient. He is resting in bed on ventilator. No family at bedside. Case discussed with primary RN.   Called to speak with wife. She is currently in a healthcare appt herself and is unavailable. Will attempt to follow up later today.    Length of Stay: 10  Current Medications: Scheduled Meds:   amiodarone  200 mg Per Tube Daily   Chlorhexidine Gluconate Cloth  6 each Topical Daily   docusate  100 mg Per Tube BID   enoxaparin (LOVENOX) injection  40 mg Subcutaneous Q24H   feeding supplement (PROSource TF20)  60 mL Per Tube Daily   free water  200 mL Per Tube Q4H   furosemide  40 mg Intravenous Daily   insulin aspart  0-20 Units Subcutaneous Q4H   insulin aspart  2 Units Subcutaneous Q4H   ipratropium-albuterol  3 mL Nebulization Q6H   levothyroxine  100 mcg Per Tube Q0600   methylPREDNISolone (SOLU-MEDROL) injection  40 mg Intravenous Q12H   nutrition supplement (JUVEN)  1 packet Per Tube BID BM   mouth rinse  15 mL Mouth Rinse Q2H   pantoprazole (PROTONIX) IV  40 mg Intravenous QHS   PARoxetine  20 mg Per Tube Daily   polyethylene glycol  17 g Per Tube Daily   rosuvastatin  10 mg Per Tube Daily    Continuous Infusions:  feeding supplement (VITAL AF 1.2 CAL) 60 mL/hr at 03/20/24 0640   piperacillin-tazobactam (ZOSYN)  IV 12.5 mL/hr at 03/20/24 0640   propofol (DIPRIVAN) infusion 50 mcg/kg/min (03/20/24 0727)    PRN Meds: acetaminophen  **OR** acetaminophen, chlorpheniramine-HYDROcodone, fentaNYL (SUBLIMAZE) injection, ipratropium-albuterol, ondansetron **OR** ondansetron (ZOFRAN) IV, mouth rinse, mouth rinse  Physical Exam Constitutional:      Comments: Eyes closed.   Pulmonary:     Comments: On ventilator.             Vital Signs: BP (!) 120/52   Pulse 83   Temp 99.9 F (37.7 C)   Resp (!)  23   Ht 6' (1.829 m)   Wt 88.8 kg   SpO2 94%   BMI 26.55 kg/m  SpO2: SpO2: 94 % O2 Device: O2 Device: Ventilator O2 Flow Rate: O2 Flow Rate (L/min): 60 L/min  Intake/output summary:  Intake/Output Summary (Last 24 hours) at 03/20/2024 1016 Last data filed at 03/20/2024 1000 Gross per 24 hour  Intake 2980.65 ml  Output 2575 ml  Net 405.65 ml   LBM: Last BM Date : 03/20/24 Baseline Weight: Weight: 86.2 kg Most recent weight: Weight: 88.8 kg    Patient Active Problem List   Diagnosis Date Noted   Influenza A 03/18/2024   Pressure injury of skin 03/18/2024   Malnutrition of moderate degree 03/14/2024   Community acquired pneumonia 03/11/2024   Dyslipidemia 03/11/2024   Multifocal pneumonia 03/11/2024   Change in bowel habits 01/04/2024   Polyp of ascending colon 01/04/2024   Normocytic anemia 03/29/2023   Acute metabolic encephalopathy 03/29/2023   CAP (community acquired pneumonia) 03/28/2023   Sepsis (HCC) 03/28/2023   Paroxysmal atrial fibrillation (HCC) 03/28/2023   Depression 03/28/2023   Abnormal LFTs 03/28/2023   Total knee replacement status, 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   Blood clotting disorder (HCC) 04/07/2022   Primary osteoarthritis of left knee 03/07/2022   Acute hypoxic respiratory failure (HCC)    Pneumonia due to COVID-19 virus 12/28/2020   Testicular abscess 11/21/2020   Acute lower UTI 11/21/2020   Chronic systolic CHF (congestive heart failure) (HCC) 11/21/2020   AF (paroxysmal atrial fibrillation) (HCC)    Essential hypertension    Hypothyroidism    Acute on chronic combined systolic and  diastolic CHF (congestive heart failure) (HCC) 05/16/2020   Acute on chronic systolic CHF (congestive heart failure) (HCC) 05/16/2020   BPH (benign prostatic hyperplasia)    GERD (gastroesophageal reflux disease)    Hypertensive urgency    Bradycardia 05/08/2018   Situational depression 05/08/2018   CHF (congestive heart failure) (HCC) 11/13/2017   Atrial fibrillation with RVR (HCC) 11/12/2017   Acute respiratory distress syndrome (ARDS) (HCC) 11/12/2017   Elevated troponin 11/12/2017   Coronary artery disease 09/02/2015   Acute coronary syndrome (HCC) 09/01/2015   Unstable angina (HCC) 09/01/2015   Chest pain 08/16/2015    Palliative Care Assessment & Plan    Recommendations/Plan:  Wife is unavailable at this time. Will reattmept later today.    Code Status:    Code Status Orders  (From admission, onward)           Start     Ordered   03/16/24 1248  Do not attempt resuscitation (DNR) Pre-Arrest Interventions Desired  (Code Status)  Continuous       Question Answer Comment  If pulseless and not breathing No CPR or chest compressions.   In Pre-Arrest Conditions (Patient Has Pulse and Is Breathing) May intubate, use advanced airway interventions and cardioversion/ACLS medications if appropriate or indicated. May transfer to ICU.   Consent: Discussion documented in EHR or advanced directives reviewed      03/16/24 1247           Code Status History     Date Active Date Inactive Code Status Order ID Comments User Context   03/10/2024 2223 03/16/2024 1247 Full Code [id number]  [facility] Vernetta Honey, MD ED   03/28/2023 1324 04/03/2023 2018 Full Code [id number]  Lorretta Harp, MD ED   10/24/2022 1137 10/25/2022 2323 Full Code [id number]  Donato Heinz, MD Inpatient   12/28/2020 1950 01/13/2021 2155 Full Code [id number]  Mansy,  Vernetta Honey, MD ED   11/21/2020 2009 11/27/2020 1945 Full Code [id number]  Hillary Bow, DO ED   05/16/2020 1227 05/18/2020 1503 Full Code [id number]  Lucile Shutters, MD  ED   11/12/2017 1859 11/17/2017 2048 Full Code [id number]  Marguarite Arbour, MD Inpatient   09/01/2015 1337 09/02/2015 1353 Full Code [id number]  Alwyn Pea, MD Inpatient   09/01/2015 1331 09/01/2015 1337 Full Code [id number]  Laurier Nancy, MD Inpatient   09/01/2015 1254 09/01/2015 1331 Full Code [id number]  Laurier Nancy, MD Inpatient   08/16/2015 1631 08/17/2015 1627 Full Code [id number]  Houston Siren, MD Inpatient      Advance Directive Documentation    Flowsheet Row Most Recent Value  Type of Advance Directive Healthcare Power of Attorney  Pre-existing out of facility DNR order (yellow form or pink MOST form) --  "MOST" Form in Place? --       Thank you for allowing the Palliative Medicine Team to assist in the care of this patient.    Morton Stall, NP  Please contact Palliative Medicine Team phone at [phone] for questions and concerns.

## 2024-03-21 DIAGNOSIS — J9601 Acute respiratory failure with hypoxia: Secondary | ICD-10-CM | POA: Diagnosis not present

## 2024-03-21 DIAGNOSIS — Z7189 Other specified counseling: Secondary | ICD-10-CM | POA: Diagnosis not present

## 2024-03-21 LAB — CBC
HCT: 38.7 % — ABNORMAL LOW (ref 39.0–52.0)
Hemoglobin: 12.2 g/dL — ABNORMAL LOW (ref 13.0–17.0)
MCH: 31.7 pg (ref 26.0–34.0)
MCHC: 31.5 g/dL (ref 30.0–36.0)
MCV: 100.5 fL — ABNORMAL HIGH (ref 80.0–100.0)
Platelets: 340 10*3/uL (ref 150–400)
RBC: 3.85 MIL/uL — ABNORMAL LOW (ref 4.22–5.81)
RDW: 14 % (ref 11.5–15.5)
WBC: 30.5 10*3/uL — ABNORMAL HIGH (ref 4.0–10.5)
nRBC: 0 % (ref 0.0–0.2)

## 2024-03-21 LAB — RENAL FUNCTION PANEL
Albumin: 2.2 g/dL — ABNORMAL LOW (ref 3.5–5.0)
Anion gap: 11 (ref 5–15)
BUN: 170 mg/dL — ABNORMAL HIGH (ref 8–23)
CO2: 39 mmol/L — ABNORMAL HIGH (ref 22–32)
Calcium: 7.9 mg/dL — ABNORMAL LOW (ref 8.9–10.3)
Chloride: 96 mmol/L — ABNORMAL LOW (ref 98–111)
Creatinine, Ser: 1.23 mg/dL (ref 0.61–1.24)
GFR, Estimated: 59 mL/min — ABNORMAL LOW (ref >60)
Glucose, Bld: 195 mg/dL — ABNORMAL HIGH (ref 70–99)
Phosphorus: 3.8 mg/dL (ref 2.5–4.6)
Potassium: 4.7 mmol/L (ref 3.5–5.1)
Sodium: 146 mmol/L — ABNORMAL HIGH (ref 135–145)

## 2024-03-21 LAB — GLUCOSE, CAPILLARY
Glucose-Capillary: 175 mg/dL — ABNORMAL HIGH (ref 70–99)
Glucose-Capillary: 218 mg/dL — ABNORMAL HIGH (ref 70–99)
Glucose-Capillary: 215 mg/dL — ABNORMAL HIGH (ref 70–99)
Glucose-Capillary: 223 mg/dL — ABNORMAL HIGH (ref 70–99)
Glucose-Capillary: 173 mg/dL — ABNORMAL HIGH (ref 70–99)

## 2024-03-21 LAB — BLOOD GAS, ARTERIAL
Acid-Base Excess: 2.8 mmol/L — ABNORMAL HIGH (ref 0.0–2.0)
Bicarbonate: 31 mmol/L — ABNORMAL HIGH (ref 20.0–28.0)
FIO2: 75 %
MECHVT: 500 mL
Mechanical Rate: 20
PEEP: 12 cmH2O
Patient temperature: 37
pCO2 arterial: 63 mmHg — ABNORMAL HIGH (ref 32–48)
pH, Arterial: 7.3 — ABNORMAL LOW (ref 7.35–7.45)
pO2, Arterial: 74 mmHg — ABNORMAL LOW (ref 83–108)

## 2024-03-21 LAB — MAGNESIUM: Magnesium: 3.9 mg/dL — ABNORMAL HIGH (ref 1.7–2.4)

## 2024-03-21 MED ORDER — METHYLPREDNISOLONE SODIUM SUCC 40 MG IJ SOLR
40.0000 mg | INTRAMUSCULAR | Status: DC
Start: 2024-03-22 — End: 2024-03-24
  Administered 2024-03-22 – 2024-03-24 (×3): 40 mg via INTRAVENOUS
  Filled 2024-03-21 (×3): qty 1

## 2024-03-21 MED ORDER — IPRATROPIUM-ALBUTEROL 0.5-2.5 (3) MG/3ML IN SOLN
3.0000 mL | Freq: Two times a day (BID) | RESPIRATORY_TRACT | Status: DC
  Administered 2024-03-21 – 2024-03-24 (×6): 3 mL via RESPIRATORY_TRACT
  Filled 2024-03-21 (×6): qty 3

## 2024-03-21 MED ORDER — PROPOFOL 1000 MG/100ML IV EMUL: 0.0000 ug/kg/min | INTRAVENOUS | Status: DC

## 2024-03-21 NOTE — TOC Progression Note (Signed)
 Transition of Care [facility]) - Progression Note    Patient Details  Name: Nathaniel Ibarra MRN: [id number] Date of Birth: 1941-08-21  [facility] of [facility] CM/SW Contact  Margarito Liner, LCSW Phone Number: 03/21/2024, 12:08 PM  Clinical Narrative:   TOC continues to follow progress.  Expected Discharge Plan and Services                                               Social Determinants of Health (SDOH) Interventions SDOH Screenings   Food Insecurity: No Food Insecurity (03/11/2024)  Housing: Low Risk  (03/11/2024)  Transportation Needs: No Transportation Needs (03/11/2024)  Utilities: Not At Risk (03/11/2024)  Depression (PHQ2-9): Medium Risk (12/14/2023)  Financial Resource Strain: Low Risk  (09/07/2023)   Received from [facility]  Social Connections: Moderately Isolated (03/11/2024)  Tobacco Use: Low Risk  (03/10/2024)  Recent Concern: Tobacco Use - Medium Risk (03/03/2024)   Received from [facility]    Readmission Risk Interventions     No data to display

## 2024-03-21 NOTE — Progress Notes (Addendum)
 Daily Progress Note   Patient Name: Nathaniel Ibarra       Date: 03/21/2024 DOB: 1941-07-05  Age: 83 y.o. MRN#: [id number] Attending Physician: Raechel Chute, MD Primary Care Physician: Sherron Monday, MD Admit Date: 03/10/2024  Reason for Consultation/Follow-up: Establishing goals of care  Subjective: Notes and labs reviewed. In to see patient. He is in bed on ventilator. Wife is at bedside. She discusses that they have been married 60 years. She states she is a woman of faith, as is the rest of her family. She states she does not want to loose her husband but understands he is suffering and does not want this for him. She states her son is having difficulty processing this.  Son entered room. He states he has no questions and is numb. With conversation, he discusses that he knows his father is dying. Discussed his status and discussed suffering. He discusses his own health issues and the need to see a doctor himself tomorrow.  With conversation they discuss likely transitioning to comfort care Saturday. We discussed that given his subcutaneous emphysema, there is a chance that depending on his status upon shifting to comfort care, a decision may be made to disconnect him from the ventilator with the ET tube left in place to provide airway support and an ability to breathe. They understand this.  PMT will follow .  Length of Stay: 11  Current Medications: Scheduled Meds:   amiodarone  200 mg Per Tube Daily   Chlorhexidine Gluconate Cloth  6 each Topical Daily   docusate  100 mg Per Tube BID   enoxaparin (LOVENOX) injection  40 mg Subcutaneous Q24H   free water  200 mL Per Tube Q4H   furosemide  40 mg Intravenous Daily   insulin aspart  0-20 Units Subcutaneous Q4H   insulin aspart  4  Units Subcutaneous Q4H   ipratropium-albuterol  3 mL Nebulization BID   levothyroxine  100 mcg Per Tube Q0600   [START ON 03/22/2024] methylPREDNISolone (SOLU-MEDROL) injection  40 mg Intravenous Q24H   multivitamin with minerals  1 tablet Per Tube Daily   nutrition supplement (JUVEN)  1 packet Per Tube BID BM   mouth rinse  15 mL Mouth Rinse Q2H   pantoprazole (PROTONIX) IV  40 mg Intravenous QHS   PARoxetine  20  mg Per Tube Daily   polyethylene glycol  17 g Per Tube Daily   rosuvastatin  10 mg Per Tube Daily    Continuous Infusions:  feeding supplement (VITAL HIGH PROTEIN) 1,000 mL (03/21/24 1100)   piperacillin-tazobactam (ZOSYN)  IV 3.375 g (03/21/24 1112)   propofol (DIPRIVAN) infusion 25 mcg/kg/min (03/21/24 1207)    PRN Meds: acetaminophen **OR** acetaminophen, bisacodyl, chlorpheniramine-HYDROcodone, fentaNYL (SUBLIMAZE) injection, ipratropium-albuterol, ondansetron **OR** ondansetron (ZOFRAN) IV, mouth rinse, mouth rinse  Physical Exam Constitutional:      Comments: Eyes  closed.   Pulmonary:     Comments: Ventilator in place.  Skin:    General: Skin is warm and dry.             Vital Signs: BP 115/67   Pulse (!) 104   Temp 99.9 F (37.7 C)   Resp (!) 26   Ht 6' (1.829 m)   Wt 72.5 kg   SpO2 92%   BMI 21.68 kg/m  SpO2: SpO2: 92 % O2 Device: O2 Device: Ventilator O2 Flow Rate: O2 Flow Rate (L/min): 60 L/min  Intake/output summary:  Intake/Output Summary (Last 24 hours) at 03/21/2024 1430 Last data filed at 03/21/2024 1100 Gross per 24 hour  Intake 1199.89 ml  Output 2200 ml  Net -1000.11 ml   LBM: Last BM Date : 03/20/24 Baseline Weight: Weight: 86.2 kg Most recent weight: Weight: 72.5 kg        Patient Active Problem List   Diagnosis Date Noted   Influenza A 03/18/2024   Pressure injury of skin 03/18/2024   Malnutrition of moderate degree 03/14/2024   Community acquired pneumonia 03/11/2024   Dyslipidemia 03/11/2024   Multifocal pneumonia  03/11/2024   Change in bowel habits 01/04/2024   Polyp of ascending colon 01/04/2024   Normocytic anemia 03/29/2023   Acute metabolic encephalopathy 03/29/2023   CAP (community acquired pneumonia) 03/28/2023   Sepsis (HCC) 03/28/2023   Paroxysmal atrial fibrillation (HCC) 03/28/2023   Depression 03/28/2023   Abnormal LFTs 03/28/2023   Total knee replacement status, 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   Blood clotting disorder (HCC) 04/07/2022   Primary osteoarthritis of left knee 03/07/2022   Acute hypoxic respiratory failure (HCC)    Pneumonia due to COVID-19 virus 12/28/2020   Testicular abscess 11/21/2020   Acute lower UTI 11/21/2020   Chronic systolic CHF (congestive heart failure) (HCC) 11/21/2020   AF (paroxysmal atrial fibrillation) (HCC)    Essential hypertension    Hypothyroidism    Acute on chronic combined systolic and diastolic CHF (congestive heart failure) (HCC) 05/16/2020   Acute on chronic systolic CHF (congestive heart failure) (HCC) 05/16/2020   BPH (benign prostatic hyperplasia)    GERD (gastroesophageal reflux disease)    Hypertensive urgency    Bradycardia 05/08/2018   Situational depression 05/08/2018   CHF (congestive heart failure) (HCC) 11/13/2017   Atrial fibrillation with RVR (HCC) 11/12/2017   Acute respiratory distress syndrome (ARDS) (HCC) 11/12/2017   Elevated troponin 11/12/2017   Coronary artery disease 09/02/2015   Acute coronary syndrome (HCC) 09/01/2015   Unstable angina (HCC) 09/01/2015   Chest pain 08/16/2015    Palliative Care Assessment & Plan   Recommendations/Plan: Family discusses desire to likely shift to comfort care Saturday as son has a doctor's appt of his own tomorrow he needs to attend.  They are aware that given his subcutaneous emphysema, there is a chance that depending on his status upon shifting to comfort care, a decision may be made to disconnect him from the ventilator with  the ET tube left in place to provide airway support and an  ability to breathe.  PMT will follow.   Code Status:    Code Status Orders  (From admission, onward)           Start     Ordered   03/16/24 1248  Do not attempt resuscitation (DNR) Pre-Arrest Interventions Desired  (Code Status)  Continuous       Question Answer Comment  If pulseless and not breathing No CPR or chest compressions.   In Pre-Arrest Conditions (Patient Has Pulse and Is Breathing) May intubate, use advanced airway interventions and cardioversion/ACLS medications if appropriate or indicated. May transfer to ICU.   Consent: Discussion documented in EHR or advanced directives reviewed      03/16/24 1247           Code Status History     Date Active Date Inactive Code Status Order ID Comments User Context   03/10/2024 2223 03/16/2024 1247 Full Code [id number]  Mansy, Vernetta Honey, MD ED   03/28/2023 1324 04/03/2023 2018 Full Code [id number]  Lorretta Harp, MD ED   10/24/2022 1137 10/25/2022 2323 Full Code [id number]  Donato Heinz, MD Inpatient   12/28/2020 1950 01/13/2021 2155 Full Code [id number]  Mansy, Vernetta Honey, MD ED   11/21/2020 2009 11/27/2020 1945 Full Code [id number]  Hillary Bow, DO ED   05/16/2020 1227 05/18/2020 1503 Full Code [id number]  Lucile Shutters, MD ED   11/12/2017 1859 11/17/2017 2048 Full Code [id number]  Marguarite Arbour, MD Inpatient   09/01/2015 1337 09/02/2015 1353 Full Code [id number]  Alwyn Pea, MD Inpatient   09/01/2015 1331 09/01/2015 1337 Full Code [id number]  Laurier Nancy, MD Inpatient   09/01/2015 1254 09/01/2015 1331 Full Code [id number]  Laurier Nancy, MD Inpatient   08/16/2015 1631 08/17/2015 1627 Full Code [id number]  Houston Siren, MD Inpatient      Advance Directive Documentation    Flowsheet Row Most Recent Value  Type of Advance Directive Healthcare Power of Attorney  Pre-existing out of facility DNR order (yellow form or pink MOST form) --  "MOST" Form in Place? --       Thank you for allowing the Palliative Medicine Team to  assist in the care of this patient.    Morton Stall, NP  Please contact Palliative Medicine Team phone at [phone] for questions and concerns.

## 2024-03-21 NOTE — Progress Notes (Signed)
 NAME:  GORDAN GRELL, MRN:  [id number], DOB:  January 25, 1941, LOS: 11 ADMISSION DATE:  03/10/2024, CHIEF COMPLAINT:  Respiratory Failure, ARDS   History of Present Illness:   Taniela Feltus is an 83 yo with a PMH significant for HTN, HLD, HFrEF, CM, CAD, Oxygen dependent 4 Liters (which he rarely uses), GERD, BPH, Depression, Hypothyroidism that presented to the ER with worsening dyspnea.  Patient previously tested positive for Influenza A with associated PNA 03/03/24 and completed a course of Tamiflu, prednisone taper, and antibiotics. Upon evaluation in the ER he was afebrile, 97% on 8 Liters, CXR low lung volumes with bronchitic changes and questionable developing right midlung zone and left mid to lower lung zone airspace opacities. He was started on antibiotics and HFNC, cultures sent. Condition worsened and a rapid response was called secondary to increased oxygen requirements and work of breath. Was placed on 100% bipap, lasix 40 mg x 1, Morphine, ABG, CXR, D-dimer sent. PCCM was consulted for medical management of patients Respiratory failure. Patient was intubated on 3/25 secondary to worsening respiratory status and worsening hypoxemia.  Pertinent  Medical History  HTN, HLD, HFrEF, CM, CAD, Oxygen dependent 4 Liters (which he rarely uses), GERD, BPH, Depression, Hypothyroidism  Significant Hospital Events: Including procedures, antibiotic start and stop dates in addition to other pertinent events   3/24: admit to [facility] for respiratory failure, HFNC to BiPAP, transfer to ICU 3/25: in ICU with worsening oxygen requirements, intubated 3/26: vented 3/28: palliative care consulted 3/31: vented, wife updated at the bedside, remains sedated 4/01: vented and sedated. Wife and son updated at the bedside. Placed in prone position overnight 4/02: back supine in AM, worsening oxygenation, family updated at the bedside. 4/03: oxygen requirements worsened overnight  Interim History / Subjective:   Remains intubated and sedated, unresponsive on sedation.  Objective   Blood pressure 138/73, pulse (!) 114, temperature 99.9 F (37.7 C), resp. rate (!) 28, height 6' (1.829 m), weight 72.5 kg, SpO2 91%.    Vent Mode: PRVC FiO2 (%):  [50 %-85 %] 60 % Set Rate:  [24 bmp] 24 bmp Vt Set:  [500 mL] 500 mL PEEP:  [10 cmH20] 10 cmH20 Plateau Pressure:  [23 cmH20-26 cmH20] 25 cmH20   Intake/Output Summary (Last 24 hours) at 03/21/2024 0925 Last data filed at 03/21/2024 0900 Gross per 24 hour  Intake 2443.1 ml  Output 2575 ml  Net -131.9 ml   Filed Weights   03/19/24 0346 03/20/24 0500 03/21/24 0500  Weight: 87.1 kg 88.8 kg 72.5 kg    Examination: Physical Exam Constitutional:      General: He is not in acute distress.    Appearance: He is ill-appearing.  HENT:     Mouth/Throat:     Mouth: Mucous membranes are moist.     Comments: ETT in place Cardiovascular:     Rate and Rhythm: Regular rhythm. Tachycardia present.     Pulses: Normal pulses.     Heart sounds: Normal heart sounds.  Pulmonary:     Breath sounds: Rhonchi present. No wheezing.  Skin:    Comments: Diffuse subcutaneous emphysema overlying the chest wall and extending to the abdominal wall  Neurological:     Mental Status: He is disoriented.      Assessment & Plan:   83 year old male with recent influenza infection presenting with worsening hypoxic respiratory failure requiring intubation and mechanical ventilation.  #Acute Hypoxic Respiratory Failure #Influenza A Pneumonia #Acute Respiratory Distress Syndrome #Superimposed Bacterial Pneumonia #Subcutaneous  Emphysema #Atrial Fibrillation #Toxic Metabolic Encephalopathy #HFpEF #Hypothyroidism  Neuro: Toxic metabolic encephalopathy secondary to severe pneumonia as well as sedation needs in the setting of severe ARDS. Currently on propofol and fentanyl for analgosedation. Goal RASS -3. CV: Has a history of HFpEF, with recent echocardiogram showing elevated  PASP which could be secondary to increased PVR or heart failure. Maintained on 40 mg of IV furosemide daily with overall goal net even which he is maintaining. Also on amiodarone which he takes outpatient for atrial fibrillation. Hemodynamically stable. Pulm: Acute hypoxic respiratory failure with severe ARDS secondary to influenza pneumonia and likely superimposed bacterial infection. Poor P:F ratio, without much improvement while in the prone position, with worsening oxygen requirements when back in supine position. Chest CT showed bilateral lower lobe consolidations, GGO's, and pneumomediastinum but no pneumothorax. Optimized PEEP and FiO2 to ARDS table, minimal improvement in prone position overnight.   ARDS management and interventions: -Prone: Proned without much improvement, hold further -Pee: 40 IV lasix daily -PEEP: 10 cmH2O, on PEEP table -Paralysis: not initiated -Pulmonary vasodilators: not initiated  -Perfusion & ECMO: not a candidate given age -Permissive hypercapnia: allowing -Low tidal volume ventilation: 450 mL / 6 cc/kg of IBW -Steroids: on methylpred  GI: On tube feeds, PPI for SUP Renal: kidney function appears to be within normal though his BUN is significantly elevated. Continue with gentle diuresis and attempt to avoid nephrotoxins Endo: On levothyroxine for hypothyroidism. On ICU glycemic protocol. On methylprednisolone for ARDS which we'll continue. Hem/Onc: Enoxaparin subQ for DVT prophylaxis. Does not appear to be anticoagulated outpatient. ID: Influenza A infection with possible superimposed bacterial infection resulting in severe pneumonia. He has finished a course of antibiotics on admission, with cultures not isolating any organisms. Restarted broad spectrum antibiotics given lower lobe consolidations. Cultures remained negative. Other: goals of care conversation with family at the bedside yesterday (wife and son). Explained overall poor prognosis, recommended  consideration for comfort care. Wife categorically opposed to tracheostomy, and feels he is suffering and is considering changing goals of care. Son is having difficulty coming to terms with how ill Mr. Lantzy is. Will re-visit conversation today.  Best Practice (right click and "Reselect all SmartList Selections" daily)   Diet/type: tubefeeds DVT prophylaxis LMWH Pressure ulcer(s): N/A GI prophylaxis: PPI Lines: N/A Foley:  Yes, and it is still needed Code Status:  DNR Last date of multidisciplinary goals of care discussion [03/21/2024]  Labs   CBC: Recent Labs  Lab 03/17/24 0311 03/18/24 0305 03/19/24 0440 03/20/24 0332 03/21/24 0515  WBC 26.1* 33.1* 33.3* 28.7* 30.5*  HGB 13.2 13.8 13.3 12.6* 12.2*  HCT 42.4 44.1 42.1 40.6 38.7*  MCV 100.5* 102.1* 101.0* 102.8* 100.5*  PLT 415* 432* 411* 342 340    Basic Metabolic Panel: Recent Labs  Lab 03/16/24 0622 03/16/24 2203 03/17/24 0311 03/18/24 0305 03/19/24 0440 03/20/24 0332 03/21/24 0515  NA 138   < > 138 141 144 146* 146*  K 5.2*   < > 5.2* 5.5* 5.4* 4.8 4.7  CL 96*   < > 97* 97* 98 97* 96*  CO2 35*   < > 35* 35* 38* 38* 39*  GLUCOSE 203*   < > 241* 209* 210* 281* 195*  BUN 93*   < > 104* 112* 126* 135* 170*  CREATININE 0.97   < > 1.00 0.94 1.03 1.08 1.23  CALCIUM 8.3*   < > 7.9* 8.2* 8.3* 8.1* 7.9*  MG 3.4*  --  3.3* 3.8*  --  3.7*  3.9*  PHOS 2.3*  --  2.5 5.2* 4.8* 4.3 3.8   < > = values in this interval not displayed.   GFR: Estimated Creatinine Clearance: 47.5 mL/min (by C-G formula based on SCr of 1.23 mg/dL). Recent Labs  Lab 03/18/24 0305 03/18/24 1728 03/19/24 0440 03/20/24 0332 03/21/24 0515  PROCALCITON  --  0.11  --   --   --   WBC 33.1*  --  33.3* 28.7* 30.5*    Liver Function Tests: Recent Labs  Lab 03/19/24 0440 03/20/24 0332 03/21/24 0515  ALBUMIN 2.3* 2.1* 2.2*   No results for input(s): "LIPASE", "AMYLASE" in the last 168 hours. No results for input(s): "AMMONIA" in the last  168 hours.  ABG    Component Value Date/Time   PHART 7.42 03/20/2024 0422   PCO2ART 76 (HH) 03/20/2024 0422   PO2ART 99 03/20/2024 0422   HCO3 49.3 (H) 03/20/2024 0422   TCO2 26 09/07/2007 1236   O2SAT 97.7 03/20/2024 0422     Coagulation Profile: No results for input(s): "INR", "PROTIME" in the last 168 hours.  Cardiac Enzymes: No results for input(s): "CKTOTAL", "CKMB", "CKMBINDEX", "TROPONINI" in the last 168 hours.  HbA1C: Hgb A1c MFr Bld  Date/Time Value Ref Range Status  07/26/2023 09:57 AM 6.1 (H) 4.8 - 5.6 % Final    Comment:             Prediabetes: 5.7 - 6.4          Diabetes: >6.4          Glycemic control for adults with diabetes: <7.0   12/30/2020 04:17 AM 5.5 4.8 - 5.6 % Final    Comment:    (NOTE) Pre diabetes:          5.7%-6.4%  Diabetes:              >6.4%  Glycemic control for   <7.0% adults with diabetes     CBG: Recent Labs  Lab 03/20/24 1559 03/20/24 2002 03/20/24 2308 03/21/24 0415 03/21/24 0748  GLUCAP 212* 199* 245* 175* 218*    Review of Systems:   Unable to obtain  Past Medical History:  He,  has a past medical history of Aortic atherosclerosis (HCC), Basal cell carcinoma (05/18/2022), Bladder cancer (HCC) (2008), BPH (benign prostatic hyperplasia), Bradycardia, Cardiomyopathy (HCC), Cataract, bilateral, Coronary artery disease, Depression, GERD (gastroesophageal reflux disease), HFrEF (heart failure with reduced ejection fraction) (HCC), History of 2019 novel coronavirus disease (COVID-19), HLD (hyperlipidemia), Hypertension, Hypothyroidism, LBBB (left bundle branch block), Long term current use of amiodarone, NSTEMI (non-ST elevated myocardial infarction) (HCC) (2016), Osteoarthritis, Paroxysmal atrial fibrillation (HCC), Peripheral edema, Pneumonia due to COVID-19 virus (12/2020), Sleep difficulties, 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   Surgical History:   Past  Surgical History:  Procedure Laterality Date   BIOPSY  01/04/2024   Procedure: BIOPSY;  Surgeon: Midge Minium, MD;  Location: [facility] ENDOSCOPY;  Service: Endoscopy;;   CARDIAC CATHETERIZATION N/A 09/01/2015   Procedure: Left Heart Cath and Coronary Angiography;  Surgeon: Laurier Nancy, MD;  Location: [facility] INVASIVE CV LAB;  Service: Cardiovascular;  Laterality: N/A;   CARDIAC CATHETERIZATION N/A 09/01/2015   Procedure: Coronary Stent Intervention;  Surgeon: Alwyn Pea, MD;  Location: [facility] INVASIVE CV LAB;  Service: Cardiovascular;  Laterality: N/A;   CATARACT EXTRACTION W/PHACO Left 11/23/2016   Procedure: CATARACT EXTRACTION PHACO AND INTRAOCULAR LENS PLACEMENT (IOC);  Surgeon: Lockie Mola, MD;  Location: [facility] SURGERY CNTR;  Service:  Ophthalmology;  Laterality: Left;   CATARACT EXTRACTION W/PHACO Right 02/01/2017   Procedure: CATARACT EXTRACTION PHACO AND INTRAOCULAR LENS PLACEMENT (IOC) Complicated  right toric lens;  Surgeon: Lockie Mola, MD;  Location: [facility] SUR[facility]ERY [facility];  Service: Ophthalmology;  Laterality: Right;  Malyugin toric Lens   COLONOSCOPY WITH PROPOFOL N/A 01/04/2024   Procedure: COLONOSCOPY WITH PROPOFOL;  Surgeon: Midge Minium, MD;  Location[facility] ENDOSCOPY;  Service: Endoscopy;  Laterality: N/A;   KNEE ARTHROPLASTY Left 10/24/2022   Procedure: COMPUTER ASSISTED TOTAL KNEE ARTHROPLASTY;  Surgeon: Donato Heinz, MD;  Location: [facility] ORS;  Service: Orthopedics;  Laterality: Left;   LEFT HEART CATH AND CORONARY ANGIOGRAPHY Right 11/14/2017   Procedure: LEFT HEART CATH AND CORONARY ANGIOGRAPHY;  Surgeon: Laurier Nancy, MD;  Location: [facility] INVASIVE CV LAB;  Service: Cardiovascular;  Laterality: Right;   POLYPECTOMY  01/04/2024   Procedure: POLYPECTOMY;  Surgeon: Midge Minium, MD;  Location: [facility] ENDOSCOPY;  Service: Endoscopy;;   SCROTAL EXPLORATION Left 11/24/2020   Procedure: SCROTUM EXPLORATION WITH LEFT ORCHIECTOMY;  Surgeon: Noel Christmas, MD;   Location: [facility] ORS;  Service: Urology;  Laterality: Left;   TONSILLECTOMY       Social History:   reports that he has never smoked. He has never used smokeless tobacco. He reports that he does not drink alcohol and does not use drugs.   Family History:  His family history includes Emphysema in his father and mother.   Allergies No Known Allergies   Home Medications  Prior to Admission medications   Medication Sig Start Date End Date Taking? Authorizing Provider  acetaminophen (TYLENOL) 325 MG tablet Take 2 tablets (650 mg total) by mouth every 6 (six) hours as needed for mild pain or headache (fever >/= 101). 01/13/21  Yes Lonia Blood, MD  albuterol (VENTOLIN HFA) 108 (90 Base) MCG/ACT inhaler Inhale 1 puff into the lungs every 4 (four) hours as needed. 10/27/21  Yes [provider]  amiodarone (PACERONE) 200 MG tablet Take 1 tablet (200 mg total) by mouth daily. 05/18/20  Yes Wieting, Richard, MD  benzonatate (TESSALON) 200 MG capsule Take 200 mg by mouth 3 (three) times daily as needed for cough. 03/03/24  Yes [provider]  famotidine (PEPCID) 20 MG tablet Take 1 tablet (20 mg total) by mouth 2 (two) times daily for 15 days. 05/07/23 03/11/24 Yes Sharman Cheek, MD  finasteride (PROSCAR) 5 MG tablet Take 1 tablet (5 mg total) by mouth daily. 05/18/20  Yes Wieting, Richard, MD  ipratropium (ATROVENT) 0.06 % nasal spray Place 2 sprays into the nose 3 (three) times daily as needed. 03/26/23 03/25/24 Yes [provider]  levothyroxine (SYNTHROID) 100 MCG tablet TAKE 1 TABLET BY MOUTH EVERY DAY IN THE MORNING 02/16/24  Yes Tejan-Sie, Marcelino Freestone, MD  PARoxetine (PAXIL) 20 MG tablet TAKE 1 TABLET BY MOUTH EVERY DAY 12/25/23  Yes Tejan-Sie, Marcelino Freestone, MD  rosuvastatin (CRESTOR) 10 MG tablet Take 1 tablet (10 mg total) by mouth daily. 07/28/23 07/27/24 Yes Sherron Monday, MD  spironolactone (ALDACTONE) 25 MG tablet Take 0.5 tablets (12.5 mg total) by mouth daily. 05/19/20  Yes  Wieting, Richard, MD  tamsulosin (FLOMAX) 0.4 MG CAPS capsule Take 0.8 mg by mouth at bedtime.    Yes [provider]  cariprazine (VRAYLAR) 1.5 MG capsule Take 1 capsule (1.5 mg total) by mouth daily. Patient not taking: Reported on 03/11/2024 12/11/23 03/10/24  Sherron Monday, MD  cariprazine (VRAYLAR) 1.5 MG capsule Take 1 capsule (1.5  mg total) by mouth daily for 14 days. Patient not taking: Reported on 03/11/2024 12/11/23 12/25/23  Sherron Monday, MD  naproxen (NAPROSYN) 500 MG tablet Take 500 mg by mouth 2 (two) times daily as needed for mild pain. Patient not taking: Reported on 09/25/2023 02/22/23   [provider]  ondansetron (ZOFRAN-ODT) 4 MG disintegrating tablet Take 1 tablet (4 mg total) by mouth every 8 (eight) hours as needed for nausea or vomiting. Patient not taking: Reported on 09/25/2023 05/07/23   Sharman Cheek, MD  promethazine-dextromethorphan (PROMETHAZINE-DM) 6.25-15 MG/5ML syrup Take 5 mLs by mouth every 6 (six) hours as needed for cough. Patient not taking: Reported on 09/25/2023 03/26/23   [provider]     Critical care time: 38 minutes     Raechel Chute, MD [facility] 03/21/2024 9:29 AM

## 2024-03-21 NOTE — Plan of Care (Signed)
  Problem: Education: Goal: Knowledge of General Education information will improve Description: Including pain rating scale, medication(s)/side effects and non-pharmacologic comfort measures Outcome: Not Progressing   Problem: Health Behavior/Discharge Planning: Goal: Ability to manage health-related needs will improve Outcome: Not Progressing   Problem: Clinical Measurements: Goal: Ability to maintain clinical measurements within normal limits will improve Outcome: Not Progressing Goal: Will remain free from infection Outcome: Not Progressing Goal: Diagnostic test results will improve Outcome: Not Progressing Goal: Respiratory complications will improve Outcome: Not Progressing Goal: Cardiovascular complication will be avoided Outcome: Not Progressing   Problem: Activity: Goal: Risk for activity intolerance will decrease Outcome: Not Progressing   Problem: Nutrition: Goal: Adequate nutrition will be maintained Outcome: Not Progressing   Problem: Coping: Goal: Level of anxiety will decrease Outcome: Not Progressing   Problem: Elimination: Goal: Will not experience complications related to bowel motility Outcome: Not Progressing Goal: Will not experience complications related to urinary retention Outcome: Not Progressing   Problem: Pain Managment: Goal: General experience of comfort will improve and/or be controlled Outcome: Not Progressing   Problem: Safety: Goal: Ability to remain free from injury will improve Outcome: Not Progressing   Problem: Skin Integrity: Goal: Risk for impaired skin integrity will decrease Outcome: Not Progressing   Problem: Activity: Goal: Ability to tolerate increased activity will improve Outcome: Not Progressing   Problem: Clinical Measurements: Goal: Ability to maintain a body temperature in the normal range will improve Outcome: Not Progressing   Problem: Respiratory: Goal: Ability to maintain adequate ventilation will  improve Outcome: Not Progressing Goal: Ability to maintain a clear airway will improve Outcome: Not Progressing   Problem: Activity: Goal: Ability to tolerate increased activity will improve Outcome: Not Progressing   Problem: Respiratory: Goal: Ability to maintain a clear airway and adequate ventilation will improve Outcome: Not Progressing   Problem: Role Relationship: Goal: Method of communication will improve Outcome: Not Progressing

## 2024-03-21 NOTE — Inpatient Diabetes Management (Signed)
 Inpatient Diabetes Program Recommendations  AACE/ADA: New Consensus Statement on Inpatient Glycemic Control  Target Ranges:  Prepandial:   less than 140 mg/dL      Peak postprandial:   less than 180 mg/dL (1-2 hours)      Critically ill patients:  140 - 180 mg/dL    Latest Reference Range & Units 03/20/24 07:39 03/20/24 11:25 03/20/24 15:59 03/20/24 20:02 03/20/24 23:08 03/21/24 04:15 03/21/24 07:48 03/21/24 11:47  Glucose-Capillary 70 - 99 mg/dL 161 (H) 096 (H) 045 (H) 199 (H) 245 (H) 175 (H) 218 (H) 215 (H)   Review of Glycemic Control  Current orders for Inpatient glycemic control: Novolog 0-20 units Q4H, Novolog 4 units Q4H; Solumedrol 40 mg Q24H; Vital @ 55 ml/hr   Inpatient Diabetes Program Recommendations:     Insulin: May want to consider ordering Semglee 5 units Q24H.  Thanks, Orlando Penner, RN, MSN, CDCES Diabetes Coordinator Inpatient Diabetes Program [phone] (Team Pager from 8am to 5pm)

## 2024-03-22 DIAGNOSIS — J101 Influenza due to other identified influenza virus with other respiratory manifestations: Secondary | ICD-10-CM | POA: Diagnosis not present

## 2024-03-22 DIAGNOSIS — J9601 Acute respiratory failure with hypoxia: Secondary | ICD-10-CM | POA: Diagnosis not present

## 2024-03-22 DIAGNOSIS — Z515 Encounter for palliative care: Secondary | ICD-10-CM | POA: Diagnosis not present

## 2024-03-22 DIAGNOSIS — J189 Pneumonia, unspecified organism: Secondary | ICD-10-CM | POA: Diagnosis not present

## 2024-03-22 LAB — CBC
HCT: 41.5 % (ref 39.0–52.0)
Hemoglobin: 13 g/dL (ref 13.0–17.0)
MCH: 32 pg (ref 26.0–34.0)
MCHC: 31.3 g/dL (ref 30.0–36.0)
MCV: 102.2 fL — ABNORMAL HIGH (ref 80.0–100.0)
Platelets: 372 10*3/uL (ref 150–400)
RBC: 4.06 MIL/uL — ABNORMAL LOW (ref 4.22–5.81)
RDW: 13.9 % (ref 11.5–15.5)
WBC: 37 10*3/uL — ABNORMAL HIGH (ref 4.0–10.5)
nRBC: 0 % (ref 0.0–0.2)

## 2024-03-22 LAB — RENAL FUNCTION PANEL
Albumin: 2.3 g/dL — ABNORMAL LOW (ref 3.5–5.0)
Anion gap: 10 (ref 5–15)
BUN: 176 mg/dL — ABNORMAL HIGH (ref 8–23)
CO2: 40 mmol/L — ABNORMAL HIGH (ref 22–32)
Calcium: 8.2 mg/dL — ABNORMAL LOW (ref 8.9–10.3)
Chloride: 95 mmol/L — ABNORMAL LOW (ref 98–111)
Creatinine, Ser: 1.25 mg/dL — ABNORMAL HIGH (ref 0.61–1.24)
GFR, Estimated: 57 mL/min — ABNORMAL LOW (ref >60)
Glucose, Bld: 215 mg/dL — ABNORMAL HIGH (ref 70–99)
Phosphorus: 5.1 mg/dL — ABNORMAL HIGH (ref 2.5–4.6)
Potassium: 4.5 mmol/L (ref 3.5–5.1)
Sodium: 145 mmol/L (ref 135–145)

## 2024-03-22 LAB — GLUCOSE, CAPILLARY
Glucose-Capillary: 177 mg/dL — ABNORMAL HIGH (ref 70–99)
Glucose-Capillary: 184 mg/dL — ABNORMAL HIGH (ref 70–99)
Glucose-Capillary: 210 mg/dL — ABNORMAL HIGH (ref 70–99)
Glucose-Capillary: 218 mg/dL — ABNORMAL HIGH (ref 70–99)
Glucose-Capillary: 225 mg/dL — ABNORMAL HIGH (ref 70–99)
Glucose-Capillary: 226 mg/dL — ABNORMAL HIGH (ref 70–99)
Glucose-Capillary: 235 mg/dL — ABNORMAL HIGH (ref 70–99)

## 2024-03-22 LAB — MAGNESIUM: Magnesium: 4 mg/dL — ABNORMAL HIGH (ref 1.7–2.4)

## 2024-03-22 LAB — TRIGLYCERIDES: Triglycerides: 222 mg/dL — ABNORMAL HIGH (ref <150)

## 2024-03-22 MED ORDER — VITAL AF 1.2 CAL PO LIQD
1000.0000 mL | ORAL | Status: DC
  Administered 2024-03-22 – 2024-03-24 (×2): 1000 mL

## 2024-03-22 MED ORDER — CHLORHEXIDINE GLUCONATE CLOTH 2 % EX PADS
6.0000 | MEDICATED_PAD | Freq: Every day | CUTANEOUS | Status: DC
  Administered 2024-03-23 – 2024-03-24 (×2): 6 via TOPICAL

## 2024-03-22 NOTE — Progress Notes (Signed)
 PHARMACY CONSULT NOTE - FOLLOW UP  Pharmacy Consult for Electrolyte Monitoring and Replacement   Recent Labs: Potassium (mmol/L)  Date Value  03/22/2024 4.5   Magnesium (mg/dL)  Date Value  16/09/9603 4.0 (H)   Calcium (mg/dL)  Date Value  54/08/8118 8.2 (L)   Albumin (g/dL)  Date Value  14/78/2956 2.3 (L)  12/06/2023 4.0   Phosphorus (mg/dL)  Date Value  21/30/8657 5.1 (H)   Sodium (mmol/L)  Date Value  03/22/2024 145  12/06/2023 141     Assessment: 83 y/o male with h/o BPH, hypothyroidism, PAF, depression, HLD, CAD, CHF, GERD, HTN, bladder cancer and recent flu A who is admitted with CAP and ARDS.   Nutrition: Vital HP at 55 mL/hr + free water flushes at 200 mL every 4 hours  Diuretics: furosemide 40 mg IV once daily  Goal of Therapy:  Electrolytes WNL  Plan:  ---no electrolyte replacement warranted for today ---recheck electrolytes in am  Lowella Bandy ,PharmD Clinical Pharmacist 03/22/2024 7:18 AM

## 2024-03-22 NOTE — Progress Notes (Signed)
 Nutrition Brief Follow Up Note   Propofol change   INTERVENTION:   Change to Vital 1.2@70ml /hr continuous   Free water flushes q4 hours per pharmacy   Regimen provides 2016kcal/day, 126g/day protein and 2527ml/day of free water.   Juven Fruit Punch BID via tube, each serving provides 95kcal and 2.5g of protein (amino acids glutamine and arginine)  Daily weights   Estimated Nutritional Needs:   Kcal:  2039kcal/day  Protein:  120-135g/day  Fluid:  2.0-2.3L/day  Betsey Holiday MS, RD, LDN If unable to be reached, please send secure chat to "RD inpatient" available from 8:00a-4:00p daily

## 2024-03-22 NOTE — Progress Notes (Signed)
 NAME:  Nathaniel Ibarra, MRN:  [id number], DOB:  11-Oct-1941, LOS: 12 ADMISSION DATE:  03/10/2024, CHIEF COMPLAINT:  Respiratory Failure, ARDS   History of Present Illness:   Nathaniel Ibarra is an 83 yo with a PMH significant for HTN, HLD, HFrEF, CM, CAD, Oxygen dependent 4 Liters (which he rarely uses), GERD, BPH, Depression, Hypothyroidism that presented to the ER with worsening dyspnea.  Patient previously tested positive for Influenza A with associated PNA 03/03/24 and completed a course of Tamiflu, prednisone taper, and antibiotics. Upon evaluation in the ER he was afebrile, 97% on 8 Liters, CXR low lung volumes with bronchitic changes and questionable developing right midlung zone and left mid to lower lung zone airspace opacities. He was started on antibiotics and HFNC, cultures sent. Condition worsened and a rapid response was called secondary to increased oxygen requirements and work of breath. Was placed on 100% bipap, lasix 40 mg x 1, Morphine, ABG, CXR, D-dimer sent. PCCM was consulted for medical management of patients Respiratory failure. Patient was intubated on 3/25 secondary to worsening respiratory status and worsening hypoxemia.  Pertinent  Medical History  HTN, HLD, HFrEF, CM, CAD, Oxygen dependent 4 Liters (which he rarely uses), GERD, BPH, Depression, Hypothyroidism  Significant Hospital Events: Including procedures, antibiotic start and stop dates in addition to other pertinent events   3/24: admit to [facility] for respiratory failure, HFNC to BiPAP, transfer to ICU 3/25: in ICU with worsening oxygen requirements, intubated 3/26: vented 3/28: palliative care consulted 3/31: vented, wife updated at the bedside, remains sedated 4/01: vented and sedated. Wife and son updated at the bedside. Placed in prone position overnight 4/02: back supine in AM, worsening oxygenation, family updated at the bedside. 4/03: oxygen requirements worsened overnight 4/04: remains intubated, vented,  unresponsive, poor oxygenation.  Interim History / Subjective:  Remains intubated and sedated, vented. Family at the bedside, discussed with son. Palliative care involved in care.  Objective   Blood pressure 120/64, pulse 89, temperature 98.6 F (37 C), temperature source Oral, resp. rate (!) 24, height 6' (1.829 m), weight 72.5 kg, SpO2 (!) 88%.    Vent Mode: PRVC FiO2 (%):  [65 %] 65 % Set Rate:  [24 bmp] 24 bmp Vt Set:  [500 mL] 500 mL PEEP:  [10 cmH20] 10 cmH20 Plateau Pressure:  [23 cmH20-32 cmH20] 26 cmH20   Intake/Output Summary (Last 24 hours) at 03/22/2024 1901 Last data filed at 03/22/2024 1800 Gross per 24 hour  Intake 2831.23 ml  Output 1800 ml  Net 1031.23 ml   Filed Weights   03/19/24 0346 03/20/24 0500 03/21/24 0500  Weight: 87.1 kg 88.8 kg 72.5 kg    Examination: Physical Exam Constitutional:      General: He is not in acute distress.    Appearance: He is ill-appearing.  HENT:     Mouth/Throat:     Mouth: Mucous membranes are moist.     Comments: ETT in place Cardiovascular:     Rate and Rhythm: Regular rhythm. Tachycardia present.     Pulses: Normal pulses.     Heart sounds: Normal heart sounds.  Pulmonary:     Breath sounds: Rhonchi present. No wheezing.  Skin:    Comments: Diffuse subcutaneous emphysema overlying the chest wall and extending to the abdominal wall  Neurological:     Mental Status: He is disoriented.      Assessment & Plan:   83 year old male with recent influenza infection presenting with worsening hypoxic respiratory failure requiring intubation  and mechanical ventilation.  #Acute Hypoxic Respiratory Failure #Influenza A Pneumonia #Acute Respiratory Distress Syndrome #Superimposed Bacterial Pneumonia #Subcutaneous Emphysema #Atrial Fibrillation #Toxic Metabolic Encephalopathy #HFpEF #Hypothyroidism  Neuro: Toxic metabolic encephalopathy secondary to severe pneumonia as well as sedation needs in the setting of severe  ARDS. Currently on propofol and fentanyl for analgosedation. Goal RASS -3. CV: Has a history of HFpEF, with recent echocardiogram showing elevated PASP which could be secondary to increased PVR or heart failure. Maintained on 40 mg of IV furosemide daily with overall goal net even which he is maintaining. Also on amiodarone which he takes outpatient for atrial fibrillation. Hemodynamically stable. Pulm: Acute hypoxic respiratory failure with severe ARDS secondary to influenza pneumonia and likely superimposed bacterial infection. Poor P:F ratio, without much improvement while in the prone position, with worsening oxygen requirements when back in supine position. Chest CT showed bilateral lower lobe consolidations, GGO's, and pneumomediastinum but no pneumothorax. Optimized PEEP and FiO2 to ARDS table, minimal improvement in prone position. Overall has poor prognosis and is unlikely to make any meaningful recovery from a pulmonary perspective. Wife is clear about not wanting tracheostomy tube placement. Recommended goals of care re-consideration. GI: On tube feeds, PPI for SUP Renal: kidney function appears to be within normal though his BUN is significantly elevated. Continue with gentle diuresis and attempt to avoid nephrotoxins Endo: On levothyroxine for hypothyroidism. On ICU glycemic protocol. On methylprednisolone for ARDS which we'll continue. Hem/Onc: Enoxaparin subQ for DVT prophylaxis. Does not appear to be anticoagulated outpatient. ID: Influenza A infection with possible superimposed bacterial infection resulting in severe pneumonia. He has finished a course of antibiotics on admission, with cultures not isolating any organisms. Restarted broad spectrum antibiotics given lower lobe consolidations. Cultures remained negative. Other: Continued with goals of care conversation with family. Discussed overall poor prognosis. Appreciate input from palliative care. Plan for transition to comfort measures  tomorrow.  Best Practice (right click and "Reselect all SmartList Selections" daily)   Diet/type: tubefeeds DVT prophylaxis LMWH Pressure ulcer(s): N/A GI prophylaxis: PPI Lines: N/A Foley:  Yes, and it is still needed Code Status:  DNR Last date of multidisciplinary goals of care discussion [03/22/2024]  Labs   CBC: Recent Labs  Lab 03/18/24 0305 03/19/24 0440 03/20/24 0332 03/21/24 0515 03/22/24 0419  WBC 33.1* 33.3* 28.7* 30.5* 37.0*  HGB 13.8 13.3 12.6* 12.2* 13.0  HCT 44.1 42.1 40.6 38.7* 41.5  MCV 102.1* 101.0* 102.8* 100.5* 102.2*  PLT 432* 411* 342 340 372    Basic Metabolic Panel: Recent Labs  Lab 03/17/24 0311 03/18/24 0305 03/19/24 0440 03/20/24 0332 03/21/24 0515 03/22/24 0419  NA 138 141 144 146* 146* 145  K 5.2* 5.5* 5.4* 4.8 4.7 4.5  CL 97* 97* 98 97* 96* 95*  CO2 35* 35* 38* 38* 39* 40*  GLUCOSE 241* 209* 210* 281* 195* 215*  BUN 104* 112* 126* 135* 170* 176*  CREATININE 1.00 0.94 1.03 1.08 1.23 1.25*  CALCIUM 7.9* 8.2* 8.3* 8.1* 7.9* 8.2*  MG 3.3* 3.8*  --  3.7* 3.9* 4.0*  PHOS 2.5 5.2* 4.8* 4.3 3.8 5.1*   GFR: Estimated Creatinine Clearance: 46.7 mL/min (A) (by C-G formula based on SCr of 1.25 mg/dL (H)). Recent Labs  Lab 03/18/24 1728 03/19/24 0440 03/20/24 0332 03/21/24 0515 03/22/24 0419  PROCALCITON 0.11  --   --   --   --   WBC  --  33.3* 28.7* 30.5* 37.0*    Liver Function Tests: Recent Labs  Lab 03/19/24 0440 03/20/24  7846 03/21/24 0515 03/22/24 0419  ALBUMIN 2.3* 2.1* 2.2* 2.3*   No results for input(s): "LIPASE", "AMYLASE" in the last 168 hours. No results for input(s): "AMMONIA" in the last 168 hours.  ABG    Component Value Date/Time   PHART 7.42 03/20/2024 0422   PCO2ART 76 (HH) 03/20/2024 0422   PO2ART 99 03/20/2024 0422   HCO3 49.3 (H) 03/20/2024 0422   TCO2 26 09/07/2007 1236   O2SAT 97.7 03/20/2024 0422     Coagulation Profile: No results for input(s): "INR", "PROTIME" in the last 168  hours.  Cardiac Enzymes: No results for input(s): "CKTOTAL", "CKMB", "CKMBINDEX", "TROPONINI" in the last 168 hours.  HbA1C: Hgb A1c MFr Bld  Date/Time Value Ref Range Status  07/26/2023 09:57 AM 6.1 (H) 4.8 - 5.6 % Final    Comment:             Prediabetes: 5.7 - 6.4          Diabetes: >6.4          Glycemic control for adults with diabetes: <7.0   12/30/2020 04:17 AM 5.5 4.8 - 5.6 % Final    Comment:    (NOTE) Pre diabetes:          5.7%-6.4%  Diabetes:              >6.4%  Glycemic control for   <7.0% adults with diabetes     CBG: Recent Labs  Lab 03/22/24 0055 03/22/24 0355 03/22/24 0753 03/22/24 1213 03/22/24 1714  GLUCAP 225* 218* 184* 235* 226*    Review of Systems:   Unable to obtain  Past Medical History:  He,  has a past medical history of Aortic atherosclerosis (HCC), Basal cell carcinoma (05/18/2022), Bladder cancer (HCC) (2008), BPH (benign prostatic hyperplasia), Bradycardia, Cardiomyopathy (HCC), Cataract, bilateral, Coronary artery disease, Depression, GERD (gastroesophageal reflux disease), HFrEF (heart failure with reduced ejection fraction) (HCC), History of 2019 novel coronavirus disease (COVID-19), HLD (hyperlipidemia), Hypertension, Hypothyroidism, LBBB (left bundle branch block), Long term current use of amiodarone, NSTEMI (non-ST elevated myocardial infarction) (HCC) (2016), Osteoarthritis, Paroxysmal atrial fibrillation (HCC), Peripheral edema, Pneumonia due to COVID-19 virus (12/2020), Sleep difficulties, 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   Surgical History:   Past Surgical History:  Procedure Laterality Date   BIOPSY  01/04/2024   Procedure: BIOPSY;  Surgeon: Midge Minium, MD;  Location: [facility] ENDOSCOPY;  Service: Endoscopy;;   CARDIAC CATHETERIZATION N/A 09/01/2015   Procedure: Left Heart Cath and Coronary Angiography;  Surgeon: Laurier Nancy, MD;  Location: [facility] INVASIVE CV LAB;   Service: Cardiovascular;  Laterality: N/A;   CARDIAC CATHETERIZATION N/A 09/01/2015   Procedure: Coronary Stent Intervention;  Surgeon: Alwyn Pea, MD;  Location: [facility] INVASIVE CV LAB;  Service: Cardiovascular;  Laterality: N/A;   CATARACT EXTRACTION W/PHACO Left 11/23/2016   Procedure: CATARACT EXTRACTION PHACO AND INTRAOCULAR LENS PLACEMENT (IOC);  Surgeon: Lockie Mola, MD;  Location: [facility] SURGERY CNTR;  Service: Ophthalmology;  Laterality: Left;   CATARACT EXTRACTION W/PHACO Right 02/01/2017   Procedure: CATARACT EXTRACTION PHACO AND INTRAOCULAR LENS PLACEMENT (IOC) Complicated  right toric lens;  Surgeon: Lockie Mola, MD;  Location: [facility] SURGERY [facility]TR;  Service: Ophthalmology;  Laterality: Right;  Malyugin toric Lens   COLONOSCOPY WITH PROPOFOL N/A 01/04/2024   Procedure: COLONOSCOPY WITH PROPOFOL;  Surgeon: Midge Minium, MD;  Location: [facility] ENDOSCOPY;  Service: Endoscopy;  Laterality: N/A;   KNEE ARTHROPLASTY Left 10/24/2022   Procedure: COMPUTER ASSISTED TOTAL KNEE  ARTHROPLASTY;  Surgeon: Donato Heinz, MD;  Location: [facility] ORS;  Service: Orthopedics;  Laterality: Left;   LEFT HEART CATH AND CORONARY ANGIOGRAPHY Right 11/14/2017   Procedure: LEFT HEART CATH AND CORONARY ANGIOGRAPHY;  Surgeon: Laurier Nancy, MD;  Location: [facility] INVASIVE CV LAB;  Service: Cardiovascular;  Laterality: Right;   POLYPECTOMY  01/04/2024   Procedure: POLYPECTOMY;  Surgeon: Midge Minium, MD;  Location: [facility] ENDOSCOPY;  Service: Endoscopy;;   SCROTAL EXPLORATION Left 11/24/2020   Procedure: SCROTUM EXPLORATION WITH LEFT ORCHIECTOMY;  Surgeon: Noel Christmas, MD;  Location: [facility] ORS;  Service: Urology;  Laterality: Left;   TONSILLECTOMY       Social History:   reports that he has never smoked. He has never used smokeless tobacco. He reports that he does not drink alcohol and does not use drugs.   Family History:  His family history includes Emphysema in his father and mother.    Allergies No Known Allergies   Home Medications  Prior to Admission medications   Medication Sig Start Date End Date Taking? Authorizing Provider  acetaminophen (TYLENOL) 325 MG tablet Take 2 tablets (650 mg total) by mouth every 6 (six) hours as needed for mild pain or headache (fever >/= 101). 01/13/21  Yes Lonia Blood, MD  albuterol (VENTOLIN HFA) 108 (90 Base) MCG/ACT inhaler Inhale 1 puff into the lungs every 4 (four) hours as needed. 10/27/21  Yes [provider]  amiodarone (PACERONE) 200 MG tablet Take 1 tablet (200 mg total) by mouth daily. 05/18/20  Yes Wieting, Richard, MD  benzonatate (TESSALON) 200 MG capsule Take 200 mg by mouth 3 (three) times daily as needed for cough. 03/03/24  Yes [provider]  famotidine (PEPCID) 20 MG tablet Take 1 tablet (20 mg total) by mouth 2 (two) times daily for 15 days. 05/07/23 03/11/24 Yes Sharman Cheek, MD  finasteride (PROSCAR) 5 MG tablet Take 1 tablet (5 mg total) by mouth daily. 05/18/20  Yes Wieting, Richard, MD  ipratropium (ATROVENT) 0.06 % nasal spray Place 2 sprays into the nose 3 (three) times daily as needed. 03/26/23 03/25/24 Yes [provider]  levothyroxine (SYNTHROID) 100 MCG tablet TAKE 1 TABLET BY MOUTH EVERY DAY IN THE MORNING 02/16/24  Yes Tejan-Sie, Marcelino Freestone, MD  PARoxetine (PAXIL) 20 MG tablet TAKE 1 TABLET BY MOUTH EVERY DAY 12/25/23  Yes Tejan-Sie, Marcelino Freestone, MD  rosuvastatin (CRESTOR) 10 MG tablet Take 1 tablet (10 mg total) by mouth daily. 07/28/23 07/27/24 Yes Sherron Monday, MD  spironolactone (ALDACTONE) 25 MG tablet Take 0.5 tablets (12.5 mg total) by mouth daily. 05/19/20  Yes Wieting, Richard, MD  tamsulosin (FLOMAX) 0.4 MG CAPS capsule Take 0.8 mg by mouth at bedtime.    Yes [provider]  cariprazine (VRAYLAR) 1.5 MG capsule Take 1 capsule (1.5 mg total) by mouth daily. Patient not taking: Reported on 03/11/2024 12/11/23 03/10/24  Sherron Monday, MD  cariprazine (VRAYLAR) 1.5 MG  capsule Take 1 capsule (1.5 mg total) by mouth daily for 14 days. Patient not taking: Reported on 03/11/2024 12/11/23 12/25/23  Sherron Monday, MD  naproxen (NAPROSYN) 500 MG tablet Take 500 mg by mouth 2 (two) times daily as needed for mild pain. Patient not taking: Reported on 09/25/2023 02/22/23   [provider]  ondansetron (ZOFRAN-ODT) 4 MG disintegrating tablet Take 1 tablet (4 mg total) by mouth every 8 (eight) hours as needed for nausea or vomiting. Patient not taking: Reported on 09/25/2023 05/07/23   Scotty Court,  Aneta Mins, MD  promethazine-dextromethorphan (PROMETHAZINE-DM) 6.25-15 MG/5ML syrup Take 5 mLs by mouth every 6 (six) hours as needed for cough. Patient not taking: Reported on 09/25/2023 03/26/23   [provider]     Critical care time: 34 minutes     Raechel Chute, MD [facility] 03/22/2024 7:04 PM

## 2024-03-22 NOTE — Progress Notes (Signed)
 Daily Progress Note   Patient Name: Nathaniel Ibarra       Date: 03/22/2024 DOB: [birth year]  Age: 83 y.o. MRN#: [id number] Attending Physician: Nathaniel Chute, MD Primary Care Physician: Nathaniel Monday, MD Admit Date: 03/10/2024  Reason for Consultation/Follow-up: Establishing goals of care  HPI/Brief Hospital Review: 83 y.o. male  with past medical history of HFrEF, cardiomyopathy, BPH, HTN, HLD, CAD and hypothyroidism admitted from home on 03/10/2024 with worsening dyspnea, cough and wheezing over the last week. Diagnosed with influenza and PNA about 1 week prior, completed course of Tamiflu and antibiotics.   Found to be hypoxic, admitted and being treated for acute hypoxic respiratory failure and CAP RRT called 3/25 due to worsening hypoxia-required intubation and transferred to ICU   4/4 remains intubated, sedation weaned off overnight, not following commands, oxygen requirements increased overnight    Palliative medicine was consulted for assisting with goals of care conversations.  Subjective: Extensive chart review has been completed prior to meeting patient including labs, vital signs, imaging, progress notes, orders, and available advanced directive documents from current and previous encounters.    Visited with son-Nathaniel Ibarra outside of ICU per his request. Nathaniel Ibarra shares he and his mother plan to visit again later this evening. Plan remains to transition to comfort care likely tomorrow. Answered and addressed all of sons concerns and questions, he continues to struggle with acceptance but his mother is helping him during this difficult time.  Later visited with wife at bedside, she again confirms she is considering transition to comfort care tomorrow-hopeful that her son will be at a  better place but feels as though Nathaniel Ibarra is suffering. They are not planning to visit at bedside until tomorrow-plan set to visit tomorrow.  Answered and addressed all questions and concerns. PMT to continue to follow for ongoing needs and support.  Care plan was discussed with CCM team and nursing staff.  Thank you for allowing the Palliative Medicine Team to assist in the care of this patient.  Total time:  25 minutes  Time spent includes: Detailed review of medical records (labs, imaging, vital signs), medically appropriate exam (mental status, respiratory, cardiac, skin), discussed with treatment team, counseling and educating patient, family and staff, documenting clinical information, medication management and coordination of care.  Nathaniel Deed, DNP, AGNP-C Palliative Medicine  Please contact Palliative Medicine Team phone at [phone] for questions and concerns.

## 2024-03-22 NOTE — Inpatient Diabetes Management (Signed)
 Inpatient Diabetes Program Recommendations  AACE/ADA: New Consensus Statement on Inpatient Glycemic Control  Target Ranges:  Prepandial:   less than 140 mg/dL      Peak postprandial:   less than 180 mg/dL (1-2 hours)      Critically ill patients:  140 - 180 mg/dL    Latest Reference Range & Units 03/22/24 00:55 03/22/24 03:55 03/22/24 07:53  Glucose-Capillary 70 - 99 mg/dL 952 (H) 841 (H) 324 (H)   Review of Glycemic Control  Current orders for Inpatient glycemic control: Novolog 0-20 units Q4H, Novolog 4 units Q4H; Solumedrol 40 mg Q24H; Vital @ 55 ml/hr   Inpatient Diabetes Program Recommendations:     Insulin: May want to consider ordering Semglee 5 units Q24H.   Thanks, Orlando Penner, RN, MSN, CDCES Diabetes Coordinator Inpatient Diabetes Program [phone] (Team Pager from 8am to 5pm)

## 2024-03-22 NOTE — Plan of Care (Signed)
  Problem: Clinical Measurements: Goal: Cardiovascular complication will be avoided Outcome: Progressing   Problem: Nutrition: Goal: Adequate nutrition will be maintained Outcome: Progressing   Problem: Elimination: Goal: Will not experience complications related to bowel motility Outcome: Progressing Goal: Will not experience complications related to urinary retention Outcome: Progressing   Problem: Education: Goal: Knowledge of General Education information will improve Description: Including pain rating scale, medication(s)/side effects and non-pharmacologic comfort measures Outcome: Not Progressing   Problem: Health Behavior/Discharge Planning: Goal: Ability to manage health-related needs will improve Outcome: Not Progressing

## 2024-03-23 DIAGNOSIS — J9601 Acute respiratory failure with hypoxia: Secondary | ICD-10-CM | POA: Diagnosis not present

## 2024-03-23 DIAGNOSIS — Z515 Encounter for palliative care: Secondary | ICD-10-CM | POA: Diagnosis not present

## 2024-03-23 DIAGNOSIS — J101 Influenza due to other identified influenza virus with other respiratory manifestations: Secondary | ICD-10-CM | POA: Diagnosis not present

## 2024-03-23 DIAGNOSIS — E875 Hyperkalemia: Secondary | ICD-10-CM | POA: Diagnosis not present

## 2024-03-23 LAB — GLUCOSE, CAPILLARY
Glucose-Capillary: 175 mg/dL — ABNORMAL HIGH (ref 70–99)
Glucose-Capillary: 202 mg/dL — ABNORMAL HIGH (ref 70–99)
Glucose-Capillary: 220 mg/dL — ABNORMAL HIGH (ref 70–99)
Glucose-Capillary: 223 mg/dL — ABNORMAL HIGH (ref 70–99)
Glucose-Capillary: 266 mg/dL — ABNORMAL HIGH (ref 70–99)
Glucose-Capillary: 299 mg/dL — ABNORMAL HIGH (ref 70–99)

## 2024-03-23 LAB — CBC
HCT: 37.9 % — ABNORMAL LOW (ref 39.0–52.0)
Hemoglobin: 11.8 g/dL — ABNORMAL LOW (ref 13.0–17.0)
MCH: 31.9 pg (ref 26.0–34.0)
MCHC: 31.1 g/dL (ref 30.0–36.0)
MCV: 102.4 fL — ABNORMAL HIGH (ref 80.0–100.0)
Platelets: 285 10*3/uL (ref 150–400)
RBC: 3.7 MIL/uL — ABNORMAL LOW (ref 4.22–5.81)
RDW: 13.8 % (ref 11.5–15.5)
WBC: 35.7 10*3/uL — ABNORMAL HIGH (ref 4.0–10.5)
nRBC: 0.1 % (ref 0.0–0.2)

## 2024-03-23 LAB — RENAL FUNCTION PANEL
Albumin: 2.1 g/dL — ABNORMAL LOW (ref 3.5–5.0)
Anion gap: 9 (ref 5–15)
BUN: 200 mg/dL — ABNORMAL HIGH (ref 8–23)
CO2: 41 mmol/L — ABNORMAL HIGH (ref 22–32)
Calcium: 7.7 mg/dL — ABNORMAL LOW (ref 8.9–10.3)
Chloride: 96 mmol/L — ABNORMAL LOW (ref 98–111)
Creatinine, Ser: 1.28 mg/dL — ABNORMAL HIGH (ref 0.61–1.24)
GFR, Estimated: 56 mL/min — ABNORMAL LOW (ref >60)
Glucose, Bld: 226 mg/dL — ABNORMAL HIGH (ref 70–99)
Phosphorus: 4.4 mg/dL (ref 2.5–4.6)
Potassium: 4.5 mmol/L (ref 3.5–5.1)
Sodium: 146 mmol/L — ABNORMAL HIGH (ref 135–145)

## 2024-03-23 NOTE — Progress Notes (Addendum)
 PHARMACY CONSULT NOTE - FOLLOW UP  Pharmacy Consult for Electrolyte Monitoring and Replacement   Recent Labs: Potassium (mmol/L)  Date Value  03/23/2024 4.5   Magnesium (mg/dL)  Date Value  16/09/9603 4.0 (H)   Calcium (mg/dL)  Date Value  54/08/8118 7.7 (L)   Albumin (g/dL)  Date Value  14/78/2956 2.1 (L)  12/06/2023 4.0   Phosphorus (mg/dL)  Date Value  21/30/8657 4.4   Sodium (mmol/L)  Date Value  03/23/2024 146 (H)  12/06/2023 141     Assessment: 83 y/o male with h/o BPH, hypothyroidism, PAF, depression, HLD, CAD, CHF, GERD, HTN, bladder cancer and recent flu A who is admitted with CAP and ARDS. On amio 200 mg daily. Scr is trending up.   Nutrition: Vital HP at 55 mL/hr + free water flushes at 200 mL every 4 hours  Diuretics: furosemide 40 mg IV once daily  Goal of Therapy:  Electrolytes WNL  Plan:  No replacement needed. No active electrolytes consult. Will sign off. Please re-consult if needed.   Ronnald Ramp ,PharmD Clinical Pharmacist 03/23/2024 7:40 AM

## 2024-03-23 NOTE — Progress Notes (Signed)
 NAME:  Nathaniel Ibarra, MRN:  [id number], DOB:  17-Jul-1941, LOS: 13 ADMISSION DATE:  03/10/2024, CHIEF COMPLAINT:  Respiratory Failure, ARDS   History of Present Illness:   Nathaniel Ibarra is an 83 yo with a PMH significant for HTN, HLD, HFrEF, CM, CAD, Oxygen dependent 4 Liters (which he rarely uses), GERD, BPH, Depression, Hypothyroidism that presented to the ER with worsening dyspnea.  Patient previously tested positive for Influenza A with associated PNA 03/03/24 and completed a course of Tamiflu, prednisone taper, and antibiotics. Upon evaluation in the ER he was afebrile, 97% on 8 Liters, CXR low lung volumes with bronchitic changes and questionable developing right midlung zone and left mid to lower lung zone airspace opacities. He was started on antibiotics and HFNC, cultures sent. Condition worsened and a rapid response was called secondary to increased oxygen requirements and work of breath. Was placed on 100% bipap, lasix 40 mg x 1, Morphine, ABG, CXR, D-dimer sent. PCCM was consulted for medical management of patients Respiratory failure. Patient was intubated on 3/25 secondary to worsening respiratory status and worsening hypoxemia.  Pertinent  Medical History  HTN, HLD, HFrEF, CM, CAD, Oxygen dependent 4 Liters (which he rarely uses), GERD, BPH, Depression, Hypothyroidism  Significant Hospital Events: Including procedures, antibiotic start and stop dates in addition to other pertinent events   3/24: admit to [facility] for respiratory failure, HFNC to BiPAP, transfer to ICU 3/25: in ICU with worsening oxygen requirements, intubated 3/26: vented 3/28: palliative care consulted 3/31: vented, wife updated at the bedside, remains sedated 4/01: vented and sedated. Wife and son updated at the bedside. Placed in prone position overnight 4/02: back supine in AM, worsening oxygenation, family updated at the bedside. 4/03: oxygen requirements worsened overnight 4/04: remains intubated, vented,  unresponsive, poor oxygenation. 4/05: remains vented, critically ill  Interim History / Subjective:  Intubated and sedated, unresponsive. Awaiting family.  Objective   Blood pressure (!) 127/51, pulse 79, temperature 98.6 F (37 C), temperature source Oral, resp. rate (!) 25, height 6' (1.829 m), weight 85.5 kg, SpO2 (!) 88%.    Vent Mode: PRVC FiO2 (%):  [65 %] 65 % Set Rate:  [24 bmp] 24 bmp Vt Set:  [500 mL] 500 mL PEEP:  [10 cmH20] 10 cmH20 Plateau Pressure:  [20 cmH20-26 cmH20] 20 cmH20   Intake/Output Summary (Last 24 hours) at 03/23/2024 4098 Last data filed at 03/23/2024 0800 Gross per 24 hour  Intake 2931.23 ml  Output 2114 ml  Net 817.23 ml   Filed Weights   03/20/24 0500 03/21/24 0500 03/23/24 0316  Weight: 88.8 kg 72.5 kg 85.5 kg    Examination: Physical Exam Constitutional:      General: He is not in acute distress.    Appearance: He is ill-appearing.  HENT:     Mouth/Throat:     Mouth: Mucous membranes are moist.     Comments: ETT in place Cardiovascular:     Rate and Rhythm: Regular rhythm. Tachycardia present.     Pulses: Normal pulses.     Heart sounds: Normal heart sounds.  Pulmonary:     Breath sounds: Rhonchi present. No wheezing.  Skin:    Comments: Diffuse subcutaneous emphysema overlying the chest wall and extending to the abdominal wall  Neurological:     Mental Status: He is disoriented.      Assessment & Plan:   83 year old male with recent influenza infection presenting with worsening hypoxic respiratory failure requiring intubation and mechanical ventilation.  #Acute  Hypoxic Respiratory Failure #Influenza A Pneumonia #Acute Respiratory Distress Syndrome #Superimposed Bacterial Pneumonia #Subcutaneous Emphysema #Atrial Fibrillation #Toxic Metabolic Encephalopathy #HFpEF #Hypothyroidism  Neuro: Toxic metabolic encephalopathy secondary to severe pneumonia as well as sedation needs in the setting of severe ARDS. Remains on  analgosedation with fentanyl and propofol. Goal RASS -3. CV: Has a history of HFpEF, with recent echocardiogram showing elevated PASP which could be secondary to increased PVR or heart failure. Maintained on 40 mg of IV furosemide daily with overall goal net even which he is maintaining. Also on amiodarone which he takes outpatient for atrial fibrillation. Hemodynamically stable. Pulm: Acute hypoxic respiratory failure with severe ARDS secondary to influenza pneumonia and likely superimposed bacterial infection. Poor P:F ratio, without much improvement while in the prone position, with worsening oxygen requirements when back in supine position. Chest CT showed bilateral lower lobe consolidations, GGO's, and pneumomediastinum but no pneumothorax. Optimized PEEP and FiO2 to ARDS table, minimal improvement in prone position. Overall has poor prognosis and is unlikely to make any meaningful recovery from a pulmonary perspective. Wife is clear about not wanting tracheostomy tube placement. Continue to engage family with goals of care, appreciate input from palliative care. GI: On tube feeds, PPI for SUP Renal: kidney function appears to be within normal though his BUN is significantly elevated. Continue with gentle diuresis and attempt to avoid nephrotoxins Endo: On levothyroxine for hypothyroidism. On ICU glycemic protocol. On methylprednisolone for ARDS which we'll continue. Hem/Onc: Enoxaparin subQ for DVT prophylaxis. Does not appear to be anticoagulated outpatient. ID: Influenza A infection with possible superimposed bacterial infection resulting in severe pneumonia. He has finished a course of antibiotics on admission, with cultures not isolating any organisms. Restarted broad spectrum antibiotics given lower lobe consolidations. Cultures remained negative. Other: Continued goals of care conversations with input from palliative care. Given critical illness, respiratory failure, minimal chance of meaningful  recovery recommend change goals of care to comfort measures. Will continue conversations with family.  Best Practice (right click and "Reselect all SmartList Selections" daily)   Diet/type: tubefeeds DVT prophylaxis LMWH Pressure ulcer(s): N/A GI prophylaxis: PPI Lines: N/A Foley:  Yes, and it is still needed Code Status:  DNR Last date of multidisciplinary goals of care discussion [03/23/2024]  Labs   CBC: Recent Labs  Lab 03/19/24 0440 03/20/24 0332 03/21/24 0515 03/22/24 0419 03/23/24 0403  WBC 33.3* 28.7* 30.5* 37.0* 35.7*  HGB 13.3 12.6* 12.2* 13.0 11.8*  HCT 42.1 40.6 38.7* 41.5 37.9*  MCV 101.0* 102.8* 100.5* 102.2* 102.4*  PLT 411* 342 340 372 285    Basic Metabolic Panel: Recent Labs  Lab 03/17/24 0311 03/18/24 0305 03/19/24 0440 03/20/24 0332 03/21/24 0515 03/22/24 0419 03/23/24 0403  NA 138 141 144 146* 146* 145 146*  K 5.2* 5.5* 5.4* 4.8 4.7 4.5 4.5  CL 97* 97* 98 97* 96* 95* 96*  CO2 35* 35* 38* 38* 39* 40* 41*  GLUCOSE 241* 209* 210* 281* 195* 215* 226*  BUN 104* 112* 126* 135* 170* 176* 200*  CREATININE 1.00 0.94 1.03 1.08 1.23 1.25* 1.28*  CALCIUM 7.9* 8.2* 8.3* 8.1* 7.9* 8.2* 7.7*  MG 3.3* 3.8*  --  3.7* 3.9* 4.0*  --   PHOS 2.5 5.2* 4.8* 4.3 3.8 5.1* 4.4   GFR: Estimated Creatinine Clearance: 48.8 mL/min (A) (by C-G formula based on SCr of 1.28 mg/dL (H)). Recent Labs  Lab 03/18/24 1728 03/19/24 0440 03/20/24 0332 03/21/24 0515 03/22/24 0419 03/23/24 0403  PROCALCITON 0.11  --   --   --   --   --  WBC  --    < > 28.7* 30.5* 37.0* 35.7*   < > = values in this interval not displayed.    Liver Function Tests: Recent Labs  Lab 03/19/24 0440 03/20/24 0332 03/21/24 0515 03/22/24 0419 03/23/24 0403  ALBUMIN 2.3* 2.1* 2.2* 2.3* 2.1*   No results for input(s): "LIPASE", "AMYLASE" in the last 168 hours. No results for input(s): "AMMONIA" in the last 168 hours.  ABG    Component Value Date/Time   PHART 7.42 03/20/2024 0422    PCO2ART 76 (HH) 03/20/2024 0422   PO2ART 99 03/20/2024 0422   HCO3 49.3 (H) 03/20/2024 0422   TCO2 26 09/07/2007 1236   O2SAT 97.7 03/20/2024 0422     Coagulation Profile: No results for input(s): "INR", "PROTIME" in the last 168 hours.  Cardiac Enzymes: No results for input(s): "CKTOTAL", "CKMB", "CKMBINDEX", "TROPONINI" in the last 168 hours.  HbA1C: Hgb A1c MFr Bld  Date/Time Value Ref Range Status  07/26/2023 09:57 AM 6.1 (H) 4.8 - 5.6 % Final    Comment:             Prediabetes: 5.7 - 6.4          Diabetes: >6.4          Glycemic control for adults with diabetes: <7.0   12/30/2020 04:17 AM 5.5 4.8 - 5.6 % Final    Comment:    (NOTE) Pre diabetes:          5.7%-6.4%  Diabetes:              >6.4%  Glycemic control for   <7.0% adults with diabetes     CBG: Recent Labs  Lab 03/22/24 1714 03/22/24 1931 03/22/24 2315 03/23/24 0347 03/23/24 0818  GLUCAP 226* 210* 177* 175* 266*    Review of Systems:   Unable to obtain  Past Medical History:  He,  has a past medical history of Aortic atherosclerosis (HCC), Basal cell carcinoma (05/18/2022), Bladder cancer (HCC) (2008), BPH (benign prostatic hyperplasia), Bradycardia, Cardiomyopathy (HCC), Cataract, bilateral, Coronary artery disease, Depression, GERD (gastroesophageal reflux disease), HFrEF (heart failure with reduced ejection fraction) (HCC), History of 2019 novel coronavirus disease (COVID-19), HLD (hyperlipidemia), Hypertension, Hypothyroidism, LBBB (left bundle branch block), Long term current use of amiodarone, NSTEMI (non-ST elevated myocardial infarction) (HCC) (2016), Osteoarthritis, Paroxysmal atrial fibrillation (HCC), Peripheral edema, Pneumonia due to COVID-19 virus (12/2020), Sleep difficulties, 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   Surgical History:   Past Surgical History:  Procedure Laterality Date   BIOPSY  01/04/2024   Procedure: BIOPSY;   Surgeon: Midge Minium, MD;  Location: [facility] ENDOSCOPY;  Service: Endoscopy;;   CARDIAC CATHETERIZATION N/A 09/01/2015   Procedure: Left Heart Cath and Coronary Angiography;  Surgeon: Laurier Nancy, MD;  Location: [facility] INVASIVE CV LAB;  Service: Cardiovascular;  Laterality: N/A;   CARDIAC CATHETERIZATION N/A 09/01/2015   Procedure: Coronary Stent Intervention;  Surgeon: Alwyn Pea, MD;  Location: [facility] INVASIVE CV LAB;  Service: Cardiovascular;  Laterality: N/A;   CATARACT EXTRACTION W/PHACO Left 11/23/2016   Procedure: CATARACT EXTRACTION PHACO AND INTRAOCULAR LENS PLACEMENT (IOC);  Surgeon: Lockie Mola, MD;  Location: [facility] SURGERY CNTR;  Service: Ophthalmology;  Laterality: Left;   CATARACT EXTRACTION W/PHACO Right 02/01/2017   Procedure: CATARACT EXTRACTION PHACO AND INTRAOCULAR LENS PLACEMENT (IOC) Complicated  right toric lens;  Surgeon: Lockie Mola, MD;  Location: [facility] SURGERY [facility];  Service: Ophthalmology;  Laterality: Right;  Malyugin toric Lens  COLONOSCOPY WITH PROPOFOL N/A 01/04/2024   Procedure: COLONOSCOPY WITH PROPOFOL;  Surgeon: Midge Minium, MD;  Location: [facility] ENDOSCOPY;  Service: Endoscopy;  Laterality: N/A;   KNEE ARTHROPLASTY Left 10/24/2022   Procedure: COMPUTER ASSISTED TOTAL KNEE ARTHROPLASTY;  Surgeon: Donato Heinz, MD;  Location: [facility] ORS;  Service: Orthopedics;  Laterality: Left;   LEFT HEART CATH AND CORONARY ANGIOGRAPHY Right 11/14/2017   Procedure: LEFT HEART CATH AND CORONARY ANGIOGRAPHY;  Surgeon: Laurier Nancy, MD;  Location: [facility] INVASIVE CV LAB;  Service: Cardiovascular;  Laterality: Right;   POLYPECTOMY  01/04/2024   Procedure: POLYPECTOMY;  Surgeon: Midge Minium, MD;  Location: [facility] ENDOSCOPY;  Service: Endoscopy;;   SCROTAL EXPLORATION Left 11/24/2020   Procedure: SCROTUM EXPLORATION WITH LEFT ORCHIECTOMY;  Surgeon: Noel Christmas, MD;  Location: [facility] ORS;  Service: Urology;  Laterality: Left;   TONSILLECTOMY       Social  History:   reports that he has never smoked. He has never used smokeless tobacco. He reports that he does not drink alcohol and does not use drugs.   Family History:  His family history includes Emphysema in his father and mother.   Allergies No Known Allergies   Home Medications  Prior to Admission medications   Medication Sig Start Date End Date Taking? Authorizing Provider  acetaminophen (TYLENOL) 325 MG tablet Take 2 tablets (650 mg total) by mouth every 6 (six) hours as needed for mild pain or headache (fever >/= 101). 01/13/21  Yes Lonia Blood, MD  albuterol (VENTOLIN HFA) 108 (90 Base) MCG/ACT inhaler Inhale 1 puff into the lungs every 4 (four) hours as needed. 10/27/21  Yes [provider]  amiodarone (PACERONE) 200 MG tablet Take 1 tablet (200 mg total) by mouth daily. 05/18/20  Yes Wieting, Richard, MD  benzonatate (TESSALON) 200 MG capsule Take 200 mg by mouth 3 (three) times daily as needed for cough. 03/03/24  Yes [provider]  famotidine (PEPCID) 20 MG tablet Take 1 tablet (20 mg total) by mouth 2 (two) times daily for 15 days. 05/07/23 03/11/24 Yes Sharman Cheek, MD  finasteride (PROSCAR) 5 MG tablet Take 1 tablet (5 mg total) by mouth daily. 05/18/20  Yes Wieting, Richard, MD  ipratropium (ATROVENT) 0.06 % nasal spray Place 2 sprays into the nose 3 (three) times daily as needed. 03/26/23 03/25/24 Yes [provider]  levothyroxine (SYNTHROID) 100 MCG tablet TAKE 1 TABLET BY MOUTH EVERY DAY IN THE MORNING 02/16/24  Yes Tejan-Sie, Marcelino Freestone, MD  PARoxetine (PAXIL) 20 MG tablet TAKE 1 TABLET BY MOUTH EVERY DAY 12/25/23  Yes Tejan-Sie, Marcelino Freestone, MD  rosuvastatin (CRESTOR) 10 MG tablet Take 1 tablet (10 mg total) by mouth daily. 07/28/23 07/27/24 Yes Sherron Monday, MD  spironolactone (ALDACTONE) 25 MG tablet Take 0.5 tablets (12.5 mg total) by mouth daily. 05/19/20  Yes Wieting, Richard, MD  tamsulosin (FLOMAX) 0.4 MG CAPS capsule Take 0.8 mg by mouth at  bedtime.    Yes [provider]  cariprazine (VRAYLAR) 1.5 MG capsule Take 1 capsule (1.5 mg total) by mouth daily. Patient not taking: Reported on 03/11/2024 12/11/23 03/10/24  Sherron Monday, MD  cariprazine (VRAYLAR) 1.5 MG capsule Take 1 capsule (1.5 mg total) by mouth daily for 14 days. Patient not taking: Reported on 03/11/2024 12/11/23 12/25/23  Sherron Monday, MD  naproxen (NAPROSYN) 500 MG tablet Take 500 mg by mouth 2 (two) times daily as needed for mild pain. Patient not taking: Reported on 09/25/2023 02/22/23  [provider]  ondansetron (ZOFRAN-ODT) 4 MG disintegrating tablet Take 1 tablet (4 mg total) by mouth every 8 (eight) hours as needed for nausea or vomiting. Patient not taking: Reported on 09/25/2023 05/07/23   Sharman Cheek, MD  promethazine-dextromethorphan (PROMETHAZINE-DM) 6.25-15 MG/5ML syrup Take 5 mLs by mouth every 6 (six) hours as needed for cough. Patient not taking: Reported on 09/25/2023 03/26/23   [provider]     Critical care time: 31 minutes    Raechel Chute, MD [facility] 03/23/2024 9:26 AM

## 2024-03-23 NOTE — Plan of Care (Signed)
  Problem: Clinical Measurements: Goal: Cardiovascular complication will be avoided Outcome: Progressing   Problem: Nutrition: Goal: Adequate nutrition will be maintained Outcome: Progressing   Problem: Elimination: Goal: Will not experience complications related to bowel motility Outcome: Progressing Goal: Will not experience complications related to urinary retention Outcome: Progressing   Problem: Education: Goal: Knowledge of General Education information will improve Description: Including pain rating scale, medication(s)/side effects and non-pharmacologic comfort measures Outcome: Not Progressing   Problem: Health Behavior/Discharge Planning: Goal: Ability to manage health-related needs will improve Outcome: Not Progressing   Problem: Clinical Measurements: Goal: Respiratory complications will improve Outcome: Not Progressing   Problem: Pain Managment: Goal: General experience of comfort will improve and/or be controlled Outcome: Not Progressing   Problem: Role Relationship: Goal: Method of communication will improve Outcome: Not Progressing

## 2024-03-23 NOTE — Plan of Care (Signed)
  Problem: Education: Goal: Knowledge of General Education information will improve Description: Including pain rating scale, medication(s)/side effects and non-pharmacologic comfort measures Outcome: Not Progressing   Problem: Health Behavior/Discharge Planning: Goal: Ability to manage health-related needs will improve Outcome: Not Progressing   Problem: Clinical Measurements: Goal: Ability to maintain clinical measurements within normal limits will improve Outcome: Not Progressing Goal: Will remain free from infection Outcome: Not Progressing Goal: Diagnostic test results will improve Outcome: Not Progressing Goal: Respiratory complications will improve Outcome: Not Progressing Goal: Cardiovascular complication will be avoided Outcome: Not Progressing   Problem: Respiratory: Goal: Ability to maintain a clear airway and adequate ventilation will improve Outcome: Not Progressing

## 2024-03-23 NOTE — Progress Notes (Signed)
 Daily Progress Note   Patient Name: Nathaniel Ibarra       Date: 03/23/2024 DOB: October 19, 1941  Age: 83 y.o. MRN#: [id number] Attending Physician: Nathaniel Chute, MD Primary Care Physician: Nathaniel Monday, MD Admit Date: 03/10/2024  Reason for Consultation/Follow-up: Establishing goals of care  HPI/Brief Hospital Review: 83 y.o. male  with past medical history of HFrEF, cardiomyopathy, BPH, HTN, HLD, CAD and hypothyroidism admitted from home on 03/10/2024 with worsening dyspnea, cough and wheezing over the last week. Diagnosed with influenza and PNA about 1 week prior, completed course of Tamiflu and antibiotics.   Found to be hypoxic, admitted and being treated for acute hypoxic respiratory failure and CAP RRT called 3/25 due to worsening hypoxia-required intubation and transferred to ICU   4/4 remains intubated, sedation weaned off overnight, not following commands, oxygen requirements increased overnight  4/5 remains intubated and off sedation, unresponsive, respiratory status remains tenuous   Palliative medicine was consulted for assisting with goals of care conversations.  Subjective: Extensive chart review has been completed prior to meeting patient including labs, vital signs, imaging, progress notes, orders, and available advanced directive documents from current and previous encounters.    Met with son and wife separately throughout the day.  Reviewed medical updates and clinical course.  In depth discussions were had surrounding pain versus suffering, at this time Nathaniel Ibarra likely experiencing a degree of suffering, does not have obvious or apparent signs of pain or discomfort. We discussed upholding dignity and peace and end of life.  Again, comfort measures were addressed  with both, comfort medications to be initiated prior to liberation from ventilator, all care would be focused on relieving suffering and providing comfort acknowledging Nathaniel Ibarra's time would be limited, anticipating in hospital death.  Family wishing to wait until tomorrow, time set for 1pm 4/6 with anticipation to transition to comfort care  Care plan was discussed with CCM team and nursing staff  Thank you for allowing the Palliative Medicine Team to assist in the care of this patient.  Total time:  50 minutes  Time spent includes: Detailed review of medical records (labs, imaging, vital signs), medically appropriate exam (mental status, respiratory, cardiac, skin), discussed with treatment team, counseling and educating patient, family and staff, documenting clinical information, medication management and coordination of care.  Nathaniel Deed,  DNP, AGNP-C Palliative Medicine   Please contact Palliative Medicine Team phone at [phone] for questions and concerns.

## 2024-03-24 DIAGNOSIS — J101 Influenza due to other identified influenza virus with other respiratory manifestations: Secondary | ICD-10-CM | POA: Diagnosis not present

## 2024-03-24 DIAGNOSIS — Z515 Encounter for palliative care: Secondary | ICD-10-CM | POA: Diagnosis not present

## 2024-03-24 DIAGNOSIS — E875 Hyperkalemia: Secondary | ICD-10-CM | POA: Diagnosis not present

## 2024-03-24 DIAGNOSIS — J9601 Acute respiratory failure with hypoxia: Secondary | ICD-10-CM | POA: Diagnosis not present

## 2024-03-24 LAB — GLUCOSE, CAPILLARY
Glucose-Capillary: 228 mg/dL — ABNORMAL HIGH (ref 70–99)
Glucose-Capillary: 195 mg/dL — ABNORMAL HIGH (ref 70–99)

## 2024-03-24 MED ORDER — FENTANYL CITRATE PF 50 MCG/ML IJ SOSY: 25.0000 ug | PREFILLED_SYRINGE | INTRAMUSCULAR | Status: DC | PRN

## 2024-03-24 MED ORDER — MIDAZOLAM HCL 2 MG/2ML IJ SOLN: 2.0000 mg | INTRAMUSCULAR | Status: DC | PRN

## 2024-03-31 ENCOUNTER — Other Ambulatory Visit: Payer: Self-pay | Admitting: Internal Medicine

## 2024-03-31 DIAGNOSIS — F3289 Other specified depressive episodes: Secondary | ICD-10-CM

## 2024-04-05 ENCOUNTER — Ambulatory Visit: Payer: PPO | Admitting: Internal Medicine

## 2024-04-18 NOTE — Progress Notes (Signed)
 Patient with worsening hypotension and change in rhythm. Family updated via phone. Family to come to bedside.    Patient expired at 0914. Family at bedside. No belongings at bedside.

## 2024-04-18 NOTE — Plan of Care (Signed)
  Problem: Education: Goal: Knowledge of General Education information will improve Description: Including pain rating scale, medication(s)/side effects and non-pharmacologic comfort measures Outcome: Not Progressing   Problem: Health Behavior/Discharge Planning: Goal: Ability to manage health-related needs will improve Outcome: Not Progressing   Problem: Clinical Measurements: Goal: Ability to maintain clinical measurements within normal limits will improve Outcome: Not Progressing Goal: Will remain free from infection Outcome: Not Progressing Goal: Diagnostic test results will improve Outcome: Not Progressing Goal: Respiratory complications will improve Outcome: Not Progressing Goal: Cardiovascular complication will be avoided Outcome: Not Progressing   Problem: Activity: Goal: Risk for activity intolerance will decrease Outcome: Not Progressing   Problem: Nutrition: Goal: Adequate nutrition will be maintained Outcome: Not Progressing   Problem: Pain Managment: Goal: General experience of comfort will improve and/or be controlled Outcome: Not Progressing

## 2024-04-18 NOTE — Progress Notes (Signed)
 Chaplain On-Call responded to a page from the ICU, with the report of patient at end of life and family at the bedside.  Chaplain arrived and met patient's wife and son.  They declined Chaplain visit.  Chaplain Morene Crocker., [facility]

## 2024-04-18 NOTE — Progress Notes (Signed)
 NAME:  Nathaniel Ibarra, MRN:  [id number], DOB:  07-Nov-1941, LOS: 14 ADMISSION DATE:  03/10/2024, CHIEF COMPLAINT:  Respiratory Failure, ARDS   History of Present Illness:   Nathaniel Ibarra is an 83 yo with a PMH significant for HTN, HLD, HFrEF, CM, CAD, Oxygen dependent 4 Liters (which he rarely uses), GERD, BPH, Depression, Hypothyroidism that presented to the ER with worsening dyspnea.  Patient previously tested positive for Influenza A with associated PNA 03/03/24 and completed a course of Tamiflu, prednisone taper, and antibiotics. Upon evaluation in the ER he was afebrile, 97% on 8 Liters, CXR low lung volumes with bronchitic changes and questionable developing right midlung zone and left mid to lower lung zone airspace opacities. He was started on antibiotics and HFNC, cultures sent. Condition worsened and a rapid response was called secondary to increased oxygen requirements and work of breath. Was placed on 100% bipap, lasix 40 mg x 1, Morphine, ABG, CXR, D-dimer sent. PCCM was consulted for medical management of patients Respiratory failure. Patient was intubated on 3/25 secondary to worsening respiratory status and worsening hypoxemia.  Pertinent  Medical History  HTN, HLD, HFrEF, CM, CAD, Oxygen dependent 4 Liters (which he rarely uses), GERD, BPH, Depression, Hypothyroidism  Significant Hospital Events: Including procedures, antibiotic start and stop dates in addition to other pertinent events   3/24: admit to [facility] for respiratory failure, HFNC to BiPAP, transfer to ICU 3/25: in ICU with worsening oxygen requirements, intubated 3/26: vented 3/28: palliative care consulted 3/31: vented, wife updated at the bedside, remains sedated 4/01: vented and sedated. Wife and son updated at the bedside. Placed in prone position overnight 4/02: back supine in AM, worsening oxygenation, family updated at the bedside. 4/03: oxygen requirements worsened overnight 4/04: remains intubated, vented,  unresponsive, poor oxygenation. 4/05: remains vented, critically ill 4/06: vented, hypotensive, family at bedside  Interim History / Subjective:  Intubated, off sedation, hypotensive, family at bedside.  Objective   Blood pressure (!) 56/39, pulse 70, temperature (!) 100.9 F (38.3 C), temperature source Axillary, resp. rate (!) 22, height 6' 0.01" (1.829 m), weight 85.5 kg, SpO2 (!) 87%.    Vent Mode: PRVC FiO2 (%):  [65 %] 65 % Set Rate:  [24 bmp] 24 bmp Vt Set:  [500 mL] 500 mL PEEP:  [10 cmH20] 10 cmH20 Plateau Pressure:  [22 cmH20-28 cmH20] 22 cmH20   Intake/Output Summary (Last 24 hours) at 03/23/2024 9562 Last data filed at 04/17/2024 0700 Gross per 24 hour  Intake 1716.79 ml  Output 1200 ml  Net 516.79 ml   Filed Weights   03/21/24 0500 03/23/24 0316 04/06/2024 0431  Weight: 72.5 kg 85.5 kg 85.5 kg    Examination: Physical Exam Constitutional:      General: He is not in acute distress.    Appearance: He is ill-appearing.  HENT:     Mouth/Throat:     Mouth: Mucous membranes are moist.     Comments: ETT in place Cardiovascular:     Rate and Rhythm: Regular rhythm. Tachycardia present.     Pulses: Normal pulses.     Heart sounds: Normal heart sounds.  Pulmonary:     Breath sounds: Rhonchi present. No wheezing.  Skin:    Comments: Diffuse subcutaneous emphysema overlying the chest wall and extending to the abdominal wall  Neurological:     Mental Status: He is disoriented.      Assessment & Plan:   83 year old male with recent influenza infection presenting with worsening hypoxic  respiratory failure requiring intubation and mechanical ventilation.  #Acute Hypoxic Respiratory Failure #Influenza A Pneumonia #Acute Respiratory Distress Syndrome #Superimposed Bacterial Pneumonia #Subcutaneous Emphysema #Atrial Fibrillation #Toxic Metabolic Encephalopathy #HFpEF #Hypothyroidism  Neuro: Toxic metabolic encephalopathy secondary to severe pneumonia as well as  sedation needs in the setting of severe ARDS. Remains on analgosedation with fentanyl and propofol. Goal RASS -3.  > comfort measures only CV: Has a history of HFpEF, with recent echocardiogram showing elevated PASP which could be secondary to increased PVR or heart failure. Maintained on 40 mg of IV furosemide daily with overall goal net even which he is maintaining. Also on amiodarone which he takes outpatient for atrial fibrillation.  > hypotensive and bradycardic, in the dying process  > comfort measures only Pulm: Acute hypoxic respiratory failure with severe ARDS secondary to influenza pneumonia and likely superimposed bacterial infection. Poor P:F ratio, without much improvement while in the prone position, with worsening oxygen requirements when back in supine position. Chest CT showed bilateral lower lobe consolidations, GGO's, and pneumomediastinum but no pneumothorax. Optimized PEEP and FiO2 to ARDS table, minimal improvement in prone position. Overall has poor prognosis and is unlikely to make any meaningful recovery from a pulmonary perspective. Wife is clear about not wanting tracheostomy tube placement.  > comfort measures only GI: Comfort measures only Renal: kidney function appears to be within normal though his BUN is significantly elevated.  > comfort measures only Endo: On levothyroxine for hypothyroidism. On ICU glycemic protocol. On methylprednisolone for ARDS.  > comfort measures only Hem/Onc: Enoxaparin subQ for DVT prophylaxis. Does not appear to be anticoagulated outpatient. ID: Influenza A infection with possible superimposed bacterial infection resulting in severe pneumonia. He has finished a course of antibiotics on admission, with cultures not isolating any organisms. Restarted broad spectrum antibiotics given lower lobe consolidations. Cultures remained negative. Other: Discussed that he is in the dying process with family at the bedside. Comfort measures only.  Best  Practice (right click and "Reselect all SmartList Selections" daily)   Diet/type: tubefeeds DVT prophylaxis LMWH Pressure ulcer(s): N/A GI prophylaxis: PPI Lines: N/A Foley:  Yes, and it is still needed Code Status:  DNR Last date of multidisciplinary goals of care discussion [04/08/2024]  Labs   CBC: Recent Labs  Lab 03/19/24 0440 03/20/24 0332 03/21/24 0515 03/22/24 0419 03/23/24 0403  WBC 33.3* 28.7* 30.5* 37.0* 35.7*  HGB 13.3 12.6* 12.2* 13.0 11.8*  HCT 42.1 40.6 38.7* 41.5 37.9*  MCV 101.0* 102.8* 100.5* 102.2* 102.4*  PLT 411* 342 340 372 285    Basic Metabolic Panel: Recent Labs  Lab 03/18/24 0305 03/19/24 0440 03/20/24 0332 03/21/24 0515 03/22/24 0419 03/23/24 0403  NA 141 144 146* 146* 145 146*  K 5.5* 5.4* 4.8 4.7 4.5 4.5  CL 97* 98 97* 96* 95* 96*  CO2 35* 38* 38* 39* 40* 41*  GLUCOSE 209* 210* 281* 195* 215* 226*  BUN 112* 126* 135* 170* 176* 200*  CREATININE 0.94 1.03 1.08 1.23 1.25* 1.28*  CALCIUM 8.2* 8.3* 8.1* 7.9* 8.2* 7.7*  MG 3.8*  --  3.7* 3.9* 4.0*  --   PHOS 5.2* 4.8* 4.3 3.8 5.1* 4.4   GFR: Estimated Creatinine Clearance: 48.8 mL/min (A) (by C-G formula based on SCr of 1.28 mg/dL (H)). Recent Labs  Lab 03/18/24 1728 03/19/24 0440 03/20/24 0332 03/21/24 0515 03/22/24 0419 03/23/24 0403  PROCALCITON 0.11  --   --   --   --   --   WBC  --    < >  28.7* 30.5* 37.0* 35.7*   < > = values in this interval not displayed.    Liver Function Tests: Recent Labs  Lab 03/19/24 0440 03/20/24 0332 03/21/24 0515 03/22/24 0419 03/23/24 0403  ALBUMIN 2.3* 2.1* 2.2* 2.3* 2.1*   No results for input(s): "LIPASE", "AMYLASE" in the last 168 hours. No results for input(s): "AMMONIA" in the last 168 hours.  ABG    Component Value Date/Time   PHART 7.42 03/20/2024 0422   PCO2ART 76 (HH) 03/20/2024 0422   PO2ART 99 03/20/2024 0422   HCO3 49.3 (H) 03/20/2024 0422   TCO2 26 09/07/2007 1236   O2SAT 97.7 03/20/2024 0422     Coagulation  Profile: No results for input(s): "INR", "PROTIME" in the last 168 hours.  Cardiac Enzymes: No results for input(s): "CKTOTAL", "CKMB", "CKMBINDEX", "TROPONINI" in the last 168 hours.  HbA1C: Hgb A1c MFr Bld  Date/Time Value Ref Range Status  07/26/2023 09:57 AM 6.1 (H) 4.8 - 5.6 % Final    Comment:             Prediabetes: 5.7 - 6.4          Diabetes: >6.4          Glycemic control for adults with diabetes: <7.0   12/30/2020 04:17 AM 5.5 4.8 - 5.6 % Final    Comment:    (NOTE) Pre diabetes:          5.7%-6.4%  Diabetes:              >6.4%  Glycemic control for   <7.0% adults with diabetes     CBG: Recent Labs  Lab 03/23/24 1604 03/23/24 1947 03/23/24 2336 04/17/2024 0340 04/05/2024 0757  GLUCAP 223* 202* 220* 228* 195*    Review of Systems:   Unable to obtain  Past Medical History:  He,  has a past medical history of Aortic atherosclerosis (HCC), Basal cell carcinoma (05/18/2022), Bladder cancer (HCC) (2008), BPH (benign prostatic hyperplasia), Bradycardia, Cardiomyopathy (HCC), Cataract, bilateral, Coronary artery disease, Depression, GERD (gastroesophageal reflux disease), HFrEF (heart failure with reduced ejection fraction) (HCC), History of 2019 novel coronavirus disease (COVID-19), HLD (hyperlipidemia), Hypertension, Hypothyroidism, LBBB (left bundle branch block), Long term current use of amiodarone, NSTEMI (non-ST elevated myocardial infarction) (HCC) (2016), Osteoarthritis, Paroxysmal atrial fibrillation (HCC), Peripheral edema, Pneumonia due to COVID-19 virus (12/2020), Sleep difficulties, 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   Surgical History:   Past Surgical History:  Procedure Laterality Date   BIOPSY  01/04/2024   Procedure: BIOPSY;  Surgeon: Midge Minium, MD;  Location: [facility] ENDOSCOPY;  Service: Endoscopy;;   CARDIAC CATHETERIZATION N/A 09/01/2015   Procedure: Left Heart Cath and Coronary  Angiography;  Surgeon: Laurier Nancy, MD;  Location[facility] INVASIVE CV LAB;  Service: Cardiovascular;  Laterality: N/A;   CARDIAC CATHETERIZATION N/A 09/01/2015   Procedure: Coronary Stent Intervention;  Surgeon: Alwyn Pea, MD;  Location: [facility] INVASIVE CV LAB;  Service: Cardiovascular;  Laterality: N/A;   CATARACT EXTRACTION W/PHACO Left 11/23/2016   Procedure: CATARACT EXTRACTION PHACO AND INTRAOCULAR LENS PLACEMENT (IOC);  Surgeon: Lockie Mola, MD;  Location: [facility] SURGERY CNTR;  Service: Ophthalmology;  Laterality: Left;   CATARACT EXTRACTION W/PHACO Right 02/01/2017   Procedure: CATARACT EXTRACTION PHACO AND INTRAOCULAR LENS PLACEMENT (IOC) Complicated  right toric lens;  Surgeon: Lockie Mola, MD;  Location: [facility] SURGERY [facility];  Service: Ophthalmology;  Laterality: Right;  Malyugin toric Lens   COLONOSCOPY WITH PROPOFOL N/A 01/04/2024   Procedure:  COLONOSCOPY WITH PROPOFOL;  Surgeon: Midge Minium, MD;  Location: [facility] ENDOSCOPY;  Service: Endoscopy;  Laterality: N/A;   KNEE ARTHROPLASTY Left 10/24/2022   Procedure: COMPUTER ASSISTED TOTAL KNEE ARTHROPLASTY;  Surgeon: Donato Heinz, MD;  Location: [facility] ORS;  Service: Orthopedics;  Laterality: Left;   LEFT HEART CATH AND CORONARY ANGIOGRAPHY Right 11/14/2017   Procedure: LEFT HEART CATH AND CORONARY ANGIOGRAPHY;  Surgeon: Laurier Nancy, MD;  Location: [facility] INVASIVE CV LAB;  Service: Cardiovascular;  Laterality: Right;   POLYPECTOMY  01/04/2024   Procedure: POLYPECTOMY;  Surgeon: Midge Minium, MD;  Location: [facility] ENDOSCOPY;  Service: Endoscopy;;   SCROTAL EXPLORATION Left 11/24/2020   Procedure: SCROTUM EXPLORATION WITH LEFT ORCHIECTOMY;  Surgeon: Noel Christmas, MD;  Location: [facility] ORS;  Service: Urology;  Laterality: Left;   TONSILLECTOMY       Social History:   reports that he has never smoked. He has never used smokeless tobacco. He reports that he does not drink alcohol and does not use drugs.   Family  History:  His family history includes Emphysema in his father and mother.   Allergies No Known Allergies   Home Medications  Prior to Admission medications   Medication Sig Start Date End Date Taking? Authorizing Provider  acetaminophen (TYLENOL) 325 MG tablet Take 2 tablets (650 mg total) by mouth every 6 (six) hours as needed for mild pain or headache (fever >/= 101). 01/13/21  Yes Lonia Blood, MD  albuterol (VENTOLIN HFA) 108 (90 Base) MCG/ACT inhaler Inhale 1 puff into the lungs every 4 (four) hours as needed. 10/27/21  Yes [provider]  amiodarone (PACERONE) 200 MG tablet Take 1 tablet (200 mg total) by mouth daily. 05/18/20  Yes Wieting, Richard, MD  benzonatate (TESSALON) 200 MG capsule Take 200 mg by mouth 3 (three) times daily as needed for cough. 03/03/24  Yes [provider]  famotidine (PEPCID) 20 MG tablet Take 1 tablet (20 mg total) by mouth 2 (two) times daily for 15 days. 05/07/23 03/11/24 Yes Sharman Cheek, MD  finasteride (PROSCAR) 5 MG tablet Take 1 tablet (5 mg total) by mouth daily. 05/18/20  Yes Wieting, Richard, MD  ipratropium (ATROVENT) 0.06 % nasal spray Place 2 sprays into the nose 3 (three) times daily as needed. 03/26/23 03/25/24 Yes [provider]  levothyroxine (SYNTHROID) 100 MCG tablet TAKE 1 TABLET BY MOUTH EVERY DAY IN THE MORNING 02/16/24  Yes Tejan-Sie, Marcelino Freestone, MD  PARoxetine (PAXIL) 20 MG tablet TAKE 1 TABLET BY MOUTH EVERY DAY 12/25/23  Yes Tejan-Sie, Marcelino Freestone, MD  rosuvastatin (CRESTOR) 10 MG tablet Take 1 tablet (10 mg total) by mouth daily. 07/28/23 07/27/24 Yes Sherron Monday, MD  spironolactone (ALDACTONE) 25 MG tablet Take 0.5 tablets (12.5 mg total) by mouth daily. 05/19/20  Yes Wieting, Richard, MD  tamsulosin (FLOMAX) 0.4 MG CAPS capsule Take 0.8 mg by mouth at bedtime.    Yes [provider]  cariprazine (VRAYLAR) 1.5 MG capsule Take 1 capsule (1.5 mg total) by mouth daily. Patient not taking: Reported on  03/11/2024 12/11/23 03/10/24  Sherron Monday, MD  cariprazine (VRAYLAR) 1.5 MG capsule Take 1 capsule (1.5 mg total) by mouth daily for 14 days. Patient not taking: Reported on 03/11/2024 12/11/23 12/25/23  Sherron Monday, MD  naproxen (NAPROSYN) 500 MG tablet Take 500 mg by mouth 2 (two) times daily as needed for mild pain. Patient not taking: Reported on 09/25/2023 02/22/23   [provider]  ondansetron (ZOFRAN-ODT) 4  MG disintegrating tablet Take 1 tablet (4 mg total) by mouth every 8 (eight) hours as needed for nausea or vomiting. Patient not taking: Reported on 09/25/2023 05/07/23   Sharman Cheek, MD  promethazine-dextromethorphan (PROMETHAZINE-DM) 6.25-15 MG/5ML syrup Take 5 mLs by mouth every 6 (six) hours as needed for cough. Patient not taking: Reported on 09/25/2023 03/26/23   [provider]     Critical care time: 35 minutes    Raechel Chute, MD [facility] 04/12/2024 9:22 AM

## 2024-04-18 NOTE — IPAL (Signed)
  Interdisciplinary Goals of Care Family Meeting   Date carried out: 04/06/2024  Location of the meeting: Bedside  Member's involved: Physician and Family Member or next of kin  Durable Power of Attorney or acting medical decision maker: Patient's wife and son.    Discussion: We discussed goals of care for Nathaniel Ibarra .  Discussed that [name] is critically ill and his condition decompensated overnight. He is currently with very low blood pressure and low heart rate. He is in the dying process and is actively dying. Family understand gravity of illness and wish to proceed with comfort measures.  Code status:   Code Status: Do not attempt resuscitation (DNR) - Comfort care   Disposition: In-patient comfort care  Time spent for the meeting: 10 minutes    Raechel Chute, MD  04/08/2024, 9:19 AM

## 2024-04-18 NOTE — Death Summary Note (Signed)
 DEATH SUMMARY   Patient Details  Name: Nathaniel Ibarra MRN: [id number] DOB: [birth year]  Admission/Discharge Information   Admit Date:  03/23/24  Date of Death: Date of Death: 04/06/24  Time of Death: Time of Death: 0914  Length of Stay: 04/14/2024  Referring Physician: Sherron Monday, MD   Reason(s) for Hospitalization  Respiratory Failure, ARDS  Diagnoses  Preliminary cause of death:  Secondary Diagnoses (including complications and co-morbidities):  Principal Problem:   Acute hypoxic respiratory failure (HCC) Active Problems:   Acute respiratory distress syndrome (ARDS) (HCC)   BPH (benign prostatic hyperplasia)   Hypothyroidism   Paroxysmal atrial fibrillation (HCC)   Depression   Community acquired pneumonia   Dyslipidemia   Multifocal pneumonia   Malnutrition of moderate degree   Influenza A   Pressure injury of skin   Brief Hospital Course (including significant findings, care, treatment, and services provided and events leading to death)   Nathaniel Ibarra is a 83 y.o. year old male who presented to the hospital on 3/24 with shortness of breath and respiratory failure with escalating oxygen requirements requiring intubation and mechanical ventilation.  He developed respiratory failure and ARDS secondary to influenza infection as well as superimposed bacterial infection.  He remained vented with worsening oxygenation and ventilation.  Multiple goals of care conversations were had, family were understanding of how critically ill he is.  He was DNR with discussions regarding comfort measures and withdrawal of care.  Today, patient's hemodynamics and oxygenation worsened, and he subsequently developed PEA arrest and passed away comfortably in the presence of his loved ones.   Hospital Course by system   Neuro: Toxic metabolic encephalopathy secondary to severe pneumonia as well as sedation needs in the setting of severe ARDS. Remains on analgosedation with fentanyl and  propofol. Goal RASS -3.             > comfort measures only CV: Has a history of HFpEF, with recent echocardiogram showing elevated PASP which could be secondary to increased PVR or heart failure. Maintained on 40 mg of IV furosemide daily with overall goal net even which he is maintaining. Also on amiodarone which he takes outpatient for atrial fibrillation.             > hypotensive and bradycardic, in the dying process             > comfort measures only Pulm: Acute hypoxic respiratory failure with severe ARDS secondary to influenza pneumonia and likely superimposed bacterial infection. Poor P:F ratio, without much improvement while in the prone position, with worsening oxygen requirements when back in supine position. Chest CT showed bilateral lower lobe consolidations, GGO's, and pneumomediastinum but no pneumothorax. Optimized PEEP and FiO2 to ARDS table, minimal improvement in prone position. Overall has poor prognosis and is unlikely to make any meaningful recovery from a pulmonary perspective. Wife is clear about not wanting tracheostomy tube placement. GI: Comfort measures only Renal: kidney function appears to be within normal though his BUN is significantly elevated. Endo: On levothyroxine for hypothyroidism. On ICU glycemic protocol. On methylprednisolone for ARDS. Hem/Onc: Enoxaparin subQ for DVT prophylaxis. Does not appear to be anticoagulated outpatient. ID: Influenza A infection with possible superimposed bacterial infection resulting in severe pneumonia. He has finished a course of antibiotics on admission, with cultures not isolating any organisms. Restarted broad spectrum antibiotics given lower lobe consolidations. Cultures remained negative.   Pertinent Labs and Studies  Significant Diagnostic Studies DG Abd 1 View Result Date: 03/20/2024  CLINICAL DATA:  Orogastric tube placement. EXAM: ABDOMEN - 1 VIEW COMPARISON:  None Available. FINDINGS: Distal tip of nasogastric tube is  seen in expected position of distal stomach. IMPRESSION: Nasogastric tube tip seen in expected position of distal stomach. Electronically Signed   By: Lupita Raider M.D.   On: 03/20/2024 10:22   DG Chest Port 1 View Result Date: 03/20/2024 CLINICAL DATA:  Intubation. EXAM: PORTABLE CHEST 1 VIEW COMPARISON:  March 19, 2024. FINDINGS: Stable cardiomediastinal silhouette. Endotracheal and nasogastric tubes are in grossly good position. Stable extensive subcutaneous emphysema is noted bilaterally. No definite pneumothorax is noted, although this may be obscured due to overlying subcutaneous emphysema. Probable pneumomediastinum is noted as well. Bilateral patchy atelectasis or infiltrates are noted. IMPRESSION: Stable support apparatus. Stable extensive subcutaneous emphysema is noted bilaterally as well as probable pneumomediastinum. Bilateral patchy airspace opacities are noted as well. Electronically Signed   By: Lupita Raider M.D.   On: 03/20/2024 10:21   DG Chest Port 1 View Result Date: 03/19/2024 CLINICAL DATA:  Intubation EXAM: PORTABLE CHEST 1 VIEW COMPARISON:  Chest x-ray 03/19/2024 FINDINGS: Endotracheal tube tip is 12 mm above the carina. Enteric tube extends below the diaphragm pneumomediastinum and extensive chest wall emphysema again noted. Heart is enlarged, unchanged. There are minimal patchy opacities in the left lung base. There is no definite pleural effusion or pneumothorax. No acute fractures are seen. IMPRESSION: 1. Endotracheal tube tip is 12 mm above the carina. 2. Pneumomediastinum and extensive chest wall emphysema again noted. 3. Minimal patchy opacities in the left lung base. Electronically Signed   By: Darliss Cheney M.D.   On: 03/19/2024 17:44   DG Chest Port 1 View Result Date: 03/19/2024 CLINICAL DATA:  83 year old male with respiratory failure, pneumomediastinum, subcutaneous emphysema. EXAM: PORTABLE CHEST 1 VIEW COMPARISON:  Chest CT yesterday and earlier. FINDINGS: Portable  AP view at 0504 hours. Stable endotracheal and enteric tubes. Mildly larger lung volumes from the portable chest yesterday. Diffuse chest and lower neck subcutaneous emphysema redemonstrated. Pneumomediastinum again evident. As on the CT yesterday no pneumothorax is identified. No mediastinal shift. Stable cardiac size and mediastinal contours. Bilateral lower lobe consolidation better demonstrated by CT. Ventilation appears stable. Paucity of bowel gas. Stable visualized osseous structures. IMPRESSION: 1. Stable endotracheal and enteric tubes. 2. Ventilation not significantly changed from CT and radiographs yesterday. Bilateral lower lobe consolidation better demonstrated by CT. Otherwise stable pneumomediastinum, subcutaneous emphysema. No pneumothorax or mediastinal shift identified. 3. Ongoing extensive subcutaneous emphysema. Pneumomediastinum as demonstrated by CT. No pneumothorax identified. Electronically Signed   By: Odessa Fleming M.D.   On: 03/19/2024 07:56   CT CHEST WO CONTRAST Result Date: 03/18/2024 CLINICAL DATA:  Respiratory illness EXAM: CT CHEST WITHOUT CONTRAST TECHNIQUE: Multidetector CT imaging of the chest was performed following the standard protocol without IV contrast. RADIATION DOSE REDUCTION: This exam was performed according to the departmental dose-optimization program which includes automated exposure control, adjustment of the mA and/or kV according to patient size and/or use of iterative reconstruction technique. COMPARISON:  CT angiogram chest 03/14/2024. FINDINGS: Cardiovascular: The heart is moderately enlarged, unchanged. Aorta is normal in size. There are atherosclerotic calcifications of the aorta and coronary arteries. Endotracheal tube tip is 2.9 cm above the carina. Mediastinum/Nodes: There is diffuse new moderate severe pneumomediastinum. No enlarged mediastinal or axillary lymph nodes. Thyroid gland, trachea, and esophagus demonstrate no significant findings. Enteric tube is  seen throughout the esophagus. Lungs/Pleura: Bilateral lower lobe consolidation with air bronchograms has mildly  increased. Additional patchy interstitial and ground-glass opacities in the bilateral upper lobes has slightly decreased. There is no pleural effusion or pneumothorax. Upper Abdomen: Small gallstones are present. Enteric tube tip ends in the gastric antrum. Colonic diverticulosis is present. Musculoskeletal: There is new diffuse marked chest wall emphysema. No acute osseous abnormality. IMPRESSION: 1. New diffuse moderate to severe pneumomediastinum and marked chest wall emphysema. 2. Increasing bilateral lower lobe consolidation. Decreasing additional patchy interstitial and ground-glass opacities in the bilateral upper lobes has slightly decreased. Findings are compatible with multifocal pneumonia. 3. Stable cardiomegaly. 4. Cholelithiasis. 5. Colonic diverticulosis. Aortic Atherosclerosis (ICD10-I70.0). Electronically Signed   By: Darliss Cheney M.D.   On: 03/18/2024 17:21   DG Chest Port 1 View Result Date: 03/18/2024 CLINICAL DATA:  Respiratory failure with hypoxia. EXAM: PORTABLE CHEST 1 VIEW COMPARISON:  Radiographs 03/17/2024 and 03/14/2024.  CT 03/14/2024. FINDINGS: 0906 hours. The endotracheal and enteric tubes are unchanged in position. The heart size and mediastinal contours are stable. Severe soft tissue emphysema is again noted throughout the chest and neck soft tissues. Allowing for this, no definite pneumothorax. Diffuse bilateral pulmonary opacities are unchanged. No significant pleural effusion. IMPRESSION: 1. Stable diffuse bilateral pulmonary opacities. 2. Stable severe soft tissue emphysema. No definite pneumothorax or significant change from yesterday. Electronically Signed   By: Carey Bullocks M.D.   On: 03/18/2024 10:30   DG Chest Port 1 View Result Date: 03/17/2024 CLINICAL DATA:  [id number] Acute and chronic respiratory failure with hypoxia [facility]) [id number] EXAM: PORTABLE CHEST 1  VIEW COMPARISON:  March 14, 2024 FINDINGS: The cardiomediastinal silhouette is unchanged in contour.ETT tip terminates 3.5 cm above the carina. The enteric tube courses through the chest to the abdomen beyond the field-of-view. No pleural effusion. Extensive subcutaneous air limits evaluation for a discrete pneumothorax. Extent of subcutaneous emphysema is increased since prior. There is a possible trace LEFT pneumothorax. There is favored pneumopericardium and pneumomediastinum. Atherosclerotic calcifications. Bibasilar heterogeneous opacities IMPRESSION: 1. Extensive subcutaneous emphysema limits evaluation for a discrete pneumothorax. There is a possible trace LEFT pneumothorax. Overall extent of subcutaneous emphysema has increased since prior. This could be due to barotrauma, air-leak or other indeterminate etiology. 2. There is favored pneumopericardium and pneumomediastinum. 3. Bibasilar heterogeneous opacities, grossly similar. These results will be called to the ordering clinician or representative by the Radiologist Assistant, and communication documented in the PACS or [employer]. Electronically Signed   By: Meda Klinefelter M.D.   On: 03/17/2024 13:07   DG Chest Port 1 View Result Date: 03/14/2024 CLINICAL DATA:  [id number] Acute on chronic respiratory failure with hypoxia (HCC) [id number] EXAM: PORTABLE CHEST 1 VIEW COMPARISON:  CT chest 03/14/2024, chest x-ray 03/13/2024. FINDINGS: Endotracheal tube with tip terminating 2.5 cm above the carina. Enteric tube courses below the hemidiaphragm with tip and side port collimated off view. The heart and mediastinal contours are unchanged. Low lung volumes with persistent bilateral airspace and interstitial markings. Bilateral trace to small volume pleural effusions. No pneumothorax. No acute osseous abnormality. Interval development of right neck and left chest wall emphysematous changes. IMPRESSION: 1. Interval development of right neck and left chest  wall emphysematous changes. Indeterminate etiology. 2. Low lung volumes with persistent bilateral airspace and interstitial markings. Bilateral trace to small volume pleural effusions. 3. Lines and tubes as above. Electronically Signed   By: Tish Frederickson M.D.   On: 03/14/2024 22:43   CT Angio Chest Pulmonary Embolism (PE) W or WO Contrast Result Date: 03/14/2024 CLINICAL DATA:  Pulmonary embolism suspected  EXAM: CT ANGIOGRAPHY CHEST WITH CONTRAST TECHNIQUE: Multidetector CT imaging of the chest was performed using the standard protocol during bolus administration of intravenous contrast. Multiplanar CT image reconstructions and MIPs were obtained to evaluate the vascular anatomy. RADIATION DOSE REDUCTION: This exam was performed according to the departmental dose-optimization program which includes automated exposure control, adjustment of the mA and/or kV according to patient size and/or use of iterative reconstruction technique. CONTRAST:  75mL OMNIPAQUE IOHEXOL 350 MG/ML SOLN COMPARISON:  10/16/2023 FINDINGS: Cardiovascular: Suboptimal but satisfactory opacification of the pulmonary arteries to the segmental level. No evidence of pulmonary embolism, especially limited at the lung bases due to motion artifact. Enlarged heart size. No pericardial effusion. Extensive atheromatous calcification of the aorta and coronaries. Mediastinum/Nodes: Negative for mass or adenopathy. Unremarkable enteric tube traversing the esophagus Lungs/Pleura: Patchy ground-glass and consolidative opacities affecting all lobes. There are bands of opacity and volume loss at the lung bases where some traction bronchiectasis is chronically seen, likely atelectasis superimposed on chronic interstitial lung disease. Unremarkable positioning of endotracheal tube above the carina. Upper Abdomen: No acute finding Musculoskeletal: No acute finding Review of the MIP images confirms the above findings. IMPRESSION: Multifocal pneumonia. 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 Low volume lungs with multi segment atelectasis at the bases. Negative for pulmonary embolism with significant limitation especially due to motion. Extensive atherosclerosis. Electronically Signed   By: Tiburcio Pea M.D.   On: 03/14/2024 10:35   ECHOCARDIOGRAM COMPLETE Result Date: 03/13/2024    ECHOCARDIOGRAM REPORT   Patient Name:   Nathaniel Ibarra [facility] Date of Exam: 03/12/2024 Medical Rec #:  [id number]         Height:       72.0 in Accession #:    [id number]        Weight:       193.8 lb Date of Birth:  Jul 12, 1941         BSA:          2.102 m Patient Age:    82 years          BP:           112/51 mmHg Patient Gender: M                 HR:           74 bpm. Exam Location:  [facility] Procedure: 2D Echo, Cardiac Doppler and Color Doppler (Both Spectral and Color            Flow Doppler were utilized during procedure). Indications:     R06.03 Acute Respiratory Distress  History:         Patient has prior history of Echocardiogram examinations, most                  recent 05/17/2020. Cardiomyopathy, CAD and NSTEMI.,                  Arrythmias:LBBB and Paroxysmal Atrial Fibrillation.; Risk                  Factors:Hypertension and Dyslipidemia.  Sonographer:     Daphine Deutscher RDCS Referring Phys:  [id number] Ezequiel Essex Diagnosing Phys: Julien Nordmann MD IMPRESSIONS  1. Left ventricular ejection fraction, by estimation, is 50 to 55%. Left ventricular ejection fraction by PLAX is 54 %. The left ventricle has low normal function. The left ventricle has no regional wall motion abnormalities. There is mild left ventricular  hypertrophy. Left ventricular diastolic parameters are indeterminate.  2. Right ventricular systolic function is normal. The right ventricular size is normal. There is mildly elevated pulmonary artery systolic pressure. The estimated right ventricular systolic pressure is 43.7 mmHg.  3. The mitral valve is  normal in structure. Mild mitral valve regurgitation. No evidence of mitral stenosis.  4. The aortic valve is normal in structure. Aortic valve regurgitation is not visualized. No aortic stenosis is present.  5. The inferior vena cava is normal in size with greater than 50% respiratory variability, suggesting right atrial pressure of 3 mmHg. FINDINGS  Left Ventricle: Left ventricular ejection fraction, by estimation, is 50 to 55%. Left ventricular ejection fraction by PLAX is 54 %. The left ventricle has low normal function. The left ventricle has no regional wall motion abnormalities. Strain was performed and the global longitudinal strain is indeterminate. The left ventricular internal cavity size was normal in size. There is mild left ventricular hypertrophy. Left ventricular diastolic parameters are indeterminate. Right Ventricle: The right ventricular size is normal. No increase in right ventricular wall thickness. Right ventricular systolic function is normal. There is mildly elevated pulmonary artery systolic pressure. The tricuspid regurgitant velocity is 3.11  m/s, and with an assumed right atrial pressure of 5 mmHg, the estimated right ventricular systolic pressure is 43.7 mmHg. Left Atrium: Left atrial size was normal in size. Right Atrium: Right atrial size was normal in size. Pericardium: There is no evidence of pericardial effusion. Mitral Valve: The mitral valve is normal in structure. Mild mitral valve regurgitation. No evidence of mitral valve stenosis. Tricuspid Valve: The tricuspid valve is normal in structure. Tricuspid valve regurgitation is mild . No evidence of tricuspid stenosis. Aortic Valve: The aortic valve is normal in structure. Aortic valve regurgitation is not visualized. No aortic stenosis is present. Pulmonic Valve: The pulmonic valve was normal in structure. Pulmonic valve regurgitation is mild. No evidence of pulmonic stenosis. Aorta: The aortic root is normal in size and  structure. Venous: The inferior vena cava is normal in size with greater than 50% respiratory variability, suggesting right atrial pressure of 3 mmHg. IAS/Shunts: No atrial level shunt detected by color flow Doppler. Additional Comments: 3D was performed not requiring image post processing on an independent workstation and was indeterminate.  LEFT VENTRICLE PLAX 2D LV EF:         Left            Diastology                ventricular     LV e' medial:    5.01 cm/s                ejection        LV E/e' medial:  11.9                fraction by     LV e' lateral:   3.59 cm/s                PLAX is 54      LV E/e' lateral: 16.6                %. LVIDd:         4.30 cm LVIDs:         3.10 cm LV PW:         1.40 cm LV IVS:        1.10 cm LVOT diam:     2.30 cm LV  SV:         70 LV SV Index:   34 LVOT Area:     4.15 cm  RIGHT VENTRICLE             IVC RV Basal diam:  3.20 cm     IVC diam: 2.30 cm RV S prime:     10.07 cm/s TAPSE (M-mode): 2.2 cm LEFT ATRIUM             Index        RIGHT ATRIUM           Index LA diam:        5.20 cm 2.47 cm/m   RA Area:     12.60 cm LA Vol (A2C):   26.0 ml 12.37 ml/m  RA Volume:   30.60 ml  14.56 ml/m LA Vol (A4C):   32.8 ml 15.60 ml/m LA Biplane Vol: 31.1 ml 14.79 ml/m  AORTIC VALVE LVOT Vmax:   88.55 cm/s LVOT Vmean:  53.450 cm/s LVOT VTI:    0.169 m  AORTA Ao Root diam: 3.40 cm MITRAL VALVE               TRICUSPID VALVE MV Area (PHT): 2.53 cm    TR Peak grad:   38.7 mmHg MV Decel Time: 300 msec    TR Vmax:        311.00 cm/s MV E velocity: 59.55 cm/s MV A velocity: 63.00 cm/s  SHUNTS MV E/A ratio:  0.95        Systemic VTI:  0.17 m                            Systemic Diam: 2.30 cm Julien Nordmann MD Electronically signed by Julien Nordmann MD Signature Date/Time: 03/13/2024/10:23:04 AM    Final    DG Chest 1 View Result Date: 03/13/2024 CLINICAL DATA:  Pneumonia.  Hypoxia. EXAM: CHEST  1 VIEW COMPARISON:  03/12/2024 FINDINGS: Endotracheal tube is 3.0 cm above the carina.  Nasogastric tube extends into the abdomen but the tip is beyond the image. Patchy lung densities have minimally changed. Again noted is elevation of the right hemidiaphragm. Difficult to exclude right basilar atelectasis or pleural fluid. Negative for a pneumothorax. IMPRESSION: 1. No significant change in the patchy lung densities. 2. Elevation of the right hemidiaphragm. Electronically Signed   By: Richarda Overlie M.D.   On: 03/13/2024 09:40   DG Chest Port 1 View Result Date: 03/12/2024 CLINICAL DATA:  [id number] Endotracheally intubated [id number]; [id number] Encounter for orogastric (OG) tube placement [id number] EXAM: PORTABLE CHEST and abdomen 1 VIEW COMPARISON:  Chest x-ray 03/12/2024 10:28 p.m., 03/10/2024 chest x-ray FINDINGS: Endotracheal tube terminates 4 cm above the carina. Enteric tube courses below the hemidiaphragm with tip and side port overlying the expected region of the gastric lumen. Atherosclerotic plaque. The heart and mediastinal contours are within normal limits. Diffuse patchy airspace opacities. No focal consolidation. No pulmonary edema. Likely bilateral trace pleural effusion. No pneumothorax. Nonobstructive bowel gas pattern. No acute osseous abnormality. IMPRESSION: 1. Endotracheal and enteric tubes in appropriate position. 2. Multifocal pneumonia. Followup PA and lateral chest X-ray is recommended in 3-4 weeks following therapy to ensure resolution. 3.  Likely bilateral trace pleural effusions. 4. Nonobstructive bowel gas pattern. 5.  Aortic Atherosclerosis (ICD10-I70.0). Electronically Signed   By: Tish Frederickson M.D.   On: 03/12/2024 22:33   DG Abd 1 View Result Date: 03/12/2024 CLINICAL DATA:  [id number] Endotracheally  intubated [id number]; [id number] Encounter for orogastric (OG) tube placement [id number] EXAM: PORTABLE CHEST and abdomen 1 VIEW COMPARISON:  Chest x-ray 03/12/2024 10:28 p.m., 03/10/2024 chest x-ray FINDINGS: Endotracheal tube terminates 4 cm above the carina. Enteric tube courses below the  hemidiaphragm with tip and side port overlying the expected region of the gastric lumen. Atherosclerotic plaque. The heart and mediastinal contours are within normal limits. Diffuse patchy airspace opacities. No focal consolidation. No pulmonary edema. Likely bilateral trace pleural effusion. No pneumothorax. Nonobstructive bowel gas pattern. No acute osseous abnormality. IMPRESSION: 1. Endotracheal and enteric tubes in appropriate position. 2. Multifocal pneumonia. Followup PA and lateral chest X-ray is recommended in 3-4 weeks following therapy to ensure resolution. 3.  Likely bilateral trace pleural effusions. 4. Nonobstructive bowel gas pattern. 5.  Aortic Atherosclerosis (ICD10-I70.0). Electronically Signed   By: Tish Frederickson M.D.   On: 03/12/2024 22:33   DG Chest Port 1 View Result Date: 03/12/2024 CLINICAL DATA:  Acute respiratory distress EXAM: PORTABLE CHEST 1 VIEW COMPARISON:  03/10/2024 FINDINGS: Low volume chest with patchy indistinct density in the bilateral lungs that is progressed especially in the upper lung zones. Enlarged heart size accentuated by mediastinal fat. No significant pleural fluid and no pneumothorax. IMPRESSION: Low volume chest with progressive bilateral airspace disease favoring pneumonia. Electronically Signed   By: Tiburcio Pea M.D.   On: 03/12/2024 06:20   DG Chest Portable 1 View Result Date: 03/10/2024 CLINICAL DATA:  Shortness of breath, flu positive x 1 week, new hypoxia, evaluating for secondary pneumonia EXAM: PORTABLE CHEST 1 VIEW COMPARISON:  Chest x-ray 03/29/2023 FINDINGS: The heart and mediastinal contours are unchanged. Atherosclerotic plaque. Low lung volumes. Question developing right mid lung zone and left mid to lower lung zone airspace opacities. Chronic coarsened interstitial markings with no overt pulmonary edema. No pleural effusion. No pneumothorax. No acute osseous abnormality. IMPRESSION: 1. Low lung volumes with bronchitic changes and question  developing right mid lung zone and left mid to lower lung zone airspace opacities. Followup PA and lateral chest X-ray is recommended in 3-4 weeks following therapy to ensure resolution. 2.  Aortic Atherosclerosis (ICD10-I70.0). Electronically Signed   By: Tish Frederickson M.D.   On: 03/10/2024 21:25    Microbiology No results found for this or any previous visit (from the past 240 hours).  Lab Basic Metabolic Panel: Recent Labs  Lab 03/18/24 0305 03/19/24 0440 03/20/24 0332 03/21/24 0515 03/22/24 0419 03/23/24 0403  NA 141 144 146* 146* 145 146*  K 5.5* 5.4* 4.8 4.7 4.5 4.5  CL 97* 98 97* 96* 95* 96*  CO2 35* 38* 38* 39* 40* 41*  GLUCOSE 209* 210* 281* 195* 215* 226*  BUN 112* 126* 135* 170* 176* 200*  CREATININE 0.94 1.03 1.08 1.23 1.25* 1.28*  CALCIUM 8.2* 8.3* 8.1* 7.9* 8.2* 7.7*  MG 3.8*  --  3.7* 3.9* 4.0*  --   PHOS 5.2* 4.8* 4.3 3.8 5.1* 4.4   Liver Function Tests: Recent Labs  Lab 03/19/24 0440 03/20/24 0332 03/21/24 0515 03/22/24 0419 03/23/24 0403  ALBUMIN 2.3* 2.1* 2.2* 2.3* 2.1*   No results for input(s): "LIPASE", "AMYLASE" in the last 168 hours. No results for input(s): "AMMONIA" in the last 168 hours. CBC: Recent Labs  Lab 03/19/24 0440 03/20/24 0332 03/21/24 0515 03/22/24 0419 03/23/24 0403  WBC 33.3* 28.7* 30.5* 37.0* 35.7*  HGB 13.3 12.6* 12.2* 13.0 11.8*  HCT 42.1 40.6 38.7* 41.5 37.9*  MCV 101.0* 102.8* 100.5* 102.2* 102.4*  PLT 411* 342 340 372 285  Cardiac Enzymes: No results for input(s): "CKTOTAL", "CKMB", "CKMBINDEX", "TROPONINI" in the last 168 hours. Sepsis Labs: Recent Labs  Lab 03/18/24 1728 03/19/24 0440 03/20/24 0332 03/21/24 0515 03/22/24 0419 03/23/24 0403  PROCALCITON 0.11  --   --   --   --   --   WBC  --    < > 28.7* 30.5* 37.0* 35.7*   < > = values in this interval not displayed.     Nestor Wieneke 03/26/2024, 9:23 AM

## 2024-04-18 NOTE — Progress Notes (Signed)
 Extubation order written and ET tube was removed after time of death had been called.

## 2024-04-18 NOTE — Progress Notes (Signed)
 Daily Progress Note   Patient Name: GARRETT MITCHUM       Date: 04/02/2024 DOB: [birth year]  Age: 83 y.o. MRN#: [id number] Attending Physician: No att. providers found Primary Care Physician: Sherron Monday, MD Admit Date: 03/10/2024  Reason for Consultation/Follow-up: Establishing goals of care  HPI/Brief Hospital Review: 83 y.o. male  with past medical history of HFrEF, cardiomyopathy, BPH, HTN, HLD, CAD and hypothyroidism admitted from home on 03/10/2024 with worsening dyspnea, cough and wheezing over the last week. Diagnosed with influenza and PNA about 1 week prior, completed course of Tamiflu and antibiotics.   Found to be hypoxic, admitted and being treated for acute hypoxic respiratory failure and CAP RRT called 3/25 due to worsening hypoxia-required intubation and transferred to ICU   4/4 remains intubated, sedation weaned off overnight, not following commands, oxygen requirements increased overnight  4/5 remains intubated and off sedation, unresponsive, respiratory status remains tenuous   Palliative medicine was consulted for assisting with goals of care conversations.  Subjective: Extensive chart review has been completed prior to meeting patient including labs, vital signs, imaging, progress notes, orders, and available advanced directive documents from current and previous encounters.    Called to bedside this AM by nursing staff due to unstable cardiac rhythm. On arrival, Mr. Hubbert bradycardic into 30's, significant hypotension--family called in by nursing staff.  Sat with Mr. Kriesel until family arrived. Explained to family Mr. Sales in the active transition phase. We again discussed transition to comfort care allowing for peaceful and dignified passing. Family  stated they were thankful he was going to pass "naturally without having to make a decision to liberate from ventilator." Family attempting to contact granddaughter. Family decline chaplain visit. Allowed family time to visit with Mr. [employer].  Notified by nursing staff of Mr. Stretch's passing.  Returned to bedside and offered emotional support to wife and son.  Thank you for allowing the Palliative Medicine Team to assist in the care of this patient.  Total time:  65 minutes  Time spent includes: Detailed review of medical records (labs, imaging, vital signs), medically appropriate exam (mental status, respiratory, cardiac, skin), discussed with treatment team, counseling and educating patient, family and staff, documenting clinical information, medication management and coordination of care.  Leeanne Deed, DNP, AGNP-C Palliative Medicine   Please contact Palliative  Medicine Team phone at [phone] for questions and concerns.

## 2024-04-18 DEATH — deceased

## 2024-08-08 ENCOUNTER — Ambulatory Visit: Payer: PPO | Admitting: Dermatology
